# Patient Record
Sex: Male | Born: 1960 | Race: White | Hispanic: No | Smoking: Current every day smoker
Health system: Southern US, Community
[De-identification: ages and names within clinical notes are randomized; demographics above are authoritative.]

## PROBLEM LIST (undated history)

## (undated) DIAGNOSIS — G8929 Other chronic pain: Secondary | ICD-10-CM

## (undated) DIAGNOSIS — F419 Anxiety disorder, unspecified: Secondary | ICD-10-CM

## (undated) DIAGNOSIS — F4310 Post-traumatic stress disorder, unspecified: Secondary | ICD-10-CM

## (undated) DIAGNOSIS — F1290 Cannabis use, unspecified, uncomplicated: Secondary | ICD-10-CM

## (undated) DIAGNOSIS — Z9181 History of falling: Secondary | ICD-10-CM

## (undated) DIAGNOSIS — J439 Emphysema, unspecified: Secondary | ICD-10-CM

## (undated) DIAGNOSIS — R911 Solitary pulmonary nodule: Secondary | ICD-10-CM

## (undated) DIAGNOSIS — E785 Hyperlipidemia, unspecified: Secondary | ICD-10-CM

## (undated) DIAGNOSIS — N400 Enlarged prostate without lower urinary tract symptoms: Secondary | ICD-10-CM

## (undated) DIAGNOSIS — I5189 Other ill-defined heart diseases: Secondary | ICD-10-CM

## (undated) DIAGNOSIS — Z720 Tobacco use: Secondary | ICD-10-CM

## (undated) DIAGNOSIS — Z79891 Long term (current) use of opiate analgesic: Secondary | ICD-10-CM

## (undated) DIAGNOSIS — T1491XA Suicide attempt, initial encounter: Secondary | ICD-10-CM

## (undated) DIAGNOSIS — I10 Essential (primary) hypertension: Secondary | ICD-10-CM

## (undated) DIAGNOSIS — N179 Acute kidney failure, unspecified: Secondary | ICD-10-CM

## (undated) DIAGNOSIS — M549 Dorsalgia, unspecified: Secondary | ICD-10-CM

## (undated) DIAGNOSIS — C801 Malignant (primary) neoplasm, unspecified: Secondary | ICD-10-CM

## (undated) DIAGNOSIS — F32A Depression, unspecified: Secondary | ICD-10-CM

## (undated) DIAGNOSIS — I700 Atherosclerosis of aorta: Secondary | ICD-10-CM

## (undated) DIAGNOSIS — E2740 Unspecified adrenocortical insufficiency: Secondary | ICD-10-CM

## (undated) DIAGNOSIS — K649 Unspecified hemorrhoids: Secondary | ICD-10-CM

## (undated) DIAGNOSIS — E876 Hypokalemia: Secondary | ICD-10-CM

## (undated) DIAGNOSIS — K219 Gastro-esophageal reflux disease without esophagitis: Secondary | ICD-10-CM

## (undated) DIAGNOSIS — I6789 Other cerebrovascular disease: Secondary | ICD-10-CM

## (undated) DIAGNOSIS — F6381 Intermittent explosive disorder: Secondary | ICD-10-CM

## (undated) DIAGNOSIS — F329 Major depressive disorder, single episode, unspecified: Secondary | ICD-10-CM

## (undated) DIAGNOSIS — I779 Disorder of arteries and arterioles, unspecified: Secondary | ICD-10-CM

## (undated) HISTORY — PX: CHEST TUBE INSERTION: SHX231

## (undated) HISTORY — PX: LYMPH NODE BIOPSY: SHX201

## (undated) HISTORY — PX: OTHER SURGICAL HISTORY: SHX169

---

## 2006-03-07 ENCOUNTER — Emergency Department: Payer: Self-pay | Admitting: Emergency Medicine

## 2009-03-19 ENCOUNTER — Ambulatory Visit: Payer: Self-pay | Admitting: Internal Medicine

## 2009-05-12 ENCOUNTER — Emergency Department: Payer: Self-pay | Admitting: Emergency Medicine

## 2009-05-19 ENCOUNTER — Emergency Department: Payer: Self-pay | Admitting: Unknown Physician Specialty

## 2009-06-11 ENCOUNTER — Emergency Department: Payer: Self-pay | Admitting: Emergency Medicine

## 2010-09-01 ENCOUNTER — Inpatient Hospital Stay: Payer: Self-pay | Admitting: Internal Medicine

## 2010-09-09 ENCOUNTER — Emergency Department: Payer: Self-pay

## 2011-07-16 ENCOUNTER — Emergency Department: Payer: Self-pay

## 2011-07-16 LAB — COMPREHENSIVE METABOLIC PANEL
Albumin: 4.4 g/dL (ref 3.4–5.0)
Alkaline Phosphatase: 104 U/L (ref 50–136)
BUN: 10 mg/dL (ref 7–18)
Bilirubin,Total: 0.5 mg/dL (ref 0.2–1.0)
Calcium, Total: 9.5 mg/dL (ref 8.5–10.1)
Chloride: 103 mmol/L (ref 98–107)
Co2: 28 mmol/L (ref 21–32)
EGFR (African American): >60
EGFR (Non-African Amer.): >60
Osmolality: 285 (ref 275–301)
SGPT (ALT): 20 U/L
Total Protein: 7.9 g/dL (ref 6.4–8.2)

## 2011-07-16 LAB — CBC
HGB: 15.5 g/dL (ref 13.0–18.0)
MCH: 29.7 pg (ref 26.0–34.0)
MCHC: 33.2 g/dL (ref 32.0–36.0)
Platelet: 305 10*3/uL (ref 150–440)
RBC: 5.21 10*6/uL (ref 4.40–5.90)
RDW: 14.6 % — ABNORMAL HIGH (ref 11.5–14.5)
WBC: 10 10*3/uL (ref 3.8–10.6)

## 2011-07-16 LAB — LIPASE, BLOOD: Lipase: 267 U/L (ref 73–393)

## 2011-07-16 LAB — URINALYSIS, COMPLETE
Blood: NEGATIVE
Nitrite: NEGATIVE
Ph: 9 (ref 4.5–8.0)
Specific Gravity: 1.024 (ref 1.003–1.030)
WBC UR: NONE SEEN /HPF (ref 0–5)

## 2011-08-15 ENCOUNTER — Emergency Department: Payer: Self-pay | Admitting: Emergency Medicine

## 2013-09-30 ENCOUNTER — Emergency Department: Payer: Self-pay | Admitting: Emergency Medicine

## 2013-10-05 ENCOUNTER — Emergency Department: Payer: Self-pay | Admitting: Emergency Medicine

## 2013-10-27 ENCOUNTER — Emergency Department (HOSPITAL_COMMUNITY): Payer: Self-pay

## 2013-10-27 ENCOUNTER — Emergency Department (HOSPITAL_COMMUNITY)
Admission: EM | Admit: 2013-10-27 | Discharge: 2013-10-27 | Disposition: A | Discharge status: Home / Self Care | Payer: Self-pay | Attending: Emergency Medicine | Admitting: Emergency Medicine

## 2013-10-27 ENCOUNTER — Encounter (HOSPITAL_COMMUNITY): Payer: Self-pay | Admitting: Emergency Medicine

## 2013-10-27 DIAGNOSIS — W19XXXA Unspecified fall, initial encounter: Secondary | ICD-10-CM

## 2013-10-27 DIAGNOSIS — S4980XA Other specified injuries of shoulder and upper arm, unspecified arm, initial encounter: Secondary | ICD-10-CM | POA: Insufficient documentation

## 2013-10-27 DIAGNOSIS — W1789XA Other fall from one level to another, initial encounter: Secondary | ICD-10-CM | POA: Insufficient documentation

## 2013-10-27 DIAGNOSIS — S161XXA Strain of muscle, fascia and tendon at neck level, initial encounter: Secondary | ICD-10-CM

## 2013-10-27 DIAGNOSIS — IMO0002 Reserved for concepts with insufficient information to code with codable children: Secondary | ICD-10-CM | POA: Insufficient documentation

## 2013-10-27 DIAGNOSIS — S139XXA Sprain of joints and ligaments of unspecified parts of neck, initial encounter: Secondary | ICD-10-CM | POA: Insufficient documentation

## 2013-10-27 DIAGNOSIS — Z79899 Other long term (current) drug therapy: Secondary | ICD-10-CM | POA: Insufficient documentation

## 2013-10-27 DIAGNOSIS — Y93H2 Activity, gardening and landscaping: Secondary | ICD-10-CM | POA: Insufficient documentation

## 2013-10-27 DIAGNOSIS — S298XXA Other specified injuries of thorax, initial encounter: Secondary | ICD-10-CM | POA: Insufficient documentation

## 2013-10-27 DIAGNOSIS — F17200 Nicotine dependence, unspecified, uncomplicated: Secondary | ICD-10-CM | POA: Insufficient documentation

## 2013-10-27 DIAGNOSIS — M79601 Pain in right arm: Secondary | ICD-10-CM

## 2013-10-27 DIAGNOSIS — S46909A Unspecified injury of unspecified muscle, fascia and tendon at shoulder and upper arm level, unspecified arm, initial encounter: Secondary | ICD-10-CM | POA: Insufficient documentation

## 2013-10-27 DIAGNOSIS — I10 Essential (primary) hypertension: Secondary | ICD-10-CM | POA: Insufficient documentation

## 2013-10-27 DIAGNOSIS — Y9289 Other specified places as the place of occurrence of the external cause: Secondary | ICD-10-CM | POA: Insufficient documentation

## 2013-10-27 DIAGNOSIS — S335XXA Sprain of ligaments of lumbar spine, initial encounter: Secondary | ICD-10-CM | POA: Insufficient documentation

## 2013-10-27 DIAGNOSIS — S39012A Strain of muscle, fascia and tendon of lower back, initial encounter: Secondary | ICD-10-CM

## 2013-10-27 HISTORY — DX: Essential (primary) hypertension: I10

## 2013-10-27 LAB — URINALYSIS, ROUTINE W REFLEX MICROSCOPIC
BILIRUBIN URINE: NEGATIVE
GLUCOSE, UA: NEGATIVE mg/dL
HGB URINE DIPSTICK: NEGATIVE
Ketones, ur: NEGATIVE mg/dL
Leukocytes, UA: NEGATIVE
Nitrite: NEGATIVE
Protein, ur: NEGATIVE mg/dL
SPECIFIC GRAVITY, URINE: 1.026 (ref 1.005–1.030)
UROBILINOGEN UA: 0.2 mg/dL (ref 0.0–1.0)
pH: 7 (ref 5.0–8.0)

## 2013-10-27 LAB — ETHANOL: Alcohol, Ethyl (B): <11 mg/dL (ref 0–11)

## 2013-10-27 LAB — COMPREHENSIVE METABOLIC PANEL
ALK PHOS: 100 U/L (ref 39–117)
ALT: 15 U/L (ref 0–53)
AST: 23 U/L (ref 0–37)
Albumin: 3.7 g/dL (ref 3.5–5.2)
Anion gap: 14 (ref 5–15)
BUN: 13 mg/dL (ref 6–23)
CHLORIDE: 103 meq/L (ref 96–112)
CO2: 26 mEq/L (ref 19–32)
CREATININE: 0.87 mg/dL (ref 0.50–1.35)
Calcium: 9.2 mg/dL (ref 8.4–10.5)
GFR calc Af Amer: >90 mL/min (ref >90)
Glucose, Bld: 107 mg/dL — ABNORMAL HIGH (ref 70–99)
Potassium: 2.9 mEq/L — CL (ref 3.7–5.3)
Sodium: 143 mEq/L (ref 137–147)
Total Bilirubin: 0.5 mg/dL (ref 0.3–1.2)
Total Protein: 6.7 g/dL (ref 6.0–8.3)

## 2013-10-27 LAB — TROPONIN I

## 2013-10-27 LAB — CBC WITH DIFFERENTIAL/PLATELET
Basophils Absolute: 0 10*3/uL (ref 0.0–0.1)
Basophils Relative: 0 % (ref 0–1)
Eosinophils Absolute: 0.1 10*3/uL (ref 0.0–0.7)
Eosinophils Relative: 1 % (ref 0–5)
HEMATOCRIT: 42.6 % (ref 39.0–52.0)
HEMOGLOBIN: 15.1 g/dL (ref 13.0–17.0)
Lymphocytes Relative: 25 % (ref 12–46)
Lymphs Abs: 1.6 10*3/uL (ref 0.7–4.0)
MCH: 29 pg (ref 26.0–34.0)
MCHC: 35.4 g/dL (ref 30.0–36.0)
MCV: 81.9 fL (ref 78.0–100.0)
MONO ABS: 0.5 10*3/uL (ref 0.1–1.0)
MONOS PCT: 8 % (ref 3–12)
NEUTROS ABS: 4.1 10*3/uL (ref 1.7–7.7)
Neutrophils Relative %: 66 % (ref 43–77)
Platelets: 287 10*3/uL (ref 150–400)
RBC: 5.2 MIL/uL (ref 4.22–5.81)
RDW: 13.5 % (ref 11.5–15.5)
WBC: 6.3 10*3/uL (ref 4.0–10.5)

## 2013-10-27 LAB — MAGNESIUM: Magnesium: 1.9 mg/dL (ref 1.5–2.5)

## 2013-10-27 MED ORDER — POTASSIUM CHLORIDE CRYS ER 20 MEQ PO TBCR: 20.0000 meq | EXTENDED_RELEASE_TABLET | Freq: Every day | ORAL | Status: DC

## 2013-10-27 MED ORDER — MORPHINE SULFATE 4 MG/ML IJ SOLN
4.0000 mg | Freq: Once | INTRAMUSCULAR | Status: AC
  Administered 2013-10-27: 4 mg via INTRAVENOUS
  Filled 2013-10-27: qty 1

## 2013-10-27 MED ORDER — SODIUM CHLORIDE 0.9 % IV BOLUS (SEPSIS)
1000.0000 mL | Freq: Once | INTRAVENOUS | Status: AC
  Administered 2013-10-27: 1000 mL via INTRAVENOUS

## 2013-10-27 MED ORDER — OXYCODONE-ACETAMINOPHEN 5-325 MG PO TABS: 1.0000 | ORAL_TABLET | Freq: Three times a day (TID) | ORAL | Status: DC | PRN

## 2013-10-27 MED ORDER — POTASSIUM CHLORIDE CRYS ER 20 MEQ PO TBCR
40.0000 meq | EXTENDED_RELEASE_TABLET | Freq: Once | ORAL | Status: AC
  Administered 2013-10-27: 40 meq via ORAL
  Filled 2013-10-27: qty 2

## 2013-10-27 MED ORDER — HYDROMORPHONE HCL 1 MG/ML IJ SOLN
1.0000 mg | Freq: Once | INTRAMUSCULAR | Status: AC
  Administered 2013-10-27: 1 mg via INTRAVENOUS
  Filled 2013-10-27: qty 1

## 2013-10-27 MED ORDER — IOHEXOL 300 MG/ML  SOLN
100.0000 mL | Freq: Once | INTRAMUSCULAR | Status: AC | PRN
  Administered 2013-10-27: 100 mL via INTRAVENOUS

## 2013-10-27 MED ORDER — IOHEXOL 350 MG/ML SOLN
50.0000 mL | Freq: Once | INTRAVENOUS | Status: AC | PRN
  Administered 2013-10-27: 50 mL via INTRAVENOUS

## 2013-10-27 NOTE — Discharge Instructions (Signed)
Please call your doctor for a followup appointment within 24-48 hours. When you talk to your doctor please let them know that you were seen in the emergency department and have them acquire all of your records so that they can discuss the findings with you and formulate a treatment plan to fully care for your new and ongoing problems. Please call and set-up an appointment with Orthopedics Please rest and stay hydrated, please apply ice  Please avoid any physical or strenuous activity  Please take medications as prescribed - while on pain medications there is to be no drinking alcohol, driving, operating any heavy machinery. If extra please dispose in a proper manner. Please do not take any extra Tylenol with this medication for this can lead to Tylenol overdose and liver issues.  Please continue to monitor symptoms closely and if symptoms are to worsen or change (fever greater than 101, chills, sweating, nausea, vomiting, chest pain, shortness of breathe, difficulty breathing, weakness, numbness, tingling, worsening or changes to pain pattern, fall, injury, inability to control urine or bowel movements, blood in the urine, blood in the stool, headache, dizziness, visual changes) please report back to the Emergency Department immediately.   Lumbosacral Strain Lumbosacral strain is a strain of any of the parts that make up your lumbosacral vertebrae. Your lumbosacral vertebrae are the bones that make up the lower third of your backbone. Your lumbosacral vertebrae are held together by muscles and tough, fibrous tissue (ligaments).  CAUSES  A sudden blow to your back can cause lumbosacral strain. Also, anything that causes an excessive stretch of the muscles in the low back can cause this strain. This is typically seen when people exert themselves strenuously, fall, lift heavy objects, bend, or crouch repeatedly. RISK FACTORS  Physically demanding work.  Participation in pushing or pulling sports or  sports that require a sudden twist of the back (tennis, golf, baseball).  Weight lifting.  Excessive lower back curvature.  Forward-tilted pelvis.  Weak back or abdominal muscles or both.  Tight hamstrings. SIGNS AND SYMPTOMS  Lumbosacral strain may cause pain in the area of your injury or pain that moves (radiates) down your leg.  DIAGNOSIS Your health care provider can often diagnose lumbosacral strain through a physical exam. In some cases, you may need tests such as X-ray exams.  TREATMENT  Treatment for your lower back injury depends on many factors that your clinician will have to evaluate. However, most treatment will include the use of anti-inflammatory medicines. HOME CARE INSTRUCTIONS   Avoid hard physical activities (tennis, racquetball, waterskiing) if you are not in proper physical condition for it. This may aggravate or create problems.  If you have a back problem, avoid sports requiring sudden body movements. Swimming and walking are generally safer activities.  Maintain good posture.  Maintain a healthy weight.  For acute conditions, you may put ice on the injured area.  Put ice in a plastic bag.  Place a towel between your skin and the bag.  Leave the ice on for 20 minutes, 2-3 times a day.  When the low back starts healing, stretching and strengthening exercises may be recommended. SEEK MEDICAL CARE IF:  Your back pain is getting worse.  You experience severe back pain not relieved with medicines. SEEK IMMEDIATE MEDICAL CARE IF:   You have numbness, tingling, weakness, or problems with the use of your arms or legs.  There is a change in bowel or bladder control.  You have increasing pain in any area  of the body, including your belly (abdomen).  You notice shortness of breath, dizziness, or feel faint.  You feel sick to your stomach (nauseous), are throwing up (vomiting), or become sweaty.  You notice discoloration of your toes or legs, or your  feet get very cold. MAKE SURE YOU:   Understand these instructions.  Will watch your condition.  Will get help right away if you are not doing well or get worse. Document Released: 11/05/2004 Document Revised: 01/31/2013 Document Reviewed: 09/14/2012 [facility] Patient Information 2015 [place], [place]. This information is not intended to replace advice given to you by your health care provider. Make sure you discuss any questions you have with your health care provider. Cervical Strain and Sprain (Whiplash) with Rehab Cervical strain and sprain are injuries that commonly occur with "whiplash" injuries. Whiplash occurs when the neck is forcefully whipped backward or forward, such as during a motor vehicle accident or during contact sports. The muscles, ligaments, tendons, discs, and nerves of the neck are susceptible to injury when this occurs. RISK FACTORS Risk of having a whiplash injury increases if:  Osteoarthritis of the spine.  Situations that make head or neck accidents or trauma more likely.  High-risk sports (football, rugby, wrestling, hockey, auto racing, gymnastics, diving, contact karate, or boxing).  Poor strength and flexibility of the neck.  Previous neck injury.  Poor tackling technique.  Improperly fitted or padded equipment. SYMPTOMS   Pain or stiffness in the front or back of neck or both.  Symptoms may present immediately or up to 24 hours after injury.  Dizziness, headache, nausea, and vomiting.  Muscle spasm with soreness and stiffness in the neck.  Tenderness and swelling at the injury site. PREVENTION  Learn and use proper technique (avoid tackling with the head, spearing, and head-butting; use proper falling techniques to avoid landing on the head).  Warm up and stretch properly before activity.  Maintain physical fitness:  Strength, flexibility, and endurance.  Cardiovascular fitness.  Wear properly fitted and padded protective equipment,  such as padded soft collars, for participation in contact sports. PROGNOSIS  Recovery from cervical strain and sprain injuries is dependent on the extent of the injury. These injuries are usually curable in 1 week to 3 months with appropriate treatment.  RELATED COMPLICATIONS   Temporary numbness and weakness may occur if the nerve roots are damaged, and this may persist until the nerve has completely healed.  Chronic pain due to frequent recurrence of symptoms.  Prolonged healing, especially if activity is resumed too soon (before complete recovery). TREATMENT  Treatment initially involves the use of ice and medication to help reduce pain and inflammation. It is also important to perform strengthening and stretching exercises and modify activities that worsen symptoms so the injury does not get worse. These exercises may be performed at home or with a therapist. For patients who experience severe symptoms, a soft, padded collar may be recommended to be worn around the neck.  Improving your posture may help reduce symptoms. Posture improvement includes pulling your chin and abdomen in while sitting or standing. If you are sitting, sit in a firm chair with your buttocks against the back of the chair. While sleeping, try replacing your pillow with a small towel rolled to 2 inches in diameter, or use a cervical pillow or soft cervical collar. Poor sleeping positions delay healing.  For patients with nerve root damage, which causes numbness or weakness, the use of a cervical traction apparatus may be recommended. Surgery is  rarely necessary for these injuries. However, cervical strain and sprains that are present at birth (congenital) may require surgery. MEDICATION   If pain medication is necessary, nonsteroidal anti-inflammatory medications, such as aspirin and ibuprofen, or other minor pain relievers, such as acetaminophen, are often recommended.  Do not take pain medication for 7 days before  surgery.  Prescription pain relievers may be given if deemed necessary by your caregiver. Use only as directed and only as much as you need. HEAT AND COLD:   Cold treatment (icing) relieves pain and reduces inflammation. Cold treatment should be applied for 10 to 15 minutes every 2 to 3 hours for inflammation and pain and immediately after any activity that aggravates your symptoms. Use ice packs or an ice massage.  Heat treatment may be used prior to performing the stretching and strengthening activities prescribed by your caregiver, physical therapist, or athletic trainer. Use a heat pack or a warm soak. SEEK MEDICAL CARE IF:   Symptoms get worse or do not improve in 2 weeks despite treatment.  New, unexplained symptoms develop (drugs used in treatment may produce side effects). EXERCISES RANGE OF MOTION (ROM) AND STRETCHING EXERCISES - Cervical Strain and Sprain These exercises may help you when beginning to rehabilitate your injury. In order to successfully resolve your symptoms, you must improve your posture. These exercises are designed to help reduce the forward-head and rounded-shoulder posture which contributes to this condition. Your symptoms may resolve with or without further involvement from your physician, physical therapist or athletic trainer. While completing these exercises, remember:   Restoring tissue flexibility helps normal motion to return to the joints. This allows healthier, less painful movement and activity.  An effective stretch should be held for at least 20 seconds, although you may need to begin with shorter hold times for comfort.  A stretch should never be painful. You should only feel a gentle lengthening or release in the stretched tissue. STRETCH- Axial Extensors  Lie on your back on the floor. You may bend your knees for comfort. Place a rolled-up hand towel or dish towel, about 2 inches in diameter, under the part of your head that makes contact with the  floor.  Gently tuck your chin, as if trying to make a "double chin," until you feel a gentle stretch at the base of your head.  Hold __________ seconds. Repeat __________ times. Complete this exercise __________ times per day.  STRETCH - Axial Extension   Stand or sit on a firm surface. Assume a good posture: chest up, shoulders drawn back, abdominal muscles slightly tense, knees unlocked (if standing) and feet hip width apart.  Slowly retract your chin so your head slides back and your chin slightly lowers. Continue to look straight ahead.  You should feel a gentle stretch in the back of your head. Be certain not to feel an aggressive stretch since this can cause headaches later.  Hold for __________ seconds. Repeat __________ times. Complete this exercise __________ times per day. STRETCH - Cervical Side Bend   Stand or sit on a firm surface. Assume a good posture: chest up, shoulders drawn back, abdominal muscles slightly tense, knees unlocked (if standing) and feet hip width apart.  Without letting your nose or shoulders move, slowly tip your right / left ear to your shoulder until your feel a gentle stretch in the muscles on the opposite side of your neck.  Hold __________ seconds. Repeat __________ times. Complete this exercise __________ times per day. STRETCH - Cervical  Rotators   Stand or sit on a firm surface. Assume a good posture: chest up, shoulders drawn back, abdominal muscles slightly tense, knees unlocked (if standing) and feet hip width apart.  Keeping your eyes level with the ground, slowly turn your head until you feel a gentle stretch along the back and opposite side of your neck.  Hold __________ seconds. Repeat __________ times. Complete this exercise __________ times per day. RANGE OF MOTION - Neck Circles   Stand or sit on a firm surface. Assume a good posture: chest up, shoulders drawn back, abdominal muscles slightly tense, knees unlocked (if standing) and  feet hip width apart.  Gently roll your head down and around from the back of one shoulder to the back of the other. The motion should never be forced or painful.  Repeat the motion 10-20 times, or until you feel the neck muscles relax and loosen. Repeat __________ times. Complete the exercise __________ times per day. STRENGTHENING EXERCISES - Cervical Strain and Sprain These exercises may help you when beginning to rehabilitate your injury. They may resolve your symptoms with or without further involvement from your physician, physical therapist, or athletic trainer. While completing these exercises, remember:   Muscles can gain both the endurance and the strength needed for everyday activities through controlled exercises.  Complete these exercises as instructed by your physician, physical therapist, or athletic trainer. Progress the resistance and repetitions only as guided.  You may experience muscle soreness or fatigue, but the pain or discomfort you are trying to eliminate should never worsen during these exercises. If this pain does worsen, stop and make certain you are following the directions exactly. If the pain is still present after adjustments, discontinue the exercise until you can discuss the trouble with your clinician. STRENGTH - Cervical Flexors, Isometric  Face a wall, standing about 6 inches away. Place a small pillow, a ball about 6-8 inches in diameter, or a folded towel between your forehead and the wall.  Slightly tuck your chin and gently push your forehead into the soft object. Push only with mild to moderate intensity, building up tension gradually. Keep your jaw and forehead relaxed.  Hold 10 to 20 seconds. Keep your breathing relaxed.  Release the tension slowly. Relax your neck muscles completely before you start the next repetition. Repeat __________ times. Complete this exercise __________ times per day. STRENGTH- Cervical Lateral Flexors, Isometric   Stand  about 6 inches away from a wall. Place a small pillow, a ball about 6-8 inches in diameter, or a folded towel between the side of your head and the wall.  Slightly tuck your chin and gently tilt your head into the soft object. Push only with mild to moderate intensity, building up tension gradually. Keep your jaw and forehead relaxed.  Hold 10 to 20 seconds. Keep your breathing relaxed.  Release the tension slowly. Relax your neck muscles completely before you start the next repetition. Repeat __________ times. Complete this exercise __________ times per day. STRENGTH - Cervical Extensors, Isometric   Stand about 6 inches away from a wall. Place a small pillow, a ball about 6-8 inches in diameter, or a folded towel between the back of your head and the wall.  Slightly tuck your chin and gently tilt your head back into the soft object. Push only with mild to moderate intensity, building up tension gradually. Keep your jaw and forehead relaxed.  Hold 10 to 20 seconds. Keep your breathing relaxed.  Release the tension slowly.  Relax your neck muscles completely before you start the next repetition. Repeat __________ times. Complete this exercise __________ times per day. POSTURE AND BODY MECHANICS CONSIDERATIONS - Cervical Strain and Sprain Keeping correct posture when sitting, standing or completing your activities will reduce the stress put on different body tissues, allowing injured tissues a chance to heal and limiting painful experiences. The following are general guidelines for improved posture. Your physician or physical therapist will provide you with any instructions specific to your needs. While reading these guidelines, remember:  The exercises prescribed by your provider will help you have the flexibility and strength to maintain correct postures.  The correct posture provides the optimal environment for your joints to work. All of your joints have less wear and tear when properly  supported by a spine with good posture. This means you will experience a healthier, less painful body.  Correct posture must be practiced with all of your activities, especially prolonged sitting and standing. Correct posture is as important when doing repetitive low-stress activities (typing) as it is when doing a single heavy-load activity (lifting). PROLONGED STANDING WHILE SLIGHTLY LEANING FORWARD When completing a task that requires you to lean forward while standing in one place for a long time, place either foot up on a stationary 2- to 4-inch high object to help maintain the best posture. When both feet are on the ground, the low back tends to lose its slight inward curve. If this curve flattens (or becomes too large), then the back and your other joints will experience too much stress, fatigue more quickly, and can cause pain.  RESTING POSITIONS Consider which positions are most painful for you when choosing a resting position. If you have pain with flexion-based activities (sitting, bending, stooping, squatting), choose a position that allows you to rest in a less flexed posture. You would want to avoid curling into a fetal position on your side. If your pain worsens with extension-based activities (prolonged standing, working overhead), avoid resting in an extended position such as sleeping on your stomach. Most people will find more comfort when they rest with their spine in a more neutral position, neither too rounded nor too arched. Lying on a non-sagging bed on your side with a pillow between your knees, or on your back with a pillow under your knees will often provide some relief. Keep in mind, being in any one position for a prolonged period of time, no matter how correct your posture, can still lead to stiffness. WALKING Walk with an upright posture. Your ears, shoulders, and hips should all line up. OFFICE WORK When working at a desk, create an environment that supports good, upright  posture. Without extra support, muscles fatigue and lead to excessive strain on joints and other tissues. CHAIR:  A chair should be able to slide under your desk when your back makes contact with the back of the chair. This allows you to work closely.  The chair's height should allow your eyes to be level with the upper part of your monitor and your hands to be slightly lower than your elbows.  Body position:  Your feet should make contact with the floor. If this is not possible, use a foot rest.  Keep your ears over your shoulders. This will reduce stress on your neck and low back. Document Released: 01/26/2005 Document Revised: 06/12/2013 Document Reviewed: 05/10/2008 [facility] Patient Information 2015 [place], [place]. This information is not intended to replace advice given to you by your health care provider. Make  sure you discuss any questions you have with your health care provider.   Emergency Department Resource Guide 1) Find a Doctor and Pay Out of Pocket Although you won't have to find out who is covered by your insurance plan, it is a good idea to ask around and get recommendations. You will then need to call the office and see if the doctor you have chosen will accept you as a new patient and what types of options they offer for patients who are self-pay. Some doctors offer discounts or will set up payment plans for their patients who do not have insurance, but you will need to ask so you aren't surprised when you get to your appointment.  2) Contact Your Local Health Department Not all health departments have doctors that can see patients for sick visits, but many do, so it is worth a call to see if yours does. If you don't know where your local health department is, you can check in your phone book. The CDC also has a tool to help you locate your state's health department, and many state websites also have listings of all of their local health departments.  3) Find a [facility] If your illness is not likely to be very severe or complicated, you may want to try a walk in clinic. These are popping up all over the country in pharmacies, drugstores, and shopping centers. They're usually staffed by nurse practitioners or physician assistants that have been trained to treat common illnesses and complaints. They're usually fairly quick and inexpensive. However, if you have serious medical issues or chronic medical problems, these are probably not your best option.  No Primary Care Doctor: - Call [facility] Connect at  [phone] - they can help you locate a primary care doctor that  accepts your insurance, provides certain services, etc. - Physician Referral Service- [phone]  Chronic Pain Problems: Organization         Address  Phone   Notes  [facility]  [phone] Patients need to be referred by their primary care doctor.   Medication Assistance: Organization         Address  Phone   Notes  [facility] Medication [facility] [address] [place], [place] [address] [phone] --Must be a resident of [facility] -- Must have NO insurance coverage whatsoever (no Medicaid/ Medicare, etc.) -- The pt. MUST have a primary care doctor that directs their care regularly and follows them in the community   [facility]  [phone]   [employer]  [phone]    Agencies that provide inexpensive medical care: Organization         Address  Phone   Notes  [place]  [phone]   [facility]    [phone]   [facility] [place], [place] [address] [phone]   [place] [address], [place] [phone]   [facility]    [phone]   [facility]    [phone]   [place] [address] [place] [address], [place] Phone:  [phone], Fax:  [phone] Hours  of Operation:  9 am - 6 pm, M-F.  Also accepts Medicaid/Medicare and self-pay.  [facility] for [place], [address], [place] Phone: [phone], Fax: [phone]. Hours of Operation:  8:30 am -  5:30 pm, M-F.  Also accepts Medicaid and self-pay.  [facility] [place] [address], [place] Phone: [phone]   [place], [place], [place] [phone], [address]. [phone] Mondays & Thursdays: 7-9 AM.  First 15 patients are seen on a first come, first serve basis.    [place] Providers:  Organization         Address  Phone   Notes  [facility] [address], [place] [phone] Also accepts self-pay patients.  [facility] [address], [place]  [phone]   [place], [address], [place] [phone]   [facility] [address], [place] [phone]   Lucianne Lei [address], [place]   [phone] Only accepts [place] patients after they have their name applied to their card.   Self-Pay (no insurance) in [facility]:  Organization         Address  Phone   Notes  Sickle Cell Patients, [facility] [place] [phone]   [facility] [place] [phone]   [facility] [place]  [place], [place], [place] [phone]   [facility]/Dr. Osei-Bonsu  [address], [place] or [address], [place] [phone] Phone number for both [place] and [place] locations is the same.  [facility] [address] [place] [phone]   [facility] [address], [place] or [address] [phone]   [facility] [address] [place] [phone], phone; [phone], fax Sees patients 1st and 3rd Saturday of every month.  Must not qualify for public or private insurance (i.e. Medicaid, Medicare, [place] Health Choice, Veterans' Benefits)  Household income should be no more than 200% of the poverty level The clinic cannot treat you if you are pregnant or think you are pregnant  Sexually transmitted diseases are not treated at the clinic.    Dental Care: Organization         Address  Phone  Notes  [facility] Department of [facility] [place] [phone] Accepts children up to age 41 who are enrolled in [place] or [place]; pregnant women with a Medicaid card; and children who have applied for Medicaid or [place] Health Choice, but were declined, whose parents can pay a reduced fee at time of service.  [facility] [facility]  [address], [place] [phone] Accepts children up to age 19 who are enrolled in [place] or [place]; pregnant women with a Medicaid card; and children who have applied for Medicaid or [place] Health Choice, but were declined, whose parents can pay a reduced fee at time of service.  [place] Adult Dental Access PROGRAM  [place] [phone] Patients are seen by appointment only. Walk-ins are not accepted. [place] will see patients 42 years of age and older. Monday - Tuesday (8am-5pm) Most Wednesdays (8:30-5pm) $30 per visit, cash only  [facility] Adult Dental Access PROGRAM  [address], [facility] [phone] Patients are seen by appointment only. Walk-ins are not  accepted. [place] will see patients 62 years of age and older. One Wednesday Evening (Monthly: Volunteer Based).  $30 per visit, cash only  [place]  [phone] for adults; Children under age 19, call Graduate  Pediatric Dentistry at [phone]. Children aged 45-14, please call [phone] to request a pediatric application.  Dental services are provided in all areas of dental care including fillings, crowns and bridges, complete and partial dentures, implants, gum treatment, root canals, and extractions. Preventive care is also provided. Treatment is provided to both adults and children. Patients are selected via a lottery and there is often a waiting list.   [facility] [address], [place]  [phone] [email or web address]   [facility] [address] [place], [place] [phone], Ext. [phone] Second and Fourth Thursday of each month, opens at 6:30 AM; Clinic ends at 9 AM.  Patients are seen on a first-come first-served basis, and a limited number are seen during each clinic.   [facility]  [address] Hillard Danker [place], [place] [phone]   Eligibility Requirements You must have lived in [place], [place], or [place] counties for at least the last three months.   You cannot be eligible for state or federal sponsored [employer], including [employer], [place], or [employer].   You generally cannot be eligible for healthcare insurance through your employer.    How to apply: Eligibility screenings are held every Tuesday and Wednesday afternoon from 1:00 pm until 4:00 pm. You do not need an appointment for the interview!  [facility] [address], [place], [place]   [place]  [place] Department  [place]  [phone]    Behavioral Health Resources in the Community: Intensive Outpatient Programs Organization         Address  Phone  Notes  [place]. [address] [place], [place] [phone]   [facility] Outpatient [address], [place], [place]   ADS: [facility] [address], [place], [place]   [place] [address],  [place], [place] or [phone]   Substance Abuse Resources Organization         Address  Phone  Notes  [facility]  [phone]   [place]  [phone]   [facility]   Chinita Pester  [phone]   Residential & Outpatient Substance Abuse Program  [phone]   Psychological Services Organization         Address  Phone  Notes  [facility]  [place]  [phone]   [facility] [address] [place] or [phone]    Mobile Crisis Teams Organization         Address  Phone  Notes  [facility], Mobile Crisis Care Unit  [phone]   [facility]  [address] [place], [place]   [place] [address] [place] [phone]    Self-Help/Support Groups Organization         Address  Phone             Notes  [place]. of [place] - variety of support groups  [place] Call for more information  Narcotics Anonymous (NA), Caring  Services [address], [employer] [place]  2 meetings at this location   Residential Facilities manager         Address  Phone  Notes  [facility] [place],    [place]  [phone]   [facility]  [address] [place] [id number], [place], [place]   [place], [place] [phone] Admissions: 8am-3pm M-F  Incentives Substance [place] [address],    [place], [place] [phone]   [facility] [address] [place], [place], [place]   [facility] [address],  [place], [place]   [facility] - Intensive Outpatient [address], [place], [place]   [facility] [place]  [place], [place] [phone] or [phone]   [facility] ([facility]) [address], [place], [place] Accepts Medicaid  [facility] [place] [address],  [place] [phone] Substance Abuse/Addiction Treatment   [facility] Organization         Address  Phone  Notes  [facility]  [phone]   Domenic Schwab, PhD [address] [place]lis [place], [place]   [phone] or [phone]   Helix Matheny [place], [place] [phone]   [facility] [address] [place], [place] [phone] Insurance/Medicaid/sponsorship through [facility] [facility] and Families [address]                                    [place], [place] [phone] [place] [address], [place] [phone]    Dr. Adele Schilder  [phone]   [facility] Dept. 1) [address] [place] 2) [place] 3)  [place] [address], [place] [phone]  [phone]   [place] [phone] or [phone] (After Hours)

## 2013-10-27 NOTE — ED Notes (Signed)
Pt ambulates well with minimal assistance.  

## 2013-10-27 NOTE — ED Notes (Signed)
CRITICAL VALUE ALERT  Critical value received: k 2.9  Date of notification:10/27/2013  Time of notification:  0712  Critical value read back:Yes.    Nurse who received alert:  Leodis Rains  MD notified (1st page):  PA  Marissa s.  Time of first page:  1316

## 2013-10-27 NOTE — ED Notes (Signed)
Per EMS- pt was in a tree cutting limb when he cut the limb he was tied too. Pt states that he was wearing a waist harness. Pt states that he fell approx 12-14 feet. Landing on his neck and shoulders. Pt states that he feels a "needle" sensation in his rt hand.

## 2013-10-27 NOTE — ED Provider Notes (Signed)
Care assumed from the previous provider. Please refer to previous notes for full details of care including the history of physical. Fall 12-14 feet. Cutting branches. Trauma scans negative. Pain control and then dc home.   Patient's pain was appropriately controlled and he was requesting to be dc'd home. Dc'd without any events.  Strong return precautions given for worsening symptoms or any other alarming or concerning symptoms or issues. The patient was in agreement with the treatment plan and I answered all of their questions. The patient was stable for dc. At dc, the patient ambulated without difficulty, was moving all four extremities, symptoms improved, NAD. and AOx4 Care discussed with my attending, Dr. Vanita Panda. If performed and available, imaging studies and labs reviewed.   Kelby Aline, MD 10/27/13 [phone]

## 2013-10-27 NOTE — ED Notes (Signed)
Pt remains in Radiology at this time.

## 2013-10-27 NOTE — ED Provider Notes (Signed)
CSN: [id number]     Arrival date & time 10/27/13  1148 History   First MD Initiated Contact with Patient 10/27/13 1204     Chief Complaint  Patient presents with  . Fall     (Consider location/radiation/quality/duration/timing/severity/associated sxs/prior Treatment) The history is provided by the patient. No language interpreter was used.  Alvin Gonzalez is a 53 year old male with past medical history of hypertension presenting to the ED with a fall that occurred shortly prior to arrival to the ED. Patient reported that he was trimming branches on a tree where he fell approximately 12-14 feet from the tree. Stated that when he landed he landed on the ground on his right shoulder and has been experiencing right shoulder pain and neck pain. Stated that he has mild tingling in his right hand. Denied hitting any branches on the way down. Stated that he's been experiencing lower back pain as well. Denied head injury, loss of consciousness, chest pain, shortness of breath, difficulty breathing, abdominal pain, nausea, vomiting, weakness. Stated that he is up to date with his vaccinations-reported his last tetanus shot was in 2013 PCP none  Past Medical History  Diagnosis Date  . Hypertension    Past Surgical History  Procedure Laterality Date  . Lymph node biopsy     No family history on file. History  Substance Use Topics  . Smoking status: Current Every Day Smoker -- 0.50 packs/day    Types: Cigarettes  . Smokeless tobacco: Not on file  . Alcohol Use: Yes     Comment: occassional last night    Review of Systems  Eyes: Negative for visual disturbance.  Respiratory: Negative for chest tightness and shortness of breath.   Cardiovascular: Negative for chest pain.  Gastrointestinal: Negative for nausea, vomiting and abdominal pain.  Musculoskeletal: Positive for arthralgias (right arm ), back pain and neck pain.  Neurological: Positive for numbness (right hand ). Negative for weakness and  headaches.      Allergies  Ibuprofen and Tegretol  Home Medications   Prior to Admission medications   Medication Sig Start Date End Date Taking? Authorizing Provider  amitriptyline (ELAVIL) 75 MG tablet Take 75 mg by mouth at bedtime.   Yes Historical Provider, MD  amLODipine (NORVASC) 10 MG tablet Take 10 mg by mouth daily.   Yes Historical Provider, MD  hydrochlorothiazide (HYDRODIURIL) 50 MG tablet Take 50 mg by mouth daily.   Yes Historical Provider, MD   BP 141/91  Pulse 91  Temp(Src) 98.3 F (36.8 C) (Oral)  Resp 18  SpO2 99% Physical Exam  Nursing note and vitals reviewed. Constitutional: He is oriented to person, place, and time. He appears well-developed and well-nourished. No distress.  HENT:  Head: Normocephalic. Not macrocephalic and not microcephalic. Head is with laceration (Superficial abrasion to the left zygomatic arch). Head is without raccoon's eyes, without Battle's sign and without abrasion.    Mouth/Throat: Oropharynx is clear and moist. No oropharyngeal exudate.  Negative hematomas Negative crepitus or depressions noted to the skull upon palpation Superficial abrasion identified to the left zygomatic arch Negative trismus Negative damage noted to dentition  Eyes: Conjunctivae and EOM are normal. Pupils are equal, round, and reactive to light. Right eye exhibits no discharge. Left eye exhibits no discharge.  EOMs intact Negative nystagmus Negative signs of entrapment Visual fields grossly intact  Neck:  Patient in c-collar  Cardiovascular: Normal rate, regular rhythm and normal heart sounds.  Exam reveals no friction rub.   No murmur heard.  Pulmonary/Chest: Effort normal and breath sounds normal. No respiratory distress. He has no wheezes. He has no rales. He exhibits tenderness.    Patient is able to speak in full sentences without difficulty Negative use of accessory muscles Negative stridor Negative tenting of the clavicles  bilaterally  Discomfort upon palpation to the right side of the chest-negative crepitus upon palpation.  Abdominal: Soft. Bowel sounds are normal. He exhibits no distension. There is no tenderness. There is no rebound and no guarding.  Negative abdominal distention Bowel sounds normoactive in all 4 quadrants Abdomen soft upon palpation Negative pain upon palpation to the abdomen Negative peritoneal signs  Musculoskeletal: Normal range of motion. He exhibits tenderness. He exhibits no edema.       Back:       Arms: Negative deformities identified to the spine. Discomfort upon palpation to the thoracic and lumbar spine mid spinal and paravertebral regions bilaterally, lumbar more so than thoracic. Negative swelling, erythema, inflammation, lesions, sores, deformities, malalignment identified to the right shoulder. Discomfort upon palpation to the anterior posterior aspect of the right shoulder and humeral region. Full range of motion noted to the right elbow and right hand without difficulty or ataxia. Negative deformities identified to lower extremities. Patient able to flex and extend the knees bilaterally without difficulty or ataxia. Full range of motion to the digits of the feet bilaterally.  Neurological: He is alert and oriented to person, place, and time. No cranial nerve deficit. He exhibits normal muscle tone. Coordination normal.  Cranial nerves III-XII grossly intact Strength 5+/5+ to upper and lower extremities bilaterally with resistance applied, equal distribution noted Equal grip strength bilaterally Sensation intact with differentiation to sharp and dull touch Negative facial droop Negative slurred speech Negative aphasia GCS 15 Negative saddle paresthesias bilaterally Strength intact to MCP, PIP, DIP joints of right hand Negative arm drift Fine motor skills intact  Skin: Skin is warm and dry. No rash noted. He is not diaphoretic. No erythema.  Psychiatric: He has a  normal mood and affect. His behavior is normal. Thought content normal.    ED Course  Procedures (including critical care time)  4:16 PM Patient seen and assessed by attending physician, Dr. Kirby Funk.   Results for orders placed during the hospital encounter of 10/27/13  CBC WITH DIFFERENTIAL      Result Value Ref Range   WBC 6.3  4.0 - 10.5 K/uL   RBC 5.20  4.22 - 5.81 MIL/uL   Hemoglobin 15.1  13.0 - 17.0 g/dL   HCT 42.6  39.0 - 52.0 %   MCV 81.9  78.0 - 100.0 fL   MCH 29.0  26.0 - 34.0 pg   MCHC 35.4  30.0 - 36.0 g/dL   RDW 13.5  11.5 - 15.5 %   Platelets 287  150 - 400 K/uL   Neutrophils Relative % 66  43 - 77 %   Neutro Abs 4.1  1.7 - 7.7 K/uL   Lymphocytes Relative 25  12 - 46 %   Lymphs Abs 1.6  0.7 - 4.0 K/uL   Monocytes Relative 8  3 - 12 %   Monocytes Absolute 0.5  0.1 - 1.0 K/uL   Eosinophils Relative 1  0 - 5 %   Eosinophils Absolute 0.1  0.0 - 0.7 K/uL   Basophils Relative 0  0 - 1 %   Basophils Absolute 0.0  0.0 - 0.1 K/uL  COMPREHENSIVE METABOLIC PANEL      Result Value Ref Range  Sodium 143  137 - 147 mEq/L   Potassium 2.9 (*) 3.7 - 5.3 mEq/L   Chloride 103  96 - 112 mEq/L   CO2 26  19 - 32 mEq/L   Glucose, Bld 107 (*) 70 - 99 mg/dL   BUN 13  6 - 23 mg/dL   Creatinine, Ser 0.87  0.50 - 1.35 mg/dL   Calcium 9.2  8.4 - 10.5 mg/dL   Total Protein 6.7  6.0 - 8.3 g/dL   Albumin 3.7  3.5 - 5.2 g/dL   AST 23  0 - 37 U/L   ALT 15  0 - 53 U/L   Alkaline Phosphatase 100  39 - 117 U/L   Total Bilirubin 0.5  0.3 - 1.2 mg/dL   GFR calc non Af Amer >90  >90 mL/min   GFR calc Af Amer >90  >90 mL/min   Anion gap 14  5 - 15  TROPONIN I      Result Value Ref Range   Troponin I <0.30  <0.30 ng/mL  ETHANOL      Result Value Ref Range   Alcohol, Ethyl (B) <11  0 - 11 mg/dL  URINALYSIS, ROUTINE W REFLEX MICROSCOPIC      Result Value Ref Range   Color, Urine YELLOW  YELLOW   APPearance CLEAR  CLEAR   Specific Gravity, Urine 1.026  1.005 - 1.030   pH 7.0  5.0 -  8.0   Glucose, UA NEGATIVE  NEGATIVE mg/dL   Hgb urine dipstick NEGATIVE  NEGATIVE   Bilirubin Urine NEGATIVE  NEGATIVE   Ketones, ur NEGATIVE  NEGATIVE mg/dL   Protein, ur NEGATIVE  NEGATIVE mg/dL   Urobilinogen, UA 0.2  0.0 - 1.0 mg/dL   Nitrite NEGATIVE  NEGATIVE   Leukocytes, UA NEGATIVE  NEGATIVE  MAGNESIUM      Result Value Ref Range   Magnesium 1.9  1.5 - 2.5 mg/dL    Labs Review Labs Reviewed  COMPREHENSIVE METABOLIC PANEL - Abnormal; Notable for the following:    Potassium 2.9 (*)    Glucose, Bld 107 (*)    All other components within normal limits  CBC WITH DIFFERENTIAL  TROPONIN I  ETHANOL  URINALYSIS, ROUTINE W REFLEX MICROSCOPIC  MAGNESIUM    Imaging Review Dg Shoulder Right  10/27/2013   CLINICAL DATA:  Pain post trauma  EXAM: RIGHT SHOULDER - 2+ VIEW  COMPARISON:  None.  FINDINGS: Frontal, Y scapular, and axillary images were obtained. No fracture or dislocation. Joint spaces appear intact. No erosive change.  IMPRESSION: No fracture or appreciable arthropathy.   Electronically Signed   By: Lowella Grip M.D.   On: 10/27/2013 14:02   Dg Elbow Complete Right  10/27/2013   CLINICAL DATA:  [place] 15 feet  EXAM: RIGHT ELBOW - COMPLETE 3+ VIEW  COMPARISON:  None.  FINDINGS: There is no evidence of fracture, dislocation, or joint effusion. There is no evidence of arthropathy or other focal bone abnormality. Soft tissues are unremarkable.  IMPRESSION: Negative.   Electronically Signed   By: Franchot Gallo M.D.   On: 10/27/2013 14:02   Ct Head Wo Contrast  10/27/2013   CLINICAL DATA:  Pain post trauma  EXAM: CT HEAD WITHOUT CONTRAST  CT CERVICAL SPINE WITHOUT CONTRAST  TECHNIQUE: Multidetector CT imaging of the head and cervical spine was performed following the standard protocol without intravenous contrast. Multiplanar CT image reconstructions of the cervical spine were also generated.  COMPARISON:  None.  FINDINGS: CT HEAD  FINDINGS  The ventricles are normal in size and  configuration. There is no mass, hemorrhage, extra-axial fluid collection, or midline shift. The gray-white compartments appear normal. The bony calvarium appears intact. The mastoid air cells clear. There is rightward deviation of the nasal septum.  CT CERVICAL SPINE FINDINGS  There is no fracture or spondylolisthesis. Prevertebral soft tissues and predental space regions are normal. There is moderately severe disc space narrowing at C5-6. There is moderate disc space narrowing at C4-5. There is slightly milder disc space narrowing at C3-4 and C6-7. There are prominent anterior osteophytes at C3, C4, C5, and C6. There are posterior osteophytes at these levels as well. There is calcification in the posterior longitudinal ligament at C4-5. There is calcification in the anterior longitudinal ligament at C3-4. There is multilevel facet hypertrophy. No disc extrusion or stenosis. There is exit foraminal narrowing due to osteophytic change at C3-4 on the right, C4-5 bilaterally, more on the right than on the left, C5-6 bilaterally, and C6-7 bilaterally.  IMPRESSION: CT head: No intracranial mass, hemorrhage, or extra-axial fluid collection. There is deviation of the nasal septum toward the right.  CT cervical spine: Multilevel osteoarthritic change. No fracture or spondylolisthesis.   Electronically Signed   By: Lowella Grip M.D.   On: 10/27/2013 14:21   Ct Chest W Contrast  10/27/2013   CLINICAL DATA:  Patient was cutting a limb in a tree. Patient was tied to the Limb he was cutting and fell with the limb approximately 12-14 feet landing on neck and shoulders.  EXAM: CT CHEST, ABDOMEN, AND PELVIS WITH CONTRAST  TECHNIQUE: Multidetector CT imaging of the chest, abdomen and pelvis was performed following the standard protocol during bolus administration of intravenous contrast.  CONTRAST:  184mL OMNIPAQUE IOHEXOL 300 MG/ML  SOLN  COMPARISON:  None.  FINDINGS: CT CHEST FINDINGS  Thoracic inlet is unremarkable.  Normal heart and great vessels. No mediastinal hematoma. No mediastinal or hilar masses or adenopathy.  Mild dependent subsegmental atelectasis mostly in the lower lobes. No lung contusion or laceration. No consolidation or edema. No pleural effusion or pneumothorax.  CT ABDOMEN AND PELVIS FINDINGS  Liver, spleen, gallbladder, pancreas, adrenal glands, kidneys, ureters, bladder: Normal.  No ascites or hemo peritoneum. No evidence of a mesenteric hematoma. Colon and small bowel are unremarkable. Normal appendix.  No adenopathy. No evidence of a vascular injury. There are atherosclerotic calcifications along the infrarenal abdominal aorta and common iliac arteries.  MUSCULOSKELETAL:  No fractures. There are degenerative changes throughout the visualized spine. No osteoblastic or osteolytic lesions.  IMPRESSION: 1. No acute findings. No evidence of acute injury to the chest, abdomen or pelvis. No fractures.   Electronically Signed   By: Lajean Manes M.D.   On: 10/27/2013 15:04   Ct Cervical Spine Wo Contrast  10/27/2013   CLINICAL DATA:  Pain post trauma  EXAM: CT HEAD WITHOUT CONTRAST  CT CERVICAL SPINE WITHOUT CONTRAST  TECHNIQUE: Multidetector CT imaging of the head and cervical spine was performed following the standard protocol without intravenous contrast. Multiplanar CT image reconstructions of the cervical spine were also generated.  COMPARISON:  None.  FINDINGS: CT HEAD FINDINGS  The ventricles are normal in size and configuration. There is no mass, hemorrhage, extra-axial fluid collection, or midline shift. The gray-white compartments appear normal. The bony calvarium appears intact. The mastoid air cells clear. There is rightward deviation of the nasal septum.  CT CERVICAL SPINE FINDINGS  There is no fracture or spondylolisthesis. Prevertebral soft tissues  and predental space regions are normal. There is moderately severe disc space narrowing at C5-6. There is moderate disc space narrowing at C4-5. There  is slightly milder disc space narrowing at C3-4 and C6-7. There are prominent anterior osteophytes at C3, C4, C5, and C6. There are posterior osteophytes at these levels as well. There is calcification in the posterior longitudinal ligament at C4-5. There is calcification in the anterior longitudinal ligament at C3-4. There is multilevel facet hypertrophy. No disc extrusion or stenosis. There is exit foraminal narrowing due to osteophytic change at C3-4 on the right, C4-5 bilaterally, more on the right than on the left, C5-6 bilaterally, and C6-7 bilaterally.  IMPRESSION: CT head: No intracranial mass, hemorrhage, or extra-axial fluid collection. There is deviation of the nasal septum toward the right.  CT cervical spine: Multilevel osteoarthritic change. No fracture or spondylolisthesis.   Electronically Signed   By: Lowella Grip M.D.   On: 10/27/2013 14:21   Ct Abdomen Pelvis W Contrast  10/27/2013   CLINICAL DATA:  Patient was cutting a limb in a tree. Patient was tied to the Limb he was cutting and fell with the limb approximately 12-14 feet landing on neck and shoulders.  EXAM: CT CHEST, ABDOMEN, AND PELVIS WITH CONTRAST  TECHNIQUE: Multidetector CT imaging of the chest, abdomen and pelvis was performed following the standard protocol during bolus administration of intravenous contrast.  CONTRAST:  16mL OMNIPAQUE IOHEXOL 300 MG/ML  SOLN  COMPARISON:  None.  FINDINGS: CT CHEST FINDINGS  Thoracic inlet is unremarkable. Normal heart and great vessels. No mediastinal hematoma. No mediastinal or hilar masses or adenopathy.  Mild dependent subsegmental atelectasis mostly in the lower lobes. No lung contusion or laceration. No consolidation or edema. No pleural effusion or pneumothorax.  CT ABDOMEN AND PELVIS FINDINGS  Liver, spleen, gallbladder, pancreas, adrenal glands, kidneys, ureters, bladder: Normal.  No ascites or hemo peritoneum. No evidence of a mesenteric hematoma. Colon and small bowel are  unremarkable. Normal appendix.  No adenopathy. No evidence of a vascular injury. There are atherosclerotic calcifications along the infrarenal abdominal aorta and common iliac arteries.  MUSCULOSKELETAL:  No fractures. There are degenerative changes throughout the visualized spine. No osteoblastic or osteolytic lesions.  IMPRESSION: 1. No acute findings. No evidence of acute injury to the chest, abdomen or pelvis. No fractures.   Electronically Signed   By: Lajean Manes M.D.   On: 10/27/2013 15:04   Dg Hand Complete Right  10/27/2013   CLINICAL DATA:  Patient fell 15 feet from a tree. Generalized tenderness  EXAM: RIGHT HAND - COMPLETE 3+ VIEW  COMPARISON:  None.  FINDINGS: There is no evidence of fracture or dislocation. There is no evidence of arthropathy or other focal bone abnormality. Soft tissues are unremarkable.  IMPRESSION: Negative.   Electronically Signed   By: Kathreen Devoid   On: 10/27/2013 14:02     EKG Interpretation   Date/Time:  Friday October 27 2013 12:29:19 EDT Ventricular Rate:  93 PR Interval:  171 QRS Duration: 92 QT Interval:  392 QTC Calculation: 488 R Axis:   62 Text Interpretation:  Sinus rhythm Borderline prolonged QT interval Sinus  rhythm qtp, borderline Borderline ECG Confirmed by Carmin Muskrat  MD  ([id number]) on 10/27/2013 3:49:32 PM      MDM   Final diagnoses:  None    Medications  morphine 4 MG/ML injection 4 mg (4 mg Intravenous Given 10/27/13 1411)  sodium chloride 0.9 % bolus 1,000 mL (0 mLs Intravenous Stopped  10/27/13 1451)  iohexol (OMNIPAQUE) 300 MG/ML solution 100 mL (100 mLs Intravenous Contrast Given 10/27/13 1344)  iohexol (OMNIPAQUE) 350 MG/ML injection 50 mL (50 mLs Intravenous Contrast Given 10/27/13 1600)  HYDROmorphone (DILAUDID) injection 1 mg (1 mg Intravenous Given 10/27/13 1627)  potassium chloride SA (K-DUR,KLOR-CON) CR tablet 40 mEq (40 mEq Oral Given 10/27/13 1627)   Filed Vitals:   10/27/13 1445 10/27/13 1500 10/27/13 1515  10/27/13 1530  BP: 134/83 135/79 165/80 141/91  Pulse: 92 92 93 91  Temp:      TempSrc:      Resp:      SpO2: 99% 99% 99% 99%   EKG sinus rhythm with a heart rate of 93 beats per minute, borderline prolonged QT interval. Troponin negative elevation. CBC unremarkable. CMP noted hypokalemia of 2.9. Negative elevated ethanol level. Magnesium within normal limits. Urinalysis unremarkable-negative findings of hemoglobin. CT head negative for intracranial mass, hemorrhage or extra-axial fluid-negative findings the skull fracture. CT cervical spine negative for acute osseous injury. CT chest with contrast negative for acute abnormalities are dramatic event. CT abdomen and pelvis with contrast negative for acute traumatic injuries. CT angio of the cervical spine negative for acute traumatic injury to the cervical carotids or vertebral arteries. Plain film of right shoulder, right elbow, right hand negative for acute osseous injury. Pain medication given in ED setting. Patient ambulated well with negative step offs or sway noted-patient able to apply pressure to the legs bilaterally. Negative focal neurological deficits noted. Imaging unremarkable. Patient seen and assessed by attending physician, Dr. Vanita Panda. Potassium administered-will need to recheck prior to discharge. Discussed case with ED resident, Dr. Toma Aran. Transfer of care to Dr. Toma Aran at change in shift.   Jamse Mead, PA-C 10/27/13 1734

## 2013-10-29 ENCOUNTER — Emergency Department (HOSPITAL_COMMUNITY)
Admission: EM | Admit: 2013-10-29 | Discharge: 2013-10-29 | Disposition: A | Discharge status: Home / Self Care | Payer: Self-pay | Attending: Emergency Medicine | Admitting: Emergency Medicine

## 2013-10-29 ENCOUNTER — Encounter (HOSPITAL_COMMUNITY): Payer: Self-pay | Admitting: Emergency Medicine

## 2013-10-29 DIAGNOSIS — F17200 Nicotine dependence, unspecified, uncomplicated: Secondary | ICD-10-CM | POA: Insufficient documentation

## 2013-10-29 DIAGNOSIS — G8929 Other chronic pain: Secondary | ICD-10-CM | POA: Insufficient documentation

## 2013-10-29 DIAGNOSIS — M542 Cervicalgia: Secondary | ICD-10-CM | POA: Insufficient documentation

## 2013-10-29 DIAGNOSIS — M549 Dorsalgia, unspecified: Secondary | ICD-10-CM | POA: Insufficient documentation

## 2013-10-29 DIAGNOSIS — I10 Essential (primary) hypertension: Secondary | ICD-10-CM | POA: Insufficient documentation

## 2013-10-29 DIAGNOSIS — F0781 Postconcussional syndrome: Secondary | ICD-10-CM | POA: Insufficient documentation

## 2013-10-29 DIAGNOSIS — G8911 Acute pain due to trauma: Secondary | ICD-10-CM | POA: Insufficient documentation

## 2013-10-29 DIAGNOSIS — Z79899 Other long term (current) drug therapy: Secondary | ICD-10-CM | POA: Insufficient documentation

## 2013-10-29 MED ORDER — OXYCODONE-ACETAMINOPHEN 5-325 MG PO TABS: 1.0000 | ORAL_TABLET | Freq: Four times a day (QID) | ORAL | Status: DC | PRN

## 2013-10-29 MED ORDER — DEXAMETHASONE SODIUM PHOSPHATE 10 MG/ML IJ SOLN
10.0000 mg | Freq: Once | INTRAMUSCULAR | Status: AC
  Administered 2013-10-29: 10 mg via INTRAMUSCULAR
  Filled 2013-10-29: qty 1

## 2013-10-29 MED ORDER — HYDROMORPHONE HCL 1 MG/ML IJ SOLN
0.5000 mg | Freq: Once | INTRAMUSCULAR | Status: AC
  Administered 2013-10-29: 0.5 mg via INTRAMUSCULAR
  Filled 2013-10-29: qty 1

## 2013-10-29 NOTE — ED Notes (Signed)
Patient fell from tree 3 days ago, landing on neck and back, pain is 10/10 on the right neck radiating to right shoulder and back.  Pain is constant.  Has pain medications at home but they are not helping.  Was seen here 3 days ago when he originally fell.

## 2013-10-29 NOTE — ED Provider Notes (Signed)
Medical screening examination/treatment/procedure(s) were conducted as a shared visit with non-physician practitioner(s) and myself.  I personally evaluated the patient during the encounter.   EKG Interpretation   Date/Time:  Friday October 27 2013 12:29:19 EDT Ventricular Rate:  93 PR Interval:  171 QRS Duration: 92 QT Interval:  392 QTC Calculation: 488 R Axis:   62 Text Interpretation:  Sinus rhythm Borderline prolonged QT interval Sinus  rhythm qtp, borderline Borderline ECG Confirmed by Carmin Muskrat  MD  ([id number]) on 10/27/2013 3:49:32 PM     On my exam the patient was awake, alert, MAES and was ambulatory. His scans were reassuring though he continued to have diffuse soreness.   Carmin Muskrat, MD 10/29/13 [id number]

## 2013-10-29 NOTE — ED Provider Notes (Signed)
CSN: [id number]     Arrival date & time 10/29/13  1107 History   First MD Initiated Contact with Patient 10/29/13 1133     Chief Complaint  Patient presents with  . Neck Pain  . Back Pain     (Consider location/radiation/quality/duration/timing/severity/associated sxs/prior Treatment) HPI Comments: Patient is a 53 year old male past history of hypertension, chronic back pain presents emergency room chief complaint of headache and neck pain since sustaining a fall 2 days ago. The patient reports compliance with discharge medication of Percocet but has not taken anything else for pain. He reports posterior headache. Denies recent injury or fall. Reports ice and heat therapy intermittently. Denies taking over-the-counter medications. He reports he is out of Percocet from 2 days ago and is requesting more. Unable to take ibuprofen do to "stomach burning". Patient reports taking Percocet and a valume for chronic back and neck pain. Currently trying to be seen by chronic pain clinic and [facility]. No PCP  Patient is a 53 y.o. male presenting with neck pain and back pain. The history is provided by the patient. No language interpreter was used.  Neck Pain Associated symptoms: headaches and photophobia   Associated symptoms: no chest pain, no fever, no numbness and no weakness   Back Pain Associated symptoms: headaches   Associated symptoms: no chest pain, no fever, no numbness and no weakness     Past Medical History  Diagnosis Date  . Hypertension    Past Surgical History  Procedure Laterality Date  . Lymph node biopsy     History reviewed. No pertinent family history. History  Substance Use Topics  . Smoking status: Current Every Day Smoker -- 0.50 packs/day    Types: Cigarettes  . Smokeless tobacco: Not on file  . Alcohol Use: Yes     Comment: occassional last night    Review of Systems  Constitutional: Negative for fever and chills.  Eyes: Positive for photophobia.    Respiratory: Negative for shortness of breath.   Cardiovascular: Negative for chest pain.  Musculoskeletal: Positive for back pain and neck pain.  Neurological: Positive for headaches. Negative for weakness and numbness.      Allergies  Ibuprofen and Tegretol  Home Medications   Prior to Admission medications   Medication Sig Start Date End Date Taking? Authorizing Provider  amitriptyline (ELAVIL) 75 MG tablet Take 75 mg by mouth at bedtime.    Historical Provider, MD  amLODipine (NORVASC) 10 MG tablet Take 10 mg by mouth daily.    Historical Provider, MD  hydrochlorothiazide (HYDRODIURIL) 50 MG tablet Take 50 mg by mouth daily.    Historical Provider, MD  oxyCODONE-acetaminophen (PERCOCET/ROXICET) 5-325 MG per tablet Take 1 tablet by mouth every 8 (eight) hours as needed for moderate pain or severe pain. 10/27/13   Marissa Sciacca, PA-C  potassium chloride SA (K-DUR,KLOR-CON) 20 MEQ tablet Take 1 tablet (20 mEq total) by mouth daily. 10/27/13   Marissa Sciacca, PA-C   BP 128/86  Pulse 96  Temp(Src) 98.3 F (36.8 C) (Oral)  Resp 18  Ht 5\' 6"  (1.676 m)  Wt 150 lb (68.04 kg)  BMI 24.22 kg/m2  SpO2 99% Physical Exam  Nursing note and vitals reviewed. Constitutional: He appears well-developed and well-nourished. No distress.  HENT:  Head: Normocephalic.  Eyes: EOM are normal.  Neck: Neck supple. Spinous process tenderness present. No rigidity.    Normal sensation and grip strength to bilateral upper extremities. Full range of motion to upper extremities.  Cardiovascular: Normal  rate and regular rhythm.   Pulmonary/Chest: Effort normal and breath sounds normal. He has no wheezes.  Abdominal: Soft. There is no tenderness. There is no rebound.  Neurological: He is alert.  Skin: Skin is warm and dry. He is not diaphoretic.  Psychiatric: He has a normal mood and affect. His behavior is normal.    ED Course  Procedures (including critical care time) Labs Review Labs Reviewed  - No data to display  Imaging Review    EKG Interpretation None      MDM   Final diagnoses:  Post concussion syndrome  Neck pain   Patient presents with postconcussive headache and neck pain negative CT of the head, C-spine, negative x-ray of shoulder, elbow and hand. No neurologic deficits on exam. Patient requesting refill of Percocet medication no PCP. Will give pain medication and resources. Discussed lab results, imaging results, and treatment plan with the patient. Return precautions given. Reports understanding and no other concerns at this time.  Patient is stable for discharge at this time.  Meds given in ED:  Medications  dexamethasone (DECADRON) injection 10 mg (10 mg Intramuscular Given 10/29/13 1215)  HYDROmorphone (DILAUDID) injection 0.5 mg (0.5 mg Intramuscular Given 10/29/13 1215)    Discharge Medication List as of 10/29/2013  1:02 PM    START taking these medications   Details  !! oxyCODONE-acetaminophen (PERCOCET/ROXICET) 5-325 MG per tablet Take 1 tablet by mouth every 6 (six) hours as needed for moderate pain or severe pain., Starting 10/29/2013, Until Discontinued, Print     !! - Potential duplicate medications found. Please discuss with provider.          Harvie Heck, PA-C 10/29/13 [phone]

## 2013-10-29 NOTE — Discharge Instructions (Signed)
Call for a follow up appointment with a Family or Primary Care Provider.  Return if Symptoms worsen.   Take medication as prescribed.  Ice your neck and any thing else that hurts 3-4 times a day. Walk around and do gentle stretching to promote blood supply to muscles.  Emergency Department Resource Guide 1) Find a Doctor and Pay Out of Pocket Although you won't have to find out who is covered by your insurance plan, it is a good idea to ask around and get recommendations. You will then need to call the office and see if the doctor you have chosen will accept you as a new patient and what types of options they offer for patients who are self-pay. Some doctors offer discounts or will set up payment plans for their patients who do not have insurance, but you will need to ask so you aren't surprised when you get to your appointment.  2) Contact Your Local Health Department Not all health departments have doctors that can see patients for sick visits, but many do, so it is worth a call to see if yours does. If you don't know where your local health department is, you can check in your phone book. The CDC also has a tool to help you locate your state's health department, and many state websites also have listings of all of their local health departments.  3) Find a [facility] If your illness is not likely to be very severe or complicated, you may want to try a walk in clinic. These are popping up all over the country in pharmacies, drugstores, and shopping centers. They're usually staffed by nurse practitioners or physician assistants that have been trained to treat common illnesses and complaints. They're usually fairly quick and inexpensive. However, if you have serious medical issues or chronic medical problems, these are probably not your best option.  No Primary Care Doctor: - Call Health Connect at  [phone] - they can help you locate a primary care doctor that  accepts your insurance, provides  certain services, etc. - Physician Referral Service- [phone]  Chronic Pain Problems: Organization         Address  Phone   Notes  [facility]  [phone] Patients need to be referred by their primary care doctor.   Medication Assistance: Organization         Address  Phone   Notes  [facility] Medication [facility] [address] [place], [place] [address] [phone] --Must be a resident of [facility] -- Must have NO insurance coverage whatsoever (no Medicaid/ Medicare, etc.) -- The pt. MUST have a primary care doctor that directs their care regularly and follows them in the community   MedAssist  [phone]   [employer]  [phone]    Agencies that provide inexpensive medical care: Organization         Address  Phone   Notes  [place]  [phone]   [facility]    [phone]   [facility] [place], [place] [address] [phone]   [place] [address] [place] [phone]   Planned Parenthood    [phone]   [facility]    [phone]   [place] and [place] [address], [place] Phone:  [phone], Fax:  [phone] Hours of Operation:  9 am -  6 pm, M-F.  Also accepts Medicaid/Medicare and self-pay.  [facility] [place], [address], [place] Phone: [phone], Fax: [phone]. Hours of Operation:  8:30 am - 5:30 pm, M-F.  Also accepts Medicaid and self-pay.  [facility] [place] [address], [place] Phone: [phone]   [place], [place], [place] [phone], Ext. [phone] Mondays & Thursdays: 7-9 AM.  First 15 patients are seen on a first come, first serve basis.    [place] Providers:  Organization         Address  Phone   Notes  [facility] [address], [place] [phone] Also accepts self-pay patients.  [facility] [address], [place]  [phone]   [place], [address], [place] [phone]   [facility] [address] [place] [phone]   Lucianne Lei [address], [place]   [phone] Only accepts [place] Access [place] patients after they have their name applied to their card.   Self-Pay (no insurance) in [facility]:  Organization         Address  Phone   Notes  Sickle Cell Patients, [facility] [place] [phone]   [facility]gent [facility] [phone]   [facility] [place]  [place], [place], [place] [phone]   [facility]/Dr. Osei-Bonsu  [address] [place] or [address], [place] [phone] Phone number for both [place] and [place] locations is the same.  [facility] [address], [place] [phone]   [facility] [address], [place] or [address] [phone]   [facility] [address] [place] [phone], phone; [phone], fax Sees patients 1st and 3rd Saturday of every month.  Must not qualify for public or private insurance (i.e. Medicaid, Medicare, [place] Health Choice, Veterans' Benefits)  Household income should be no more than 200% of the poverty level The clinic cannot treat you if you are pregnant or think you are pregnant  Sexually transmitted diseases are not treated at the clinic.    Dental Care: Organization         Address  Phone  Notes  [facility] Department of [facility] [phone]  Accepts children up to age 76 who are enrolled in [place] or [place]; pregnant women with a Medicaid card; and children who have applied for Medicaid or [place] Health Choice, but were declined, whose parents can pay a reduced fee at time of service.  [facility] Department of [facility]  [address], [place] [phone] Accepts children up to age 37 who are enrolled in [place] or [place]; pregnant women with a Medicaid card; and children who have applied for Medicaid or [place] Health Choice, but were declined, whose parents can pay a reduced fee at time of service.  [place] Adult [facility] Access PROGRAM  [place] [phone] Patients are seen by appointment only. Walk-ins are not accepted. [place] will see patients 59 years of age and  older. Monday - Tuesday (8am-5pm) Most Wednesdays (8:30-5pm) $30 per visit, cash only  [facility] Adult [employer] PROGRAM  [address], Surgery Center [phone] Patients are seen by appointment only. Walk-ins are not accepted. [place] will see patients 42 years of age and older. One Wednesday Evening (Monthly: Volunteer Based).  $30 per visit, cash only  [place]  [phone] for adults; Children under age 32, call Graduate Pediatric Dentistry at [phone]. Children aged 47-14, please call [phone] to request a pediatric application.  Dental services are provided in all areas of dental care including fillings, crowns and bridges, complete and partial dentures, implants, gum treatment, root canals, and extractions. Preventive care is also provided. Treatment is provided to both adults and children. Patients are selected via a lottery and there is often a waiting list.   [facility] [address] [place]  [phone] [email or web address]   [facility] [address] [place], [place] [phone], Ext. [phone] Second  and Fourth Thursday of each month, opens at 6:30 AM; Clinic ends at 9 AM.  Patients are seen on a first-come first-served basis, and a limited number are seen during each clinic.   [facility]  [address] Hillard Danker [place], [place] [phone]   Eligibility Requirements You must have lived in [place], [place], or [place] counties for at least the last three months.   You cannot be eligible for state or federal sponsored [employer], including [employer], [place], or [employer].   You generally cannot be eligible for healthcare insurance through your employer.    How to apply: Eligibility screenings are held every Tuesday and Wednesday afternoon from 1:00 pm until 4:00 pm. You do not need an appointment for the interview!  [facility] [address], [place], [place]   [place]  [place] Department  [place]  [phone]    Behavioral Health Resources in the Community: Intensive Outpatient Programs Organization         Address  Phone  Notes  [place] [facility] [place]. [address] [place], [place] [phone]   [facility] Outpatient [address], [place], [place]   ADS: Alcohol & Drug Svcs [address] [place], [place]   [place] [address]  [place], [place] or [phone]   Substance Abuse Resources Organization         Address  Phone  Notes  Alcohol and Drug Services  [phone]   [place]  [phone]   [facility] [place]   Chinita Pester  [phone]   Residential & Outpatient Substance Abuse Program  [phone]   Psychological Services Organization         Address  Phone  Notes  [facility] [place]  [place]  [phone]   [place] [address] [place] [phone] or [phone]    Mobile Crisis Teams Organization         Address  Phone  Notes  [facility]rapeutic Alternatives, Mobile Crisis Care Unit  [phone]   Assertive Psychotherapeutic Services  [address] [place], [place]   [facility] [address], [place] [phone]    Self-Help/Support Groups Organization         Address  Phone             Notes  Mental Health Assoc. of [place] - variety of support groups  [place] Call for more information  Narcotics Anonymous (NA), [facility]aring Services [address], [employer] [place]  2 meetings at this location   Special educational needs teacher         Address  Phone  Notes  [facility]idential [facility] [place],    [place]  [phone]   [facility]  [address], [place] [phone], [place], [place]   [place], [place] [phone] Admissions: 8am-3pm M-F  Incentives Substance [place] [address],    [place], [place] [phone]   [facility] [address] [place], [place], [place]   [facility] [address],  [place], [place]   Insight Programs - Intensive Outpatient [address] Kristeen [address], [place], [place]   [facility] ([place].,  [place], [place] [phone] or [phone]   [facility]idential Treatment Services (RTS) [address] [place], [place] Accepts Medicaid  [facility] [place] [address],  [place] [phone] Substance Abuse/Addiction Treatment   [facility] Organization         Address  Phone  Notes  [facility]  [phone]   Domenic Schwab, PhD [address] Arlis Porta [place], [place]   [phone] or [phone]   [facility], [place]  [phone]   [facility] [address] [place], [place] [phone] Insurance/Medicaid/sponsorship through [facility] and Families [address], [address] [place]                                    [place], [place] [phone] [place] [address], [place] [phone]    Dr. Adele Schilder  [phone]   [facility] [place] Dept. 1) [address], [place] 2) [place] 3)  [place] [address], [place] [phone]  [phone]   [place] [phone] or [phone] (After Hours)

## 2013-10-29 NOTE — ED Provider Notes (Signed)
Please see my other note.  Carmin Muskrat, MD 10/29/13 [phone]

## 2013-10-29 NOTE — ED Notes (Signed)
Pt c/o neck and generalized back pain; pt sts seen here on Friday after falling out of tree; pt sts pain meds not helping

## 2013-10-29 NOTE — ED Notes (Signed)
Patient actually declined wheelchair

## 2013-10-29 NOTE — ED Notes (Signed)
Vital signs stable. 

## 2013-11-01 ENCOUNTER — Emergency Department: Payer: Self-pay | Admitting: Student

## 2013-11-08 NOTE — ED Provider Notes (Signed)
Medical screening examination/treatment/procedure(s) were performed by non-physician practitioner and as supervising physician I was immediately available for consultation/collaboration.   EKG Interpretation None       Babette Relic, MD 11/08/13 1331

## 2013-12-03 ENCOUNTER — Emergency Department: Payer: Self-pay | Admitting: Emergency Medicine

## 2014-02-18 ENCOUNTER — Emergency Department: Payer: Self-pay | Admitting: Emergency Medicine

## 2014-02-18 ENCOUNTER — Inpatient Hospital Stay (HOSPITAL_COMMUNITY)
Admission: AD | Admit: 2014-02-18 | Discharge: 2014-02-26 | DRG: 917 | Disposition: A | Discharge status: Home / Self Care | Payer: Self-pay | Source: Other Acute Inpatient Hospital | Attending: Internal Medicine | Admitting: Internal Medicine

## 2014-02-18 ENCOUNTER — Inpatient Hospital Stay (HOSPITAL_COMMUNITY): Payer: Self-pay

## 2014-02-18 ENCOUNTER — Encounter (HOSPITAL_COMMUNITY): Payer: Self-pay | Admitting: Internal Medicine

## 2014-02-18 DIAGNOSIS — E870 Hyperosmolality and hypernatremia: Secondary | ICD-10-CM | POA: Diagnosis present

## 2014-02-18 DIAGNOSIS — Z609 Problem related to social environment, unspecified: Secondary | ICD-10-CM | POA: Diagnosis present

## 2014-02-18 DIAGNOSIS — T1491XA Suicide attempt, initial encounter: Secondary | ICD-10-CM | POA: Diagnosis present

## 2014-02-18 DIAGNOSIS — Z9119 Patient's noncompliance with other medical treatment and regimen: Secondary | ICD-10-CM | POA: Diagnosis present

## 2014-02-18 DIAGNOSIS — R000 Tachycardia, unspecified: Secondary | ICD-10-CM | POA: Diagnosis present

## 2014-02-18 DIAGNOSIS — I10 Essential (primary) hypertension: Secondary | ICD-10-CM | POA: Diagnosis present

## 2014-02-18 DIAGNOSIS — D72829 Elevated white blood cell count, unspecified: Secondary | ICD-10-CM | POA: Diagnosis present

## 2014-02-18 DIAGNOSIS — G8929 Other chronic pain: Secondary | ICD-10-CM | POA: Diagnosis present

## 2014-02-18 DIAGNOSIS — R4189 Other symptoms and signs involving cognitive functions and awareness: Secondary | ICD-10-CM

## 2014-02-18 DIAGNOSIS — I440 Atrioventricular block, first degree: Secondary | ICD-10-CM | POA: Diagnosis present

## 2014-02-18 DIAGNOSIS — F332 Major depressive disorder, recurrent severe without psychotic features: Secondary | ICD-10-CM | POA: Diagnosis present

## 2014-02-18 DIAGNOSIS — T50902A Poisoning by unspecified drugs, medicaments and biological substances, intentional self-harm, initial encounter: Principal | ICD-10-CM | POA: Diagnosis present

## 2014-02-18 DIAGNOSIS — E860 Dehydration: Secondary | ICD-10-CM | POA: Diagnosis present

## 2014-02-18 DIAGNOSIS — R0602 Shortness of breath: Secondary | ICD-10-CM

## 2014-02-18 DIAGNOSIS — E876 Hypokalemia: Secondary | ICD-10-CM | POA: Diagnosis present

## 2014-02-18 DIAGNOSIS — G92 Toxic encephalopathy: Secondary | ICD-10-CM | POA: Diagnosis present

## 2014-02-18 DIAGNOSIS — F17210 Nicotine dependence, cigarettes, uncomplicated: Secondary | ICD-10-CM | POA: Diagnosis present

## 2014-02-18 DIAGNOSIS — G9340 Encephalopathy, unspecified: Secondary | ICD-10-CM | POA: Diagnosis present

## 2014-02-18 DIAGNOSIS — T50901A Poisoning by unspecified drugs, medicaments and biological substances, accidental (unintentional), initial encounter: Secondary | ICD-10-CM | POA: Diagnosis present

## 2014-02-18 DIAGNOSIS — N179 Acute kidney failure, unspecified: Secondary | ICD-10-CM | POA: Diagnosis present

## 2014-02-18 DIAGNOSIS — J9690 Respiratory failure, unspecified, unspecified whether with hypoxia or hypercapnia: Secondary | ICD-10-CM | POA: Diagnosis present

## 2014-02-18 DIAGNOSIS — J189 Pneumonia, unspecified organism: Secondary | ICD-10-CM

## 2014-02-18 DIAGNOSIS — I959 Hypotension, unspecified: Secondary | ICD-10-CM | POA: Diagnosis present

## 2014-02-18 DIAGNOSIS — J9600 Acute respiratory failure, unspecified whether with hypoxia or hypercapnia: Secondary | ICD-10-CM | POA: Diagnosis present

## 2014-02-18 DIAGNOSIS — M549 Dorsalgia, unspecified: Secondary | ICD-10-CM | POA: Diagnosis present

## 2014-02-18 HISTORY — DX: Depression, unspecified: F32.A

## 2014-02-18 HISTORY — DX: Suicide attempt, initial encounter: T14.91XA

## 2014-02-18 HISTORY — DX: Tobacco use: Z72.0

## 2014-02-18 HISTORY — DX: Other chronic pain: G89.29

## 2014-02-18 HISTORY — DX: Major depressive disorder, single episode, unspecified: F32.9

## 2014-02-18 HISTORY — DX: Dorsalgia, unspecified: M54.9

## 2014-02-18 LAB — COMPREHENSIVE METABOLIC PANEL
ALBUMIN: 3.4 g/dL (ref 3.4–5.0)
ALBUMIN: 3 g/dL — AB (ref 3.5–5.2)
ALK PHOS: 117 U/L — AB
ALT: 20 U/L (ref 0–53)
AST: 23 U/L (ref 15–37)
AST: 39 U/L — AB (ref 0–37)
Alkaline Phosphatase: 98 U/L (ref 39–117)
Anion Gap: 10 (ref 7–16)
Anion gap: 10 (ref 5–15)
BUN: 15 mg/dL (ref 7–18)
BUN: 15 mg/dL (ref 6–23)
Bilirubin,Total: 0.3 mg/dL (ref 0.2–1.0)
CALCIUM: 8.9 mg/dL (ref 8.5–10.1)
CHLORIDE: 106 mmol/L (ref 98–107)
CO2: 28 mmol/L (ref 21–32)
CO2: 20 mmol/L (ref 19–32)
CREATININE: 1.76 mg/dL — AB (ref 0.50–1.35)
Calcium: 7.9 mg/dL — ABNORMAL LOW (ref 8.4–10.5)
Chloride: 112 mEq/L (ref 96–112)
Creatinine: 2.18 mg/dL — ABNORMAL HIGH (ref 0.60–1.30)
EGFR (African American): 41 — ABNORMAL LOW
EGFR (Non-African Amer.): 34 — ABNORMAL LOW
GFR calc Af Amer: 50 mL/min — ABNORMAL LOW (ref >90)
GFR calc non Af Amer: 43 mL/min — ABNORMAL LOW (ref >90)
Glucose, Bld: 186 mg/dL — ABNORMAL HIGH (ref 70–99)
Glucose: 137 mg/dL — ABNORMAL HIGH (ref 65–99)
Osmolality: 290 (ref 275–301)
POTASSIUM: 3 mmol/L — AB (ref 3.5–5.1)
Potassium: 3.2 mmol/L — ABNORMAL LOW (ref 3.5–5.1)
SGPT (ALT): 23 U/L
SODIUM: 144 mmol/L (ref 136–145)
Sodium: 142 mmol/L (ref 135–145)
Total Bilirubin: 0.3 mg/dL (ref 0.3–1.2)
Total Protein: 6.5 g/dL (ref 6.4–8.2)
Total Protein: 5.4 g/dL — ABNORMAL LOW (ref 6.0–8.3)

## 2014-02-18 LAB — URINALYSIS, COMPLETE
BACTERIA: NONE SEEN
BILIRUBIN, UR: NEGATIVE
Blood: NEGATIVE
GLUCOSE, UR: NEGATIVE mg/dL (ref 0–75)
Ketone: NEGATIVE
Leukocyte Esterase: NEGATIVE
Nitrite: NEGATIVE
PH: 7 (ref 4.5–8.0)
Protein: NEGATIVE
RBC,UR: <1 /HPF (ref 0–5)
SQUAMOUS EPITHELIAL: NONE SEEN
Specific Gravity: 1.004 (ref 1.003–1.030)

## 2014-02-18 LAB — BASIC METABOLIC PANEL
Anion gap: 12 (ref 5–15)
BUN: 14 mg/dL (ref 6–23)
CHLORIDE: 107 meq/L (ref 96–112)
CO2: 23 mmol/L (ref 19–32)
CREATININE: 1.39 mg/dL — AB (ref 0.50–1.35)
Calcium: 8.3 mg/dL — ABNORMAL LOW (ref 8.4–10.5)
GFR calc non Af Amer: 57 mL/min — ABNORMAL LOW (ref >90)
GFR, EST AFRICAN AMERICAN: 66 mL/min — AB (ref >90)
GLUCOSE: 171 mg/dL — AB (ref 70–99)
POTASSIUM: 2.9 mmol/L — AB (ref 3.5–5.1)
SODIUM: 142 mmol/L (ref 135–145)

## 2014-02-18 LAB — DRUG SCREEN, URINE
AMPHETAMINES, UR SCREEN: NEGATIVE (ref ?–1000)
Barbiturates, Ur Screen: NEGATIVE (ref ?–200)
Benzodiazepine, Ur Scrn: POSITIVE (ref ?–200)
Cannabinoid 50 Ng, Ur ~~LOC~~: NEGATIVE (ref ?–50)
Cocaine Metabolite,Ur ~~LOC~~: NEGATIVE (ref ?–300)
MDMA (ECSTASY) UR SCREEN: NEGATIVE (ref ?–500)
Methadone, Ur Screen: NEGATIVE (ref ?–300)
Opiate, Ur Screen: NEGATIVE (ref ?–300)
PHENCYCLIDINE (PCP) UR S: NEGATIVE (ref ?–25)
TRICYCLIC, UR SCREEN: POSITIVE (ref ?–1000)

## 2014-02-18 LAB — BLOOD GAS, ARTERIAL
ACID-BASE DEFICIT: 5.7 mmol/L — AB (ref 0.0–2.0)
Bicarbonate: 19.1 mEq/L — ABNORMAL LOW (ref 20.0–24.0)
Drawn by: 22561
FIO2: 1 %
O2 Saturation: 99.1 %
PATIENT TEMPERATURE: 98.6
PEEP: 5 cmH2O
RATE: 22 resp/min
TCO2: 20.2 mmol/L (ref 0–100)
VT: 450 mL
pCO2 arterial: 36.5 mmHg (ref 35.0–45.0)
pH, Arterial: 7.338 — ABNORMAL LOW (ref 7.350–7.450)
pO2, Arterial: 274 mmHg — ABNORMAL HIGH (ref 80.0–100.0)

## 2014-02-18 LAB — CBC WITH DIFFERENTIAL/PLATELET
Basophil #: 0 10*3/uL (ref 0.0–0.1)
Basophil %: 0.2 %
Basophils Absolute: 0 10*3/uL (ref 0.0–0.1)
Basophils Relative: 0 % (ref 0–1)
EOS PCT: 0.2 %
EOS PCT: 0 % (ref 0–5)
Eosinophil #: 0 10*3/uL (ref 0.0–0.7)
Eosinophils Absolute: 0 10*3/uL (ref 0.0–0.7)
HCT: 41.9 % (ref 40.0–52.0)
HCT: 36.3 % — ABNORMAL LOW (ref 39.0–52.0)
HEMOGLOBIN: 12.6 g/dL — AB (ref 13.0–17.0)
HGB: 14.1 g/dL (ref 13.0–18.0)
LYMPHS ABS: 0.8 10*3/uL (ref 0.7–4.0)
Lymphocyte #: 0.7 10*3/uL — ABNORMAL LOW (ref 1.0–3.6)
Lymphocyte %: 4.8 %
Lymphocytes Relative: 6 % — ABNORMAL LOW (ref 12–46)
MCH: 28.8 pg (ref 26.0–34.0)
MCH: 28.3 pg (ref 26.0–34.0)
MCHC: 33.6 g/dL (ref 32.0–36.0)
MCHC: 34.7 g/dL (ref 30.0–36.0)
MCV: 86 fL (ref 80–100)
MCV: 81.4 fL (ref 78.0–100.0)
MONOS PCT: 4.5 %
MONOS PCT: 5 % (ref 3–12)
Monocyte #: 0.7 x10 3/mm (ref 0.2–1.0)
Monocytes Absolute: 0.7 10*3/uL (ref 0.1–1.0)
NEUTROS ABS: 11 10*3/uL — AB (ref 1.7–7.7)
NEUTROS PCT: 89 % — AB (ref 43–77)
Neutrophil #: 13.4 10*3/uL — ABNORMAL HIGH (ref 1.4–6.5)
Neutrophil %: 90.3 %
PLATELETS: 272 10*3/uL (ref 150–440)
Platelets: 234 10*3/uL (ref 150–400)
RBC: 4.89 10*6/uL (ref 4.40–5.90)
RBC: 4.46 MIL/uL (ref 4.22–5.81)
RDW: 13.8 % (ref 11.5–14.5)
RDW: 13.8 % (ref 11.5–15.5)
WBC: 14.8 10*3/uL — ABNORMAL HIGH (ref 3.8–10.6)
WBC: 12.5 10*3/uL — ABNORMAL HIGH (ref 4.0–10.5)

## 2014-02-18 LAB — URINALYSIS, ROUTINE W REFLEX MICROSCOPIC
Bilirubin Urine: NEGATIVE
GLUCOSE, UA: NEGATIVE mg/dL
KETONES UR: NEGATIVE mg/dL
Leukocytes, UA: NEGATIVE
Nitrite: NEGATIVE
PROTEIN: 30 mg/dL — AB
Specific Gravity, Urine: 1.019 (ref 1.005–1.030)
Urobilinogen, UA: 0.2 mg/dL (ref 0.0–1.0)
pH: 5.5 (ref 5.0–8.0)

## 2014-02-18 LAB — GLUCOSE, CAPILLARY
GLUCOSE-CAPILLARY: 173 mg/dL — AB (ref 70–99)
Glucose-Capillary: 150 mg/dL — ABNORMAL HIGH (ref 70–99)
Glucose-Capillary: 171 mg/dL — ABNORMAL HIGH (ref 70–99)

## 2014-02-18 LAB — ACETAMINOPHEN LEVEL: Acetaminophen: <2 ug/mL

## 2014-02-18 LAB — TRICYCLICS SCREEN, URINE: TCA SCRN: POSITIVE — AB

## 2014-02-18 LAB — CALCIUM, IONIZED: Calcium, Ion: 1.15 mmol/L (ref 1.12–1.32)

## 2014-02-18 LAB — LACTIC ACID, PLASMA: LACTIC ACID, VENOUS: 4.3 mmol/L — AB (ref 0.5–2.2)

## 2014-02-18 LAB — MAGNESIUM
Magnesium: 1.7 mg/dL — ABNORMAL LOW
Magnesium: 1.5 mg/dL (ref 1.5–2.5)

## 2014-02-18 LAB — CREATININE, URINE, RANDOM: Creatinine, Urine: 171.89 mg/dL

## 2014-02-18 LAB — RAPID URINE DRUG SCREEN, HOSP PERFORMED
AMPHETAMINES: NOT DETECTED
Barbiturates: NOT DETECTED
Benzodiazepines: POSITIVE — AB
Cocaine: NOT DETECTED
OPIATES: NOT DETECTED
Tetrahydrocannabinol: NOT DETECTED

## 2014-02-18 LAB — ETHANOL
Alcohol, Ethyl (B): <5 mg/dL (ref 0–9)
Ethanol: <3 mg/dL

## 2014-02-18 LAB — SALICYLATE LEVEL
Salicylate Lvl: <4 mg/dL (ref 2.8–20.0)
Salicylates, Serum: 2.6 mg/dL

## 2014-02-18 LAB — PHOSPHORUS: Phosphorus: 3.4 mg/dL (ref 2.3–4.6)

## 2014-02-18 LAB — URINE MICROSCOPIC-ADD ON

## 2014-02-18 LAB — MRSA PCR SCREENING: MRSA by PCR: NEGATIVE

## 2014-02-18 LAB — TROPONIN I

## 2014-02-18 LAB — PROCALCITONIN: Procalcitonin: <0.1 ng/mL

## 2014-02-18 MED ORDER — POTASSIUM CHLORIDE 20 MEQ/15ML (10%) PO SOLN
40.0000 meq | ORAL | Status: AC
Start: 2014-02-18 — End: 2014-02-19
  Administered 2014-02-18 – 2014-02-19 (×3): 40 meq
  Filled 2014-02-18 (×3): qty 30

## 2014-02-18 MED ORDER — FAMOTIDINE 40 MG/5ML PO SUSR
20.0000 mg | Freq: Two times a day (BID) | ORAL | Status: DC
  Administered 2014-02-18 – 2014-02-19 (×4): 20 mg
  Filled 2014-02-18 (×6): qty 2.5

## 2014-02-18 MED ORDER — ACETAMINOPHEN 325 MG PO TABS: 650.0000 mg | ORAL_TABLET | ORAL | Status: DC | PRN

## 2014-02-18 MED ORDER — ALBUTEROL SULFATE (2.5 MG/3ML) 0.083% IN NEBU: 2.5000 mg | INHALATION_SOLUTION | RESPIRATORY_TRACT | Status: DC | PRN

## 2014-02-18 MED ORDER — SODIUM BICARBONATE 8.4 % IV SOLN
INTRAVENOUS | Status: DC
  Administered 2014-02-18 – 2014-02-19 (×3): via INTRAVENOUS
  Filled 2014-02-18 (×5): qty 150

## 2014-02-18 MED ORDER — SODIUM CHLORIDE 0.9 % IV SOLN
INTRAVENOUS | Status: DC
  Administered 2014-02-18: 12:00:00 via INTRAVENOUS

## 2014-02-18 MED ORDER — SODIUM CHLORIDE 0.9 % IV SOLN: 250.0000 mL | INTRAVENOUS | Status: DC | PRN

## 2014-02-18 MED ORDER — HEPARIN SODIUM (PORCINE) 5000 UNIT/ML IJ SOLN
5000.0000 [IU] | Freq: Three times a day (TID) | INTRAMUSCULAR | Status: DC
  Administered 2014-02-18 – 2014-02-21 (×9): 5000 [IU] via SUBCUTANEOUS
  Filled 2014-02-18 (×12): qty 1

## 2014-02-18 MED ORDER — ONDANSETRON HCL 4 MG/2ML IJ SOLN: 4.0000 mg | Freq: Four times a day (QID) | INTRAMUSCULAR | Status: DC | PRN

## 2014-02-18 NOTE — H&P (Signed)
PULMONARY / CRITICAL CARE MEDICINE   Name: Alvin Gonzalez MRN: [id number] DOB: November 28, 1960    ADMISSION DATE:  02/18/2014 CONSULTATION DATE:  02/18/14   REFERRING MD :  Selena Lesser  CHIEF COMPLAINT:  AMS, overdose   INITIAL PRESENTATION:  54 y.o PMH HTN, depression, tobacco abuse. Admitted to [facility] with overdose (? Substance ? Heroine, Xanax (GF had Xanax Rx 02/11/14 with 75 pills and bottle was empty) and/or Valium, Elavil, Seroquel, HCTZ, Norvasc, Prozac, cocaine, opiates, tramadol) ?intentional ?suicide attempt with previous h/o suicide attempt. GCS 3 at [place]. Given bicarb and Narcan 2 mg, NS 1L for sbp 90s. Intubated at outside facility   STUDIES:  CXR 1/10>>  SIGNIFICANT EVENTS: 1/10 admit, ETT   HISTORY OF PRESENT ILLNESS:   54 y.o PMH HTN, depression, tobacco abuse, h/o substance abuse, h/o prisoner. Admitted to [facility] with overdose (? Substance ? Heroine, Xanax, Elavil, Seroquel, HCTZ, Norvasc, Prozac, cocaine, opiates) ?intentional ?suicide attempt with previous h/o suicide attempt. GCS 3 at [place]. Given bicarb and Narcan 2 mg, NS 1L for sbp 90s. Intubated at outside facility transferred to [facility] for further care  PAST MEDICAL HISTORY :   has a past medical history of Hypertension; Tobacco abuse; Depression; Suicide attempt; and Chronic back pain.  has past surgical history that includes Lymph node biopsy. Prior to Admission medications   Medication Sig Start Date End Date Taking? Authorizing Provider  amitriptyline (ELAVIL) 75 MG tablet Take 75 mg by mouth at bedtime as needed for sleep.     Historical Provider, MD  amLODipine (NORVASC) 10 MG tablet Take 10 mg by mouth daily.    Historical Provider, MD  diclofenac sodium (VOLTAREN) 1 % GEL Apply 2 g topically 4 (four) times daily as needed (for pain).    Historical Provider, MD  hydrochlorothiazide (HYDRODIURIL) 50 MG tablet Take 50 mg by mouth daily.    Historical Provider, MD  oxyCODONE-acetaminophen (PERCOCET/ROXICET)  5-325 MG per tablet Take 1 tablet by mouth every 8 (eight) hours as needed for moderate pain or severe pain. 10/27/13   Marissa Sciacca, PA-C  oxyCODONE-acetaminophen (PERCOCET/ROXICET) 5-325 MG per tablet Take 1 tablet by mouth every 6 (six) hours as needed for moderate pain or severe pain. 10/29/13   Harvie Heck, PA-C   Allergies  Allergen Reactions  . Ibuprofen Nausea And Vomiting  . Tegretol [Carbamazepine] Rash    FAMILY HISTORY:  has no family status information on file.  SOCIAL HISTORY:  reports that he has been smoking Cigarettes.  He has been smoking about 0.50 packs per day. He does not have any smokeless tobacco history on file. He reports that he drinks alcohol. He reports that he does not use illicit drugs.  REVIEW OF SYSTEMS:   Unable to obtain due to mental status   SUBJECTIVE:  Unable to obtain due to mental status   VITAL SIGNS: Temp:  [95.6 F (35.3 C)] 95.6 F (35.3 C) (01/10 1200) FiO2 (%):  [100 %] 100 % (01/10 1115) Weight:  [160 lb 7.9 oz (72.8 kg)] 160 lb 7.9 oz (72.8 kg) (01/10 1200) HEMODYNAMICS:   VENTILATOR SETTINGS: Vent Mode:  [-] PRVC FiO2 (%):  [100 %] 100 % Set Rate:  [22 bmp] 22 bmp Vt Set:  [450 mL] 450 mL PEEP:  [5 cmH20] 5 cmH20 Plateau Pressure:  [14 cmH20] 14 cmH20 INTAKE / OUTPUT: No intake or output data in the 24 hours ending 02/18/14 1216  PHYSICAL EXAMINATION: General:  Lying in bed  Neuro:  Encephalopathic, sedated w/o medications  HEENT:  [place]/at, perrl b/l 3 mm and sluggish Cardiovascular: RRR, no murmurs  Lungs:  ctab Abdomen:  Soft, ntnd, nl bs  Musculoskeletal:  Nl tone Skin:  Intact, tattoo to right forearm Ext: w/o edema  LABS:  CBC No results for input(s): WBC, HGB, HCT, PLT in the last 168 hours. Coag's No results for input(s): APTT, INR in the last 168 hours. BMET No results for input(s): NA, K, CL, CO2, BUN, CREATININE, GLUCOSE in the last 168 hours. Electrolytes No results for input(s): CALCIUM, MG, PHOS  in the last 168 hours. Sepsis Markers No results for input(s): LATICACIDVEN, PROCALCITON, O2SATVEN in the last 168 hours. ABG No results for input(s): PHART, PCO2ART, PO2ART in the last 168 hours. Liver Enzymes No results for input(s): AST, ALT, ALKPHOS, BILITOT, ALBUMIN in the last 168 hours. Cardiac Enzymes No results for input(s): TROPONINI, PROBNP in the last 168 hours. Glucose No results for input(s): GLUCAP in the last 168 hours.  Imaging No results found.   ASSESSMENT / PLAN:  PULMONARY OETT 1/10>> A: VDRF   P:   ABG, CXR stat and in am  Full vent support   CARDIOVASCULAR CVL none A:  History of tachycardic with QRS widening now NSR. QTc prior to admission was 475 Prolonged QT  Sl 1st degree AV block Soft BP   P:  Pending EKG (monitor QT and QRS) w concern for TCA overdose, trop x 1  ekg in am No pressors. sodium bicarb 150 at 100 cc/hr  RENAL A:   AKI unknown BL Cr. At [place] 2.18 Hypokalemia 3.0 at [place]  P:   Pending labs and lactic acid, UA, trend BMET, urine lytes  Replete electrolytes prn   GASTROINTESTINAL A:   GI px  NPO P:   pepcid 20 bid   HEMATOLOGIC A:   DVT px  No other issues  P:  Heparin sq,scds   INFECTIOUS A:   Leukocytosis labs [place] 14.8 could be reactive    P:   BCx2 not obtained  UC not obtained  Sputum not obtained  Abx: none   Trend CBC, pending PCT  ENDOCRINE A:   No acute issues  P:   Monitor cbgs q4 hours   NEUROLOGIC/PSYCHIATRY A:   Encephalopathy likely due to polysubstance overdose outside labs +TCA Suicide attempt Chronic back pain  P:   RASS goal: 0 to -1 currently -5  Hold sedating mediations  Pending UDS, acetaminophen, ethanol, salicylate, TCA lab and urine Seizure precautions  Sitter when pt awake, suicide precautions, will need psychiatry when awake    FAMILY  - Updates: girlfriend at bedside, sister, step father and father 68/10  - Inter-disciplinary family meet or  Palliative Care meeting due by:  02/25/14     TODAY'S SUMMARY:  VDRF with acute encephalopathy 2/2 polysubstance overdose with intentional suicide attempt suspected.  Pending labs to follow. Monitor EKG   Pulmonary and [place] Pager: [phone]  02/18/2014, 12:16 PM Karlyn Agee MD [phone]   PCCM ATTENDING: I have reviewed pt's initial presentation, consultants notes and hospital database in detail.  The above assessment and plan was formulated under my direction.   Merton Border, MD;  PCCM service; Mobile [phone]

## 2014-02-19 ENCOUNTER — Inpatient Hospital Stay (HOSPITAL_COMMUNITY): Payer: Self-pay

## 2014-02-19 DIAGNOSIS — T50902D Poisoning by unspecified drugs, medicaments and biological substances, intentional self-harm, subsequent encounter: Secondary | ICD-10-CM

## 2014-02-19 LAB — BASIC METABOLIC PANEL
Anion gap: 11 (ref 5–15)
Anion gap: 9 (ref 5–15)
BUN: 12 mg/dL (ref 6–23)
BUN: 11 mg/dL (ref 6–23)
CALCIUM: 8.3 mg/dL — AB (ref 8.4–10.5)
CHLORIDE: 103 meq/L (ref 96–112)
CO2: 26 mmol/L (ref 19–32)
CO2: 26 mmol/L (ref 19–32)
Calcium: 8.1 mg/dL — ABNORMAL LOW (ref 8.4–10.5)
Chloride: 106 mEq/L (ref 96–112)
Creatinine, Ser: 1.13 mg/dL (ref 0.50–1.35)
Creatinine, Ser: 1 mg/dL (ref 0.50–1.35)
GFR calc non Af Amer: 74 mL/min — ABNORMAL LOW (ref >90)
GFR, EST AFRICAN AMERICAN: 85 mL/min — AB (ref >90)
GFR, EST NON AFRICAN AMERICAN: 85 mL/min — AB (ref >90)
Glucose, Bld: 167 mg/dL — ABNORMAL HIGH (ref 70–99)
Glucose, Bld: 139 mg/dL — ABNORMAL HIGH (ref 70–99)
POTASSIUM: 3.1 mmol/L — AB (ref 3.5–5.1)
Potassium: 2.7 mmol/L — CL (ref 3.5–5.1)
Sodium: 140 mmol/L (ref 135–145)
Sodium: 141 mmol/L (ref 135–145)

## 2014-02-19 LAB — BLOOD GAS, ARTERIAL
Acid-Base Excess: 3.8 mmol/L — ABNORMAL HIGH (ref 0.0–2.0)
Bicarbonate: 26.8 mEq/L — ABNORMAL HIGH (ref 20.0–24.0)
Drawn by: 39898
FIO2: 0.4 %
O2 Saturation: 99.1 %
PEEP: 5 cmH2O
PH ART: 7.513 — AB (ref 7.350–7.450)
PO2 ART: 148 mmHg — AB (ref 80.0–100.0)
Patient temperature: 98.6
RATE: 16 resp/min
TCO2: 27.8 mmol/L (ref 0–100)
VT: 500 mL
pCO2 arterial: 33.6 mmHg — ABNORMAL LOW (ref 35.0–45.0)

## 2014-02-19 LAB — GLUCOSE, CAPILLARY
GLUCOSE-CAPILLARY: 135 mg/dL — AB (ref 70–99)
GLUCOSE-CAPILLARY: 140 mg/dL — AB (ref 70–99)
GLUCOSE-CAPILLARY: 143 mg/dL — AB (ref 70–99)
Glucose-Capillary: 140 mg/dL — ABNORMAL HIGH (ref 70–99)
Glucose-Capillary: 151 mg/dL — ABNORMAL HIGH (ref 70–99)
Glucose-Capillary: 160 mg/dL — ABNORMAL HIGH (ref 70–99)

## 2014-02-19 LAB — CBC
HEMATOCRIT: 35.4 % — AB (ref 39.0–52.0)
Hemoglobin: 12.4 g/dL — ABNORMAL LOW (ref 13.0–17.0)
MCH: 29 pg (ref 26.0–34.0)
MCHC: 35 g/dL (ref 30.0–36.0)
MCV: 82.7 fL (ref 78.0–100.0)
Platelets: 247 10*3/uL (ref 150–400)
RBC: 4.28 MIL/uL (ref 4.22–5.81)
RDW: 13.9 % (ref 11.5–15.5)
WBC: 12.4 10*3/uL — ABNORMAL HIGH (ref 4.0–10.5)

## 2014-02-19 LAB — MAGNESIUM: MAGNESIUM: 1.6 mg/dL (ref 1.5–2.5)

## 2014-02-19 LAB — UREA NITROGEN, URINE: Urea Nitrogen, Ur: 507 mg/dL

## 2014-02-19 MED ORDER — CHLORHEXIDINE GLUCONATE 0.12 % MT SOLN
OROMUCOSAL | Status: AC
  Filled 2014-02-19: qty 15

## 2014-02-19 MED ORDER — POTASSIUM CHLORIDE 20 MEQ/15ML (10%) PO SOLN: 40.0000 meq | Freq: Once | ORAL | Status: DC

## 2014-02-19 MED ORDER — POTASSIUM CHLORIDE 20 MEQ/15ML (10%) PO SOLN: 40.0000 meq | Freq: Two times a day (BID) | ORAL | Status: DC

## 2014-02-19 MED ORDER — DOCUSATE SODIUM 50 MG/5ML PO LIQD
100.0000 mg | Freq: Two times a day (BID) | ORAL | Status: DC | PRN
  Filled 2014-02-19: qty 10

## 2014-02-19 MED ORDER — VITAL HIGH PROTEIN PO LIQD
1000.0000 mL | ORAL | Status: DC
  Filled 2014-02-19 (×2): qty 1000

## 2014-02-19 MED ORDER — POTASSIUM CHLORIDE 20 MEQ/15ML (10%) PO SOLN
40.0000 meq | ORAL | Status: AC
  Administered 2014-02-19 (×2): 40 meq
  Filled 2014-02-19 (×2): qty 30

## 2014-02-19 MED ORDER — POTASSIUM CHLORIDE 20 MEQ/15ML (10%) PO SOLN
40.0000 meq | Freq: Once | ORAL | Status: AC
  Administered 2014-02-19: 40 meq
  Filled 2014-02-19: qty 30

## 2014-02-19 MED ORDER — MAGNESIUM SULFATE 2 GM/50ML IV SOLN
2.0000 g | Freq: Once | INTRAVENOUS | Status: AC
  Administered 2014-02-19: 2 g via INTRAVENOUS
  Filled 2014-02-19: qty 50

## 2014-02-19 MED ORDER — FENTANYL CITRATE 0.05 MG/ML IJ SOLN
100.0000 ug | INTRAMUSCULAR | Status: DC | PRN
  Administered 2014-02-19 – 2014-02-20 (×2): 100 ug via INTRAVENOUS
  Filled 2014-02-19 (×2): qty 2

## 2014-02-19 MED ORDER — CETYLPYRIDINIUM CHLORIDE 0.05 % MT LIQD
7.0000 mL | Freq: Four times a day (QID) | OROMUCOSAL | Status: DC
  Administered 2014-02-19 – 2014-02-20 (×5): 7 mL via OROMUCOSAL

## 2014-02-19 MED ORDER — POTASSIUM CHLORIDE 20 MEQ/15ML (10%) PO SOLN
40.0000 meq | Freq: Once | ORAL | Status: DC
  Filled 2014-02-19: qty 30

## 2014-02-19 MED ORDER — FENTANYL CITRATE 0.05 MG/ML IJ SOLN
100.0000 ug | INTRAMUSCULAR | Status: DC | PRN
  Filled 2014-02-19: qty 2

## 2014-02-19 MED ORDER — CHLORHEXIDINE GLUCONATE 0.12 % MT SOLN
15.0000 mL | Freq: Two times a day (BID) | OROMUCOSAL | Status: DC
  Administered 2014-02-19 – 2014-02-20 (×4): 15 mL via OROMUCOSAL
  Filled 2014-02-19 (×3): qty 15

## 2014-02-19 MED ORDER — POTASSIUM CHLORIDE 20 MEQ/15ML (10%) PO SOLN: 40.0000 meq | ORAL | Status: DC

## 2014-02-19 MED ORDER — VITAL AF 1.2 CAL PO LIQD
1000.0000 mL | ORAL | Status: DC
  Administered 2014-02-19 – 2014-02-20 (×2): 1000 mL
  Filled 2014-02-19 (×4): qty 1000

## 2014-02-19 NOTE — Progress Notes (Signed)
PULMONARY / CRITICAL CARE MEDICINE   Name: Alvin Gonzalez MRN: [id number] DOB: 03-04-1960    ADMISSION DATE:  02/18/2014 CONSULTATION DATE:  02/18/14   REFERRING MD :  Selena Lesser  CHIEF COMPLAINT:  AMS, overdose   INITIAL PRESENTATION:  54 y.o PMH HTN, depression, tobacco abuse. Admitted to [facility] with overdose (? Substance ? Heroine, Xanax (GF had Xanax Rx 02/11/14 with 75 pills and bottle was empty) and/or Valium, Elavil, Seroquel, HCTZ, Norvasc, Prozac, cocaine, opiates, tramadol) ?intentional ?suicide attempt with previous h/o suicide attempt. GCS 3 at [place]. Given bicarb and Narcan 2 mg, NS 1L for sbp 90s. Intubated at outside facility   STUDIES:  CXR 1/10>>Developing infiltrate and atelectasis right lower lobe  SIGNIFICANT EVENTS: 1/10 admit, ETT   SUBJECTIVE:  Intubated, sedated, unresponsive  VITAL SIGNS: Temp:  [95.6 F (35.3 C)-99.5 F (37.5 C)] 99.5 F (37.5 C) (01/11 0826) Pulse Rate:  [98-112] 111 (01/11 0800) Resp:  [10-26] 25 (01/11 0800) BP: (96-121)/(49-60) 119/60 mmHg (01/11 0800) SpO2:  [95 %-100 %] 100 % (01/11 0800) FiO2 (%):  [40 %-100 %] 40 % (01/11 0600) Weight:  [160 lb 7.9 oz (72.8 kg)-162 lb 14.7 oz (73.9 kg)] 162 lb 14.7 oz (73.9 kg) (01/11 0406) HEMODYNAMICS:   VENTILATOR SETTINGS: Vent Mode:  [-] PRVC FiO2 (%):  [40 %-100 %] 40 % Set Rate:  [16 bmp-22 bmp] 16 bmp Vt Set:  [450 mL-500 mL] 500 mL PEEP:  [5 cmH20] 5 cmH20 Plateau Pressure:  [11 cmH20-14 cmH20] 12 cmH20 INTAKE / OUTPUT:  Intake/Output Summary (Last 24 hours) at 02/19/14 0836 Last data filed at 02/19/14 0600  Gross per 24 hour  Intake 2081.67 ml  Output   1480 ml  Net 601.67 ml   PHYSICAL EXAMINATION: General:  Lying in bed, on mechanical ventilation Neuro:  sedated w/o medications, not responding to voice or pain HEENT:  Dilated pupils but reactive Cardiovascular: Tachycardia Lungs:  CTA B/L, -wheezing Abdomen:  Soft, +bs Musculoskeletal:  -edema Skin:  Intact,  warm  LABS:  CBC  Recent Labs Lab 02/18/14 1338 02/19/14 0252  WBC 12.5* 12.4*  HGB 12.6* 12.4*  HCT 36.3* 35.4*  PLT 234 247   BMET  Recent Labs Lab 02/18/14 1338 02/18/14 1845 02/19/14 0252  NA 142 142 140  K 3.2* 2.9* 2.7*  CL 112 107 103  CO2 20 23 26   BUN 15 14 12   CREATININE 1.76* 1.39* 1.13  GLUCOSE 186* 171* 167*   Electrolytes  Recent Labs Lab 02/18/14 1338 02/18/14 1845 02/19/14 0252  CALCIUM 7.9* 8.3* 8.1*  MG 1.5  --   --   PHOS 3.4  --   --    Sepsis Markers  Recent Labs Lab 02/18/14 1230 02/18/14 1338  LATICACIDVEN  --  4.3*  PROCALCITON <0.10  --    ABG  Recent Labs Lab 02/18/14 1210 02/19/14 0500  PHART 7.338* 7.513*  PCO2ART 36.5 33.6*  PO2ART 274.0* 148.0*   Liver Enzymes  Recent Labs Lab 02/18/14 1338  AST 39*  ALT 20  ALKPHOS 98  BILITOT 0.3  ALBUMIN 3.0*   Cardiac Enzymes  Recent Labs Lab 02/18/14 1338  TROPONINI <0.03   Glucose  Recent Labs Lab 02/18/14 1156 02/18/14 1558 02/18/14 1942 02/18/14 2350 02/19/14 0327  GLUCAP 150* 171* 173* 143* 140*   Imaging Dg Chest Port 1 View  02/18/2014   CLINICAL DATA:  54 year old male with respiratory failure.  EXAM: PORTABLE CHEST - 1 VIEW  COMPARISON:  No priors.  FINDINGS: An  endotracheal tube is in place with tip 2.1 cm above the carina. A nasogastric tube is seen extending into the stomach, however, the tip of the nasogastric tube extends below the lower margin of the image. Lung volumes are low. No consolidative airspace disease. No pleural effusions. No pneumothorax. No pulmonary nodule or mass noted. Pulmonary vasculature and the cardiomediastinal silhouette are within normal limits.  IMPRESSION: 1. Support apparatus, as above. Endotracheal tube is slightly low, and could be withdrawn 2 cm for more optimal placement. 2. Low lung volumes without radiographic evidence of acute cardiopulmonary disease.   Electronically Signed   By: Vinnie Langton M.D.   On:  02/18/2014 14:21   ASSESSMENT / PLAN:  PULMONARY OETT 1/10>> A: VDRF  Developing infiltrate and atelectasis right lower lobe P:   Full vent support SBT/WUA--weaning  CARDIOVASCULAR CVL none A:  History of tachycardic with QRS widening now NSR. QTc prior to admission was 475, tachycardia Prolonged QT--improving, down to 453  Sl 1st degree AV block  P:  Trend qtc No pressors  RENAL A:   AKI unknown BL Cr. At [place] 2.18--improving, Cr down to 1.13 today  Recent Labs Lab 02/18/14 1338 02/18/14 1845 02/19/14 0252  CREATININE 1.76* 1.39* 1.13   Hypokalemia  P:   Currently on bicarb gtt Replacing K, check Mag Trend BMET Monitor I/O's: +622ml since admission  GASTROINTESTINAL A:   GI px  NPO P:   pepcid 20 bid May start TF   HEMATOLOGIC A:   DVT px  No other issues  P:  Heparin sq,scds   INFECTIOUS A:   Leukocytosis labs [place] 14.8 could be reactive  Procalcitonin <0.1  P:   BCx2 not obtained  UC not obtained  Sputum not obtained  Abx: none   Trend CBC, pending PCT  ENDOCRINE A:   Hyperglycemia P:   Monitor cbgs SSI if starting TF  NEUROLOGIC/PSYCHIATRY A:   Encephalopathy likely due to polysubstance overdose outside labs +TCA Suicide attempt Chronic back pain UDS +benzo, TCA screen positive, salicylate <4, Acetaminophen <10  P:   RASS goal: 0 to -1 currently -5 without any medications Hold sedating medications  Seizure precautions  Sitter when pt awake, suicide precautions, will need psychiatry when awake  Consider CT head   FAMILY  - Updates: Father and sister updated by bedside this AM  - Inter-disciplinary family meet or Palliative Care meeting due by:  02/25/14   Seen with Dr Luz Lex, MD ; [facility] service Mobile [phone].  After 5:30 PM or weekends, call [phone]

## 2014-02-19 NOTE — Progress Notes (Signed)
INITIAL NUTRITION ASSESSMENT  DOCUMENTATION CODES Per approved criteria  -Not Applicable   INTERVENTION:  Initiate TF via OGT with Vital AF 1.2 at 25 ml/h and Prostat 30 ml BID on day 1; on day 2, d/c Prostat and increase to goal rate of 65 ml/h (1560 ml per day) to provide 1872 kcals, 117 gm protein, 1265 ml free water daily.  NUTRITION DIAGNOSIS: Inadequate oral intake related to inability to eat as evidenced by NPO status.   Goal: Intake to meet >90% of estimated nutrition needs.  Monitor:  TF tolerance/adequacy, weight trend, labs, vent status.  Reason for Assessment: MD Consult for TF initiation and management.  54 y.o. male  Admitting Dx: Overdose  ASSESSMENT: Patient was admitted to [facility] with overdose with previous history of suicide attempt. Transferred to [facility] on 1/10.  Discussed patient in ICU rounds today. Received MD Consult for TF initiation and management.  Patient is currently intubated on ventilator support MV: 11.2 L/min Temp (24hrs), Avg:98 F (36.7 C), Min:95.6 F (35.3 C), Max:99.5 F (37.5 C)  Propofol: none  Height: Ht Readings from Last 1 Encounters:  02/18/14 5\' 4"  (1.626 m)    Weight: Wt Readings from Last 1 Encounters:  02/19/14 162 lb 14.7 oz (73.9 kg)    Ideal Body Weight: 59.1 kg  % Ideal Body Weight: 125%  Wt Readings from Last 10 Encounters:  02/19/14 162 lb 14.7 oz (73.9 kg)  10/29/13 150 lb (68.04 kg)    Usual Body Weight: 150 lb  % Usual Body Weight: 108%  BMI:  Body mass index is 27.95 kg/(m^2).  Estimated Nutritional Needs: Kcal: 1846 Protein: 100-120 gm Fluid: 2 L  Skin: WDL  Diet Order: Diet NPO time specified  EDUCATION NEEDS: -Education not appropriate at this time   Intake/Output Summary (Last 24 hours) at 02/19/14 1134 Last data filed at 02/19/14 1000  Gross per 24 hour  Intake 2411.67 ml  Output   1715 ml  Net 696.67 ml    Last BM: None documented since admission   Labs:   Recent  Labs Lab 02/18/14 1338 02/18/14 1845 02/19/14 0252  NA 142 142 140  K 3.2* 2.9* 2.7*  CL 112 107 103  CO2 20 23 26   BUN 15 14 12   CREATININE 1.76* 1.39* 1.13  CALCIUM 7.9* 8.3* 8.1*  MG 1.5  --   --   PHOS 3.4  --   --   GLUCOSE 186* 171* 167*    CBG (last 3)   Recent Labs  02/18/14 2350 02/19/14 0327 02/19/14 0825  GLUCAP 143* 140* 151*    Scheduled Meds: . antiseptic oral rinse  7 mL Mouth Rinse QID  . chlorhexidine  15 mL Mouth Rinse BID  . famotidine  20 mg Per Tube BID  . feeding supplement (VITAL HIGH PROTEIN)  1,000 mL Per Tube Q24H  . heparin  5,000 Units Subcutaneous 3 times per day    Continuous Infusions: .  sodium bicarbonate  infusion 1000 mL 100 mL/hr at 02/19/14 2703    Past Medical History  Diagnosis Date  . Hypertension   . Tobacco abuse   . Depression   . Suicide attempt     prior to 02/18/14 admission   . Chronic back pain     Past Surgical History  Procedure Laterality Date  . Lymph node biopsy      Molli Barrows, RD, LDN, [place] Pager [phone] After Hours Pager [phone]

## 2014-02-19 NOTE — Progress Notes (Signed)
Patient transported to CT and back to room 2M11 without any complications.

## 2014-02-19 NOTE — Progress Notes (Signed)
[facility] Progress Note Patient Name: Alvin Gonzalez DOB: 1960/12/24 MRN: [id number]   Date of Service  02/19/2014  HPI/Events of Note  Pt acutely agitated  eICU Interventions  Try prn fentanyl first      Intervention Category Major Interventions: Delirium, psychosis, severe agitation - evaluation and management  Asencion Noble 02/19/2014, 5:15 PM

## 2014-02-19 NOTE — Progress Notes (Signed)
UR Completed.  [phone]  

## 2014-02-20 ENCOUNTER — Inpatient Hospital Stay (HOSPITAL_COMMUNITY): Payer: MEDICAID

## 2014-02-20 LAB — BASIC METABOLIC PANEL
Anion gap: 8 (ref 5–15)
BUN: 12 mg/dL (ref 6–23)
CO2: 26 mmol/L (ref 19–32)
CREATININE: 0.88 mg/dL (ref 0.50–1.35)
Calcium: 8.5 mg/dL (ref 8.4–10.5)
Chloride: 107 mEq/L (ref 96–112)
GFR calc non Af Amer: >90 mL/min (ref >90)
GLUCOSE: 127 mg/dL — AB (ref 70–99)
POTASSIUM: 3.4 mmol/L — AB (ref 3.5–5.1)
SODIUM: 141 mmol/L (ref 135–145)

## 2014-02-20 LAB — MAGNESIUM: Magnesium: 2.2 mg/dL (ref 1.5–2.5)

## 2014-02-20 LAB — GLUCOSE, CAPILLARY
GLUCOSE-CAPILLARY: 134 mg/dL — AB (ref 70–99)
GLUCOSE-CAPILLARY: 138 mg/dL — AB (ref 70–99)
Glucose-Capillary: 108 mg/dL — ABNORMAL HIGH (ref 70–99)
Glucose-Capillary: 109 mg/dL — ABNORMAL HIGH (ref 70–99)
Glucose-Capillary: 115 mg/dL — ABNORMAL HIGH (ref 70–99)
Glucose-Capillary: 116 mg/dL — ABNORMAL HIGH (ref 70–99)
Glucose-Capillary: 126 mg/dL — ABNORMAL HIGH (ref 70–99)

## 2014-02-20 MED ORDER — CETYLPYRIDINIUM CHLORIDE 0.05 % MT LIQD
7.0000 mL | Freq: Two times a day (BID) | OROMUCOSAL | Status: DC
  Administered 2014-02-20 – 2014-02-25 (×8): 7 mL via OROMUCOSAL

## 2014-02-20 MED ORDER — CEFTRIAXONE SODIUM IN DEXTROSE 40 MG/ML IV SOLN
2.0000 g | INTRAVENOUS | Status: DC
  Administered 2014-02-20 – 2014-02-21 (×2): 2 g via INTRAVENOUS
  Filled 2014-02-20 (×3): qty 50

## 2014-02-20 MED ORDER — POTASSIUM CHLORIDE 20 MEQ/15ML (10%) PO SOLN
40.0000 meq | Freq: Two times a day (BID) | ORAL | Status: DC
  Administered 2014-02-20: 40 meq via ORAL
  Filled 2014-02-20 (×2): qty 30

## 2014-02-20 MED ORDER — POTASSIUM CHLORIDE 20 MEQ/15ML (10%) PO SOLN
40.0000 meq | Freq: Two times a day (BID) | ORAL | Status: AC
  Administered 2014-02-20: 40 meq via ORAL
  Filled 2014-02-20: qty 30

## 2014-02-20 NOTE — Procedures (Signed)
Extubation Procedure Note  Patient Details:   Name: Joshuajames Moehring DOB: 12-Mar-1960 MRN: [id number]   Airway Documentation:     Evaluation  O2 sats: stable throughout Complications: No apparent complications Patient did tolerate procedure well. Bilateral Breath Sounds: Clear, Diminished Suctioning: Oral, Airway Yes   Pt extubated to 2L [place]. Pt stable throughout with no complications. Pt did not have cuff leak prior to extubation but ok to go ahead per Dr Alva Garnet. Pt unable to speak name post extubation due to not being fully awake/alert, but pt trying to speak words. RN aware. Rt will continue to monitor.   Jesse Sans 02/20/2014, 11:52 AM

## 2014-02-20 NOTE — Procedures (Signed)
EEG report.  Brief clinical history:  54 y.o PMH HTN, depression, tobacco abuse. Admitted to [facility] with overdose (? Substance ? Heroine, Xanax (GF had Xanax Rx 02/11/14 with 75 pills and bottle was empty) and/or Valium, Elavil, Seroquel, HCTZ, Norvasc, Prozac, cocaine, opiates, tramadol) ?intentional ?suicide attempt with previous h/o suicide attempt. GCS 3 at [place]. Given bicarb and Narcan 2 mg, NS 1L for sbp 90s. Intubated at outside facility   Technique: this is a 17 channel routine scalp EEG performed at the bedside in the ICU, with bipolar and monopolar montages arranged in accordance to the international 10/20 system of electrode placement. One channel was dedicated to EKG recording.  The patient is intubated, sedated, on the vent. No activating procedures performed.  Description: as the study opens and throughout the entire recording, there is evidence of a diffuse and continuous pattern that seems to be consistent with sedation and drowsiness. No electrographic seizures noted. No epileptiform discharges seen. EKG showed sinus rhythm.  Impression: this is a normal drowsy, sedated EEG. No electrographic seizures noted. Clinical correlation is advised.   Dorian Pod, MD [facility]

## 2014-02-20 NOTE — Progress Notes (Signed)
EEG completed, results pending. 

## 2014-02-20 NOTE — Progress Notes (Signed)
PULMONARY / CRITICAL CARE MEDICINE   Name: Alvin Gonzalez MRN: [id number] DOB: [birth year]    ADMISSION DATE:  02/18/2014 CONSULTATION DATE:  02/18/14   REFERRING MD :  Selena Lesser  CHIEF COMPLAINT:  AMS, overdose   INITIAL PRESENTATION:  54 y.o PMH HTN, depression, tobacco abuse. Admitted to [facility] with overdose (? Substance ? Heroine, Xanax (GF had Xanax Rx 02/11/14 with 75 pills and bottle was empty) and/or Valium, Elavil, Seroquel, HCTZ, Norvasc, Prozac, cocaine, opiates, tramadol) ?intentional ?suicide attempt with previous h/o suicide attempt. GCS 3 at [place]. Given bicarb and Narcan 2 mg, NS 1L for sbp 90s. Intubated at outside facility   STUDIES:  CXR 1/10 and 1/11>Developing infiltrate and atelectasis right lower lobe CT head 1/11: normal.   SIGNIFICANT EVENTS: 1/10 admit, ETT  1/11 - remains sedated and unresponsive.  Overnight: had some agitation overnight, gave 1x prn fentanyl. SUBJECTIVE:  Intubated, sedated, unresponsive  VITAL SIGNS: Temp:  [98.4 F (36.9 C)-99.6 F (37.6 C)] 98.4 F (36.9 C) (01/12 0734) Pulse Rate:  [97-116] 101 (01/12 0700) Resp:  [5-30] 15 (01/12 0700) BP: (106-126)/(56-78) 115/66 mmHg (01/12 0700) SpO2:  [95 %-100 %] 96 % (01/12 0700) FiO2 (%):  [40 %] 40 % (01/12 0400) Weight:  [74.1 kg (163 lb 5.8 oz)] 74.1 kg (163 lb 5.8 oz) (01/12 0500) HEMODYNAMICS:   VENTILATOR SETTINGS: Vent Mode:  [-] PRVC FiO2 (%):  [40 %] 40 % Set Rate:  [16 bmp] 16 bmp Vt Set:  [500 mL] 500 mL PEEP:  [5 cmH20] 5 cmH20 Pressure Support:  [5 cmH20] 5 cmH20 Plateau Pressure:  [14 cmH20] 14 cmH20 INTAKE / OUTPUT:  Intake/Output Summary (Last 24 hours) at 02/20/14 0745 Last data filed at 02/20/14 0700  Gross per 24 hour  Intake 1305.01 ml  Output   1315 ml  Net  -9.99 ml   PHYSICAL EXAMINATION: General:  Lying in bed, on mechanical ventilation Neuro:  sedated w/o medications, not responding to voice or pain HEENT:  Dilated pupils but  reactive Cardiovascular: reg rate, tachy Lungs:  CTA B/L, Abdomen:  Soft, +bs Musculoskeletal:  -edema Skin:  Intact, warm  LABS:  CBC  Recent Labs Lab 02/18/14 1338 02/19/14 0252  WBC 12.5* 12.4*  HGB 12.6* 12.4*  HCT 36.3* 35.4*  PLT 234 247   BMET  Recent Labs Lab 02/19/14 0252 02/19/14 1148 02/20/14 0305  NA 140 141 141  K 2.7* 3.1* 3.4*  CL 103 106 107  CO2 26 26 26   BUN 12 11 12   CREATININE 1.13 1.00 0.88  GLUCOSE 167* 139* 127*   Electrolytes  Recent Labs Lab 02/18/14 1338  02/19/14 0252 02/19/14 1148 02/20/14 0305  CALCIUM 7.9*  < > 8.1* 8.3* 8.5  MG 1.5  --   --  1.6 2.2  PHOS 3.4  --   --   --   --   < > = values in this interval not displayed. Sepsis Markers  Recent Labs Lab 02/18/14 1230 02/18/14 1338  LATICACIDVEN  --  4.3*  PROCALCITON <0.10  --    ABG  Recent Labs Lab 02/18/14 1210 02/19/14 0500  PHART 7.338* 7.513*  PCO2ART 36.5 33.6*  PO2ART 274.0* 148.0*   Liver Enzymes  Recent Labs Lab 02/18/14 1338  AST 39*  ALT 20  ALKPHOS 98  BILITOT 0.3  ALBUMIN 3.0*   Cardiac Enzymes  Recent Labs Lab 02/18/14 1338  TROPONINI <0.03   Glucose  Recent Labs Lab 02/19/14 0825 02/19/14 1151 02/19/14 1548 02/19/14 1948  02/19/14 2353 02/20/14 0401  GLUCAP 151* 140* 135* 160* 126* 134*   Imaging Ct Head Wo Contrast  02/19/2014   CLINICAL DATA:  Recent overdose, unresponsive.  EXAM: CT HEAD WITHOUT CONTRAST  TECHNIQUE: Contiguous axial images were obtained from the base of the skull through the vertex without intravenous contrast.  COMPARISON:  10/27/2013.  FINDINGS: No evidence of an acute infarct, acute hemorrhage, mass lesion, mass effect or hydrocephalus. No air-fluid levels in the visualized portions of the paranasal sinuses are mastoid air cells.  IMPRESSION: Normal brain.   Electronically Signed   By: Lorin Picket M.D.   On: 02/19/2014 13:00   Dg Chest Port 1 View  02/19/2014   CLINICAL DATA:  Respiratory  failure  EXAM: PORTABLE CHEST - 1 VIEW  COMPARISON:  02/18/2014.  FINDINGS: Endotracheal tube, NG tube in stable position. Developing infiltrate and atelectasis right lung base. No pleural effusion or pneumothorax. Heart size normal. No acute osseus abnormality.  IMPRESSION: 1.  Lines and tubes in stable position.  2.  Developing infiltrate and atelectasis right lower lobe.   Electronically Signed   By: Marcello Moores  Register   On: 02/19/2014 07:22   ASSESSMENT / PLAN:  PULMONARY OETT 1/10>> A: VDRF  Developing infiltrate and atelectasis right lower lobe P:   Full vent support now. Extubate today.   CARDIOVASCULAR CVL none A:  History of tachycardic with QRS widening now NSR. QTc prior to admission was 475, tachycardia Prolonged QT--improving, down to 453  1st degree AV block  P:  Trend qtc No pressors  RENAL A:   AKI likely pre-renal - unknown BL Cr. At [place] 2.18--improving, Cr down to 0.88 today Hypomag, hypo K+   Recent Labs Lab 02/18/14 1338 02/18/14 1845 02/19/14 0252 02/19/14 1148 02/20/14 0305  CREATININE 1.76* 1.39* 1.13 1.00 0.88   Hypokalemia  P:   Off bicarb drip now, pH 7.513 yesterday on ABG. Replete K (added 40mg  per tube BID) and Mag as needed. Trend BMET  GASTROINTESTINAL A:   GI px  NPO P:   pepcid 20 bid On TF  HEMATOLOGIC A:   DVT px  No other issues  P:  Heparin sq,scds   INFECTIOUS A:   Leukocytosis labs [place] 14.8 could be reactive  Procalcitonin <0.1  P:   BCx2 not obtained  UC not obtained  Sent sputum culture because increased sputum 1/12 >>>  Ceftriaxone 1/12 >>>  Trend CBC  ENDOCRINE A:   Hyperglycemia P:   Monitor cbgs SSI if starting TF  NEUROLOGIC/PSYCHIATRY A:   Encephalopathy likely due to polysubstance overdose outside labs +TCA. Suicide attempt Chronic back pain UDS +benzo, TCA screen positive, salicylate <4, Acetaminophen <10  P:   RASS goal: 0 to -1. AVOID ALL SEDATIVES. Sedated this morning  likely 2/2 to fentanyl given overnight.  Consider EEG today to rule out seizure in the setting of TCA overdose.   Seizure precautions  Sitter, suicide precautions, will need psychiatry when awake  Consider ruling out other reversible causes such as vit b12, folate, tsh?  FAMILY  - Updates: Father and sister updated by bedside 1/11  - Inter-disciplinary family meet or Palliative Care meeting due by:  02/25/14  Dellia Nims, M.D PGY-1 Internal Medicine Pager [phone]  PCCM ATTENDING: I have reviewed pt's initial presentation, consultants notes and hospital database in detail.  The above assessment and plan was formulated under my direction.  Now extubated and tolerating  Merton Border, MD;  PCCM service; Mobile [phone]

## 2014-02-20 NOTE — Clinical Social Work Note (Signed)
Clinical Social Worker received consult for intentional overdose. Patient disoriented/ extubated today. CSW unable to assess.   CSW to follow pt to complete psychosocial assessment.   Glendon Axe, MSW, LCSWA [phone] 02/20/2014 4:16 PM

## 2014-02-20 NOTE — Progress Notes (Addendum)
ANTIBIOTIC CONSULT NOTE - INITIAL  Pharmacy Consult for Ceftriaxone Indication: Possible PNA  Allergies  Allergen Reactions  . Ibuprofen Nausea And Vomiting  . Tegretol [Carbamazepine] Rash    Patient Measurements: Height: 5\' 4"  (162.6 cm) Weight: 163 lb 5.8 oz (74.1 kg) IBW/kg (Calculated) : 59.2  Vital Signs: Temp: 98.4 F (36.9 C) (01/12 0734) Temp Source: Oral (01/12 0734) BP: 119/71 mmHg (01/12 0833) Pulse Rate: 106 (01/12 0833) Intake/Output from previous day: 01/11 0701 - 01/12 0700 In: 1305 [I.V.:640; NG/GT:615; IV Piggyback:50] Out: 1388 [Urine:1315] Intake/Output from this shift:    Labs:  Recent Labs  02/18/14 1332 02/18/14 1338  02/19/14 0252 02/19/14 1148 02/20/14 0305  WBC  --  12.5*  --  12.4*  --   --   HGB  --  12.6*  --  12.4*  --   --   PLT  --  234  --  247  --   --   LABCREA 171.89  --   --   --   --   --   CREATININE  --  1.76*  < > 1.13 1.00 0.88  < > = values in this interval not displayed. Estimated Creatinine Clearance: 91.6 mL/min (by C-G formula based on Cr of 0.88). No results for input(s): VANCOTROUGH, VANCOPEAK, VANCORANDOM, GENTTROUGH, GENTPEAK, GENTRANDOM, TOBRATROUGH, TOBRAPEAK, TOBRARND, AMIKACINPEAK, AMIKACINTROU, AMIKACIN in the last 72 hours.   Microbiology: Recent Results (from the past 720 hour(s))  MRSA PCR Screening     Status: None   Collection Time: 02/18/14 11:33 AM  Result Value Ref Range Status   MRSA by PCR NEGATIVE NEGATIVE Final    Comment:        The GeneXpert MRSA Assay (FDA approved for NASAL specimens only), is one component of a comprehensive MRSA colonization surveillance program. It is not intended to diagnose MRSA infection nor to guide or monitor treatment for MRSA infections.     Medical History: Past Medical History  Diagnosis Date  . Hypertension   . Tobacco abuse   . Depression   . Suicide attempt     prior to 02/18/14 admission   . Chronic back pain    Assessment: 54 yo M  presents from [facility] on 1/10 with AMS and possible overdose of unknown substance. CXR showed developing infiltrate in the RLL. Afebrile, WBC slightly elevated at 12.4. Pharmacy asked to dose ceftriaxone for possible PNA.  Goal of Therapy:  Eradication of infection  Plan:  Start ceftriaxone 2g IV Q24 Monitor WBC, clinical picture, CXR F/U abx LOT  No renal adjustments necessary. Pharmacy will sign off for now. Thanks.  Skylene Deremer J 02/20/2014,10:38 AM

## 2014-02-21 ENCOUNTER — Inpatient Hospital Stay (HOSPITAL_COMMUNITY): Payer: Self-pay

## 2014-02-21 DIAGNOSIS — T1491 Suicide attempt: Secondary | ICD-10-CM

## 2014-02-21 DIAGNOSIS — T50901A Poisoning by unspecified drugs, medicaments and biological substances, accidental (unintentional), initial encounter: Secondary | ICD-10-CM

## 2014-02-21 DIAGNOSIS — J189 Pneumonia, unspecified organism: Secondary | ICD-10-CM

## 2014-02-21 DIAGNOSIS — F332 Major depressive disorder, recurrent severe without psychotic features: Secondary | ICD-10-CM

## 2014-02-21 LAB — BASIC METABOLIC PANEL
Anion gap: 8 (ref 5–15)
BUN: 17 mg/dL (ref 6–23)
CALCIUM: 8.4 mg/dL (ref 8.4–10.5)
CO2: 24 mmol/L (ref 19–32)
Chloride: 109 mEq/L (ref 96–112)
Creatinine, Ser: 0.97 mg/dL (ref 0.50–1.35)
GFR calc Af Amer: >90 mL/min (ref >90)
GFR calc non Af Amer: >90 mL/min (ref >90)
GLUCOSE: 108 mg/dL — AB (ref 70–99)
POTASSIUM: 3.7 mmol/L (ref 3.5–5.1)
SODIUM: 141 mmol/L (ref 135–145)

## 2014-02-21 LAB — GLUCOSE, CAPILLARY
Glucose-Capillary: 106 mg/dL — ABNORMAL HIGH (ref 70–99)
Glucose-Capillary: 103 mg/dL — ABNORMAL HIGH (ref 70–99)
Glucose-Capillary: 111 mg/dL — ABNORMAL HIGH (ref 70–99)

## 2014-02-21 MED ORDER — QUETIAPINE FUMARATE 25 MG PO TABS
25.0000 mg | ORAL_TABLET | Freq: Three times a day (TID) | ORAL | Status: DC
  Administered 2014-02-21 – 2014-02-22 (×4): 25 mg via ORAL
  Filled 2014-02-21 (×18): qty 1

## 2014-02-21 MED ORDER — IPRATROPIUM-ALBUTEROL 0.5-2.5 (3) MG/3ML IN SOLN
3.0000 mL | RESPIRATORY_TRACT | Status: DC
  Administered 2014-02-21: 3 mL via RESPIRATORY_TRACT
  Filled 2014-02-21: qty 3

## 2014-02-21 MED ORDER — CITALOPRAM HYDROBROMIDE 20 MG PO TABS
20.0000 mg | ORAL_TABLET | Freq: Every day | ORAL | Status: DC
  Administered 2014-02-22 – 2014-02-23 (×2): 20 mg via ORAL
  Filled 2014-02-21 (×6): qty 1

## 2014-02-21 MED ORDER — IPRATROPIUM-ALBUTEROL 0.5-2.5 (3) MG/3ML IN SOLN
RESPIRATORY_TRACT | Status: AC
  Filled 2014-02-21: qty 3

## 2014-02-21 MED ORDER — IPRATROPIUM-ALBUTEROL 0.5-2.5 (3) MG/3ML IN SOLN: 3.0000 mL | RESPIRATORY_TRACT | Status: DC | PRN

## 2014-02-21 MED ORDER — POTASSIUM CHLORIDE 10 MEQ/100ML IV SOLN
10.0000 meq | INTRAVENOUS | Status: AC
  Administered 2014-02-21 (×4): 10 meq via INTRAVENOUS
  Filled 2014-02-21 (×5): qty 100

## 2014-02-21 MED ORDER — IPRATROPIUM-ALBUTEROL 0.5-2.5 (3) MG/3ML IN SOLN
3.0000 mL | Freq: Once | RESPIRATORY_TRACT | Status: AC
  Administered 2014-02-21: 3 mL via RESPIRATORY_TRACT

## 2014-02-21 MED ORDER — DIAZEPAM 2 MG PO TABS
1.0000 mg | ORAL_TABLET | Freq: Four times a day (QID) | ORAL | Status: DC
  Administered 2014-02-21 – 2014-02-23 (×10): 1 mg via ORAL
  Filled 2014-02-21 (×10): qty 1

## 2014-02-21 MED ORDER — IPRATROPIUM-ALBUTEROL 0.5-2.5 (3) MG/3ML IN SOLN: 3.0000 mL | RESPIRATORY_TRACT | Status: DC

## 2014-02-21 MED ORDER — ENOXAPARIN SODIUM 40 MG/0.4ML ~~LOC~~ SOLN
40.0000 mg | SUBCUTANEOUS | Status: DC
Start: 2014-02-21 — End: 2014-02-26
  Administered 2014-02-21 – 2014-02-26 (×6): 40 mg via SUBCUTANEOUS
  Filled 2014-02-21 (×6): qty 0.4

## 2014-02-21 MED ORDER — LORAZEPAM 2 MG/ML IJ SOLN
0.5000 mg | INTRAMUSCULAR | Status: DC | PRN
  Administered 2014-02-21 – 2014-02-23 (×10): 1 mg via INTRAVENOUS
  Filled 2014-02-21 (×10): qty 1

## 2014-02-21 NOTE — Progress Notes (Signed)
Pt w/ increased oxygen demand, SPO2 88-92% - pt titrated up to 55% FiO2 on venturi mask. Gareth Morgan, MD notified of change in pt status - T.O. Given for duoneb x1 stat. Pt alert - pt w/ incomprehensible speech, not currently answering questions to evaluate mental status and orientation. Will continue to monitor.

## 2014-02-21 NOTE — Progress Notes (Signed)
CRITICAL VALUE ALERT  Critical value received: amitriptyline/ nortriptyline 1448  Date of notification:  02/21/14  Time of notification:  3128  Critical value read back :yes  Nurse who received alert:  Audie Pinto     MD notified (1st page):  Mclean at bedside  Time of first page:    MD notified (2nd page):  Time of second page:  Responding MD:  Aundra Dubin at bedside  Time MD responded:  1500

## 2014-02-21 NOTE — Consult Note (Signed)
[facility] Consult   Reason for Consult:  Suicide attempt with TCA and benzo's Referring Physician:  Wilhelmina Mcardle, MD  Alvin Gonzalez is an 54 y.o. male. Total Time spent with patient: 45 minutes  Assessment: AXIS I:  Major Depression, Recurrent severe AXIS II:  Deferred AXIS III:   Past Medical History  Diagnosis Date  . Hypertension   . Tobacco abuse   . Depression   . Suicide attempt     prior to 02/18/14 admission   . Chronic back pain    AXIS IV:  other psychosocial or environmental problems, problems related to social environment and problems with primary support group AXIS V:  41-50 serious symptoms  Plan: Recommend psychiatric Inpatient admission when medically cleared. Supportive therapy provided about ongoing stressors.  Appreciate psychiatric consultation Please contact [phone] or [phone] if needs further assistance   Subjective:   Alvin Gonzalez is a 54 y.o. male patient admitted with suicide attempt  HPI:  Patient seen, chart reviewed and case discussed with his wife who was at bed side along with safety sitter. Patient is awake but not able to provide history due to respiratory failure and status post extubation recently. Patient appeared restless in his bed and trying to get out of bed and needed redirection and calm him down few times. Patient wife stated that he was referred to out patient psych by his parole officer, and was evaluated initially at [facility] and later at [facility] center in [place] who provided medication like Elavil and Sinequan for depression and insomnia. Patient had history of crack cocaine, heroin and his last use was six months ago. He was incarcerated for domestic violence. Reportedly fell from tree while working about few months ago and visited ER and no fractures as per records. He has no children. He has no previous suicide attempts. His wife found him with insomnia and later in the morning with AMS and believed he was  overdosed his Elavil and may be sinequan (unknown amount). Patient UDS is positive for TCA and benzo's.   Medical history: Patient with back pain, depression, tobacco abuse. Admitted to [facility] with overdose (? Substance ? Heroine, Xanax (GF had Xanax Rx 02/11/14 with 75 pills and bottle was empty) and/or Valium, Elavil, sinequan, HCTZ, Norvasc, Prozac, cocaine, opiates, tramadol) ?intentional ?suicide attempt with previous h/o suicide attempt. GCS 3 at [place]. Given bicarb and Narcan 2 mg, NS 1L for sbp 90s. Intubated at outside facility   HPI Elements:   Location:  depression and irritability. Quality:  poor. Severity:  suicide attempt. Timing:  severe psychosocial stresses.  Past Psychiatric History: Past Medical History  Diagnosis Date  . Hypertension   . Tobacco abuse   . Depression   . Suicide attempt     prior to 02/18/14 admission   . Chronic back pain     reports that he has been smoking Cigarettes.  He has been smoking about 0.50 packs per day. He does not have any smokeless tobacco history on file. He reports that he drinks alcohol. He reports that he does not use illicit drugs. No family history on file.         Allergies:   Allergies  Allergen Reactions  . Ibuprofen Nausea And Vomiting  . Tegretol [Carbamazepine] Rash    ACT Assessment Complete:  NO Objective: Blood pressure 104/83, pulse 118, temperature 98.3 F (36.8 C), temperature source Oral, resp. rate 24, height 5' 4"  (1.626 m), weight 74.1 kg (163 lb  5.8 oz), SpO2 87 %.Body mass index is 28.03 kg/(m^2). Results for orders placed or performed during the hospital encounter of 02/18/14 (from the past 72 hour(s))  MRSA PCR Screening     Status: None   Collection Time: 02/18/14 11:33 AM  Result Value Ref Range   MRSA by PCR NEGATIVE NEGATIVE    Comment:        The GeneXpert MRSA Assay (FDA approved for NASAL specimens only), is one component of a comprehensive MRSA colonization surveillance program. It  is not intended to diagnose MRSA infection nor to guide or monitor treatment for MRSA infections.   Glucose, capillary     Status: Abnormal   Collection Time: 02/18/14 11:56 AM  Result Value Ref Range   Glucose-Capillary 150 (H) 70 - 99 mg/dL  Blood gas, arterial     Status: Abnormal   Collection Time: 02/18/14 12:10 PM  Result Value Ref Range   FIO2 1.00 %   Delivery systems VENTILATOR    Mode PRESSURE REGULATED VOLUME CONTROL    VT 450 mL   Rate 22 resp/min   Peep/cpap 5.0 cm H20   pH, Arterial 7.338 (L) 7.350 - 7.450   pCO2 arterial 36.5 35.0 - 45.0 mmHg   pO2, Arterial 274.0 (H) 80.0 - 100.0 mmHg   Bicarbonate 19.1 (L) 20.0 - 24.0 mEq/L   TCO2 20.2 0 - 100 mmol/L   Acid-base deficit 5.7 (H) 0.0 - 2.0 mmol/L   O2 Saturation 99.1 %   Patient temperature 98.6    Collection site LEFT RADIAL    Drawn by [id number]    Sample type ARTERIAL DRAW    Allens test (pass/fail) PASS PASS  Urinalysis, Routine w reflex microscopic     Status: Abnormal   Collection Time: 02/18/14 12:23 PM  Result Value Ref Range   Color, Urine YELLOW YELLOW   APPearance CLEAR CLEAR   Specific Gravity, Urine 1.019 1.005 - 1.030   pH 5.5 5.0 - 8.0   Glucose, UA NEGATIVE NEGATIVE mg/dL   Hgb urine dipstick LARGE (A) NEGATIVE   Bilirubin Urine NEGATIVE NEGATIVE   Ketones, ur NEGATIVE NEGATIVE mg/dL   Protein, ur 30 (A) NEGATIVE mg/dL   Urobilinogen, UA 0.2 0.0 - 1.0 mg/dL   Nitrite NEGATIVE NEGATIVE   Leukocytes, UA NEGATIVE NEGATIVE  Urine microscopic-add on     Status: None   Collection Time: 02/18/14 12:23 PM  Result Value Ref Range   RBC / HPF 3-6 <3 RBC/hpf  Tricyclics screen, urine     Status: Abnormal   Collection Time: 02/18/14 12:24 PM  Result Value Ref Range   TCA Scrn POSITIVE (A) NONE DETECTED    Comment:        LOWEST DETECTABLE LIMITS FOR URINE DRUG SCREEN Drug Class       Cutoff (ng/mL) Tricyclics       350   Urine rapid drug screen (hosp performed)     Status: Abnormal    Collection Time: 02/18/14 12:24 PM  Result Value Ref Range   Opiates NONE DETECTED NONE DETECTED   Cocaine NONE DETECTED NONE DETECTED   Benzodiazepines POSITIVE (A) NONE DETECTED   Amphetamines NONE DETECTED NONE DETECTED   Tetrahydrocannabinol NONE DETECTED NONE DETECTED   Barbiturates NONE DETECTED NONE DETECTED    Comment:        DRUG SCREEN FOR MEDICAL PURPOSES ONLY.  IF CONFIRMATION IS NEEDED FOR ANY PURPOSE, NOTIFY LAB WITHIN 5 DAYS.        LOWEST DETECTABLE LIMITS FOR  URINE DRUG SCREEN Drug Class       Cutoff (ng/mL) Amphetamine      1000 Barbiturate      200 Benzodiazepine   492 Tricyclics       010 Opiates          300 Cocaine          300 THC              50   Procalcitonin     Status: None   Collection Time: 02/18/14 12:30 PM  Result Value Ref Range   Procalcitonin <0.10 ng/mL    Comment:        Interpretation: PCT (Procalcitonin) <= 0.5 ng/mL: Systemic infection (sepsis) is not likely. Local bacterial infection is possible. REPEATED TO VERIFY (NOTE)         ICU PCT Algorithm               Non ICU PCT Algorithm    ----------------------------     ------------------------------         PCT < 0.25 ng/mL                 PCT < 0.1 ng/mL     Stopping of antibiotics            Stopping of antibiotics       strongly encouraged.               strongly encouraged.    ----------------------------     ------------------------------       PCT level decrease by               PCT < 0.25 ng/mL       >= 80% from peak PCT       OR PCT 0.25 - 0.5 ng/mL          Stopping of antibiotics                                             encouraged.     Stopping of antibiotics           encouraged.    ----------------------------     ------------------------------       PCT level decrease by              PCT >= 0.25 ng/mL       < 80% from peak PCT        AND PCT >= 0.5 ng/mL             Continuing antibiotics                                              encouraged.        Continuing antibiotics            encouraged.    ----------------------------     ------------------------------     PCT level increase compared          PCT > 0.5 ng/mL         with peak PCT AND          PCT >= 0.5 ng/mL             Escalation of antibiotics  strongly encouraged.      Escalation of antibiotics        strongly encouraged.   Creatinine, urine, random     Status: None   Collection Time: 02/18/14  1:32 PM  Result Value Ref Range   Creatinine, Urine 171.89 mg/dL  Urea nitrogen, urine     Status: None   Collection Time: 02/18/14  1:32 PM  Result Value Ref Range   Urea Nitrogen, Ur 507 mg/dL    Comment: (NOTE)   No established reference range for random urine. Performed at [facility]   Calcium, ionized     Status: None   Collection Time: 02/18/14  1:38 PM  Result Value Ref Range   Calcium, Ion 1.15 1.12 - 1.32 mmol/L    Comment: Performed at [facility] metabolic panel     Status: Abnormal   Collection Time: 02/18/14  1:38 PM  Result Value Ref Range   Sodium 142 135 - 145 mmol/L    Comment: Please note change in reference range.   Potassium 3.2 (L) 3.5 - 5.1 mmol/L    Comment: Please note change in reference range.   Chloride 112 96 - 112 mEq/L   CO2 20 19 - 32 mmol/L   Glucose, Bld 186 (H) 70 - 99 mg/dL   BUN 15 6 - 23 mg/dL   Creatinine, Ser 1.76 (H) 0.50 - 1.35 mg/dL   Calcium 7.9 (L) 8.4 - 10.5 mg/dL   Total Protein 5.4 (L) 6.0 - 8.3 g/dL   Albumin 3.0 (L) 3.5 - 5.2 g/dL   AST 39 (H) 0 - 37 U/L   ALT 20 0 - 53 U/L   Alkaline Phosphatase 98 39 - 117 U/L   Total Bilirubin 0.3 0.3 - 1.2 mg/dL   GFR calc non Af Amer 43 (L) >90 mL/min   GFR calc Af Amer 50 (L) >90 mL/min    Comment: (NOTE) The eGFR has been calculated using the CKD EPI equation. This calculation has not been validated in all clinical situations. eGFR's persistently <90 mL/min signify possible Chronic  Kidney Disease.    Anion gap 10 5 - 15  Magnesium     Status: None   Collection Time: 02/18/14  1:38 PM  Result Value Ref Range   Magnesium 1.5 1.5 - 2.5 mg/dL  Phosphorus     Status: None   Collection Time: 02/18/14  1:38 PM  Result Value Ref Range   Phosphorus 3.4 2.3 - 4.6 mg/dL  Troponin I     Status: None   Collection Time: 02/18/14  1:38 PM  Result Value Ref Range   Troponin I <0.03 <0.031 ng/mL    Comment:        NO INDICATION OF MYOCARDIAL INJURY. Please note change in reference range.   Lactic acid, plasma     Status: Abnormal   Collection Time: 02/18/14  1:38 PM  Result Value Ref Range   Lactic Acid, Venous 4.3 (H) 0.5 - 2.2 mmol/L  CBC WITH DIFFERENTIAL     Status: Abnormal   Collection Time: 02/18/14  1:38 PM  Result Value Ref Range   WBC 12.5 (H) 4.0 - 10.5 K/uL   RBC 4.46 4.22 - 5.81 MIL/uL   Hemoglobin 12.6 (L) 13.0 - 17.0 g/dL   HCT 36.3 (L) 39.0 - 52.0 %   MCV 81.4 78.0 - 100.0 fL   MCH 28.3 26.0 - 34.0 pg   MCHC 34.7 30.0 - 36.0 g/dL   RDW  13.8 11.5 - 15.5 %   Platelets 234 150 - 400 K/uL   Neutrophils Relative % 89 (H) 43 - 77 %   Neutro Abs 11.0 (H) 1.7 - 7.7 K/uL   Lymphocytes Relative 6 (L) 12 - 46 %   Lymphs Abs 0.8 0.7 - 4.0 K/uL   Monocytes Relative 5 3 - 12 %   Monocytes Absolute 0.7 0.1 - 1.0 K/uL   Eosinophils Relative 0 0 - 5 %   Eosinophils Absolute 0.0 0.0 - 0.7 K/uL   Basophils Relative 0 0 - 1 %   Basophils Absolute 0.0 0.0 - 0.1 K/uL  Ethanol     Status: None   Collection Time: 02/18/14  1:38 PM  Result Value Ref Range   Alcohol, Ethyl (B) <5 0 - 9 mg/dL    Comment:        LOWEST DETECTABLE LIMIT FOR SERUM ALCOHOL IS 11 mg/dL FOR MEDICAL PURPOSES ONLY   Acetaminophen level     Status: Abnormal   Collection Time: 02/18/14  1:38 PM  Result Value Ref Range   Acetaminophen (Tylenol), Serum <10.0 (L) 10 - 30 ug/mL    Comment:        THERAPEUTIC CONCENTRATIONS VARY SIGNIFICANTLY. A RANGE OF 10-30 ug/mL MAY BE AN  EFFECTIVE CONCENTRATION FOR MANY PATIENTS. HOWEVER, SOME ARE BEST TREATED AT CONCENTRATIONS OUTSIDE THIS RANGE. ACETAMINOPHEN CONCENTRATIONS >150 ug/mL AT 4 HOURS AFTER INGESTION AND >50 ug/mL AT 12 HOURS AFTER INGESTION ARE OFTEN ASSOCIATED WITH TOXIC REACTIONS.   Salicylate level     Status: None   Collection Time: 02/18/14  1:38 PM  Result Value Ref Range   Salicylate Lvl <5.8 2.8 - 20.0 mg/dL  Glucose, capillary     Status: Abnormal   Collection Time: 02/18/14  3:58 PM  Result Value Ref Range   Glucose-Capillary 171 (H) 70 - 99 mg/dL  Basic metabolic panel     Status: Abnormal   Collection Time: 02/18/14  6:45 PM  Result Value Ref Range   Sodium 142 135 - 145 mmol/L    Comment: Please note change in reference range.   Potassium 2.9 (L) 3.5 - 5.1 mmol/L    Comment: Please note change in reference range.   Chloride 107 96 - 112 mEq/L   CO2 23 19 - 32 mmol/L   Glucose, Bld 171 (H) 70 - 99 mg/dL   BUN 14 6 - 23 mg/dL   Creatinine, Ser 1.39 (H) 0.50 - 1.35 mg/dL   Calcium 8.3 (L) 8.4 - 10.5 mg/dL   GFR calc non Af Amer 57 (L) >90 mL/min   GFR calc Af Amer 66 (L) >90 mL/min    Comment: (NOTE) The eGFR has been calculated using the CKD EPI equation. This calculation has not been validated in all clinical situations. eGFR's persistently <90 mL/min signify possible Chronic Kidney Disease.    Anion gap 12 5 - 15  Glucose, capillary     Status: Abnormal   Collection Time: 02/18/14  7:42 PM  Result Value Ref Range   Glucose-Capillary 173 (H) 70 - 99 mg/dL   Comment 1 Notify RN   Glucose, capillary     Status: Abnormal   Collection Time: 02/18/14 11:50 PM  Result Value Ref Range   Glucose-Capillary 143 (H) 70 - 99 mg/dL  Basic metabolic panel     Status: Abnormal   Collection Time: 02/19/14  2:52 AM  Result Value Ref Range   Sodium 140 135 - 145 mmol/L  Comment: Please note change in reference range.   Potassium 2.7 (LL) 3.5 - 5.1 mmol/L    Comment: Please note  change in reference range. REPEATED TO VERIFY CRITICAL RESULT CALLED TO, READ BACK BY AND VERIFIED WITH: SHEPHERD B,RN 02/19/14 0356 [id number]    Chloride 103 96 - 112 mEq/L   CO2 26 19 - 32 mmol/L   Glucose, Bld 167 (H) 70 - 99 mg/dL   BUN 12 6 - 23 mg/dL   Creatinine, Ser 1.13 0.50 - 1.35 mg/dL   Calcium 8.1 (L) 8.4 - 10.5 mg/dL   GFR calc non Af Amer 74 (L) >90 mL/min   GFR calc Af Amer 85 (L) >90 mL/min    Comment: (NOTE) The eGFR has been calculated using the CKD EPI equation. This calculation has not been validated in all clinical situations. eGFR's persistently <90 mL/min signify possible Chronic Kidney Disease.    Anion gap 11 5 - 15  CBC     Status: Abnormal   Collection Time: 02/19/14  2:52 AM  Result Value Ref Range   WBC 12.4 (H) 4.0 - 10.5 K/uL   RBC 4.28 4.22 - 5.81 MIL/uL   Hemoglobin 12.4 (L) 13.0 - 17.0 g/dL   HCT 35.4 (L) 39.0 - 52.0 %   MCV 82.7 78.0 - 100.0 fL   MCH 29.0 26.0 - 34.0 pg   MCHC 35.0 30.0 - 36.0 g/dL   RDW 13.9 11.5 - 15.5 %   Platelets 247 150 - 400 K/uL  Glucose, capillary     Status: Abnormal   Collection Time: 02/19/14  3:27 AM  Result Value Ref Range   Glucose-Capillary 140 (H) 70 - 99 mg/dL   Comment 1 Notify RN   Blood gas, arterial     Status: Abnormal   Collection Time: 02/19/14  5:00 AM  Result Value Ref Range   FIO2 0.40 %   Delivery systems VENTILATOR    Mode PRESSURE REGULATED VOLUME CONTROL    VT 500 mL   Rate 16 resp/min   Peep/cpap 5.0 cm H20   pH, Arterial 7.513 (H) 7.350 - 7.450   pCO2 arterial 33.6 (L) 35.0 - 45.0 mmHg   pO2, Arterial 148.0 (H) 80.0 - 100.0 mmHg   Bicarbonate 26.8 (H) 20.0 - 24.0 mEq/L   TCO2 27.8 0 - 100 mmol/L   Acid-Base Excess 3.8 (H) 0.0 - 2.0 mmol/L   O2 Saturation 99.1 %   Patient temperature 98.6    Collection site RIGHT RADIAL    Drawn by [phone]    Sample type ARTERIAL    Allens test (pass/fail) PASS PASS  Glucose, capillary     Status: Abnormal   Collection Time: 02/19/14  8:25 AM   Result Value Ref Range   Glucose-Capillary 151 (H) 70 - 99 mg/dL  Magnesium     Status: None   Collection Time: 02/19/14 11:48 AM  Result Value Ref Range   Magnesium 1.6 1.5 - 2.5 mg/dL  Basic metabolic panel     Status: Abnormal   Collection Time: 02/19/14 11:48 AM  Result Value Ref Range   Sodium 141 135 - 145 mmol/L    Comment: Please note change in reference range.   Potassium 3.1 (L) 3.5 - 5.1 mmol/L    Comment: Please note change in reference range.   Chloride 106 96 - 112 mEq/L   CO2 26 19 - 32 mmol/L   Glucose, Bld 139 (H) 70 - 99 mg/dL   BUN 11 6 - 23  mg/dL   Creatinine, Ser 1.00 0.50 - 1.35 mg/dL   Calcium 8.3 (L) 8.4 - 10.5 mg/dL   GFR calc non Af Amer 85 (L) >90 mL/min   GFR calc Af Amer >90 >90 mL/min    Comment: (NOTE) The eGFR has been calculated using the CKD EPI equation. This calculation has not been validated in all clinical situations. eGFR's persistently <90 mL/min signify possible Chronic Kidney Disease.    Anion gap 9 5 - 15  Glucose, capillary     Status: Abnormal   Collection Time: 02/19/14 11:51 AM  Result Value Ref Range   Glucose-Capillary 140 (H) 70 - 99 mg/dL  Glucose, capillary     Status: Abnormal   Collection Time: 02/19/14  3:48 PM  Result Value Ref Range   Glucose-Capillary 135 (H) 70 - 99 mg/dL  Glucose, capillary     Status: Abnormal   Collection Time: 02/19/14  7:48 PM  Result Value Ref Range   Glucose-Capillary 160 (H) 70 - 99 mg/dL  Glucose, capillary     Status: Abnormal   Collection Time: 02/19/14 11:53 PM  Result Value Ref Range   Glucose-Capillary 126 (H) 70 - 99 mg/dL  Basic metabolic panel     Status: Abnormal   Collection Time: 02/20/14  3:05 AM  Result Value Ref Range   Sodium 141 135 - 145 mmol/L    Comment: Please note change in reference range.   Potassium 3.4 (L) 3.5 - 5.1 mmol/L    Comment: Please note change in reference range.   Chloride 107 96 - 112 mEq/L   CO2 26 19 - 32 mmol/L   Glucose, Bld 127 (H) 70 -  99 mg/dL   BUN 12 6 - 23 mg/dL   Creatinine, Ser 0.88 0.50 - 1.35 mg/dL   Calcium 8.5 8.4 - 10.5 mg/dL   GFR calc non Af Amer >90 >90 mL/min   GFR calc Af Amer >90 >90 mL/min    Comment: (NOTE) The eGFR has been calculated using the CKD EPI equation. This calculation has not been validated in all clinical situations. eGFR's persistently <90 mL/min signify possible Chronic Kidney Disease.    Anion gap 8 5 - 15  Magnesium     Status: None   Collection Time: 02/20/14  3:05 AM  Result Value Ref Range   Magnesium 2.2 1.5 - 2.5 mg/dL  Glucose, capillary     Status: Abnormal   Collection Time: 02/20/14  4:01 AM  Result Value Ref Range   Glucose-Capillary 134 (H) 70 - 99 mg/dL   Comment 1 Documented in Chart    Comment 2 Notify RN   Glucose, capillary     Status: Abnormal   Collection Time: 02/20/14  7:36 AM  Result Value Ref Range   Glucose-Capillary 138 (H) 70 - 99 mg/dL  Glucose, capillary     Status: Abnormal   Collection Time: 02/20/14 12:27 PM  Result Value Ref Range   Glucose-Capillary 116 (H) 70 - 99 mg/dL  Glucose, capillary     Status: Abnormal   Collection Time: 02/20/14  3:47 PM  Result Value Ref Range   Glucose-Capillary 109 (H) 70 - 99 mg/dL  Glucose, capillary     Status: Abnormal   Collection Time: 02/20/14  7:33 PM  Result Value Ref Range   Glucose-Capillary 115 (H) 70 - 99 mg/dL  Glucose, capillary     Status: Abnormal   Collection Time: 02/20/14 11:38 PM  Result Value Ref Range   Glucose-Capillary  108 (H) 70 - 99 mg/dL   Comment 1 Notify RN   Glucose, capillary     Status: Abnormal   Collection Time: 02/21/14  3:34 AM  Result Value Ref Range   Glucose-Capillary 106 (H) 70 - 99 mg/dL  Basic metabolic panel     Status: Abnormal   Collection Time: 02/21/14  7:40 AM  Result Value Ref Range   Sodium 141 135 - 145 mmol/L    Comment: Please note change in reference range.   Potassium 3.7 3.5 - 5.1 mmol/L    Comment: Please note change in reference range.    Chloride 109 96 - 112 mEq/L   CO2 24 19 - 32 mmol/L   Glucose, Bld 108 (H) 70 - 99 mg/dL   BUN 17 6 - 23 mg/dL   Creatinine, Ser 0.97 0.50 - 1.35 mg/dL   Calcium 8.4 8.4 - 10.5 mg/dL   GFR calc non Af Amer >90 >90 mL/min   GFR calc Af Amer >90 >90 mL/min    Comment: (NOTE) The eGFR has been calculated using the CKD EPI equation. This calculation has not been validated in all clinical situations. eGFR's persistently <90 mL/min signify possible Chronic Kidney Disease.    Anion gap 8 5 - 15   Labs are reviewed and are pertinent for elevated WBC, positive for TCA and Benzo's.  Current Facility-Administered Medications  Medication Dose Route Frequency Provider Last Rate Last Dose  . 0.9 %  sodium chloride infusion  250 mL Intravenous PRN Cresenciano Genre, MD   Stopped at 02/21/14 [phone]  . albuterol (PROVENTIL) (2.5 MG/3ML) 0.083% nebulizer solution 2.5 mg  2.5 mg Nebulization Q2H PRN Cresenciano Genre, MD      . antiseptic oral rinse (CPC / CETYLPYRIDINIUM CHLORIDE 0.05%) solution 7 mL  7 mL Mouth Rinse BID Wilhelmina Mcardle, MD   7 mL at 02/20/14 2200  . cefTRIAXone (ROCEPHIN) 2 g in dextrose 5 % 50 mL IVPB - Premix  2 g Intravenous Q24H Wilhelmina Mcardle, MD   2 g at 02/20/14 1100  . [START ON 02/22/2014] citalopram (CELEXA) tablet 20 mg  20 mg Oral Daily Wilhelmina Mcardle, MD      . diazepam (VALIUM) tablet 1 mg  1 mg Oral QID Wilhelmina Mcardle, MD      . docusate (COLACE) 50 MG/5ML liquid 100 mg  100 mg Oral BID PRN Elsie Stain, MD      . enoxaparin (LOVENOX) injection 40 mg  40 mg Subcutaneous Q24H Wilhelmina Mcardle, MD      . ipratropium-albuterol (DUONEB) 0.5-2.5 (3) MG/3ML nebulizer solution 3 mL  3 mL Nebulization Q4H PRN Wilhelmina Mcardle, MD      . LORazepam (ATIVAN) injection 0.5-1 mg  0.5-1 mg Intravenous Q4H PRN Wilhelmina Mcardle, MD      . ondansetron [facility]) injection 4 mg  4 mg Intravenous Q6H PRN Cresenciano Genre, MD      . potassium chloride 10 mEq in 100 mL IVPB  10 mEq Intravenous Q1 Hr  x 4 Tasrif Ahmed, MD   10 mEq at 02/21/14 1572    Psychiatric Specialty Exam: Physical Exam as per history and physical  Review of Systems  Respiratory: Positive for shortness of breath.   Psychiatric/Behavioral: Positive for depression, suicidal ideas and substance abuse. The patient is nervous/anxious and has insomnia.     Blood pressure 104/83, pulse 118, temperature 98.3 F (36.8 C), temperature source Oral, resp. rate 24,  height 5' 4"  (1.626 m), weight 74.1 kg (163 lb 5.8 oz), SpO2 87 %.Body mass index is 28.03 kg/(m^2).  General Appearance: Bizarre and Disheveled  Eye Contact::  Minimal  Speech:  Slow and Slurred  Volume:  Decreased  Mood:  Anxious, Depressed, Hopeless and Irritable  Affect:  Restricted  Thought Process:  Disorganized  Orientation:  Negative  Thought Content:  Rumination  Suicidal Thoughts:  Yes.  with intent/plan  Homicidal Thoughts:  No  Memory:  Immediate;   Poor Recent;   Poor  Judgement:  Impaired  Insight:  Lacking  Psychomotor Activity:  Increased and Restlessness  Concentration:  Poor  Recall:  Poor  Fund of Knowledge:Poor  Language: Fair  Akathisia:  NA  Handed:  Right  AIMS (if indicated):     Assets:  Communication Skills Housing Intimacy Leisure Time Resilience Social Support  Sleep:      Musculoskeletal: Strength & Muscle Tone: increased Gait & Station: unable to stand Patient leans: N/A  Treatment Plan Summary: Daily contact with patient to assess and evaluate symptoms and progress in treatment Medication management  Discontinue Amitriptyline and Sinequan due to overdose and needs cardiac monitoring Continue citalopram 20 mg PO Qd for depression and start seroquel 25 mg PO TID for agitation   Analie Katzman,JANARDHAHA R. 02/21/2014 9:05 AM

## 2014-02-21 NOTE — Progress Notes (Signed)
PULMONARY / CRITICAL CARE MEDICINE   Name: Alvin Gonzalez MRN: [id number] DOB: 1960-08-14    ADMISSION DATE:  02/18/2014 CONSULTATION DATE:  02/18/14   REFERRING MD :  Selena Lesser  CHIEF COMPLAINT:  AMS, overdose   INITIAL PRESENTATION:  54 y.o PMH HTN, depression, tobacco abuse. Admitted to [facility] with overdose (? Substance ? Heroine, Xanax (GF had Xanax Rx 02/11/14 with 75 pills and bottle was empty) and/or Valium, Elavil, Seroquel, HCTZ, Norvasc, Prozac, cocaine, opiates, tramadol) ?intentional ?suicide attempt with previous h/o suicide attempt. GCS 3 at [place]. Given bicarb and Narcan 2 mg, NS 1L for sbp 90s. Intubated at outside facility   STUDIES:  CXR 1/10 and 1/11>Developing infiltrate and atelectasis right lower lobe CT head 1/11: normal.   SIGNIFICANT EVENTS: 1/10 admit, ETT  1/11 - remains sedated and unresponsive. 1/12 - extubated. Had increased secretion, sent resp cultures, started ceftriaxone.  Overnight: intermittently agitated overnight, gave 1x duoneb for increased o2 demand.  SUBJECTIVE:  Awake, follows commands, has incomprehensible speech.  VITAL SIGNS: Temp:  [98.3 F (36.8 C)-99.6 F (37.6 C)] 98.3 F (36.8 C) (01/13 0446) Pulse Rate:  [100-130] 111 (01/13 0600) Resp:  [15-35] 19 (01/13 0600) BP: (75-162)/(43-147) 113/70 mmHg (01/13 0600) SpO2:  [84 %-100 %] 90 % (01/13 0600) FiO2 (%):  [40 %-55 %] 55 % (01/13 0400) HEMODYNAMICS:   VENTILATOR SETTINGS: Vent Mode:  [-] PRVC FiO2 (%):  [40 %-55 %] 55 % Set Rate:  [16 bmp] 16 bmp Vt Set:  [500 mL] 500 mL PEEP:  [5 cmH20] 5 cmH20 Plateau Pressure:  [8 cmH20] 8 cmH20 INTAKE / OUTPUT:  Intake/Output Summary (Last 24 hours) at 02/21/14 0640 Last data filed at 02/21/14 0600  Gross per 24 hour  Intake 374.17 ml  Output   1140 ml  Net -765.83 ml   PHYSICAL EXAMINATION: General:  Lying in bed, awake, follows some commands, has incomprehensible speech Neuro:  Follows some commands.  HEENT:  Dilated  pupils but reactive Cardiovascular: reg rate, tachy Lungs:  Coarse b/l. Abdomen:  Soft, +bs Musculoskeletal:  -edema Skin:  Intact, warm  LABS:  CBC  Recent Labs Lab 02/18/14 1338 02/19/14 0252  WBC 12.5* 12.4*  HGB 12.6* 12.4*  HCT 36.3* 35.4*  PLT 234 247   BMET  Recent Labs Lab 02/19/14 0252 02/19/14 1148 02/20/14 0305  NA 140 141 141  K 2.7* 3.1* 3.4*  CL 103 106 107  CO2 26 26 26   BUN 12 11 12   CREATININE 1.13 1.00 0.88  GLUCOSE 167* 139* 127*   Electrolytes  Recent Labs Lab 02/18/14 1338  02/19/14 0252 02/19/14 1148 02/20/14 0305  CALCIUM 7.9*  < > 8.1* 8.3* 8.5  MG 1.5  --   --  1.6 2.2  PHOS 3.4  --   --   --   --   < > = values in this interval not displayed. Sepsis Markers  Recent Labs Lab 02/18/14 1230 02/18/14 1338  LATICACIDVEN  --  4.3*  PROCALCITON <0.10  --    ABG  Recent Labs Lab 02/18/14 1210 02/19/14 0500  PHART 7.338* 7.513*  PCO2ART 36.5 33.6*  PO2ART 274.0* 148.0*   Liver Enzymes  Recent Labs Lab 02/18/14 1338  AST 39*  ALT 20  ALKPHOS 98  BILITOT 0.3  ALBUMIN 3.0*   Cardiac Enzymes  Recent Labs Lab 02/18/14 1338  TROPONINI <0.03   Glucose  Recent Labs Lab 02/20/14 0736 02/20/14 1227 02/20/14 1547 02/20/14 1933 02/20/14 2338 02/21/14 [place]  138* 116* 109* 115* 108* 106*   Imaging No results found. ASSESSMENT / PLAN:  PULMONARY OETT 1/10>> 1/12 A: Increased secretion, CXR shows worsening atelectasis on right lobe.  P:   F/up sputum culture.  duoneb q4hr scheduled   CARDIOVASCULAR CVL none A:  History of tachycardic with QRS widening now NSR. QTc prior to admission was 475, tachycardia Prolonged QT--improving, down to 453  1st degree AV block  P:  Continue monitoring.  RENAL A:   AKI likely pre-renal - unknown BL Cr. At [place] 2.18--resolved. Cr down to 0.88. Hypomag, hypo K+   Recent Labs Lab 02/18/14 1338 02/18/14 1845 02/19/14 0252 02/19/14 1148  02/20/14 0305  CREATININE 1.76* 1.39* 1.13 1.00 0.88    P:   Replete K/mag PRN. Trend BMET  GASTROINTESTINAL A:   NPO P:  SLP when patient is more responsive, may need panda tube for feeds if unable.   HEMATOLOGIC A:   DVT px  No other issues  P:  Heparin sq,scds   INFECTIOUS A:   Leukocytosis labs [place] 14.8 could be reactive  Procalcitonin <0.1  P:   BCx2 not obtained  UC not obtained  Sent sputum culture because increased sputum 1/12 >>>  Ceftriaxone 1/12 >>>  Trend CBC  ENDOCRINE A:   Hyperglycemia - normal now. P:   Monitor cbgs  NEUROLOGIC/PSYCHIATRY A:   Encephalopathy likely due to polysubstance overdose outside labs +TCA. Suicide attempt Chronic back pain UDS +benzo, TCA screen positive, salicylate <4, Acetaminophen <10  P:   RASS goal: 0 to -1. AVOID ALL SEDATIVES. EEG negative for seizures. Sitter, suicide precautions, will need psychiatry when awake   FAMILY  - Updates: Father and sister updated by bedside 1/11  - Inter-disciplinary family meet or Palliative Care meeting due by:  02/25/14  Dellia Nims, M.D PGY-1 Internal Medicine Pager [phone]   PCCM ATTENDING: I have reviewed pt's initial presentation, consultants notes and hospital database in detail.  The above assessment and plan was formulated under my direction.  In summary: Poly substance OD Presumed suicide intent Extubated 1/12 RLL AS dz and purulent sputum - presumed PNA  Ceftriaxone day 2 Psych consult today Transfer to SDU - remains on PCCM pending psychiatry evaluation as he might be ready for [facility] as early as tomorrow    Merton Border, MD;  PCCM service; [place] [phone]

## 2014-02-22 ENCOUNTER — Inpatient Hospital Stay (HOSPITAL_COMMUNITY): Payer: Self-pay

## 2014-02-22 LAB — TRICYCLIC ANTIDEPRESSANT EVALUATION
AMITRIPTYLINE AND NORTRIPTYLINE: 1448 ug/L — AB (ref 100–250)
AMITRIPTYLINE: 1336 ug/L — AB
Clomipramine.: <5 mcg/L (ref 50–250)
Desipramine DPRAM: <5 mcg/L
Desmethyldoxepin: <5 mcg/L
Doxepin: <5 mcg/L
Grand Total Tricyclics: 1448 mcg/L — AB
NORTRIPTYLINE NTRIP: 112 ug/L
Total Desmethyldoxepine+Doxepin: <5 mcg/L — ABNORMAL LOW (ref 110–250)

## 2014-02-22 LAB — CBC
HCT: 33.3 % — ABNORMAL LOW (ref 39.0–52.0)
HEMOGLOBIN: 11.5 g/dL — AB (ref 13.0–17.0)
MCH: 28.8 pg (ref 26.0–34.0)
MCHC: 34.5 g/dL (ref 30.0–36.0)
MCV: 83.5 fL (ref 78.0–100.0)
Platelets: 220 10*3/uL (ref 150–400)
RBC: 3.99 MIL/uL — AB (ref 4.22–5.81)
RDW: 14 % (ref 11.5–15.5)
WBC: 10.4 10*3/uL (ref 4.0–10.5)

## 2014-02-22 LAB — BASIC METABOLIC PANEL
ANION GAP: 10 (ref 5–15)
BUN: 21 mg/dL (ref 6–23)
CO2: 21 mmol/L (ref 19–32)
Calcium: 8.9 mg/dL (ref 8.4–10.5)
Chloride: 113 mEq/L — ABNORMAL HIGH (ref 96–112)
Creatinine, Ser: 0.91 mg/dL (ref 0.50–1.35)
GFR calc Af Amer: >90 mL/min (ref >90)
GLUCOSE: 98 mg/dL (ref 70–99)
Potassium: 3.9 mmol/L (ref 3.5–5.1)
Sodium: 144 mmol/L (ref 135–145)

## 2014-02-22 LAB — CULTURE, RESPIRATORY W GRAM STAIN: Special Requests: NORMAL

## 2014-02-22 LAB — CULTURE, RESPIRATORY

## 2014-02-22 MED ORDER — LEVOFLOXACIN IN D5W 750 MG/150ML IV SOLN
750.0000 mg | INTRAVENOUS | Status: DC
  Administered 2014-02-22 – 2014-02-25 (×4): 750 mg via INTRAVENOUS
  Filled 2014-02-22 (×4): qty 150

## 2014-02-22 MED ORDER — SODIUM CHLORIDE 0.45 % IV SOLN: INTRAVENOUS | Status: DC

## 2014-02-22 MED ORDER — ENSURE COMPLETE PO LIQD
237.0000 mL | Freq: Three times a day (TID) | ORAL | Status: DC
  Administered 2014-02-22 – 2014-02-26 (×8): 237 mL via ORAL

## 2014-02-22 NOTE — Progress Notes (Signed)
PULMONARY / CRITICAL CARE MEDICINE   Name: Alvin Gonzalez MRN: [id number] DOB: 27-Sep-1960    ADMISSION DATE:  02/18/2014 CONSULTATION DATE:  02/18/14   REFERRING MD :  Selena Lesser  CHIEF COMPLAINT:  AMS, overdose   INITIAL PRESENTATION:  54 y.o PMH HTN, depression, tobacco abuse. Admitted to  with overdose (? Substance ? Heroine, Xanax (GF had Xanax Rx 02/11/14 with 75 pills and bottle was empty) and/or Valium, Elavil, Seroquel, HCTZ, Norvasc, Prozac, cocaine, opiates, tramadol) ?intentional ?suicide attempt with previous h/o suicide attempt. GCS 3 at [place]. Given bicarb and Narcan 2 mg, NS 1L for sbp 90s. Intubated at outside facility   STUDIES:  CXR 1/10 and 1/11>Developing infiltrate and atelectasis right lower lobe CT head 1/11: normal.  1/13: right lobe atelecatasis or developing PNA 1/4: simialr looking CXR, maybe improvement of right lower lobe opacity/infiltrate.  SIGNIFICANT EVENTS: 1/10 admit, ETT  1/11 - remains sedated and unresponsive. 1/12 - extubated. Had increased secretion, sent resp cultures, started ceftriaxone. 1/13- more awake, consulted psych.  Overnight: fever 101.8.   SUBJECTIVE:  Awake, follows commands, has incomprehensible speech.  VITAL SIGNS: Temp:  [97.4 F (36.3 C)-101.8 F (38.8 C)] 98 F (36.7 C) (01/14 0432) Pulse Rate:  [42-118] 106 (01/14 0600) Resp:  [17-35] 25 (01/14 0600) BP: (101-129)/(66-108) 125/108 mmHg (01/14 0600) SpO2:  [87 %-98 %] 97 % (01/14 0600) FiO2 (%):  [55 %] 55 % (01/13 1200) HEMODYNAMICS:   VENTILATOR SETTINGS: Vent Mode:  [-]  FiO2 (%):  [55 %] 55 % INTAKE / OUTPUT:  Intake/Output Summary (Last 24 hours) at 02/22/14 0717 Last data filed at 02/21/14 1245  Gross per 24 hour  Intake    460 ml  Output    110 ml  Net    350 ml   PHYSICAL EXAMINATION: General:  Lying in bed, awake, follows some commands, has incomprehensible speech Neuro:  Follows some commands.  HEENT:  Dilated pupils but  reactive Cardiovascular: reg rate, tachy Lungs:  Coarse b/l. Abdomen:  Soft, +bs Musculoskeletal:  -edema Skin:  Intact, warm  LABS:  CBC  Recent Labs Lab 02/18/14 1338 02/19/14 0252 02/22/14 0224  WBC 12.5* 12.4* 10.4  HGB 12.6* 12.4* 11.5*  HCT 36.3* 35.4* 33.3*  PLT 234 247 220   BMET  Recent Labs Lab 02/20/14 0305 02/21/14 0740 02/22/14 0224  NA 141 141 144  K 3.4* 3.7 3.9  CL 107 109 113*  CO2 26 24 21   BUN 12 17 21   CREATININE 0.88 0.97 0.91  GLUCOSE 127* 108* 98   Electrolytes  Recent Labs Lab 02/18/14 1338  02/19/14 1148 02/20/14 0305 02/21/14 0740 02/22/14 0224  CALCIUM 7.9*  < > 8.3* 8.5 8.4 8.9  MG 1.5  --  1.6 2.2  --   --   PHOS 3.4  --   --   --   --   --   < > = values in this interval not displayed. Sepsis Markers  Recent Labs Lab 02/18/14 1230 02/18/14 1338  LATICACIDVEN  --  4.3*  PROCALCITON <0.10  --    ABG  Recent Labs Lab 02/18/14 1210 02/19/14 0500  PHART 7.338* 7.513*  PCO2ART 36.5 33.6*  PO2ART 274.0* 148.0*   Liver Enzymes  Recent Labs Lab 02/18/14 1338  AST 39*  ALT 20  ALKPHOS 98  BILITOT 0.3  ALBUMIN 3.0*   Cardiac Enzymes  Recent Labs Lab 02/18/14 1338  TROPONINI <0.03   Glucose  Recent Labs Lab 02/20/14 1547 02/20/14 1933 02/20/14  2338 02/21/14 0334 02/21/14 0743 02/21/14 1134  GLUCAP 109* 115* 108* 106* 103* 111*   Imaging Dg Chest Port 1 View  02/21/2014   CLINICAL DATA:  Pneumonia .  EXAM: PORTABLE CHEST - 1 VIEW  COMPARISON:  02/19/2014.  FINDINGS: Mediastinum and hilar structures are normal. Mild cardiomegaly and pulmonary venous congestion. Bilateral pulmonary interstitial and alveolar infiltrates right side pleural effusion. These findings suggest congestive heart failure. Persistent atelectasis and infiltrate right lower lobe suggesting pneumonia. No pneumothorax. No acute osseous abnormality.  IMPRESSION: 1. Cardiomegaly with pulmonary venous congestion and new onset bilateral  pulmonary interstitial and alveolar infiltrates consistent with congestive heart failure now noted. Small right pleural effusion. 2. Persistent right lower lobe atelectasis and infiltrate suggesting pneumonia.   Electronically Signed   By: Marcello Moores  Register   On: 02/21/2014 07:32   ASSESSMENT / PLAN:  PULMONARY OETT 1/10>> 1/12 A: Increased secretion, CXR shows worsening atelectasis on right lobe.   P:   F/up sputum culture (few GN coccobacilli, rare GPR, GPCocci). duoneb q4hr scheduled On ceftriaxone currently, may need to broaden coverage for atypicals by switching to levaquin?  CARDIOVASCULAR CVL none A:  History of tachycardic with QRS widening now NSR. QTc prior to admission was 475, tachycardia Prolonged QT--improving, down to 453  1st degree AV block  P:  BP stable.  Continue monitoring.  RENAL A:   AKI likely pre-renal - unknown BL Cr. At [place] 2.18--resolved.  Hypomag, hypo K+  Borderline hypernatremia  Recent Labs Lab 02/19/14 0252 02/19/14 1148 02/20/14 0305 02/21/14 0740 02/22/14 0224  CREATININE 1.13 1.00 0.88 0.97 0.91    P:   Encourage PO intake. Replete K/mag PRN. Trend BMET  GASTROINTESTINAL A:  Tolerating diet now  P:  Advance diet to regular today Encourage increased PO intake.  HEMATOLOGIC A:   DVT px  No other issues  P:  Heparin sq,scds   INFECTIOUS A:   Leukocytosis labs [place] 14.8 could be reactive  - resolved 1/14. Procalcitonin <0.1  P:   BCx2 not obtained  UC not obtained  F/up sputum culture 1/12 >>>  Ceftriaxone 1/12 >>> 1/14 levaquin 1/14 >>  Switched ceftriaxone to levaquin for better atypical coverage.  ENDOCRINE A:   Hyperglycemia -resolved. P:   Monitor cbgs  NEUROLOGIC/PSYCHIATRY A:   Encephalopathy likely due to polysubstance overdose outside labs +TCA. Suicide attempt Chronic back pain UDS +benzo, TCA screen positive, salicylate <4, Acetaminophen <10, amitryptyline/nortriptyline 1448.    P:   RASS goal: 0 to -1. AVOID ALL SEDATIVES. EEG negative for seizures. Sitter, suicide precautions Psych following: d/ced amitryptyline and sinequan due to overdose.  Cont citalopram 20mg  po QD For depression, and start seroquel 25mg  PO TID for agigation  FAMILY  - Updates: Father and sister updated by bedside 1/14  - Inter-disciplinary family meet or Palliative Care meeting due by:  02/25/14   Summary: doing better. On 6L o2 currently. Switched to levaquin for better PNA coverage, on suicidal precaution. Psych following. Likely will go to [facility] soon. Transfer to med surg under PCCM.   Dellia Nims, M.D PGY-1 Internal Medicine Pager [phone]   PCCM ATTENDING: I have reviewed pt's initial presentation, consultants notes and hospital database in detail.  The above assessment and plan was formulated under my direction.   Merton Border, MD;  PCCM service; [place] [phone]

## 2014-02-22 NOTE — Progress Notes (Signed)
NUTRITION FOLLOW UP  Intervention:    Ensure Complete PO TID, each supplement provides 350 kcal and 13 grams of protein  Nutrition Dx:   Inadequate oral intake related to decreased alertness and agitation as evidenced by poor intake of meals.  Goal:   Intake to meet >90% of estimated nutrition needs. Unmet.  Monitor:   PO intake, labs, weight trend.  Assessment:   Patient was admitted to [facility] with overdose with previous history of suicide attempt. Transferred to [facility] on 1/10.  Patient was intubated from 1/10 to 1/12, received TF during that time. TF now off. Diet has been advanced to full liquids. Per discussion with patient's family member in room with patient, he ate poorly this AM due to agitation. Typically eats very well. He likes ice cream. Will order Ensure Complete supplements to maximize oral intake.  Height: Ht Readings from Last 1 Encounters:  02/22/14 5\' 6"  (1.676 m)    Weight Status:   Wt Readings from Last 1 Encounters:  02/20/14 163 lb 5.8 oz (74.1 kg)    Ideal weight 64.5 kg  Body mass index is 26.38 kg/(m^2).  Re-estimated needs:  Kcal: 1900-2100 Protein: 90-105 gm Fluid: 2 L  Skin: intact  Diet Order: Diet full liquid   Intake/Output Summary (Last 24 hours) at 02/22/14 1048 Last data filed at 02/21/14 1245  Gross per 24 hour  Intake    250 ml  Output      0 ml  Net    250 ml    Last BM: PTA   Labs:   Recent Labs Lab 02/18/14 1338  02/19/14 1148 02/20/14 0305 02/21/14 0740 02/22/14 0224  NA 142  < > 141 141 141 144  K 3.2*  < > 3.1* 3.4* 3.7 3.9  CL 112  < > 106 107 109 113*  CO2 20  < > 26 26 24 21   BUN 15  < > 11 12 17 21   CREATININE 1.76*  < > 1.00 0.88 0.97 0.91  CALCIUM 7.9*  < > 8.3* 8.5 8.4 8.9  MG 1.5  --  1.6 2.2  --   --   PHOS 3.4  --   --   --   --   --   GLUCOSE 186*  < > 139* 127* 108* 98  < > = values in this interval not displayed.  CBG (last 3)   Recent Labs  02/21/14 0334 02/21/14 0743 02/21/14 1134   GLUCAP 106* 103* 111*    Scheduled Meds: . antiseptic oral rinse  7 mL Mouth Rinse BID  . citalopram  20 mg Oral Daily  . diazepam  1 mg Oral QID  . enoxaparin (LOVENOX) injection  40 mg Subcutaneous Q24H  . levofloxacin (LEVAQUIN) IV  750 mg Intravenous Q24H  . QUEtiapine  25 mg Oral TID    Continuous Infusions:    Molli Barrows, RD, LDN, [place] Pager [phone] After Hours Pager [phone]

## 2014-02-23 LAB — BASIC METABOLIC PANEL
Anion gap: 10 (ref 5–15)
BUN: 19 mg/dL (ref 6–23)
CO2: 20 mmol/L (ref 19–32)
Calcium: 9.2 mg/dL (ref 8.4–10.5)
Chloride: 115 mEq/L — ABNORMAL HIGH (ref 96–112)
Creatinine, Ser: 0.92 mg/dL (ref 0.50–1.35)
GFR calc Af Amer: >90 mL/min (ref >90)
GFR calc non Af Amer: >90 mL/min (ref >90)
GLUCOSE: 94 mg/dL (ref 70–99)
Potassium: 3.9 mmol/L (ref 3.5–5.1)
Sodium: 145 mmol/L (ref 135–145)

## 2014-02-23 MED ORDER — LORAZEPAM 2 MG/ML IJ SOLN
1.0000 mg | Freq: Once | INTRAMUSCULAR | Status: AC
  Administered 2014-02-23: 1 mg via INTRAVENOUS
  Filled 2014-02-23: qty 1

## 2014-02-23 MED ORDER — LORAZEPAM 2 MG/ML IJ SOLN
1.0000 mg | INTRAMUSCULAR | Status: DC | PRN
  Administered 2014-02-23: 2 mg via INTRAVENOUS
  Filled 2014-02-23: qty 1

## 2014-02-23 MED ORDER — CHLORDIAZEPOXIDE HCL 25 MG PO CAPS
50.0000 mg | ORAL_CAPSULE | Freq: Three times a day (TID) | ORAL | Status: DC
  Filled 2014-02-23: qty 2

## 2014-02-23 MED ORDER — ACETAMINOPHEN 325 MG PO TABS
650.0000 mg | ORAL_TABLET | Freq: Four times a day (QID) | ORAL | Status: DC | PRN
  Administered 2014-02-23: 650 mg via ORAL
  Filled 2014-02-23 (×2): qty 2

## 2014-02-23 MED ORDER — CHLORDIAZEPOXIDE HCL 10 MG PO CAPS: 10.0000 mg | ORAL_CAPSULE | Freq: Three times a day (TID) | ORAL | Status: DC

## 2014-02-23 MED ORDER — CHLORDIAZEPOXIDE HCL 25 MG PO CAPS: 50.0000 mg | ORAL_CAPSULE | Freq: Three times a day (TID) | ORAL | Status: DC

## 2014-02-23 NOTE — Progress Notes (Signed)
Talked with NP earlier about the bed availability at SDU, NP stated that she willl check about the transfer order and will hold on to the patient but if patient gets worse to call. SDU called and stated a room is available., report was given but the receiving nurse is verifying the indication for SDU bed. MD on call paged and notified about what is going on, MD on call stated he will look into it. Will continue to monitor patient.

## 2014-02-23 NOTE — Progress Notes (Signed)
[place] Progress Note Patient Name: Alvin Gonzalez DOB: 1960-11-29 MRN: [id number]   Date of Service  02/23/2014  HPI/Events of Note  D/w Dr Lamonte Sakai Need for sdu - etoh, delirium, high risk worsening and WD  eICU Interventions       Intervention Category Major Interventions: Change in mental status - evaluation and management;Delirium, psychosis, severe agitation - evaluation and management  Raylene Miyamoto. 02/23/2014, 6:29 PM

## 2014-02-23 NOTE — Progress Notes (Addendum)
Patient very agitated, restless,confused placed a call to Md . Tylenol was ordered but patient spit medicine. Called Md again to update about patient behavior but stated that they will come and round on the patient. Patient continue to be very agitated, restless despite ativan given. Patient trying to hit staff and trying to get out of the bed, Bilateral wrist restraint applied, Md made aware. Will update wife with the restraint applied. Will continue to monitor patient .

## 2014-02-23 NOTE — Progress Notes (Signed)
PULMONARY / CRITICAL CARE MEDICINE   Name: Alvin Gonzalez MRN: [id number] DOB: 06/02/1960    ADMISSION DATE:  02/18/2014 CONSULTATION DATE:  02/18/14   REFERRING MD :  Selena Lesser  CHIEF COMPLAINT:  AMS, overdose   INITIAL PRESENTATION:  54 y.o PMH HTN, depression, tobacco abuse. Admitted to [facility] with overdose (? Substance ? Heroine, Xanax (GF had Xanax Rx 02/11/14 with 75 pills and bottle was empty) and/or Valium, Elavil, Seroquel, HCTZ, Norvasc, Prozac, cocaine, opiates, tramadol) ?intentional ?suicide attempt with previous h/o suicide attempt. GCS 3 at [place]. Given bicarb and Narcan 2 mg, NS 1L for sbp 90s. Intubated at outside facility.  Extubated on 1/12 and evaluated by PSY 1/13.     STUDIES:  CXR 1/10 and 1/11 >> Developing infiltrate and atelectasis right lower lobe 1/11  CT head 1/11 >> normal.  1/13  right lobe atelecatasis or developing PNA 1/14  simialr looking CXR, maybe improvement of right lower lobe opacity/infiltrate.  SIGNIFICANT EVENTS: 1/10   admit, ETT  1/11  remains sedated and unresponsive. 1/12  extubated. Had increased secretion, sent resp cultures, started ceftriaxone. 1/13  more awake, consulted psych.   SUBJECTIVE:  Periods of intermittent agitation, restlessness / confusion.  Abusive with staff.     VITAL SIGNS: Temp:  [98.8 F (37.1 C)-99.5 F (37.5 C)] 98.8 F (37.1 C) (01/15 0555) Pulse Rate:  [111-115] 111 (01/15 0555) Resp:  [30-32] 30 (01/15 0555) BP: (108-145)/(75-76) 145/75 mmHg (01/15 0555) SpO2:  [95 %-96 %] 96 % (01/15 0555)  INTAKE / OUTPUT:  Intake/Output Summary (Last 24 hours) at 02/23/14 1341 Last data filed at 02/23/14 0927  Gross per 24 hour  Intake    480 ml  Output   1400 ml  Net   -920 ml   PHYSICAL EXAMINATION: General:  Lying in bed, awake, follows some commands, has incomprehensible speech Neuro:  Follows some commands.  HEENT:  Dilated pupils but reactive Cardiovascular: reg rate, tachy Lungs:  Coarse  b/l. Abdomen:  Soft, +bs Musculoskeletal:  -edema Skin:  Intact, warm  LABS:  CBC  Recent Labs Lab 02/18/14 1338 02/19/14 0252 02/22/14 0224  WBC 12.5* 12.4* 10.4  HGB 12.6* 12.4* 11.5*  HCT 36.3* 35.4* 33.3*  PLT 234 247 220   BMET  Recent Labs Lab 02/21/14 0740 02/22/14 0224 02/23/14 0617  NA 141 144 145  K 3.7 3.9 3.9  CL 109 113* 115*  CO2 24 21 20   BUN 17 21 19   CREATININE 0.97 0.91 0.92  GLUCOSE 108* 98 94   Electrolytes  Recent Labs Lab 02/18/14 1338  02/19/14 1148 02/20/14 0305 02/21/14 0740 02/22/14 0224 02/23/14 0617  CALCIUM 7.9*  < > 8.3* 8.5 8.4 8.9 9.2  MG 1.5  --  1.6 2.2  --   --   --   PHOS 3.4  --   --   --   --   --   --   < > = values in this interval not displayed. Sepsis Markers  Recent Labs Lab 02/18/14 1230 02/18/14 1338  LATICACIDVEN  --  4.3*  PROCALCITON <0.10  --    ABG  Recent Labs Lab 02/18/14 1210 02/19/14 0500  PHART 7.338* 7.513*  PCO2ART 36.5 33.6*  PO2ART 274.0* 148.0*   Liver Enzymes  Recent Labs Lab 02/18/14 1338  AST 39*  ALT 20  ALKPHOS 98  BILITOT 0.3  ALBUMIN 3.0*   Cardiac Enzymes  Recent Labs Lab 02/18/14 1338  TROPONINI <0.03   Glucose  Recent  Labs Lab 02/20/14 1547 02/20/14 1933 02/20/14 2338 02/21/14 0334 02/21/14 0743 02/21/14 1134  GLUCAP 109* 115* 108* 106* 103* 111*   Imaging Dg Chest Port 1 View  02/22/2014   CLINICAL DATA:  Respiratory failure.  EXAM: PORTABLE CHEST - 1 VIEW  COMPARISON:  02/21/2014.  02/19/2014.  FINDINGS: Mediastinum and hilar structures are normal. Stable cardiomegaly and pulmonary venous congestion. Persistent bilateral pulmonary infiltrates with increase in alveolar infiltrates in both lungs. Small right pleural effusion. No pneumothorax.  IMPRESSION: 1. Findings consistent with persistent congestive heart failure and pulmonary edema. 2. Bilateral pulmonary infiltrates have increased from prior exam. Superimposed pneumonia cannot be excluded.  Small right pleural effusion.   Electronically Signed   By: Marcello Moores  Register   On: 02/22/2014 07:31   ASSESSMENT / PLAN:   NEUROLOGIC/PSYCHIATRY A:   Encephalopathy likely due to polysubstance overdose outside labs +TCA.  EEG negative for seizures Suicide attempt Chronic back pain UDS +benzo, TCA screen positive, salicylate <4, Acetaminophen <10, amitryptyline/nortriptyline 1448.  P:   Add scheduled librium 10 mg PO TID  Adjust ativan 1-2mg  Q4 PRN  Sitter, suicide precautions Psych following: d/ced amitryptyline and sinequan due to overdose.  Cont citalopram 20mg  po QD For depression, and start seroquel 25mg  PO TID for agitation   PULMONARY OETT 1/10>> 1/12 A: RLL Atelectasis - concern for CAP, + H.Flu in sputum P:   Follow sputum cultures  Duoneb q4hr scheduled See ID Pulmonary hygiene  Trend CXR   CARDIOVASCULAR A:  Tachycardia - with QRS widening on admit, now NSR. QTc prior to admission was 475 Prolonged QT- improving, down to 453  1st degree AV block P:  BP stable.  Continue monitoring.  RENAL A:   AKI likely pre-renal - unknown BL Cr. At [place] 2.18--resolved.  Hypomag, hypo K+  Borderline hypernatremia P:   Encourage PO intake. Replete K/mag PRN. Trend BMET  GASTROINTESTINAL A:  No acute issues  P:  Regular diet as tolerated  Encourage increased PO intake.  HEMATOLOGIC A:   DVT px  No other issues  P:  DVT > heparin SQ, SCD's  INFECTIOUS A:   H. Flu Positive Sputum + RLL atx - may be colonizer  P:   BCx2 not obtained  UC not obtained  Sputum culture 1/12 >>> H. Flu, beta lactam negative   Ceftriaxone 1/12 >>> 1/14 levaquin 1/14 >>  Switched ceftriaxone to levaquin for better atypical coverage. D4 abx, consider 7 days total   ENDOCRINE A:   Hyperglycemia - resolved. P:   Monitor cbgs     FAMILY  - Updates: Girlfriend updated at bedside.      To SDU for close observation / nsg care.  Tx to [facility] as of 1/16 0700  Noe Gens, NP-C [facility] Pgr: [phone] or [phone]    Attending Note:  I have examined patient, reviewed labs, studies and notes. I have discussed the case with B Ollis, and I agree with the data and plans as amended above.   Baltazar Apo, MD, PhD 02/23/2014, 2:33 PM [facility] [phone] or if no answer [phone]

## 2014-02-23 NOTE — Progress Notes (Signed)
Called by RN regarding transfer status.  No beds in SDU.  Family is refusing medications on behalf of the patient.  Discussed status with RN and she feels he is safe for the floor at this time.  Also discussed if he were to decompensate to call for assistance.  She indicates understanding.  Will hold on floor for now.    Alvin Gens, NP-C [place] [facility] [facility] Pgr: [phone] or [phone]  Baltazar Apo, MD, PhD 02/26/2014, 10:17 AM [facility] [phone] or if no answer [phone]

## 2014-02-23 NOTE — Progress Notes (Signed)
Transferred to 2C18.

## 2014-02-23 NOTE — Progress Notes (Addendum)
Patient continue to be restless, agitated,confused. Prn ativan given.

## 2014-02-24 ENCOUNTER — Inpatient Hospital Stay (HOSPITAL_COMMUNITY): Payer: MEDICAID

## 2014-02-24 LAB — BASIC METABOLIC PANEL
Anion gap: 11 (ref 5–15)
BUN: 20 mg/dL (ref 6–23)
CALCIUM: 9.2 mg/dL (ref 8.4–10.5)
CO2: 22 mmol/L (ref 19–32)
CREATININE: 0.83 mg/dL (ref 0.50–1.35)
Chloride: 111 mEq/L (ref 96–112)
Glucose, Bld: 102 mg/dL — ABNORMAL HIGH (ref 70–99)
Potassium: 4 mmol/L (ref 3.5–5.1)
Sodium: 144 mmol/L (ref 135–145)

## 2014-02-24 LAB — CBC
HEMATOCRIT: 39.1 % (ref 39.0–52.0)
Hemoglobin: 13.4 g/dL (ref 13.0–17.0)
MCH: 28.9 pg (ref 26.0–34.0)
MCHC: 34.3 g/dL (ref 30.0–36.0)
MCV: 84.3 fL (ref 78.0–100.0)
Platelets: 328 10*3/uL (ref 150–400)
RBC: 4.64 MIL/uL (ref 4.22–5.81)
RDW: 13.5 % (ref 11.5–15.5)
WBC: 10.2 10*3/uL (ref 4.0–10.5)

## 2014-02-24 MED ORDER — NICOTINE 21 MG/24HR TD PT24
21.0000 mg | MEDICATED_PATCH | Freq: Every day | TRANSDERMAL | Status: DC
  Administered 2014-02-24 – 2014-02-26 (×3): 21 mg via TRANSDERMAL
  Filled 2014-02-24 (×3): qty 1

## 2014-02-24 MED ORDER — MENTHOL 3 MG MT LOZG: 1.0000 | LOZENGE | OROMUCOSAL | Status: DC | PRN

## 2014-02-24 MED ORDER — MENTHOL 3 MG MT LOZG
1.0000 | LOZENGE | OROMUCOSAL | Status: DC | PRN
  Administered 2014-02-24 – 2014-02-25 (×5): 3 mg via ORAL
  Filled 2014-02-24: qty 9

## 2014-02-24 MED ORDER — PANTOPRAZOLE SODIUM 40 MG PO TBEC
40.0000 mg | DELAYED_RELEASE_TABLET | Freq: Every day | ORAL | Status: DC
  Administered 2014-02-24 – 2014-02-26 (×3): 40 mg via ORAL
  Filled 2014-02-24 (×3): qty 1

## 2014-02-24 NOTE — Progress Notes (Signed)
Patient awake and calm this morning. Slept all throughout the night. Restraints removed. Patient reoriented. Girlfriend and sitter at bedside. VSS. Environment checked for safety. No concerns at this time. Will continue to monitor.  M.Forest Gleason, RN

## 2014-02-24 NOTE — Evaluation (Signed)
Physical Therapy Evaluation Patient Details Name: Alvin Gonzalez MRN: [id number] DOB: 1960-03-03 Today's Date: 02/24/2014   History of Present Illness  54 y.o PMH HTN, depression, tobacco abuse. Admitted to [facility] with overdose (? Substance ? Heroine, Xanax (GF had Xanax Rx 02/11/14 with 75 pills and bottle was empty) and/or Valium, Elavil, Seroquel, HCTZ, Norvasc, Prozac, cocaine, opiates, tramadol) ?intentional ?suicide attempt with previous h/o suicide attempt. Ett 1/10-1/12  Clinical Impression  Pt lethargic on arrival with increased stimulation to maintain arousal. Girlfriend present and providing home setup and PLOF. 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 Per girlfriends meds made him have insomnia so he took other meds to sleep and she doesn't think his overdose was intentional. Girlfriend able to provide 24hr assist at home. Pt very pleasant and willing to mobilize and progress activity. Pt with below deficits who will benefit from acute therapy to maximize mobility, function, gait and safety to decrease burden of care and fall risk.     Follow Up Recommendations CIR;Supervision/Assistance - 24 hour    Equipment Recommendations  None recommended by PT    Recommendations for Other Services OT consult     Precautions / Restrictions Precautions Precautions: Fall      Mobility  Bed Mobility Overal bed mobility: Needs Assistance Bed Mobility: Supine to Sit     Supine to sit: Min assist     General bed mobility comments: cues for sequence and safety with assist to initiate and complete legs OOB and elevating trunk  Transfers Overall transfer level: Needs assistance   Transfers: Sit to/from Stand Sit to Stand: Min assist         General transfer comment: cues for hand placement and safety  Ambulation/Gait Ambulation/Gait assistance: Mod assist Ambulation Distance (Feet): 100 Feet Assistive device:  1 person hand held assist Gait Pattern/deviations: Decreased stride length;Shuffle;Scissoring;Drifts right/left   Gait velocity interpretation: Below normal speed for age/gender General Gait Details: Pt with ataxic gait with decreased balance and need for assist at pelvis to control balance with gait and prevent fall. Pt veering throughout with cues for increased BOS  Stairs            Wheelchair Mobility    Modified Rankin (Stroke Patients Only)       Balance Overall balance assessment: Needs assistance   Sitting balance-Leahy Scale: Fair       Standing balance-Leahy Scale: Poor                               Pertinent Vitals/Pain Pain Assessment: No/denies pain  HR 103 sats 96% on RA with gait    Home Living Family/patient expects to be discharged to:: Private residence Living Arrangements: Spouse/significant other Available Help at Discharge: Friend(s);Available 24 hours/day Type of Home: House Home Access: Stairs to enter   [employer] of Steps: 1 or 8 Home Layout: One level Home Equipment: Walker - 2 wheels;Bedside commode;Shower seat;Cane - single point      Prior Function Level of Independence: Independent               Hand Dominance        Extremity/Trunk Assessment   Upper Extremity Assessment: Overall WFL for tasks assessed           Lower Extremity Assessment: Overall WFL for tasks assessed      Cervical / Trunk Assessment: Normal  Communication   Communication: No difficulties  Cognition Arousal/Alertness: Awake/alert Behavior During Therapy: Flat affect Overall Cognitive Status: Impaired/Different from baseline Area of Impairment: Orientation;Memory;Attention;Following commands;Safety/judgement Orientation Level: Disoriented to;Time Current Attention Level: Selective Memory: Decreased short-term memory Following Commands: Follows one step commands consistently Safety/Judgement: Decreased awareness  of safety;Decreased awareness of deficits          General Comments      Exercises        Assessment/Plan    PT Assessment Patient needs continued PT services  PT Diagnosis Abnormality of gait;Altered mental status   PT Problem List Decreased cognition;Decreased activity tolerance;Decreased balance;Decreased mobility;Decreased safety awareness;Decreased knowledge of use of DME  PT Treatment Interventions Gait training;Stair training;Functional mobility training;Therapeutic activities;Therapeutic exercise;Patient/family education;Cognitive remediation;Neuromuscular re-education;Balance training;DME instruction   PT Goals (Current goals can be found in the Care Plan section) Acute Rehab PT Goals Patient Stated Goal: climb trees PT Goal Formulation: With patient Time For Goal Achievement: 03/10/14 Potential to Achieve Goals: Good    Frequency Min 3X/week   Barriers to discharge        Co-evaluation               End of Session Equipment Utilized During Treatment: Gait belt Activity Tolerance: Patient tolerated treatment well Patient left: in chair;with call bell/phone within reach;with nursing/sitter in room;with family/visitor present Nurse Communication: Mobility status;Precautions         Time: 8546-2703 PT Time Calculation (min) (ACUTE ONLY): 15 min   Charges:   PT Evaluation $Initial PT Evaluation Tier I: 1 Procedure PT Treatments $Gait Training: 8-22 mins   PT G CodesMelford Aase 02/24/2014, 2:10 PM  Elwyn Reach, [place]

## 2014-02-24 NOTE — Progress Notes (Signed)
Patient Demographics  Alvin Gonzalez, is a 54 y.o. male, DOB - [birth year], OMB:[id number]  Admit date - 02/18/2014   Admitting Physician Collene Gobble, MD  Outpatient Primary MD for the patient is PROVIDER NOT IN SYSTEM  LOS - 6   No chief complaint on file.     Summary   54 y.o PMH HTN, depression, tobacco abuse. Admitted to [facility] with overdose (? Substance ? Heroine, Xanax (GF had Xanax Rx 02/11/14 with 75 pills and bottle was empty) and/or Valium, Elavil, Seroquel, HCTZ, Norvasc, Prozac, cocaine, opiates, tramadol) ?intentional ?suicide attempt with previous h/o suicide attempt. GCS 3 at [place]. Given bicarb and Narcan 2 mg, NS 1L for sbp 90s. Intubated at outside facility. Extubated on 1/12 and evaluated by PSY 1/13.He was transferred to hospitalist service on 02/24/2014.   Subjective:   Alvin Gonzalez today has, No headache, No chest pain, No abdominal pain - No Nausea, No new weakness tingling or numbness, No Cough - SOB.    Assessment & Plan    1. Overdose. Question suicide attempt versus unintentional. Unclear what exactly she took. Urine drug screen was positive for benzodiazepine. Amitriptyline levels were high. He initially required endotracheal intubation and care under pulmonary critical care in ICU, he has since improved, psych was consulted on 02/21/2014 and medications were adjusted. He was transferred to hospitalist service on 02/24/2014. He is currently minimally confused but much improved. Patient and family are refusing most of the psych medications.  Currently on Celexa, Seroquel along with benzodiazepines. Which patient and family are refusing on a consistent basis. Her and she not suicidal or homicidal.    2. Acute respiratory failure. 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 Requiring intubation. Currently resolved on room air.    3. CAP. Cultures positive for Haemophilus influenza. On Levaquin continue. Supportive care. Nontoxic appearance.    4. Long QTC on admission. Secondary to psych medications and overdose, resolved QTC down to 453.    5. Dehydration with acute renal failure and hypernatremia. Resolved with IV fluids.      Code Status: Full  Family Communication: girl friend   Disposition Plan: TBD, grease activity involve PT and social work.   Procedures  CT head.  STUDIES:  CXR 1/10 and 1/11 >> Developing infiltrate and atelectasis right lower lobe 1/11 CT head 1/11 >> normal.  1/13 right lobe atelecatasis or developing PNA 1/14 simialr looking CXR, maybe improvement of right lower lobe opacity/infiltrate.  SIGNIFICANT EVENTS: 1/10 admit, ETT  1/11 remains sedated and unresponsive. 1/12 extubated. Had increased secretion, sent resp cultures, started ceftriaxone. 1/13 more awake, consulted psych. 1/16 transferred to triad  Consults  PCCM, Psych   Medications  Scheduled Meds: . antiseptic oral rinse  7 mL Mouth Rinse BID  . chlordiazePOXIDE  50 mg Oral TID  . citalopram  20 mg Oral Daily  . enoxaparin (LOVENOX) injection  40 mg Subcutaneous Q24H  . feeding supplement (ENSURE COMPLETE)  237 mL Oral TID BM  . levofloxacin (LEVAQUIN) IV  750 mg Intravenous Q24H  . QUEtiapine  25 mg Oral TID   Continuous Infusions:  PRN Meds:.sodium chloride, acetaminophen, albuterol, ipratropium-albuterol, LORazepam, ondansetron (ZOFRAN) IV  DVT Prophylaxis  Lovenox    Lab Results  Component  Value Date   PLT 328 02/24/2014    Antibiotics     Anti-infectives    Start     Dose/Rate Route Frequency Ordered Stop   02/22/14 1000  levofloxacin (LEVAQUIN) IVPB 750 mg     750 mg100 mL/hr over 90 Minutes Intravenous Every 24 hours 02/22/14 0823     02/20/14 1100  cefTRIAXone (ROCEPHIN) 2 g in dextrose 5 % 50 mL IVPB -  Premix  Status:  Discontinued     2 g100 mL/hr over 30 Minutes Intravenous Every 24 hours 02/20/14 1049 02/22/14 0823          Objective:   Filed Vitals:   02/23/14 2313 02/24/14 0351 02/24/14 0547 02/24/14 0731  BP: 145/85 136/83  143/97  Pulse: 98 98 87 100  Temp: 97.1 F (36.2 C) 98.2 F (36.8 C)  97.9 F (36.6 C)  TempSrc: Axillary Axillary  Oral  Resp: 31 31 25 26   Height:      Weight:      SpO2: 97% 95% 94% 97%    Wt Readings from Last 3 Encounters:  02/23/14 66.815 kg (147 lb 4.8 oz)  10/29/13 68.04 kg (150 lb)     Intake/Output Summary (Last 24 hours) at 02/24/14 1141 Last data filed at 02/24/14 0547  Gross per 24 hour  Intake    600 ml  Output    825 ml  Net   -225 ml     Physical Exam  Awake , Oriented X 2, No new F.N deficits, Normal affect Why.AT,PERRAL Supple Neck,No JVD, No cervical lymphadenopathy appriciated.  Symmetrical Chest wall movement, Good air movement bilaterally, CTAB RRR,No Gallops,Rubs or new Murmurs, No Parasternal Heave +ve B.Sounds, Abd Soft, No tenderness, No organomegaly appriciated, No rebound - guarding or rigidity. No Cyanosis, Clubbing or edema, No new Rash or bruise      Data Review   Micro Results Recent Results (from the past 240 hour(s))  MRSA PCR Screening     Status: None   Collection Time: 02/18/14 11:33 AM  Result Value Ref Range Status   MRSA by PCR NEGATIVE NEGATIVE Final    Comment:        The GeneXpert MRSA Assay (FDA approved for NASAL specimens only), is one component of a comprehensive MRSA colonization surveillance program. It is not intended to diagnose MRSA infection nor to guide or monitor treatment for MRSA infections.   Culture, respiratory (NON-Expectorated)     Status: None   Collection Time: 02/20/14 10:39 AM  Result Value Ref Range Status   Specimen Description TRACHEAL ASPIRATE  Final   Special Requests Normal  Final   Gram Stain   Final    ABUNDANT WBC PRESENT, PREDOMINANTLY  PMN NO SQUAMOUS EPITHELIAL CELLS SEEN FEW GRAM NEGATIVE COCCOBACILLI RARE GRAM POSITIVE RODS RARE GRAM POSITIVE COCCI    Culture   Final    MODERATE HAEMOPHILUS INFLUENZAE Note: BETA LACTAMASE NEGATIVE Performed at [facility]    Report Status 02/22/2014 FINAL  Final    Radiology Reports Ct Head Wo Contrast  02/19/2014   CLINICAL DATA:  Recent overdose, unresponsive.  EXAM: CT HEAD WITHOUT CONTRAST  TECHNIQUE: Contiguous axial images were obtained from the base of the skull through the vertex without intravenous contrast.  COMPARISON:  10/27/2013.  FINDINGS: No evidence of an acute infarct, acute hemorrhage, mass lesion, mass effect or hydrocephalus. No air-fluid levels in the visualized portions of the paranasal sinuses are mastoid air cells.  IMPRESSION: Normal brain.   Electronically  Signed   By: Lorin Picket M.D.   On: 02/19/2014 13:00   Dg Chest Port 1 View  02/24/2014   CLINICAL DATA:  Shortness of breath.  EXAM: PORTABLE CHEST - 1 VIEW  COMPARISON:  02/22/2014  FINDINGS: Near complete resolution of bilateral pulmonary airspace disease is seen since prior exam. There is persistent mild atelectasis or infiltrate in the right lung base. No evidence of pneumothorax or significant pleural effusion. Heart size is normal.  IMPRESSION: Near complete resolution of bilateral pulmonary airspace disease. Persistent mild right basilar atelectasis versus infiltrate.   Electronically Signed   By: Earle Gell M.D.   On: 02/24/2014 09:16       CBC  Recent Labs Lab 02/18/14 1338 02/19/14 0252 02/22/14 0224 02/24/14 0305  WBC 12.5* 12.4* 10.4 10.2  HGB 12.6* 12.4* 11.5* 13.4  HCT 36.3* 35.4* 33.3* 39.1  PLT 234 247 220 328  MCV 81.4 82.7 83.5 84.3  MCH 28.3 29.0 28.8 28.9  MCHC 34.7 35.0 34.5 34.3  RDW 13.8 13.9 14.0 13.5  LYMPHSABS 0.8  --   --   --   MONOABS 0.7  --   --   --   EOSABS 0.0  --   --   --   BASOSABS 0.0  --   --   --     Chemistries   Recent Labs Lab  02/18/14 1338  02/19/14 1148 02/20/14 0305 02/21/14 0740 02/22/14 0224 02/23/14 0617 02/24/14 0305  NA 142  < > 141 141 141 144 145 144  K 3.2*  < > 3.1* 3.4* 3.7 3.9 3.9 4.0  CL 112  < > 106 107 109 113* 115* 111  CO2 20  < > 26 26 24 21 20 22   GLUCOSE 186*  < > 139* 127* 108* 98 94 102*  BUN 15  < > 11 12 17 21 19 20   CREATININE 1.76*  < > 1.00 0.88 0.97 0.91 0.92 0.83  CALCIUM 7.9*  < > 8.3* 8.5 8.4 8.9 9.2 9.2  MG 1.5  --  1.6 2.2  --   --   --   --   AST 39*  --   --   --   --   --   --   --   ALT 20  --   --   --   --   --   --   --   ALKPHOS 98  --   --   --   --   --   --   --   BILITOT 0.3  --   --   --   --   --   --   --   < > = values in this interval not displayed. ------------------------------------------------------------------------------------------------------------------ estimated creatinine clearance is 95 mL/min (by C-G formula based on Cr of 0.83). ------------------------------------------------------------------------------------------------------------------ No results for input(s): HGBA1C in the last 72 hours. ------------------------------------------------------------------------------------------------------------------ No results for input(s): CHOL, HDL, LDLCALC, TRIG, CHOLHDL, LDLDIRECT in the last 72 hours. ------------------------------------------------------------------------------------------------------------------ No results for input(s): TSH, T4TOTAL, T3FREE, THYROIDAB in the last 72 hours.  Invalid input(s): FREET3 ------------------------------------------------------------------------------------------------------------------ No results for input(s): VITAMINB12, FOLATE, FERRITIN, TIBC, IRON, RETICCTPCT in the last 72 hours.  Coagulation profile No results for input(s): INR, PROTIME in the last 168 hours.  No results for input(s): DDIMER in the last 72 hours.  Cardiac Enzymes  Recent Labs Lab 02/18/14 1338  TROPONINI <0.03    ------------------------------------------------------------------------------------------------------------------ Invalid input(s): POCBNP     Time  Spent in minutes   35   Lala Lund K M.D on 02/24/2014 at 11:41 AM  Between 7am to 7pm - Pager - [phone]  After 7pm go to [email or web address] - [facility] Office  [phone]

## 2014-02-25 MED ORDER — METHOCARBAMOL 500 MG PO TABS
500.0000 mg | ORAL_TABLET | Freq: Three times a day (TID) | ORAL | Status: DC | PRN
  Administered 2014-02-25 – 2014-02-26 (×2): 500 mg via ORAL
  Filled 2014-02-25 (×4): qty 1

## 2014-02-25 MED ORDER — LEVOFLOXACIN 750 MG PO TABS
750.0000 mg | ORAL_TABLET | Freq: Every day | ORAL | Status: DC
  Administered 2014-02-26: 750 mg via ORAL
  Filled 2014-02-25: qty 1

## 2014-02-25 NOTE — Progress Notes (Signed)
Patient Demographics  Alvin Gonzalez, is a 54 y.o. male, DOB - Jan 05, 1961, ZYY:[id number]  Admit date - 02/18/2014   Admitting Physician Collene Gobble, MD  Outpatient Primary MD for the patient is PROVIDER NOT IN SYSTEM  LOS - 7   No chief complaint on file.     Summary   54 y.o PMH HTN, depression, tobacco abuse. Admitted to [facility] with overdose (? Substance ? Heroine, Xanax (GF had Xanax Rx 02/11/14 with 75 pills and bottle was empty) and/or Valium, Elavil, Seroquel, HCTZ, Norvasc, Prozac, cocaine, opiates, tramadol) ?intentional ?suicide attempt with previous h/o suicide attempt. GCS 3 at [place]. Given bicarb and Narcan 2 mg, NS 1L for sbp 90s. Intubated at outside facility. Extubated on 1/12 and evaluated by PSY 1/13.He was transferred to hospitalist service on 02/24/2014.   Subjective:   Alvin Gonzalez today has, No headache, No chest pain, No abdominal pain - No Nausea, No new weakness tingling or numbness, No Cough - SOB.    Assessment & Plan    1. Overdose. Question suicide attempt versus unintentional. Unclear what exactly she took. Urine drug screen was positive for benzodiazepine. Amitriptyline levels were high. He initially required endotracheal intubation and care under pulmonary critical care in ICU, he has since improved, psych was consulted on 02/21/2014 and medications were adjusted. He was transferred to hospitalist service on 02/24/2014. He is currently minimally confused but much improved. Patient and family are refusing most of the psych medications.  Currently on Celexa, Seroquel along with benzodiazepines. Which patient and family are refusing on a consistent basis. At present he is not suicidal or homicidal.    2. Acute respiratory failure. Due to chemical encephalopathy from  drug overdose. Requiring intubation. Currently resolved on room air.    3. CAP. Cultures positive for Haemophilus influenza. On Levaquin continue. Supportive care. Nontoxic appearance.    4. Long QTC on admission. Secondary to psych medications and overdose, resolved QTC down to 453.    5. Dehydration with acute renal failure and hypernatremia. Resolved with IV fluids.      Code Status: Full  Family Communication: girl friend   Disposition Plan: TBD, but psych inpatient hospice. PT requested CIR. We'll discuss with psychiatrist again on Monday.   Procedures    STUDIES:  CXR 1/10 and 1/11 >> Developing infiltrate and atelectasis right lower lobe 1/11 CT head 1/11 >> normal.  1/13 right lobe atelecatasis or developing PNA 1/14 simialr looking CXR, maybe improvement of right lower lobe opacity/infiltrate.  SIGNIFICANT EVENTS: 1/10 admit, ETT  1/11 remains sedated and unresponsive. 1/12 extubated. Had increased secretion, sent resp cultures, started ceftriaxone. 1/13 more awake, consulted psych. 1/16 transferred to triad  Consults  PCCM, Psych   Medications  Scheduled Meds: . antiseptic oral rinse  7 mL Mouth Rinse BID  . chlordiazePOXIDE  50 mg Oral TID  . citalopram  20 mg Oral Daily  . enoxaparin (LOVENOX) injection  40 mg Subcutaneous Q24H  . feeding supplement (ENSURE COMPLETE)  237 mL Oral TID BM  . levofloxacin (LEVAQUIN) IV  750 mg Intravenous Q24H  . nicotine  21 mg Transdermal Daily  . pantoprazole  40 mg Oral Daily  . QUEtiapine  25 mg Oral TID   Continuous Infusions:  PRN Meds:.sodium chloride, acetaminophen, albuterol, ipratropium-albuterol, LORazepam, menthol-cetylpyridinium, ondansetron (ZOFRAN) IV  DVT Prophylaxis  Lovenox    Lab Results  Component Value Date   PLT 328 02/24/2014    Antibiotics     Anti-infectives    Start     Dose/Rate Route Frequency Ordered Stop   02/22/14 1000  levofloxacin (LEVAQUIN) IVPB 750 mg      750 mg100 mL/hr over 90 Minutes Intravenous Every 24 hours 02/22/14 0823     02/20/14 1100  cefTRIAXone (ROCEPHIN) 2 g in dextrose 5 % 50 mL IVPB - Premix  Status:  Discontinued     2 g100 mL/hr over 30 Minutes Intravenous Every 24 hours 02/20/14 1049 02/22/14 0823          Objective:   Filed Vitals:   02/24/14 2338 02/25/14 0200 02/25/14 0332 02/25/14 0732  BP: 124/80 129/83 114/75 125/85  Pulse: 91 93 95 96  Temp: 98.9 F (37.2 C)  97.7 F (36.5 C) 98.2 F (36.8 C)  TempSrc: Oral  Oral Oral  Resp: 13 28 31 23   Height:      Weight:   69.31 kg (152 lb 12.8 oz)   SpO2: 95% 94% 95% 95%    Wt Readings from Last 3 Encounters:  02/25/14 69.31 kg (152 lb 12.8 oz)  10/29/13 68.04 kg (150 lb)     Intake/Output Summary (Last 24 hours) at 02/25/14 1029 Last data filed at 02/25/14 0956  Gross per 24 hour  Intake   1980 ml  Output   1001 ml  Net    979 ml     Physical Exam  Awake , Oriented X 2, No new F.N deficits, Normal affect Alvin Gonzalez,Alvin Gonzalez Supple Neck,No JVD, No cervical lymphadenopathy appriciated.  Symmetrical Chest wall movement, Good air movement bilaterally, CTAB RRR,No Gallops,Rubs or new Murmurs, No Parasternal Heave +ve B.Sounds, Abd Soft, No tenderness, No organomegaly appriciated, No rebound - guarding or rigidity. No Cyanosis, Clubbing or edema, No new Rash or bruise      Data Review   Micro Results Recent Results (from the past 240 hour(s))  MRSA PCR Screening     Status: None   Collection Time: 02/18/14 11:33 AM  Result Value Ref Range Status   MRSA by PCR NEGATIVE NEGATIVE Final    Comment:        The GeneXpert MRSA Assay (FDA approved for NASAL specimens only), is one component of a comprehensive MRSA colonization surveillance program. It is not intended to diagnose MRSA infection nor to guide or monitor treatment for MRSA infections.   Culture, respiratory (NON-Expectorated)     Status: None   Collection Time: 02/20/14 10:39 AM  Result  Value Ref Range Status   Specimen Description TRACHEAL ASPIRATE  Final   Special Requests Normal  Final   Gram Stain   Final    ABUNDANT WBC PRESENT, PREDOMINANTLY PMN NO SQUAMOUS EPITHELIAL CELLS SEEN FEW GRAM NEGATIVE COCCOBACILLI RARE GRAM POSITIVE RODS RARE GRAM POSITIVE COCCI    Culture   Final    MODERATE HAEMOPHILUS INFLUENZAE Note: BETA LACTAMASE NEGATIVE Performed at [facility]    Report Status 02/22/2014 FINAL  Final    Radiology Reports Ct Head Wo Contrast  02/19/2014   CLINICAL DATA:  Recent overdose, unresponsive.  EXAM: CT HEAD WITHOUT CONTRAST  TECHNIQUE: Contiguous axial images were obtained from the base of the skull through the vertex without intravenous contrast.  COMPARISON:  10/27/2013.  FINDINGS: No evidence of an acute infarct, acute hemorrhage, mass  lesion, mass effect or hydrocephalus. No air-fluid levels in the visualized portions of the paranasal sinuses are mastoid air cells.  IMPRESSION: Normal brain.   Electronically Signed   By: Lorin Picket M.D.   On: 02/19/2014 13:00   Dg Chest Port 1 View  02/24/2014   CLINICAL DATA:  Shortness of breath.  EXAM: PORTABLE CHEST - 1 VIEW  COMPARISON:  02/22/2014  FINDINGS: Near complete resolution of bilateral pulmonary airspace disease is seen since prior exam. There is persistent mild atelectasis or infiltrate in the right lung base. No evidence of pneumothorax or significant pleural effusion. Heart size is normal.  IMPRESSION: Near complete resolution of bilateral pulmonary airspace disease. Persistent mild right basilar atelectasis versus infiltrate.   Electronically Signed   By: Earle Gell M.D.   On: 02/24/2014 09:16       CBC  Recent Labs Lab 02/18/14 1338 02/19/14 0252 02/22/14 0224 02/24/14 0305  WBC 12.5* 12.4* 10.4 10.2  HGB 12.6* 12.4* 11.5* 13.4  HCT 36.3* 35.4* 33.3* 39.1  PLT 234 247 220 328  MCV 81.4 82.7 83.5 84.3  MCH 28.3 29.0 28.8 28.9  MCHC 34.7 35.0 34.5 34.3  RDW 13.8 13.9  14.0 13.5  LYMPHSABS 0.8  --   --   --   MONOABS 0.7  --   --   --   EOSABS 0.0  --   --   --   BASOSABS 0.0  --   --   --     Chemistries   Recent Labs Lab 02/18/14 1338  02/19/14 1148 02/20/14 0305 02/21/14 0740 02/22/14 0224 02/23/14 0617 02/24/14 0305  NA 142  < > 141 141 141 144 145 144  K 3.2*  < > 3.1* 3.4* 3.7 3.9 3.9 4.0  CL 112  < > 106 107 109 113* 115* 111  CO2 20  < > 26 26 24 21 20 22   GLUCOSE 186*  < > 139* 127* 108* 98 94 102*  BUN 15  < > 11 12 17 21 19 20   CREATININE 1.76*  < > 1.00 0.88 0.97 0.91 0.92 0.83  CALCIUM 7.9*  < > 8.3* 8.5 8.4 8.9 9.2 9.2  MG 1.5  --  1.6 2.2  --   --   --   --   AST 39*  --   --   --   --   --   --   --   ALT 20  --   --   --   --   --   --   --   ALKPHOS 98  --   --   --   --   --   --   --   BILITOT 0.3  --   --   --   --   --   --   --   < > = values in this interval not displayed. ------------------------------------------------------------------------------------------------------------------ estimated creatinine clearance is 95 mL/min (by C-G formula based on Cr of 0.83). ------------------------------------------------------------------------------------------------------------------ No results for input(s): HGBA1C in the last 72 hours. ------------------------------------------------------------------------------------------------------------------ No results for input(s): CHOL, HDL, LDLCALC, TRIG, CHOLHDL, LDLDIRECT in the last 72 hours. ------------------------------------------------------------------------------------------------------------------ No results for input(s): TSH, T4TOTAL, T3FREE, THYROIDAB in the last 72 hours.  Invalid input(s): FREET3 ------------------------------------------------------------------------------------------------------------------ No results for input(s): VITAMINB12, FOLATE, FERRITIN, TIBC, IRON, RETICCTPCT in the last 72 hours.  Coagulation profile No results for input(s): INR,  PROTIME in the last 168 hours.  No results for input(s): DDIMER in  the last 72 hours.  Cardiac Enzymes  Recent Labs Lab 02/18/14 1338  TROPONINI <0.03   ------------------------------------------------------------------------------------------------------------------ Invalid input(s): POCBNP     Time Spent in minutes   35   Dameion Briles K M.D on 02/25/2014 at 10:29 AM  Between 7am to 7pm - Pager - [phone]  After 7pm go to [email or web address] - [facility] Office  [phone]

## 2014-02-26 DIAGNOSIS — I9589 Other hypotension: Secondary | ICD-10-CM

## 2014-02-26 DIAGNOSIS — E876 Hypokalemia: Secondary | ICD-10-CM

## 2014-02-26 DIAGNOSIS — T50904S Poisoning by unspecified drugs, medicaments and biological substances, undetermined, sequela: Secondary | ICD-10-CM

## 2014-02-26 DIAGNOSIS — N179 Acute kidney failure, unspecified: Secondary | ICD-10-CM

## 2014-02-26 NOTE — Progress Notes (Signed)
02/26/2014 Rn went over discharge sheet with patient and girlfriend at 1445 and 1500. Patient was told about follow up with primary care physician and his psychiatrist, also medication that need to be taken. Donia Pounds CSW was called about setting outpatient psy follow up appointment. Patient and girlfriend was told by Rn to follow up [facility] and the number is written on the discharge paper. [facility] RN.

## 2014-02-26 NOTE — Clinical Social Work Psych Note (Signed)
Patient is to follow-up outpatient with [facility].  Information was placed on discharge summary for patient's convenience.  Patient is agreeable to follow-up and compliance.  Nonnie Done, [place] [phone]  Psychiatric & Orthopedics (5N 1-16) Clinical Social Worker

## 2014-02-26 NOTE — Clinical Social Work Psych Note (Signed)
Psych CSW received notification from unit CSW of patient's medical readiness.  Psych CSW reviewed chart.  Patient was last evaluated by psychiatry on 02/21/2014 at which time, recommendation was: inpatient psychiatric admission.  Psych CSW requested re-evaluation for placement appropriateness.  Psych CSW following for assistance with disposition/placement.  Nonnie Done, [place] [phone]  Psychiatric & Orthopedics (5N 1-16) Clinical Social Worker

## 2014-02-26 NOTE — Consult Note (Signed)
Physical Medicine and Rehabilitation Consult Reason for Consult: Acute encephalopathy secondary to drug overdose Referring Physician: [facility]   HPI: Alvin Gonzalez is a 54 y.o. right handed male with history of depression and previous history of suicide attempt. Patient independent prior to admission living with his girlfriend. Admitted to [facility] with question drug overdose versus attempted suicide. GCS 3 at [place]. Patient given bicarbonate and Narcan. He was intubated. Cranial CT scan 02/19/2014 negative. Acute renal failure 2.18 improved with gentle IV fluids to 0.88. Urine drug screen positive for benzodiazepines, TCA screen positive, salicylate less than 4. EEG negative. Patient was extubated 02/20/2014 per critical care medicine. Remains agitated restless confused and bouts of combativeness. Subcutaneous Lovenox added for DVT prophylaxis. Physical therapy evaluation completed 02/24/2014 with recommendations of physical medicine rehabilitation consult.   Review of Systems  Musculoskeletal: Positive for back pain.  Psychiatric/Behavioral: Positive for depression.  All other systems reviewed and are negative.  Past Medical History  Diagnosis Date  . Hypertension   . Tobacco abuse   . Depression   . Suicide attempt     prior to 02/18/14 admission   . Chronic back pain    Past Surgical History  Procedure Laterality Date  . Lymph node biopsy     No family history on file. Social History:  reports that he has been smoking Cigarettes.  He has been smoking about 0.50 packs per day. He does not have any smokeless tobacco history on file. He reports that he drinks alcohol. He reports that he does not use illicit drugs. Allergies:  Allergies  Allergen Reactions  . Ibuprofen Nausea And Vomiting  . Tegretol [Carbamazepine] Rash   Medications Prior to Admission  Medication Sig Dispense Refill  . amitriptyline (ELAVIL) 25 MG tablet Take 70 mg by mouth at bedtime.     [name] amLODipine (NORVASC) 10 MG tablet Take 10 mg by mouth daily.    . citalopram (CELEXA) 20 MG tablet Take 20 mg by mouth daily.    . diazepam (VALIUM) 2 MG tablet Take 1 mg by mouth 4 (four) times daily.    . diclofenac sodium (VOLTAREN) 1 % GEL Apply 2 g topically 4 (four) times daily as needed (for pain).    . hydrochlorothiazide (HYDRODIURIL) 50 MG tablet Take 50 mg by mouth daily.    . naproxen sodium (ALEVE) 220 MG tablet Take 440 mg by mouth 2 (two) times daily as needed (pain).    . QUEtiapine (SEROQUEL) 25 MG tablet Take 25 mg by mouth at bedtime.    [name] oxyCODONE-acetaminophen (PERCOCET/ROXICET) 5-325 MG per tablet Take 1 tablet by mouth every 8 (eight) hours as needed for moderate pain or severe pain. 11 tablet 0  . oxyCODONE-acetaminophen (PERCOCET/ROXICET) 5-325 MG per tablet Take 1 tablet by mouth every 6 (six) hours as needed for moderate pain or severe pain. 12 tablet 0    Home: Home Living Family/patient expects to be discharged to:: Private residence Living Arrangements: Spouse/significant other Available Help at Discharge: Friend(s), Available 24 hours/day Type of Home: House Home Access: Stairs to enter [employer] of Steps: 1 or 8 Home Layout: One level Home Equipment: Environmental consultant - 2 wheels, Bedside commode, Shower seat, Cane - single point  Functional History: Prior Function Level of Independence: Independent Functional Status:  Mobility: Bed Mobility Overal bed mobility: Needs Assistance Bed Mobility: Supine to Sit Supine to sit: Min assist General bed mobility comments: cues for sequence and safety with assist to initiate and  complete legs OOB and elevating trunk Transfers Overall transfer level: Needs assistance Transfers: Sit to/from Stand Sit to Stand: Min assist General transfer comment: cues for hand placement and safety Ambulation/Gait Ambulation/Gait assistance: Mod assist Ambulation Distance (Feet): 100 Feet Assistive device: 1 person hand  held assist Gait Pattern/deviations: Decreased stride length, Shuffle, Scissoring, Drifts right/left Gait velocity interpretation: Below normal speed for age/gender General Gait Details: Pt with ataxic gait with decreased balance and need for assist at pelvis to control balance with gait and prevent fall. Pt veering throughout with cues for increased BOS    ADL:    Cognition: Cognition Overall Cognitive Status: Impaired/Different from baseline Orientation Level: Oriented X4 Cognition Arousal/Alertness: Awake/alert Behavior During Therapy: Flat affect Overall Cognitive Status: Impaired/Different from baseline Area of Impairment: Orientation, Memory, Attention, Following commands, Safety/judgement Orientation Level: Disoriented to, Time Current Attention Level: Selective Memory: Decreased short-term memory Following Commands: Follows one step commands consistently Safety/Judgement: Decreased awareness of safety, Decreased awareness of deficits  Blood pressure 116/76, pulse 83, temperature 98.3 F (36.8 C), temperature source Oral, resp. rate 15, height 5\' 6"  (1.676 m), weight 69.31 kg (152 lb 12.8 oz), SpO2 96 %. Physical Exam  Vitals reviewed. Constitutional: He appears well-developed.  HENT:  Head: Normocephalic.  Eyes: EOM are normal.  Neck: Normal range of motion. Neck supple. No thyromegaly present.  Cardiovascular: Normal rate and regular rhythm.   Respiratory: Effort normal and breath sounds normal. No respiratory distress.  GI: Soft. Bowel sounds are normal. He exhibits no distension.  Neurological: He is alert.  Patient made good eye contact with examiner and was appropriate for conversation. He was able to provide his name, age date of birth. He followed full commands.  Skin: Skin is warm and dry.    No results found for this or any previous visit (from the past 24 hour(s)). Dg Chest Port 1 View  02/24/2014   CLINICAL DATA:  Shortness of breath.  EXAM: PORTABLE  CHEST - 1 VIEW  COMPARISON:  02/22/2014  FINDINGS: Near complete resolution of bilateral pulmonary airspace disease is seen since prior exam. There is persistent mild atelectasis or infiltrate in the right lung base. No evidence of pneumothorax or significant pleural effusion. Heart size is normal.  IMPRESSION: Near complete resolution of bilateral pulmonary airspace disease. Persistent mild right basilar atelectasis versus infiltrate.   Electronically Signed   By: Earle Gell M.D.   On: 02/24/2014 09:16    Assessment/Plan: Diagnosis: ?anoxic injury (prior TBI's with chronic behavioral disorder?) 1. Does the need for close, 24 hr/day medical supervision in concert with the patient's rehab needs make it unreasonable for this patient to be served in a less intensive setting? No 2. Co-Morbidities requiring supervision/potential complications: hypotension 3. Due to bladder management, bowel management and safety, does the patient require 24 hr/day rehab nursing? No 4. Does the patient require coordinated care of a physician, rehab nurse, PT, OT, SLP to address physical and functional deficits in the context of the above medical diagnosis(es)? No Addressing deficits in the following areas: balance, endurance, locomotion, strength, transferring, bowel/bladder control, bathing, feeding, grooming and toileting 5. Can the patient actively participate in an intensive therapy program of at least 3 hrs of therapy per day at least 5 days per week? Potentially 6. The potential for patient to make measurable gains while on inpatient rehab is fair 7. Anticipated functional outcomes upon discharge from inpatient rehab are n/a  with PT, n/a with OT, n/a with SLP. 8. Estimated rehab length of  stay to reach the above functional goals is: n/a 9. Does the patient have adequate social supports and living environment to accommodate these discharge functional goals? Yes 10. Anticipated D/C setting: Home 11. Anticipated post  D/C treatments: [place] therapy 12. Overall Rehab/Functional Prognosis: good and fair  RECOMMENDATIONS: This patient's condition is appropriate for continued rehabilitative care in the following setting: [facility] Therapy Patient has agreed to participate in recommended program. Yes Note that insurance prior authorization may be required for reimbursement for recommended care.  Comment: Spoke with the patient and his wife. He is moving well, very strong. Biggest issues center around psych/behavior. Was having agitation prior to this admission. Has one major TBI in 1998 and likely another TBI after fall from tree last year----suspect behavior issues center around these injuries. Recommend hh therapy follow up and psych/BI follow up as well.   Meredith Staggers, MD, [place] [facility] 02/26/2014     02/26/2014

## 2014-02-26 NOTE — Consult Note (Signed)
Psychiatry Consult Follow Up  Reason for Consult:  Suicide attempt with TCA and benzo's Referring Physician:  Lala Lund, MD  Ollivander See is an 54 y.o. male. Total Time spent with patient: 20 minutes  Assessment: AXIS I:  Major Depression, Recurrent severe AXIS II:  Deferred AXIS III:   Past Medical History  Diagnosis Date  . Hypertension   . Tobacco abuse   . Depression   . Suicide attempt     prior to 02/18/14 admission   . Chronic back pain    AXIS IV:  other psychosocial or environmental problems, problems related to social environment and problems with primary support group AXIS V:  41-50 serious symptoms  Plan: Recommended outpatient psychiatric services when medically cleared Continues current medication management Seroquel 25 mg 3 times a day and Celexa 20 mg daily Discontinued amitriptyline and doxepin secondary to overdose No evidence of imminent risk to self or others at present.   Patient does not meet criteria for psychiatric inpatient admission.  Appreciate psychiatric consultation and will sign off at this time Please contact [phone] or [phone] if needs further assistance   Subjective:   Alvin Gonzalez is a 54 y.o. male patient admitted with suicide attempt  HPI:  Patient seen, chart reviewed and case discussed with his wife who was at bed side along with safety sitter. Patient is awake but not able to provide history due to respiratory failure and status post extubation recently. Patient appeared restless in his bed and trying to get out of bed and needed redirection and calm him down few times. Patient wife stated that he was referred to out patient psych by his parole officer, and was evaluated initially at [facility] and later at [facility] center in [place] who provided medication like Elavil and Sinequan for depression and insomnia. Patient had history of crack cocaine, heroin and his last use was six months ago. He was incarcerated for domestic  violence. Reportedly fell from tree while working about few months ago and visited ER and no fractures as per records. He has no children. He has no previous suicide attempts. His wife found him with insomnia and later in the morning with AMS and believed he was overdosed his Elavil and may be sinequan (unknown amount). Patient UDS is positive for TCA and benzo's.   Interval history: Patient seen for reevaluation of psychiatric consultation for status post medication overdose.. Patient was placed in a stepdown from the intensive care unit about 72 hours ago. Patient wife is at bedside and contributed to this evaluation. Patient  does not remember taking overdose on his medication and reportedly blocked out for few days secondary to altered mental status. Patient denies having suicidal intention or plans. Patient has no previous history of suicidal attempts. Patient wife reported his medication bottles were empty and found him with altered mental status before admission to the hospital. Patient has been with improved cognitions, able to sit in a chair next to his bed and able to participate during this evaluation without difficulties. Patient denies current symptoms of depression, anxiety, mania, psychosis, and craving for drugs [sober over 3 years). Patient denies current suicidal, homicidal ideation, intentions or plans. Reportedly patient has been noncompliant with his psychiatric medication. Patient is willing to follow up with outpatient psychiatric services and his wife is willing to follow up with him. Patient contract for safety and agreed to follow-up with outpatient psychiatric services at this time. Patient does not meet criteria for acute psychiatric hospitalization  at this time.  Medical history: Patient with back pain, depression, tobacco abuse. Admitted to [facility] with overdose (? Substance ? Heroine, Xanax (GF had Xanax Rx 02/11/14 with 75 pills and bottle was empty) and/or Valium, Elavil,  sinequan, HCTZ, Norvasc, Prozac, cocaine, opiates, tramadol) ?intentional ?suicide attempt with previous h/o suicide attempt. GCS 3 at [facility]. Given bicarb and Narcan 2 mg, NS 1L for sbp 90s. Intubated at outside facility    Past Psychiatric History: Past Medical History  Diagnosis Date  . Hypertension   . Tobacco abuse   . Depression   . Suicide attempt     prior to 02/18/14 admission   . Chronic back pain     reports that he has been smoking Cigarettes.  He has been smoking about 0.50 packs per day. He does not have any smokeless tobacco history on file. He reports that he drinks alcohol. He reports that he does not use illicit drugs. No family history on file.   Living Arrangements: Spouse/significant other     Allergies:   Allergies  Allergen Reactions  . Ibuprofen Nausea And Vomiting  . Tegretol [Carbamazepine] Rash    ACT Assessment Complete:  NO Objective: Blood pressure 116/76, pulse 83, temperature 98.3 F (36.8 C), temperature source Oral, resp. rate 15, height 5' 6"  (1.676 m), weight 69.31 kg (152 lb 12.8 oz), SpO2 96 %.Body mass index is 24.67 kg/(m^2). Results for orders placed or performed during the hospital encounter of 02/18/14 (from the past 72 hour(s))  CBC     Status: None   Collection Time: 02/24/14  3:05 AM  Result Value Ref Range   WBC 10.2 4.0 - 10.5 K/uL   RBC 4.64 4.22 - 5.81 MIL/uL   Hemoglobin 13.4 13.0 - 17.0 g/dL   HCT 39.1 39.0 - 52.0 %   MCV 84.3 78.0 - 100.0 fL   MCH 28.9 26.0 - 34.0 pg   MCHC 34.3 30.0 - 36.0 g/dL   RDW 13.5 11.5 - 15.5 %   Platelets 328 150 - 400 K/uL  Basic metabolic panel     Status: Abnormal   Collection Time: 02/24/14  3:05 AM  Result Value Ref Range   Sodium 144 135 - 145 mmol/L    Comment: Please note change in reference range.   Potassium 4.0 3.5 - 5.1 mmol/L    Comment: Please note change in reference range.   Chloride 111 96 - 112 mEq/L   CO2 22 19 - 32 mmol/L   Glucose, Bld 102 (H) 70 - 99 mg/dL   BUN  20 6 - 23 mg/dL   Creatinine, Ser 0.83 0.50 - 1.35 mg/dL   Calcium 9.2 8.4 - 10.5 mg/dL   GFR calc non Af Amer >90 >90 mL/min   GFR calc Af Amer >90 >90 mL/min    Comment: (NOTE) The eGFR has been calculated using the CKD EPI equation. This calculation has not been validated in all clinical situations. eGFR's persistently <90 mL/min signify possible Chronic Kidney Disease.    Anion gap 11 5 - 15   Labs are reviewed and are pertinent for elevated WBC, positive for TCA and Benzo's.  Current Facility-Administered Medications  Medication Dose Route Frequency Provider Last Rate Last Dose  . 0.9 %  sodium chloride infusion  250 mL Intravenous PRN Cresenciano Genre, MD   Stopped at 02/21/14 [phone]  . acetaminophen (TYLENOL) tablet 650 mg  650 mg Oral Q6H PRN Grace Bushy Minor, NP   650 mg at 02/23/14 1059  .  albuterol (PROVENTIL) (2.5 MG/3ML) 0.083% nebulizer solution 2.5 mg  2.5 mg Nebulization Q2H PRN Cresenciano Genre, MD      . antiseptic oral rinse (CPC / CETYLPYRIDINIUM CHLORIDE 0.05%) solution 7 mL  7 mL Mouth Rinse BID Wilhelmina Mcardle, MD   7 mL at 02/25/14 2200  . chlordiazePOXIDE (LIBRIUM) capsule 50 mg  50 mg Oral TID Donita Brooks, NP   50 mg at 02/23/14 1551  . citalopram (CELEXA) tablet 20 mg  20 mg Oral Daily Wilhelmina Mcardle, MD   20 mg at 02/23/14 0920  . enoxaparin (LOVENOX) injection 40 mg  40 mg Subcutaneous Q24H Wilhelmina Mcardle, MD   40 mg at 02/26/14 0914  . feeding supplement (ENSURE COMPLETE) (ENSURE COMPLETE) liquid 237 mL  237 mL Oral TID BM Dalene Carrow, RD   237 mL at 02/26/14 0915  . ipratropium-albuterol (DUONEB) 0.5-2.5 (3) MG/3ML nebulizer solution 3 mL  3 mL Nebulization Q4H PRN Wilhelmina Mcardle, MD      . levofloxacin [facility]) tablet 750 mg  750 mg Oral Daily Thurnell Lose, MD   750 mg at 02/26/14 0915  . LORazepam (ATIVAN) injection 1-2 mg  1-2 mg Intravenous Q4H PRN Donita Brooks, NP   2 mg at 02/23/14 1648  . menthol-cetylpyridinium (CEPACOL) lozenge 3  mg  1 lozenge Oral PRN Thurnell Lose, MD   3 mg at 02/25/14 2209  . methocarbamol (ROBAXIN) tablet 500 mg  500 mg Oral Q8H PRN Thurnell Lose, MD   500 mg at 02/26/14 0915  . nicotine (NICODERM CQ - dosed in mg/24 hours) patch 21 mg  21 mg Transdermal Daily Thurnell Lose, MD   21 mg at 02/26/14 0913  . ondansetron (ZOFRAN) injection 4 mg  4 mg Intravenous Q6H PRN Cresenciano Genre, MD      . pantoprazole (PROTONIX) EC tablet 40 mg  40 mg Oral Daily Thurnell Lose, MD   40 mg at 02/26/14 0925  . QUEtiapine (SEROQUEL) tablet 25 mg  25 mg Oral TID Durward Parcel, MD   25 mg at 02/22/14 1600    Psychiatric Specialty Exam: Physical Exam as per history and physical  Review of Systems  Respiratory: Positive for shortness of breath.   Psychiatric/Behavioral: Positive for depression, suicidal ideas and substance abuse. The patient is nervous/anxious and has insomnia.     Blood pressure 116/76, pulse 83, temperature 98.3 F (36.8 C), temperature source Oral, resp. rate 15, height 5' 6"  (1.676 m), weight 69.31 kg (152 lb 12.8 oz), SpO2 96 %.Body mass index is 24.67 kg/(m^2).  General Appearance: Casual  Eye Contact::  Good  Speech:  Clear and Coherent  Volume:  Normal  Mood:  Depressed  Affect:  Appropriate, Congruent and Restricted  Thought Process:  Coherent and Goal Directed  Orientation:  Full (Time, Place, and Person)  Thought Content:  WDL  Suicidal Thoughts:  No  Homicidal Thoughts:  No  Memory:  Immediate;   Good Recent;   Good  Judgement:  Good  Insight:  Good  Psychomotor Activity:  Normal  Concentration:  Good  Recall:  Good  Fund of Knowledge:Good  Language: Good  Akathisia:  NA  Handed:  Right  AIMS (if indicated):     Assets:  Communication Skills Desire for Improvement Housing Intimacy Leisure Time Resilience Social Support  Sleep:      Musculoskeletal: Strength & Muscle Tone: within normal limits Gait & Station: normal Patient leans:  N/A  Treatment Plan Summary: Daily contact with patient to assess and evaluate symptoms and progress in treatment Medication management  Discontinue Amitriptyline and Sinequan due to overdose and needs cardiac monitoring Continue citalopram 20 mg PO Qd for depression and seroquel 25 mg PO TID for agitation   Duriel Deery,JANARDHAHA R. 02/26/2014 11:05 AM

## 2014-02-26 NOTE — Discharge Summary (Signed)
Alvin Gonzalez, is a 54 y.o. male  DOB 14-Dec-1960  MRN [id number].  Admission date:  02/18/2014  Admitting Physician  Collene Gobble, MD  Discharge Date:  02/26/2014   Primary MD  PROVIDER NOT IN SYSTEM  Recommendations for primary care physician for things to follow:   Monitor clinically. Needs close outpatient psychiatry follow-up. Repeat CBC in 2 view chest x-ray in a week.    Admission Diagnosis  POLYSUBSTANCE OVERDOSE   Discharge Diagnosis  POLYSUBSTANCE OVERDOSE  unintentional currently not suicidal  Principal Problem:   Overdose Active Problems:   Hypokalemia   Suicide attempt   Respiratory failure   Hypotension   Tachycardia   AKI (acute kidney injury)   Leukocytosis   Acute encephalopathy      Past Medical History  Diagnosis Date  . Hypertension   . Tobacco abuse   . Depression   . Suicide attempt     prior to 02/18/14 admission   . Chronic back pain     Past Surgical History  Procedure Laterality Date  . Lymph node biopsy         History of present illness and  Hospital Course:     Kindly see H&P for history of present illness and admission details, please review complete Labs, Consult reports and Test reports for all details in brief  HPI  54 y.o PMH HTN, depression, tobacco abuse. Admitted to  with overdose (? Substance ? Heroine, Xanax (GF had Xanax Rx 02/11/14 with 75 pills and bottle was empty) and/or Valium, Elavil, Seroquel, HCTZ, Norvasc, Prozac, cocaine, opiates, tramadol) ?intentional ?suicide attempt with previous h/o suicide attempt. GCS 3 at [place]. Given bicarb and Narcan 2 mg, NS 1L for sbp 90s. Intubated at outside facility. Extubated on 1/12 and evaluated by PSY 1/13.He was transferred to hospitalist service on 02/24/2014.    Hospital Course    1. Overdose. Question  suicide attempt versus unintentional. Unclear what exactly he took. Urine drug screen was positive for benzodiazepine. Amitriptyline levels were high. He initially required endotracheal intubation and care under pulmonary critical care in ICU, he has since improved, psych was consulted on 02/21/2014 and medications were adjusted. He was transferred to hospitalist service on 02/24/2014. He is now back to baseline cleared for discharge by psychiatry. Of note for the last 4 days patient and family have refused all benzodiazepines and psych medications. Per psychiatry okay to hold all psych medications and have him follow with outpatient psychiatrist for further management. Patient and family do not wish to take any more benzodiazepines or any psych medications.    2. Acute respiratory failure. Due to chemical encephalopathy from drug overdose. Requiring intubation. Currently resolved on room air.    3. CAP. Cultures positive for Haemophilus influenza. Finished Levaquin treatment. Him to free and nontoxic, repeat CBC in 2 view chest x-ray in a week.    4. Long QTC on admission. Secondary to psych medications and overdose, resolved QTC down to 453.    5. Dehydration with acute renal failure  and hypernatremia. Resolved with IV fluids.      Code Status: Full   Discharge Condition: Stable   Follow UP  Follow-up Information    Follow up with Your primary care physician and your psychiatrist. Schedule an appointment as soon as possible for a visit in 3 days.        Discharge Instructions  and  Discharge Medications     Discharge Instructions    Diet - low sodium heart healthy    Complete by:  As directed      Discharge instructions    Complete by:  As directed   Follow with Primary MD and your psychiatrist within 3-4 days   Get CBC, CMP, 2 view Chest X ray checked  by Primary MD next visit.    Activity: As tolerated with Full fall precautions use walker/cane & assistance as  needed   Disposition Home     Diet: Heart Healthy    For Heart failure patients - Check your Weight same time everyday, if you gain over 2 pounds, or you develop in leg swelling, experience more shortness of breath or chest pain, call your Primary MD immediately. Follow Cardiac Low Salt Diet and 1.8 lit/day fluid restriction.   On your next visit with your primary care physician please Get Medicines reviewed and adjusted.   Please request your Prim.MD to go over all Hospital Tests and Procedure/Radiological results at the follow up, please get all Hospital records sent to your Prim MD by signing hospital release before you go home.   If you experience worsening of your admission symptoms, develop shortness of breath, life threatening emergency, suicidal or homicidal thoughts you must seek medical attention immediately by calling 911 or calling your MD immediately  if symptoms less severe.  You Must read complete instructions/literature along with all the possible adverse reactions/side effects for all the Medicines you take and that have been prescribed to you. Take any new Medicines after you have completely understood and accpet all the possible adverse reactions/side effects.   Do not drive, operating heavy machinery, perform activities at heights, swimming or participation in water activities or provide baby sitting services if your were admitted for syncope or siezures until you have seen by Primary MD or a Neurologist and advised to do so again.  Do not drive when taking Pain medications.    Do not take more than prescribed Pain, Sleep and Anxiety Medications  Special Instructions: If you have smoked or chewed Tobacco  in the last 2 yrs please stop smoking, stop any regular Alcohol  and or any Recreational drug use.  Wear Seat belts while driving.   Please note  You were cared for by a hospitalist during your hospital stay. If you have any questions about your discharge  medications or the care you received while you were in the hospital after you are discharged, you can call the unit and asked to speak with the hospitalist on call if the hospitalist that took care of you is not available. Once you are discharged, your primary care physician will handle any further medical issues. Please note that NO REFILLS for any discharge medications will be authorized once you are discharged, as it is imperative that you return to your primary care physician (or establish a relationship with a primary care physician if you do not have one) for your aftercare needs so that they can reassess your need for medications and monitor your lab values.     Increase activity  slowly    Complete by:  As directed             Medication List    STOP taking these medications        amitriptyline 25 MG tablet  Commonly known as:  ELAVIL     citalopram 20 MG tablet  Commonly known as:  CELEXA     diazepam 2 MG tablet  Commonly known as:  VALIUM     QUEtiapine 25 MG tablet  Commonly known as:  SEROQUEL      TAKE these medications        ALEVE 220 MG tablet  Generic drug:  naproxen sodium  Take 440 mg by mouth 2 (two) times daily as needed (pain).     amLODipine 10 MG tablet  Commonly known as:  NORVASC  Take 10 mg by mouth daily.     diclofenac sodium 1 % Gel  Commonly known as:  VOLTAREN  Apply 2 g topically 4 (four) times daily as needed (for pain).     hydrochlorothiazide 50 MG tablet  Commonly known as:  HYDRODIURIL  Take 50 mg by mouth daily.     oxyCODONE-acetaminophen 5-325 MG per tablet  Commonly known as:  PERCOCET/ROXICET  Take 1 tablet by mouth every 8 (eight) hours as needed for moderate pain or severe pain.          Diet and Activity recommendation: See Discharge Instructions above   Consults obtained - pulmonary critical care, psychiatry   Major procedures and Radiology Reports - PLEASE review detailed and final reports for all details, in  brief -      Ct Head Wo Contrast  02/19/2014   CLINICAL DATA:  Recent overdose, unresponsive.  EXAM: CT HEAD WITHOUT CONTRAST  TECHNIQUE: Contiguous axial images were obtained from the base of the skull through the vertex without intravenous contrast.  COMPARISON:  10/27/2013.  FINDINGS: No evidence of an acute infarct, acute hemorrhage, mass lesion, mass effect or hydrocephalus. No air-fluid levels in the visualized portions of the paranasal sinuses are mastoid air cells.  IMPRESSION: Normal brain.   Electronically Signed   By: Lorin Picket M.D.   On: 02/19/2014 13:00   Dg Chest Port 1 View  02/24/2014   CLINICAL DATA:  Shortness of breath.  EXAM: PORTABLE CHEST - 1 VIEW  COMPARISON:  02/22/2014  FINDINGS: Near complete resolution of bilateral pulmonary airspace disease is seen since prior exam. There is persistent mild atelectasis or infiltrate in the right lung base. No evidence of pneumothorax or significant pleural effusion. Heart size is normal.  IMPRESSION: Near complete resolution of bilateral pulmonary airspace disease. Persistent mild right basilar atelectasis versus infiltrate.   Electronically Signed   By: Earle Gell M.D.   On: 02/24/2014 09:16   Dg Chest Port 1 View  02/22/2014   CLINICAL DATA:  Respiratory failure.  EXAM: PORTABLE CHEST - 1 VIEW  COMPARISON:  02/21/2014.  02/19/2014.  FINDINGS: Mediastinum and hilar structures are normal. Stable cardiomegaly and pulmonary venous congestion. Persistent bilateral pulmonary infiltrates with increase in alveolar infiltrates in both lungs. Small right pleural effusion. No pneumothorax.  IMPRESSION: 1. Findings consistent with persistent congestive heart failure and pulmonary edema. 2. Bilateral pulmonary infiltrates have increased from prior exam. Superimposed pneumonia cannot be excluded. Small right pleural effusion.   Electronically Signed   By: Marcello Moores  Register   On: 02/22/2014 07:31   Dg Chest Port 1 View  02/21/2014   CLINICAL DATA:   Pneumonia .  EXAM: PORTABLE CHEST - 1 VIEW  COMPARISON:  02/19/2014.  FINDINGS: Mediastinum and hilar structures are normal. Mild cardiomegaly and pulmonary venous congestion. Bilateral pulmonary interstitial and alveolar infiltrates right side pleural effusion. These findings suggest congestive heart failure. Persistent atelectasis and infiltrate right lower lobe suggesting pneumonia. No pneumothorax. No acute osseous abnormality.  IMPRESSION: 1. Cardiomegaly with pulmonary venous congestion and new onset bilateral pulmonary interstitial and alveolar infiltrates consistent with congestive heart failure now noted. Small right pleural effusion. 2. Persistent right lower lobe atelectasis and infiltrate suggesting pneumonia.   Electronically Signed   By: Marcello Moores  Register   On: 02/21/2014 07:32   Dg Chest Port 1 View  02/19/2014   CLINICAL DATA:  Respiratory failure  EXAM: PORTABLE CHEST - 1 VIEW  COMPARISON:  02/18/2014.  FINDINGS: Endotracheal tube, NG tube in stable position. Developing infiltrate and atelectasis right lung base. No pleural effusion or pneumothorax. Heart size normal. No acute osseus abnormality.  IMPRESSION: 1.  Lines and tubes in stable position.  2.  Developing infiltrate and atelectasis right lower lobe.   Electronically Signed   By: Marcello Moores  Register   On: 02/19/2014 07:22   Dg Chest Port 1 View  02/18/2014   CLINICAL DATA:  54 year old male with respiratory failure.  EXAM: PORTABLE CHEST - 1 VIEW  COMPARISON:  No priors.  FINDINGS: An endotracheal tube is in place with tip 2.1 cm above the carina. A nasogastric tube is seen extending into the stomach, however, the tip of the nasogastric tube extends below the lower margin of the image. Lung volumes are low. No consolidative airspace disease. No pleural effusions. No pneumothorax. No pulmonary nodule or mass noted. Pulmonary vasculature and the cardiomediastinal silhouette are within normal limits.  IMPRESSION: 1. Support apparatus, as  above. Endotracheal tube is slightly low, and could be withdrawn 2 cm for more optimal placement. 2. Low lung volumes without radiographic evidence of acute cardiopulmonary disease.   Electronically Signed   By: Vinnie Langton M.D.   On: 02/18/2014 14:21    Micro Results     Recent Results (from the past 240 hour(s))  MRSA PCR Screening     Status: None   Collection Time: 02/18/14 11:33 AM  Result Value Ref Range Status   MRSA by PCR NEGATIVE NEGATIVE Final    Comment:        The GeneXpert MRSA Assay (FDA approved for NASAL specimens only), is one component of a comprehensive MRSA colonization surveillance program. It is not intended to diagnose MRSA infection nor to guide or monitor treatment for MRSA infections.   Culture, respiratory (NON-Expectorated)     Status: None   Collection Time: 02/20/14 10:39 AM  Result Value Ref Range Status   Specimen Description TRACHEAL ASPIRATE  Final   Special Requests Normal  Final   Gram Stain   Final    ABUNDANT WBC PRESENT, PREDOMINANTLY PMN NO SQUAMOUS EPITHELIAL CELLS SEEN FEW GRAM NEGATIVE COCCOBACILLI RARE GRAM POSITIVE RODS RARE GRAM POSITIVE COCCI    Culture   Final    MODERATE HAEMOPHILUS INFLUENZAE Note: BETA LACTAMASE NEGATIVE Performed at [facility]    Report Status 02/22/2014 FINAL  Final       Today   Subjective:   Alvin Gonzalez today has no headache,no chest abdominal pain,no new weakness tingling or numbness, feels much better wants to go home today.   Objective:   Blood pressure 116/76, pulse 83, temperature 98.3 F (36.8 C), temperature source Oral, resp. rate 15, height  5\' 6"  (1.676 m), weight 69.31 kg (152 lb 12.8 oz), SpO2 96 %.   Intake/Output Summary (Last 24 hours) at 02/26/14 1218 Last data filed at 02/26/14 0900  Gross per 24 hour  Intake    760 ml  Output      0 ml  Net    760 ml    Exam Awake Alert, Oriented x 3, No new F.N deficits, Normal affect [place].AT,PERRAL Supple  Neck,No JVD, No cervical lymphadenopathy appriciated.  Symmetrical Chest wall movement, Good air movement bilaterally, CTAB RRR,No Gallops,Rubs or new Murmurs, No Parasternal Heave +ve B.Sounds, Abd Soft, Non tender, No organomegaly appriciated, No rebound -guarding or rigidity. No Cyanosis, Clubbing or edema, No new Rash or bruise  Data Review   CBC w Diff: Lab Results  Component Value Date   WBC 10.2 02/24/2014   HGB 13.4 02/24/2014   HCT 39.1 02/24/2014   PLT 328 02/24/2014   LYMPHOPCT 6* 02/18/2014   MONOPCT 5 02/18/2014   EOSPCT 0 02/18/2014   BASOPCT 0 02/18/2014    CMP: Lab Results  Component Value Date   NA 144 02/24/2014   K 4.0 02/24/2014   CL 111 02/24/2014   CO2 22 02/24/2014   BUN 20 02/24/2014   CREATININE 0.83 02/24/2014   PROT 5.4* 02/18/2014   ALBUMIN 3.0* 02/18/2014   BILITOT 0.3 02/18/2014   ALKPHOS 98 02/18/2014   AST 39* 02/18/2014   ALT 20 02/18/2014  .   Total Time in preparing paper work, data evaluation and todays exam - 35 minutes  Thurnell Lose M.D on 02/26/2014 at 12:18 PM  [facility] Office  [phone]

## 2014-02-26 NOTE — Discharge Instructions (Signed)
Follow with Primary MD and your psychiatrist within 3-4 days   Get CBC, CMP, 2 view Chest X ray checked  by Primary MD next visit.    Activity: As tolerated with Full fall precautions use walker/cane & assistance as needed   Disposition Home     Diet: Heart Healthy    For Heart failure patients - Check your Weight same time everyday, if you gain over 2 pounds, or you develop in leg swelling, experience more shortness of breath or chest pain, call your Primary MD immediately. Follow Cardiac Low Salt Diet and 1.8 lit/day fluid restriction.   On your next visit with your primary care physician please Get Medicines reviewed and adjusted.   Please request your Prim.MD to go over all Hospital Tests and Procedure/Radiological results at the follow up, please get all Hospital records sent to your Prim MD by signing hospital release before you go home.   If you experience worsening of your admission symptoms, develop shortness of breath, life threatening emergency, suicidal or homicidal thoughts you must seek medical attention immediately by calling 911 or calling your MD immediately  if symptoms less severe.  You Must read complete instructions/literature along with all the possible adverse reactions/side effects for all the Medicines you take and that have been prescribed to you. Take any new Medicines after you have completely understood and accpet all the possible adverse reactions/side effects.   Do not drive, operating heavy machinery, perform activities at heights, swimming or participation in water activities or provide baby sitting services if your were admitted for syncope or siezures until you have seen by Primary MD or a Neurologist and advised to do so again.  Do not drive when taking Pain medications.    Do not take more than prescribed Pain, Sleep and Anxiety Medications  Special Instructions: If you have smoked or chewed Tobacco  in the last 2 yrs please stop smoking, stop any  regular Alcohol  and or any Recreational drug use.  Wear Seat belts while driving.   Please note  You were cared for by a hospitalist during your hospital stay. If you have any questions about your discharge medications or the care you received while you were in the hospital after you are discharged, you can call the unit and asked to speak with the hospitalist on call if the hospitalist that took care of you is not available. Once you are discharged, your primary care physician will handle any further medical issues. Please note that NO REFILLS for any discharge medications will be authorized once you are discharged, as it is imperative that you return to your primary care physician (or establish a relationship with a primary care physician if you do not have one) for your aftercare needs so that they can reassess your need for medications and monitor your lab values.

## 2014-02-26 NOTE — Care Management Note (Signed)
    Page 1 of 1   02/26/2014     2:06:40 PM CARE MANAGEMENT NOTE 02/26/2014  Patient:  Alvin Gonzalez, Alvin Gonzalez   Account Number:  [id number]  Date Initiated:  02/19/2014  Documentation initiated by:  Luz Lex  Subjective/Objective Assessment:   Admitted with OD - intubated.     Action/Plan:   Anticipated DC Date:  02/26/2014   Anticipated DC Plan:  [place] referral  Clinical Social Worker      DC Planning Services  CM consult      Choice offered to / List presented to:          [facility] arranged  [place] - 11 Patient Refused      Status of service:  Completed, signed off Medicare Important Message given?  NO (If response is "NO", the following Medicare IM given date fields will be blank) Date Medicare IM given:   Medicare IM given by:   Date Additional Medicare IM given:   Additional Medicare IM given by:    Discharge Disposition:  HOME/SELF CARE  Per UR Regulation:  Reviewed for med. necessity/level of care/duration of stay  If discussed at [place] of Stay Meetings, dates discussed:    Comments:  ContactEnid Cutter  [phone]     [name],[name] Significant other [phone]   02/26/13 1400 [place] Received referral to arrange home health RN/PT/aide. Talked with pt and significant other - pt refuses home health services.  He states that he has a seat in the shower, has help with bathing and dressing.  Also states he has cane, walker, and w/c @ home, feels he does not RN or PT services.  Pt's primary care is @ [facility] and he is aware that home health services can be ordered by PCP if needed in the future.

## 2014-02-26 NOTE — Progress Notes (Signed)
Rehab Admissions Coordinator Note:  Patient was screened by Retta Diones for appropriateness for an Inpatient Acute Rehab Consult.  At this time, we are recommending Inpatient Rehab consult.  Retta Diones 02/26/2014, 9:16 AM  I can be reached at [phone].

## 2014-04-05 ENCOUNTER — Emergency Department: Payer: Self-pay | Admitting: Student

## 2014-04-06 ENCOUNTER — Emergency Department: Payer: Self-pay | Admitting: Student

## 2014-05-07 ENCOUNTER — Emergency Department: Payer: Self-pay | Admitting: Emergency Medicine

## 2014-07-10 ENCOUNTER — Emergency Department
Admission: EM | Admit: 2014-07-10 | Discharge: 2014-07-11 | Disposition: A | Discharge status: Home / Self Care | Payer: Self-pay | Attending: Emergency Medicine | Admitting: Emergency Medicine

## 2014-07-10 ENCOUNTER — Encounter: Payer: Self-pay | Admitting: *Deleted

## 2014-07-10 DIAGNOSIS — I10 Essential (primary) hypertension: Secondary | ICD-10-CM | POA: Insufficient documentation

## 2014-07-10 DIAGNOSIS — Z720 Tobacco use: Secondary | ICD-10-CM | POA: Insufficient documentation

## 2014-07-10 DIAGNOSIS — F10920 Alcohol use, unspecified with intoxication, uncomplicated: Secondary | ICD-10-CM

## 2014-07-10 DIAGNOSIS — F4310 Post-traumatic stress disorder, unspecified: Secondary | ICD-10-CM | POA: Insufficient documentation

## 2014-07-10 DIAGNOSIS — F10120 Alcohol abuse with intoxication, uncomplicated: Secondary | ICD-10-CM | POA: Insufficient documentation

## 2014-07-10 DIAGNOSIS — T50901A Poisoning by unspecified drugs, medicaments and biological substances, accidental (unintentional), initial encounter: Secondary | ICD-10-CM | POA: Diagnosis present

## 2014-07-10 DIAGNOSIS — T1491XA Suicide attempt, initial encounter: Secondary | ICD-10-CM | POA: Diagnosis present

## 2014-07-10 DIAGNOSIS — R451 Restlessness and agitation: Secondary | ICD-10-CM

## 2014-07-10 HISTORY — DX: Intermittent explosive disorder: F63.81

## 2014-07-10 HISTORY — DX: Anxiety disorder, unspecified: F41.9

## 2014-07-10 LAB — CBC WITH DIFFERENTIAL/PLATELET
BASOS ABS: 0 10*3/uL (ref 0–0.1)
BASOS PCT: 1 %
EOS ABS: 0.1 10*3/uL (ref 0–0.7)
Eosinophils Relative: 3 %
HCT: 40.3 % (ref 40.0–52.0)
Hemoglobin: 13.5 g/dL (ref 13.0–18.0)
Lymphocytes Relative: 40 %
Lymphs Abs: 2.2 10*3/uL (ref 1.0–3.6)
MCH: 28.5 pg (ref 26.0–34.0)
MCHC: 33.4 g/dL (ref 32.0–36.0)
MCV: 85.4 fL (ref 80.0–100.0)
MONOS PCT: 7 %
Monocytes Absolute: 0.4 10*3/uL (ref 0.2–1.0)
NEUTROS PCT: 49 %
Neutro Abs: 2.7 10*3/uL (ref 1.4–6.5)
Platelets: 218 10*3/uL (ref 150–440)
RBC: 4.72 MIL/uL (ref 4.40–5.90)
RDW: 14.7 % — ABNORMAL HIGH (ref 11.5–14.5)
WBC: 5.5 10*3/uL (ref 3.8–10.6)

## 2014-07-10 LAB — COMPREHENSIVE METABOLIC PANEL
ALBUMIN: 3.6 g/dL (ref 3.5–5.0)
ALT: 19 U/L (ref 17–63)
ANION GAP: 7 (ref 5–15)
AST: 26 U/L (ref 15–41)
Alkaline Phosphatase: 93 U/L (ref 38–126)
BILIRUBIN TOTAL: 0.5 mg/dL (ref 0.3–1.2)
BUN: 7 mg/dL (ref 6–20)
CO2: 28 mmol/L (ref 22–32)
Calcium: 9 mg/dL (ref 8.9–10.3)
Chloride: 107 mmol/L (ref 101–111)
Creatinine, Ser: 0.96 mg/dL (ref 0.61–1.24)
GFR calc Af Amer: >60 mL/min (ref >60)
GFR calc non Af Amer: >60 mL/min (ref >60)
Glucose, Bld: 79 mg/dL (ref 65–99)
Potassium: 3.8 mmol/L (ref 3.5–5.1)
SODIUM: 142 mmol/L (ref 135–145)
Total Protein: 6.3 g/dL — ABNORMAL LOW (ref 6.5–8.1)

## 2014-07-10 LAB — SALICYLATE LEVEL

## 2014-07-10 LAB — ETHANOL

## 2014-07-10 LAB — ACETAMINOPHEN LEVEL: Acetaminophen (Tylenol), Serum: <10 ug/mL — ABNORMAL LOW (ref 10–30)

## 2014-07-10 MED ORDER — LORAZEPAM 2 MG/ML IJ SOLN
2.0000 mg | Freq: Once | INTRAMUSCULAR | Status: AC
  Administered 2014-07-10: 2 mg via INTRAMUSCULAR

## 2014-07-10 MED ORDER — DIPHENHYDRAMINE HCL 50 MG/ML IJ SOLN
50.0000 mg | Freq: Once | INTRAMUSCULAR | Status: AC
  Administered 2014-07-10: 50 mg via INTRAMUSCULAR

## 2014-07-10 MED ORDER — ZIPRASIDONE MESYLATE 20 MG IM SOLR
INTRAMUSCULAR | Status: AC
  Administered 2014-07-10: 20 mg via INTRAMUSCULAR
  Filled 2014-07-10: qty 20

## 2014-07-10 MED ORDER — LORAZEPAM 2 MG/ML IJ SOLN
INTRAMUSCULAR | Status: AC
  Administered 2014-07-10: 2 mg via INTRAMUSCULAR
  Filled 2014-07-10: qty 1

## 2014-07-10 MED ORDER — DIPHENHYDRAMINE HCL 50 MG/ML IJ SOLN
INTRAMUSCULAR | Status: AC
  Administered 2014-07-10: 50 mg via INTRAMUSCULAR
  Filled 2014-07-10: qty 1

## 2014-07-10 MED ORDER — ZIPRASIDONE MESYLATE 20 MG IM SOLR
20.0000 mg | Freq: Once | INTRAMUSCULAR | Status: AC
  Administered 2014-07-10: 20 mg via INTRAMUSCULAR

## 2014-07-10 NOTE — ED Provider Notes (Signed)
[facility] Emergency Department Provider Note  ____________________________________________  Time seen: On arrival  I have reviewed the triage vital signs and the nursing notes.   HISTORY  Chief Complaint Mental Health Problem    HPI Alvin Gonzalez. is a 54 y.o. male with a history of depression and PTSD who presents to the emergency department today under an involuntary commitment. Patient says he was having disagreements with his girlfriend over his pain medications. The patient says that his girlfriend is schizophrenic and was repeatedly asking about his pain meds today. The patient says he became frustrated and drank a 40 ounce. Patient also says that he smokes marijuana. Denies any other ingestions today. Denies any suicidal or homicidal ideations. Denies any hallucinations at this time. Brought in by police in handcuffs because of not being cooperative in the field.In reference to "passing out on the floor." The patient says that he lay down on his kitchen floor. He denies any chest pain or shortness of breath.   Past Medical History  Diagnosis Date  . Hypertension   . Tobacco abuse   . Depression   . Suicide attempt     prior to 02/18/14 admission   . Chronic back pain     Patient Active Problem List   Diagnosis Date Noted  . Overdose 02/18/2014  . Hypokalemia 02/18/2014  . Suicide attempt 02/18/2014  . Respiratory failure 02/18/2014  . Hypotension 02/18/2014  . Tachycardia 02/18/2014  . AKI (acute kidney injury) 02/18/2014  . Leukocytosis 02/18/2014  . Acute encephalopathy 02/18/2014    Past Surgical History  Procedure Laterality Date  . Lymph node biopsy      Current Outpatient Rx  Name  Route  Sig  Dispense  Refill  . amitriptyline (ELAVIL) 25 MG tablet   Oral   Take 75 mg by mouth every evening.         [name] FLUoxetine (PROZAC) 20 MG capsule   Oral   Take 20 mg by mouth every morning.         [name] lisinopril  (PRINIVIL,ZESTRIL) 20 MG tablet   Oral   Take 20 mg by mouth daily.         . risperiDONE (RISPERDAL) 2 MG tablet   Oral   Take 2 mg by mouth every evening.         . diclofenac sodium (VOLTAREN) 1 % GEL   Topical   Apply 2 g topically 4 (four) times daily as needed (for pain).         . hydrochlorothiazide (HYDRODIURIL) 50 MG tablet   Oral   Take 50 mg by mouth daily.         . naproxen sodium (ALEVE) 220 MG tablet   Oral   Take 440 mg by mouth 2 (two) times daily as needed (pain).         [name] oxyCODONE-acetaminophen (PERCOCET/ROXICET) 5-325 MG per tablet   Oral   Take 1 tablet by mouth every 8 (eight) hours as needed for moderate pain or severe pain.   11 tablet   0     Allergies Ibuprofen and Tegretol  No family history on file.  Social History History  Substance Use Topics  . Smoking status: Current Every Day Smoker -- 0.50 packs/day    Types: Cigarettes  . Smokeless tobacco: Not on file  . Alcohol Use: Yes     Comment: occassional last night    Review of Systems Constitutional: No fever/chills Eyes: No visual  changes. ENT: No sore throat. Cardiovascular: Denies chest pain. Respiratory: Denies shortness of breath. Gastrointestinal: No abdominal pain.  No nausea, no vomiting.  No diarrhea.  No constipation. Genitourinary: Negative for dysuria. Musculoskeletal: Negative for back pain. Skin: Negative for rash. Neurological: Negative for headaches, focal weakness or numbness. Psychiatric: Agitated  10-point ROS otherwise negative.  ____________________________________________   PHYSICAL EXAM:  VITAL SIGNS: ED Triage Vitals  Enc Vitals Group     BP --      Pulse --      Resp --      Temp --      Temp src --      SpO2 --      Weight --      Height --      Head Cir --      Peak Flow --      Pain Score --      Pain Loc --      Pain Edu? --      Excl. in [place]? --     Constitutional: Alert and oriented. Appears agitated. We'll back to  quad area in wheelchair with hands cuffed behind the back. Eyes: Conjunctivae are normal. PERRL. EOMI. Head: Atraumatic. Nose: No congestion/rhinnorhea. Mouth/Throat: Mucous membranes are moist.  Oropharynx non-erythematous. Neck: No stridor.   Cardiovascular: Normal rate, regular rhythm. Grossly normal heart sounds.  Good peripheral circulation. Respiratory: Normal respiratory effort.  No retractions. Lungs CTAB. Gastrointestinal: Soft and nontender. No distention. No abdominal bruits. No CVA tenderness. Musculoskeletal: No lower extremity tenderness nor edema.  No joint effusions. Neurologic:  Normal speech and language. No gross focal neurologic deficits are appreciated. Speech is normal. No gait instability. Skin:  Skin is warm, dry and intact. No rash noted. Psychiatric: Agitated mood.[name] Appears mildly intoxicated.  ____________________________________________   LABS (all labs ordered are listed, but only abnormal results are displayed)  Labs Reviewed  CBC WITH DIFFERENTIAL/PLATELET  COMPREHENSIVE METABOLIC PANEL  ETHANOL  ACETAMINOPHEN LEVEL  SALICYLATE LEVEL  URINALYSIS COMPLETEWITH MICROSCOPIC ([place])  URINE DRUG SCREEN, QUALITATIVE ([place])   ____________________________________________  EKG   ____________________________________________  RADIOLOGY   ____________________________________________   PROCEDURES    ____________________________________________   INITIAL IMPRESSION / ASSESSMENT AND PLAN / ED COURSE  Pertinent labs & imaging results that were available during my care of the patient were reviewed by me and considered in my medical decision making (see chart for details).  Attempted to discuss with patient being cooperative and letting us change him into the psych area clothing. Patient says that he will not cooperate and that he wants medication to help calm him down. In the interest of his safety as well as the staff he was given 2 mg of  Ativan as well as 50 mg Benadryl 20 mg of Geodon. At 10:00 PM the patient is sitting up in bed eating a sandwich calmly. He says he is feeling better at this time. I believe the patient that he did lay down on his kitchen floor, likely secondary to his alcohol intoxication. ____________________________________________   FINAL CLINICAL IMPRESSION(S) / ED DIAGNOSES  Agitation. Alcohol intoxication. Acute, return visit  Orbie Pyo, MD 07/10/14 2303

## 2014-07-10 NOTE — ED Notes (Signed)
Pt to triage via w/c, accomp by [facility] PD officer for IVC which indicates pt taking opills and ETOH; pt uncooperative, refusing all interactions and protocols; charge nurse informed; pt informed that he is under commmitment and cannot refuse any treatment; pt voices understanding but continues to refuse

## 2014-07-10 NOTE — ED Notes (Signed)
BEHAVIORAL HEALTH ROUNDING Patient sleeping: No. Patient alert and oriented: yes Behavior appropriate: Yes.  ; If no, describe:  Nutrition and fluids offered: Yes  Toileting and hygiene offered: yes Sitter present: no Law enforcement present: Yes, ODS

## 2014-07-10 NOTE — ED Notes (Signed)

## 2014-07-11 DIAGNOSIS — T50904A Poisoning by unspecified drugs, medicaments and biological substances, undetermined, initial encounter: Secondary | ICD-10-CM

## 2014-07-11 DIAGNOSIS — F4310 Post-traumatic stress disorder, unspecified: Secondary | ICD-10-CM

## 2014-07-11 MED ORDER — AMITRIPTYLINE HCL 25 MG PO TABS
25.0000 mg | ORAL_TABLET | Freq: Every day | ORAL | Status: DC
  Filled 2014-07-11: qty 1

## 2014-07-11 MED ORDER — NAPROXEN 250 MG PO TABS
375.0000 mg | ORAL_TABLET | Freq: Two times a day (BID) | ORAL | Status: DC
  Filled 2014-07-11 (×2): qty 1

## 2014-07-11 MED ORDER — LISINOPRIL 10 MG PO TABS
20.0000 mg | ORAL_TABLET | Freq: Every day | ORAL | Status: DC
  Administered 2014-07-11: 20 mg via ORAL

## 2014-07-11 MED ORDER — HYDROCHLOROTHIAZIDE 25 MG PO TABS
50.0000 mg | ORAL_TABLET | Freq: Every day | ORAL | Status: DC
  Administered 2014-07-11: 50 mg via ORAL

## 2014-07-11 MED ORDER — FLUOXETINE HCL 20 MG PO CAPS
ORAL_CAPSULE | ORAL | Status: AC
  Administered 2014-07-11: 20 mg via ORAL
  Filled 2014-07-11: qty 1

## 2014-07-11 MED ORDER — RISPERIDONE 1 MG PO TABS: 2.0000 mg | ORAL_TABLET | Freq: Every day | ORAL | Status: DC

## 2014-07-11 MED ORDER — LISINOPRIL 20 MG PO TABS
ORAL_TABLET | ORAL | Status: AC
  Administered 2014-07-11: 20 mg via ORAL
  Filled 2014-07-11: qty 1

## 2014-07-11 MED ORDER — FLUOXETINE HCL 20 MG PO CAPS
20.0000 mg | ORAL_CAPSULE | Freq: Every day | ORAL | Status: DC
  Administered 2014-07-11: 20 mg via ORAL

## 2014-07-11 MED ORDER — HYDROCHLOROTHIAZIDE 25 MG PO TABS
ORAL_TABLET | ORAL | Status: AC
  Administered 2014-07-11: 50 mg via ORAL
  Filled 2014-07-11: qty 2

## 2014-07-11 NOTE — Discharge Instructions (Signed)
Alcohol Use Disorder Alcohol use disorder is a mental disorder. It is not a one-time incident of heavy drinking. Alcohol use disorder is the excessive and uncontrollable use of alcohol over time that leads to problems with functioning in one or more areas of daily living. People with this disorder risk harming themselves and others when they drink to excess. Alcohol use disorder also can cause other mental disorders, such as mood and anxiety disorders, and serious physical problems. People with alcohol use disorder often misuse other drugs.  Alcohol use disorder is common and widespread. Some people with this disorder drink alcohol to cope with or escape from negative life events. Others drink to relieve chronic pain or symptoms of mental illness. People with a family history of alcohol use disorder are at higher risk of losing control and using alcohol to excess.  SYMPTOMS  Signs and symptoms of alcohol use disorder may include the following:   Consumption ofalcohol inlarger amounts or over a longer period of time than intended.  Multiple unsuccessful attempts to cutdown or control alcohol use.   A great deal of time spent obtaining alcohol, using alcohol, or recovering from the effects of alcohol (hangover).  A strong desire or urge to use alcohol (cravings).   Continued use of alcohol despite problems at work, school, or home because of alcohol use.   Continued use of alcohol despite problems in relationships because of alcohol use.  Continued use of alcohol in situations when it is physically hazardous, such as driving a car.  Continued use of alcohol despite awareness of a physical or psychological problem that is likely related to alcohol use. Physical problems related to alcohol use can involve the brain, heart, liver, stomach, and intestines. Psychological problems related to alcohol use include intoxication, depression, anxiety, psychosis, delirium, and dementia.   The need for  increased amounts of alcohol to achieve the same desired effect, or a decreased effect from the consumption of the same amount of alcohol (tolerance).  Withdrawal symptoms upon reducing or stopping alcohol use, or alcohol use to reduce or avoid withdrawal symptoms. Withdrawal symptoms include:  Racing heart.  Hand tremor.  Difficulty sleeping.  Nausea.  Vomiting.  Hallucinations.  Restlessness.  Seizures. DIAGNOSIS Alcohol use disorder is diagnosed through an assessment by your health care provider. Your health care provider may start by asking three or four questions to screen for excessive or problematic alcohol use. To confirm a diagnosis of alcohol use disorder, at least two symptoms must be present within a 12-month period. The severity of alcohol use disorder depends on the number of symptoms:  Mild--two or three.  Moderate--four or five.  Severe--six or more. Your health care provider may perform a physical exam or use results from lab tests to see if you have physical problems resulting from alcohol use. Your health care provider may refer you to a mental health professional for evaluation. TREATMENT  Some people with alcohol use disorder are able to reduce their alcohol use to low-risk levels. Some people with alcohol use disorder need to quit drinking alcohol. When necessary, mental health professionals with specialized training in substance use treatment can help. Your health care provider can help you decide how severe your alcohol use disorder is and what type of treatment you need. The following forms of treatment are available:   Detoxification. Detoxification involves the use of prescription medicines to prevent alcohol withdrawal symptoms in the first week after quitting. This is important for people with a history of symptoms   of withdrawal and for heavy drinkers who are likely to have withdrawal symptoms. Alcohol withdrawal can be dangerous and, in severe cases, cause  death. Detoxification is usually provided in a hospital or in-patient substance use treatment facility.  Counseling or talk therapy. Talk therapy is provided by substance use treatment counselors. It addresses the reasons people use alcohol and ways to keep them from drinking again. The goals of talk therapy are to help people with alcohol use disorder find healthy activities and ways to cope with life stress, to identify and avoid triggers for alcohol use, and to handle cravings, which can cause relapse.  Medicines.Different medicines can help treat alcohol use disorder through the following actions:  Decrease alcohol cravings.  Decrease the positive reward response felt from alcohol use.  Produce an uncomfortable physical reaction when alcohol is used (aversion therapy).  Support groups. Support groups are run by people who have quit drinking. They provide emotional support, advice, and guidance. These forms of treatment are often combined. Some people with alcohol use disorder benefit from intensive combination treatment provided by specialized substance use treatment centers. Both inpatient and outpatient treatment programs are available. Document Released: 03/05/2004 Document Revised: 06/12/2013 Document Reviewed: 05/05/2012 ExitCare Patient Information 2015 [employer]. This information is not intended to replace advice given to you by your health care provider. Make sure you discuss any questions you have with your health care provider.  

## 2014-07-11 NOTE — ED Notes (Signed)
Per md Clapacs  Pt is to be discharged to home  - I will be awaiting discharge paperwork

## 2014-07-11 NOTE — ED Notes (Signed)
BEHAVIORAL HEALTH ROUNDING Patient sleeping: No. Patient alert and oriented: yes Behavior appropriate: Yes.  ; If no, describe:  Nutrition and fluids offered: Yes  Toileting and hygiene offered: Yes  Sitter present: no Law enforcement present: Yes, ODS 

## 2014-07-11 NOTE — ED Notes (Signed)
Pt transferred into ED BHU room 4    Patient assigned to appropriate care area. Patient oriented to unit/care area: Informed that, for their safety, care areas are designed for safety and monitored by security cameras at all times; Visiting hours and phone times explained to patient. Patient verbalizes understanding, and verbal contract for safety obtained.

## 2014-07-11 NOTE — ED Notes (Signed)
1/1 bags of belongings returned to the pt at this time  Keys and cigarette lighter held until pt clears the locked unit area   Discharge instructons reviewed with the pt and he verbalized agreement and understanding    Follow up with [facility] in [facility]

## 2014-07-11 NOTE — ED Notes (Signed)
BEHAVIORAL HEALTH ROUNDING Patient sleeping: Yes.   Patient alert and oriented: asleep Behavior appropriate: Yes.  ; If no, describe:  Nutrition and fluids offered: asleep Toileting and hygiene offered: asleep Sitter present: no Law enforcement present: Yes, ODS 

## 2014-07-11 NOTE — ED Notes (Signed)
Pt awoke with loose stool and ran to bathroom. Pt's underwear was soiled. Pt provided with wipes to clean up and clean underwear and pants. Pt back to bed.

## 2014-07-11 NOTE — ED Notes (Signed)
BEHAVIORAL HEALTH ROUNDING Patient sleeping: No. Patient alert and oriented: yes Behavior appropriate: Yes.  ; If no, describe:  Nutrition and fluids offered: Yes  Toileting and hygiene offered: Yes  Sitter present: yes Law enforcement present: Yes  

## 2014-07-11 NOTE — ED Notes (Addendum)
Pt resting in bed with eyes closed. No unusual behavior observed. Pt has no needs or concerns at this time. Will continue to monitor and f/u as needed.  
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
## 2014-07-11 NOTE — ED Notes (Signed)
Pt resting in bed with eyes closed. No unusual behavior observed. Pt has no needs or concerns at this time. Will continue to monitor and f/u as needed.  

## 2014-07-11 NOTE — ED Notes (Signed)
BEHAVIORAL HEALTH ROUNDING Patient sleeping: Yes.   Patient alert and oriented: yes Behavior appropriate: Yes.  ; If no, describe:  Nutrition and fluids offered: No Toileting and hygiene offered: No due to him sleeping Sitter present: yes Law enforcement present: Yes

## 2014-07-11 NOTE — ED Notes (Signed)

## 2014-07-11 NOTE — ED Notes (Signed)
Pt provided with sandwich tray and drink.  

## 2014-07-11 NOTE — ED Notes (Signed)
BEHAVIORAL HEALTH ROUNDING Patient sleeping: Yes.   Patient alert and oriented: not applicable Behavior appropriate: Yes.  ; If no, describe:  Nutrition and fluids offered: Yes  Toileting and hygiene offered: Yes  Sitter present: yes Law enforcement present: Yes  

## 2014-07-11 NOTE — ED Provider Notes (Signed)
-----------------------------------------   4:16 PM on 07/11/2014 -----------------------------------------  Vital signs stable and unremarkable. Patient care discussed with Dr. Weber Cooks in the ED after his evaluation. The patient is currently medically and psychiatrically stable. We'll discharge him home to follow up with [facility] for continued monitoring of his symptoms.  Clinical impression is alcohol abuse  Carrie Mew, MD 07/11/14 1616

## 2014-07-11 NOTE — ED Notes (Signed)
Behavioral intake specialist at bedside.

## 2014-07-11 NOTE — ED Notes (Signed)
Pt has a wallet with various cards and ID and $1.20 in coins locked up with security. Key was placed in Pyxis in major ED. Yellow receipt was put in pt's belonging bag and locked up in [facility] locker.

## 2014-07-11 NOTE — ED Notes (Signed)
BEHAVIORAL HEALTH ROUNDING Patient sleeping: No. Patient alert and oriented: yes Behavior appropriate: Yes.  ; If no, describe:  Nutrition and fluids offered: yes Toileting and hygiene offered: Yes  Sitter present: q15 minute observations and security camera monitoring Law enforcement present: Yes  ODS  

## 2014-07-11 NOTE — ED Notes (Signed)
.  Pt resting in bed with eyes closed. No unusual behavior observed. Pt has no needs or concerns at this time. Will continue to monitor and f/u as needed.  

## 2014-07-11 NOTE — Consult Note (Addendum)
[place] [facility] Consult   Reason for Consult:  Consult for this patient brought in under involuntary commitment alleging overdose Referring Physician:  gayle Patient Identification: Alvin Gonzalez. MRN:  [id number] Principal Diagnosis: Overdose Diagnosis:   Patient Active Problem List   Diagnosis Date Noted  . Overdose [T50.901A] 02/18/2014  . Hypokalemia [E87.6] 02/18/2014  . Suicide attempt [T14.91] 02/18/2014  . Respiratory failure [J96.90] 02/18/2014  . Hypotension [I95.9] 02/18/2014  . Tachycardia [R00.0] 02/18/2014  . AKI (acute kidney injury) [N17.9] 02/18/2014  . Leukocytosis [D72.829] 02/18/2014  . Acute encephalopathy [G93.40] 02/18/2014    Total Time spent with patient: 1 hour  Subjective:   Alvin Gonzalez. is a 54 y.o. male patient admitted with "my girlfriend thought that I overdosed". Commitment paperwork alleges that he overdosed on medicine. Patient denies it.[name]  HPI:  Information from the patient and the chart. Commitment paperwork filed by his girlfriend states that he had overdosed on medicines. The patient states that his girlfriend was mistaken and he did not actually overdose. He says that he only takes the pills that he is prescribed. He is apparently still taking Valium Vicodin and amitriptyline as part of his medicine regimen. He says that he also drank 40 ounce beer yesterday and has been doing that more and more frequently. His mood has been somewhat anxious and irritable that he will not admit to being depressed. He has chronic sleep problems. He denies any suicidal ideation at all. Denies any hallucinations. Says that he thinks that his relationship with his girlfriend is bad and is causing him extra stress.  Patient has a past psychiatric history of a diagnosis of PTSD. He sees doctors at [facility] for treatment of that. He says that he took an accidental overdose about a year ago of his medicine but denies that he was trying to kill  himself. He's had 1 previous psychiatric hospitalization in the past he says that his diagnosis is PTSD although he insinuates that he has other diagnoses that he doesn't specify.  Social history: Patient lives with his girlfriend. Just the 2 of them at home. Not working. Does have her relationship with his family.  Medical history: History of high blood pressure. History of hypokalemia tachycardia respiratory failure all in the previous context of medicine overdose.  Family history: Jon Gills was an alcohol abuser  Current medication: Patient does not know the full list at this time but says he still being prescribed Valium and Vicodin and Elavil.   HPI Elements:   Quality:  Came into the hospital sedated with allegations of an overdose.. Severity:  Mild to moderate. Timing:  Appears to have happened yesterday.. Duration:  Recurrent ongoing problem with his medication. Context:  Conflicted relationship with his partner. Ongoing substance.  Past Medical History:  Past Medical History  Diagnosis Date  . Hypertension   . Tobacco abuse   . Depression   . Suicide attempt     prior to 02/18/14 admission   . Chronic back pain   . Intermittent explosive disorder   . Anxiety   . Depression     Past Surgical History  Procedure Laterality Date  . Lymph node biopsy     Family History: No family history on file. Social History:  History  Alcohol Use  . 10.8 oz/week  . 18 Cans of beer per week    Comment: occassional last night     History  Drug Use  . 3.00 per week  . Special: Marijuana  History   Social History  . Marital Status: Single    Spouse Name: N/A  . Number of Children: N/A  . Years of Education: N/A   Social History Main Topics  . Smoking status: Current Every Day Smoker -- 1.00 packs/day    Types: Cigarettes  . Smokeless tobacco: Not on file  . Alcohol Use: 10.8 oz/week    18 Cans of beer per week     Comment: occassional last night  . Drug Use: 3.00  per week    Special: Marijuana  . Sexual Activity: Yes   Other Topics Concern  . None   Social History Narrative   Additional Social History:    Pain Medications: None Reported Prescriptions: None Reported Over the Counter: None Reported History of alcohol / drug use?: Yes Longest period of sobriety (when/how long): None Reported Negative Consequences of Use:  (None Reported) Withdrawal Symptoms:  (None Reported) Name of Substance 1: Alcohol 1 - Age of First Use: 15 1 - Amount (size/oz): 40oz a day 1 - Frequency: Daily for the last month 1 - Duration: 20 years, "on and off" 1 - Last Use / Amount: 07/10/2014                   Allergies:   Allergies  Allergen Reactions  . Ibuprofen Nausea And Vomiting  . Tegretol [Carbamazepine] Rash    Labs:  Results for orders placed or performed during the hospital encounter of 07/10/14 (from the past 48 hour(s))  CBC with Differential     Status: Abnormal   Collection Time: 07/10/14  9:44 PM  Result Value Ref Range   WBC 5.5 3.8 - 10.6 K/uL   RBC 4.72 4.40 - 5.90 MIL/uL   Hemoglobin 13.5 13.0 - 18.0 g/dL   HCT 40.3 40.0 - 52.0 %   MCV 85.4 80.0 - 100.0 fL   MCH 28.5 26.0 - 34.0 pg   MCHC 33.4 32.0 - 36.0 g/dL   RDW 14.7 (H) 11.5 - 14.5 %   Platelets 218 150 - 440 K/uL   Neutrophils Relative % 49 %   Neutro Abs 2.7 1.4 - 6.5 K/uL   Lymphocytes Relative 40 %   Lymphs Abs 2.2 1.0 - 3.6 K/uL   Monocytes Relative 7 %   Monocytes Absolute 0.4 0.2 - 1.0 K/uL   Eosinophils Relative 3 %   Eosinophils Absolute 0.1 0 - 0.7 K/uL   Basophils Relative 1 %   Basophils Absolute 0.0 0 - 0.1 K/uL  Comprehensive metabolic panel     Status: Abnormal   Collection Time: 07/10/14  9:44 PM  Result Value Ref Range   Sodium 142 135 - 145 mmol/L   Potassium 3.8 3.5 - 5.1 mmol/L   Chloride 107 101 - 111 mmol/L   CO2 28 22 - 32 mmol/L   Glucose, Bld 79 65 - 99 mg/dL   BUN 7 6 - 20 mg/dL   Creatinine, Ser 0.96 0.61 - 1.24 mg/dL   Calcium  9.0 8.9 - 10.3 mg/dL   Total Protein 6.3 (L) 6.5 - 8.1 g/dL   Albumin 3.6 3.5 - 5.0 g/dL   AST 26 15 - 41 U/L   ALT 19 17 - 63 U/L   Alkaline Phosphatase 93 38 - 126 U/L   Total Bilirubin 0.5 0.3 - 1.2 mg/dL   GFR calc non Af Amer >60 >60 mL/min   GFR calc Af Amer >60 >60 mL/min    Comment: (NOTE) The eGFR has been  calculated using the CKD EPI equation. This calculation has not been validated in all clinical situations. eGFR's persistently <60 mL/min signify possible Chronic Kidney Disease.    Anion gap 7 5 - 15  Ethanol     Status: None   Collection Time: 07/10/14  9:44 PM  Result Value Ref Range   Alcohol, Ethyl (B) <5 <5 mg/dL    Comment:        LOWEST DETECTABLE LIMIT FOR SERUM ALCOHOL IS 11 mg/dL FOR MEDICAL PURPOSES ONLY   Acetaminophen level     Status: Abnormal   Collection Time: 07/10/14  9:44 PM  Result Value Ref Range   Acetaminophen (Tylenol), Serum <10 (L) 10 - 30 ug/mL    Comment:        THERAPEUTIC CONCENTRATIONS VARY SIGNIFICANTLY. A RANGE OF 10-30 ug/mL MAY BE AN EFFECTIVE CONCENTRATION FOR MANY PATIENTS. HOWEVER, SOME ARE BEST TREATED AT CONCENTRATIONS OUTSIDE THIS RANGE. ACETAMINOPHEN CONCENTRATIONS >150 ug/mL AT 4 HOURS AFTER INGESTION AND >50 ug/mL AT 12 HOURS AFTER INGESTION ARE OFTEN ASSOCIATED WITH TOXIC REACTIONS.   Salicylate level     Status: None   Collection Time: 07/10/14  9:44 PM  Result Value Ref Range   Salicylate Lvl <1.9 2.8 - 30.0 mg/dL    Vitals: Blood pressure 149/98, pulse 66, temperature 97.7 F (36.5 C), temperature source Oral, resp. rate 20, height 5' 5"  (1.651 m), weight 68.04 kg (150 lb), SpO2 97 %.  Risk to Self: Suicidal Ideation: No Suicidal Intent: No Is patient at risk for suicide?: No Suicidal Plan?: No Access to Means: No What has been your use of drugs/alcohol within the last 12 months?: Alcohol How many times?: 0 Other Self Harm Risks: None Reported Triggers for Past Attempts: None known Intentional  Self Injurious Behavior: None Risk to Others: Homicidal Ideation: No Thoughts of Harm to Others: No Current Homicidal Intent: No Current Homicidal Plan: No Access to Homicidal Means: No Identified Victim: None Reported History of harm to others?: No Assessment of Violence: None Noted Violent Behavior Description: None Reported Does patient have access to weapons?: No Criminal Charges Pending?: No Does patient have a court date: No Prior Inpatient Therapy: Prior Inpatient Therapy: No Prior Therapy Dates: None Reported Prior Therapy Facilty/Provider(s): None Reported Reason for Treatment: None Reported Prior Outpatient Therapy: Prior Outpatient Therapy: Yes Prior Therapy Dates: Current Prior Therapy Facilty/Provider(s): [facility] Reason for Treatment: PTSD & Anxiety Does patient have an ACCT team?: No Does patient have [facility] services? : No Does patient have [facility] services?: No  Current Facility-Administered Medications  Medication Dose Route Frequency Provider Last Rate Last Dose  . amitriptyline (ELAVIL) tablet 25 mg  25 mg Oral QHS Joanne Gavel, MD      . FLUoxetine (PROZAC) capsule 20 mg  20 mg Oral Daily Joanne Gavel, MD   20 mg at 07/11/14 1116  . hydrochlorothiazide (HYDRODIURIL) tablet 50 mg  50 mg Oral Daily Joanne Gavel, MD   50 mg at 07/11/14 1115  . lisinopril (PRINIVIL,ZESTRIL) tablet 20 mg  20 mg Oral Daily Joanne Gavel, MD   20 mg at 07/11/14 1116  . naproxen (NAPROSYN) tablet 375 mg  375 mg Oral BID WC Joanne Gavel, MD      . risperiDONE (RISPERDAL) tablet 2 mg  2 mg Oral QHS Joanne Gavel, MD       Current Outpatient Prescriptions  Medication Sig Dispense Refill  . amitriptyline (ELAVIL) 25 MG tablet Take 75 mg by mouth  every evening.    [name] FLUoxetine (PROZAC) 20 MG capsule Take 20 mg by mouth every morning.    [name] lisinopril (PRINIVIL,ZESTRIL) 20 MG tablet Take 20 mg by mouth daily.    . risperiDONE (RISPERDAL) 2 MG tablet Take 2 mg by mouth  every evening.    . diclofenac sodium (VOLTAREN) 1 % GEL Apply 2 g topically 4 (four) times daily as needed (for pain).    . hydrochlorothiazide (HYDRODIURIL) 50 MG tablet Take 50 mg by mouth daily.    . naproxen sodium (ALEVE) 220 MG tablet Take 440 mg by mouth 2 (two) times daily as needed (pain).    [name] oxyCODONE-acetaminophen (PERCOCET/ROXICET) 5-325 MG per tablet Take 1 tablet by mouth every 8 (eight) hours as needed for moderate pain or severe pain. 11 tablet 0    Musculoskeletal: Strength & Muscle Tone: within normal limits Gait & Station: normal Patient leans: N/A  Psychiatric Specialty Exam: Physical Exam  Constitutional: He appears well-developed and well-nourished.  HENT:  Head: Normocephalic and atraumatic.  Eyes: Conjunctivae are normal. Pupils are equal, round, and reactive to light.  Neck: Normal range of motion.  Cardiovascular: Normal heart sounds.   Respiratory: Effort normal.  GI: Soft.  Musculoskeletal: Normal range of motion.  Neurological: He is alert.  Skin: Skin is warm. He is diaphoretic. There is erythema.  Psychiatric: His speech is normal. Judgment and thought content normal. His affect is blunt. He is slowed. Cognition and memory are impaired. He exhibits abnormal recent memory.    Review of Systems  Constitutional: Negative.   HENT: Negative.   Eyes: Negative.   Respiratory: Negative.   Cardiovascular: Negative.   Gastrointestinal: Negative.   Musculoskeletal: Negative.   Skin: Negative.   Neurological: Negative.   Psychiatric/Behavioral: Positive for memory loss and substance abuse. Negative for depression, suicidal ideas and hallucinations. The patient has insomnia. The patient is not nervous/anxious.     Blood pressure 149/98, pulse 66, temperature 97.7 F (36.5 C), temperature source Oral, resp. rate 20, height 5' 5"  (1.651 m), weight 68.04 kg (150 lb), SpO2 97 %.Body mass index is 24.96 kg/(m^2).  General Appearance: Disheveled  Eye Contact::   Good  Speech:  Slow  Volume:  Decreased  Mood:  Anxious  Affect:  Blunt  Thought Process:  Linear  Orientation:  Full (Time, Place, and Person)  Thought Content:  Negative  Suicidal Thoughts:  No  Homicidal Thoughts:  No  Memory:  Immediate;   Good Recent;   Fair Remote;   Fair  Judgement:  Fair  Insight:  Fair  Psychomotor Activity:  Normal  Concentration:  Fair  Recall:  [employer] of Knowledge:Fair  Language: Good  Akathisia:  No  Handed:  Right  AIMS (if indicated):     Assets:  Communication Skills Desire for Improvement Social Support  ADL's:  Intact  Cognition: Impaired,  Mild  Sleep:      Medical Decision Making: New problem, with additional work up planned, Review of Psycho-Social Stressors (1), Review or order clinical lab tests (1), Discuss test with performing physician (1), Decision to obtain old records (1) and Review of Medication Regimen & Side Effects (2)  Treatment Plan Summary: Plan Patient came into the hospital somewhat sedated. We do not have any drug screen back yet. He appears not to have been intoxicated when he came in. Currently he is alert and oriented. He still looks a little bit slowed. He is admitting that he drinks alcohol along with taking  prescription narcotics and benzodiazepine's but he denies any suicidal ideation or wishes at all. At this point patient does not appear to be an acute danger to himself and does not meet commitment criteria. He is already followed up at [facility]. Patient has been counseled about the dangers of combining sedative medications and alcohol together including the danger of accidental overdose and death. He is strongly encouraged to cut down on his alcohol use. Her judgment to talk about these episodes with his outpatient prescriber. No need for further inpatient treatment. Case discussed with emergency room doctor. He can be released from the emergency room at the discretion of the physician.  Plan:   patient can be  released from emergency room. No indication for prescriptions. Has outpatient treatment in place Disposition:   Alethia Berthold 07/11/2014 2:34 PM

## 2014-07-11 NOTE — ED Notes (Signed)
Supper provided along with an extra drink  Pt to be discharged to home  Pt observed with no unusual behavior  Appropriate to stimulation  No verbalized needs or concerns at this time  NAD assessed  Continue to monitor

## 2014-08-08 ENCOUNTER — Emergency Department
Admission: EM | Admit: 2014-08-08 | Discharge: 2014-08-09 | Disposition: A | Discharge status: Home / Self Care | Payer: Self-pay | Attending: Emergency Medicine | Admitting: Emergency Medicine

## 2014-08-08 ENCOUNTER — Encounter: Payer: Self-pay | Admitting: Emergency Medicine

## 2014-08-08 DIAGNOSIS — F430 Acute stress reaction: Secondary | ICD-10-CM | POA: Insufficient documentation

## 2014-08-08 DIAGNOSIS — R451 Restlessness and agitation: Secondary | ICD-10-CM

## 2014-08-08 DIAGNOSIS — I10 Essential (primary) hypertension: Secondary | ICD-10-CM | POA: Insufficient documentation

## 2014-08-08 DIAGNOSIS — F10920 Alcohol use, unspecified with intoxication, uncomplicated: Secondary | ICD-10-CM

## 2014-08-08 DIAGNOSIS — Z720 Tobacco use: Secondary | ICD-10-CM | POA: Insufficient documentation

## 2014-08-08 DIAGNOSIS — F10129 Alcohol abuse with intoxication, unspecified: Secondary | ICD-10-CM | POA: Insufficient documentation

## 2014-08-08 DIAGNOSIS — Z79899 Other long term (current) drug therapy: Secondary | ICD-10-CM | POA: Insufficient documentation

## 2014-08-08 LAB — CBC
HEMATOCRIT: 45.7 % (ref 40.0–52.0)
HEMOGLOBIN: 15 g/dL (ref 13.0–18.0)
MCH: 28.3 pg (ref 26.0–34.0)
MCHC: 32.9 g/dL (ref 32.0–36.0)
MCV: 85.9 fL (ref 80.0–100.0)
Platelets: 308 10*3/uL (ref 150–440)
RBC: 5.32 MIL/uL (ref 4.40–5.90)
RDW: 14 % (ref 11.5–14.5)
WBC: 7.5 10*3/uL (ref 3.8–10.6)

## 2014-08-08 LAB — COMPREHENSIVE METABOLIC PANEL
ALK PHOS: 105 U/L (ref 38–126)
ALT: 21 U/L (ref 17–63)
AST: 32 U/L (ref 15–41)
Albumin: 4.5 g/dL (ref 3.5–5.0)
Anion gap: 11 (ref 5–15)
BILIRUBIN TOTAL: 0.4 mg/dL (ref 0.3–1.2)
BUN: 7 mg/dL (ref 6–20)
CO2: 27 mmol/L (ref 22–32)
Calcium: 9.5 mg/dL (ref 8.9–10.3)
Chloride: 108 mmol/L (ref 101–111)
Creatinine, Ser: 1.01 mg/dL (ref 0.61–1.24)
GFR calc Af Amer: >60 mL/min (ref >60)
Glucose, Bld: 101 mg/dL — ABNORMAL HIGH (ref 65–99)
POTASSIUM: 3.6 mmol/L (ref 3.5–5.1)
SODIUM: 146 mmol/L — AB (ref 135–145)
Total Protein: 7.6 g/dL (ref 6.5–8.1)

## 2014-08-08 LAB — ETHANOL: Alcohol, Ethyl (B): 163 mg/dL — ABNORMAL HIGH (ref <5)

## 2014-08-08 LAB — ACETAMINOPHEN LEVEL: Acetaminophen (Tylenol), Serum: <10 ug/mL — ABNORMAL LOW (ref 10–30)

## 2014-08-08 LAB — SALICYLATE LEVEL

## 2014-08-08 MED ORDER — HALOPERIDOL LACTATE 5 MG/ML IJ SOLN
5.0000 mg | Freq: Once | INTRAMUSCULAR | Status: AC
  Administered 2014-08-08: 5 mg via INTRAMUSCULAR

## 2014-08-08 MED ORDER — HALOPERIDOL LACTATE 5 MG/ML IJ SOLN
INTRAMUSCULAR | Status: AC
  Administered 2014-08-08: 5 mg via INTRAMUSCULAR
  Filled 2014-08-08: qty 1

## 2014-08-08 MED ORDER — MIDAZOLAM HCL 5 MG/5ML IJ SOLN
INTRAMUSCULAR | Status: AC
  Administered 2014-08-08: 2 mg via INTRAMUSCULAR
  Filled 2014-08-08: qty 5

## 2014-08-08 MED ORDER — DIPHENHYDRAMINE HCL 50 MG/ML IJ SOLN
INTRAMUSCULAR | Status: AC
  Administered 2014-08-08: 25 mg via INTRAMUSCULAR
  Filled 2014-08-08: qty 1

## 2014-08-08 MED ORDER — DIPHENHYDRAMINE HCL 50 MG/ML IJ SOLN
25.0000 mg | Freq: Once | INTRAMUSCULAR | Status: AC
  Administered 2014-08-08: 25 mg via INTRAMUSCULAR

## 2014-08-08 MED ORDER — MIDAZOLAM HCL 2 MG/2ML IJ SOLN
2.0000 mg | Freq: Once | INTRAMUSCULAR | Status: AC
  Administered 2014-08-08: 2 mg via INTRAMUSCULAR

## 2014-08-08 NOTE — ED Notes (Addendum)
Patient presents in custody of [employer] officer; VOLUNTARY basis at this time - requesting detox. Patient states, "I just want to sleep." Of note, patient here on 5/31 for alcohol and opioid detox. Patient somnolent in triage - denies anything drug usel only alcohol. Reports that last drink was 2 hours PTA; "I have had two 24 packs today."
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
## 2014-08-09 LAB — URINALYSIS COMPLETE WITH MICROSCOPIC (ARMC ONLY)
Bacteria, UA: NONE SEEN
Bilirubin Urine: NEGATIVE
GLUCOSE, UA: NEGATIVE mg/dL
Hgb urine dipstick: NEGATIVE
Ketones, ur: NEGATIVE mg/dL
Leukocytes, UA: NEGATIVE
Nitrite: NEGATIVE
PROTEIN: NEGATIVE mg/dL
SPECIFIC GRAVITY, URINE: 1.011 (ref 1.005–1.030)
pH: 6 (ref 5.0–8.0)

## 2014-08-09 LAB — URINE DRUG SCREEN, QUALITATIVE (ARMC ONLY)
Amphetamines, Ur Screen: NOT DETECTED
Barbiturates, Ur Screen: NOT DETECTED
Benzodiazepine, Ur Scrn: POSITIVE — AB
COCAINE METABOLITE, UR ~~LOC~~: POSITIVE — AB
Cannabinoid 50 Ng, Ur ~~LOC~~: POSITIVE — AB
MDMA (Ecstasy)Ur Screen: NOT DETECTED
METHADONE SCREEN, URINE: NOT DETECTED
Opiate, Ur Screen: NOT DETECTED
Phencyclidine (PCP) Ur S: NOT DETECTED
Tricyclic, Ur Screen: POSITIVE — AB
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
## 2014-08-09 NOTE — ED Notes (Signed)
BEHAVIORAL HEALTH ROUNDING Patient sleeping: No. Patient alert and oriented: yes Behavior appropriate: Yes.  ; If no, describe:  Nutrition and fluids offered: Yes  Toileting and hygiene offered: Yes  Sitter present: no Law enforcement present: Yes  

## 2014-08-09 NOTE — ED Notes (Signed)
Patient began to take off pants, was not compliant with putting them back on. Patient became agitated, swearing and resisting efforts to calm him. Patient became combative and had to be restrained by security officers and ED staff.

## 2014-08-09 NOTE — ED Notes (Signed)
Report received from [facility]. Patient care assumed. Patient/RN introduction complete. Pt laying in bed at this time with eyes closed.  Will continue to monitor.

## 2014-08-09 NOTE — ED Notes (Signed)
No change in condition will continue to monitor  

## 2014-08-09 NOTE — ED Notes (Signed)
BEHAVIORAL HEALTH ROUNDING Patient sleeping: Yes.   Patient alert and oriented: not applicable Behavior appropriate: Yes.  ; If no, describe:  Nutrition and fluids offered: No Toileting and hygiene offered: No Sitter present: no Law enforcement present: Yes  

## 2014-08-09 NOTE — ED Notes (Signed)
BEHAVIORAL HEALTH ROUNDING Patient sleeping: Yes.   Patient alert and oriented: not applicable Behavior appropriate: Yes.  ; If no, describe:  Nutrition and fluids offered: Yes  Toileting and hygiene offered: Yes  Sitter present: no Law enforcement present: Yes  

## 2014-08-09 NOTE — ED Provider Notes (Addendum)
[facility]  I accepted care from Dr. Dahlia Client ____________________________________________    LABS (pertinent positives/negatives)  Positive for tricyclic, cannabinoids, cocaine, and benzodiazepines Urinalysis negative Tylenol level negative CBC within normal limits Metabolic panel significant for sodium of 146 Alcohol level 163  ____________________________________________    RADIOLOGY All xrays were viewed by me. Imaging interpreted by radiologist.  None  ____________________________________________   PROCEDURES  Procedure(s) performed: None  Critical Care performed: None  ____________________________________________   INITIAL IMPRESSION / ASSESSMENT AND PLAN / ED COURSE  CONSULTATIONS: Discussed face-to-face with behavioral health social worker Calvin after his intake assessment  Pertinent labs & imaging results that were available during my care of the patient were reviewed by me and considered in my medical decision making (see chart for details).  Patient was sedated overnight due to agitation while intoxicated. This morning after intake evaluation with our psychiatry social worker, the patient has no indication for emergency psychiatric commitment or emergency psychiatric evaluation and patient is not interested in substance abuse help. He will be discharged with referrals for outpatient resources. The patient denies wanting help with substance abuse, and is not depressed, psychotic, or having any acute homicidal or suicidal ideations.  Patient does state that he follows up at [facility] for behavioral health and he will make an appointment there.  Patient / Family / Caregiver informed of clinical course, medical decision-making process, and agree with plan.   I discussed return precautions, follow-up instructions, and discharged instructions with patient and/or family.  ____________________________________________   FINAL CLINICAL  IMPRESSION(S) / ED DIAGNOSES  Final diagnoses:  Alcohol intoxication, uncomplicated  Agitation states as acute reaction to exceptional (gross) stress     FOLLOW UP   Referred to: Primary care and [facility]   Lisa Roca, MD 08/09/14 1206  Lisa Roca, MD 08/09/14 1208

## 2014-08-09 NOTE — BHH Counselor (Signed)
Patient heavily sedated; unable to assess.

## 2014-08-09 NOTE — ED Notes (Signed)

## 2014-08-09 NOTE — ED Notes (Signed)

## 2014-08-09 NOTE — ED Notes (Signed)
ED tech in sight of patients room. Pt is on the monitored to watch vital signs which are normal at this time. Pt is still sleeping in the bed no distress breathing even and unlabored.

## 2014-08-09 NOTE — Discharge Instructions (Signed)
You were evaluated for alcohol intoxication overnight. Return to emergency department for any new or worsening condition including any altered mental status, seizure, depression, or any thoughts of wanting to hurt herself or others. I recommend you follow-up with [facility] for substance abuse.   Alcohol Intoxication Alcohol intoxication occurs when you drink enough alcohol that it affects your ability to function. It can be mild or very severe. Drinking a lot of alcohol in a short time is called binge drinking. This can be very harmful. Drinking alcohol can also be more dangerous if you are taking medicines or other drugs. Some of the effects caused by alcohol may include:  Loss of coordination.  Changes in mood and behavior.  Unclear thinking.  Trouble talking (slurred speech).  Throwing up (vomiting).  Confusion.  Slowed breathing.  Twitching and shaking (seizures).  Loss of consciousness. HOME CARE  Do not drive after drinking alcohol.  Drink enough water and fluids to keep your pee (urine) clear or pale yellow. Avoid caffeine.  Only take medicine as told by your doctor. GET HELP IF:  You throw up (vomit) many times.  You do not feel better after a few days.  You frequently have alcohol intoxication. Your doctor can help decide if you should see a substance use treatment counselor. GET HELP RIGHT AWAY IF:  You become shaky when you stop drinking.  You have twitching and shaking.  You throw up blood. It may look bright red or like coffee grounds.  You notice blood in your poop (bowel movements).  You become lightheaded or pass out (faint). MAKE SURE YOU:   Understand these instructions.  Will watch your condition.  Will get help right away if you are not doing well or get worse. Document Released: 07/15/2007 Document Revised: 09/28/2012 Document Reviewed: 07/01/2012 [facility] Patient Information 2015 [place], [place]. This information is not intended to replace  advice given to you by your health care provider. Make sure you discuss any questions you have with your health care provider.

## 2014-09-28 ENCOUNTER — Emergency Department: Payer: No Typology Code available for payment source

## 2014-09-28 ENCOUNTER — Emergency Department
Admission: EM | Admit: 2014-09-28 | Discharge: 2014-09-29 | Disposition: A | Discharge status: Home / Self Care | Payer: No Typology Code available for payment source | Attending: Emergency Medicine | Admitting: Emergency Medicine

## 2014-09-28 ENCOUNTER — Encounter: Payer: Self-pay | Admitting: Emergency Medicine

## 2014-09-28 DIAGNOSIS — I10 Essential (primary) hypertension: Secondary | ICD-10-CM | POA: Insufficient documentation

## 2014-09-28 DIAGNOSIS — Z720 Tobacco use: Secondary | ICD-10-CM | POA: Insufficient documentation

## 2014-09-28 DIAGNOSIS — Y998 Other external cause status: Secondary | ICD-10-CM | POA: Diagnosis not present

## 2014-09-28 DIAGNOSIS — Y9389 Activity, other specified: Secondary | ICD-10-CM | POA: Diagnosis not present

## 2014-09-28 DIAGNOSIS — S5002XA Contusion of left elbow, initial encounter: Secondary | ICD-10-CM | POA: Diagnosis not present

## 2014-09-28 DIAGNOSIS — Z79899 Other long term (current) drug therapy: Secondary | ICD-10-CM | POA: Insufficient documentation

## 2014-09-28 DIAGNOSIS — Y92410 Unspecified street and highway as the place of occurrence of the external cause: Secondary | ICD-10-CM | POA: Diagnosis not present

## 2014-09-28 DIAGNOSIS — S199XXA Unspecified injury of neck, initial encounter: Secondary | ICD-10-CM | POA: Diagnosis present

## 2014-09-28 DIAGNOSIS — S22070A Wedge compression fracture of T9-T10 vertebra, initial encounter for closed fracture: Secondary | ICD-10-CM | POA: Insufficient documentation

## 2014-09-28 DIAGNOSIS — S161XXA Strain of muscle, fascia and tendon at neck level, initial encounter: Secondary | ICD-10-CM | POA: Diagnosis not present

## 2014-09-28 MED ORDER — HYDROCODONE-ACETAMINOPHEN 5-325 MG PO TABS
1.0000 | ORAL_TABLET | ORAL | Status: AC
  Administered 2014-09-28: 1 via ORAL
  Filled 2014-09-28: qty 1
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
## 2014-09-29 MED ORDER — HYDROCODONE-ACETAMINOPHEN 5-325 MG PO TABS: 1.0000 | ORAL_TABLET | Freq: Four times a day (QID) | ORAL | Status: DC | PRN

## 2014-09-29 NOTE — Discharge Instructions (Signed)
Back, Compression Fracture A compression fracture happens when a force is put upon the length of your spine. Slipping and falling on your bottom are examples of such a force. When this happens, sometimes the force is great enough to compress the building blocks (vertebral bodies) of your spine. Although this causes a lot of pain, this can usually be treated at home, unless your caregiver feels hospitalization is needed for pain control. Your backbone (spinal column) is made up of 24 main vertebral bodies in addition to the sacrum and coccyx (see illustration). These are held together by tough fibrous tissues (ligaments) and by support of your muscles. Nerve roots pass through the openings between the vertebrae. A sudden wrenching move, injury, or a fall may cause a compression fracture of one of the vertebral bodies. This may result in back pain or spread of pain into the belly (abdomen), the buttocks, and down the leg into the foot. Pain may also be created by muscle spasm alone. Large studies have been undertaken to determine the best possible course of action to help your back following injury and also to prevent future problems. The recommendations are as follows. FOLLOWING A COMPRESSION FRACTURE: Do the following only if advised by your caregiver.   If a back brace has been suggested or provided, wear it as directed.  Do not stop wearing the back brace unless instructed by your caregiver.  When allowed to return to regular activities, avoid a sedentary lifestyle. Actively exercise. Sporadic weekend binges of tennis, racquetball, or waterskiing may actually aggravate or create problems, especially if you are not in condition for that activity.  Avoid sports requiring sudden body movements until you are in condition for them. Swimming and walking are safer activities.  Maintain good posture.  Avoid obesity.  If not already done, you should have a DEXA scan. Based on the results, be treated for  osteoporosis. FOLLOWING ACUTE (SUDDEN) INJURY:  Only take over-the-counter or prescription medicines for pain, discomfort, or fever as directed by your caregiver.  Use bed rest for only the most extreme acute episode. Prolonged bed rest may aggravate your condition. Ice used for acute conditions is effective. Use a large plastic bag filled with ice. Wrap it in a towel. This also provides excellent pain relief. This may be continuous. Or use it for 30 minutes every 2 hours during acute phase, then as needed. Heat for 30 minutes prior to activities is helpful.  As soon as the acute phase (the time when your back is too painful for you to do normal activities) is over, it is important to resume normal activities and work [employer] information [employer] manager. Back injuries can cause potentially marked changes in lifestyle. So it is important to attack these problems aggressively.  See your caregiver for continued problems. He or she can help or refer you for appropriate exercises, physical therapy, and work hardening if needed.  If you are given narcotic medications for your condition, for the next 24 hours do not:  Drive.  Operate machinery or power tools.  Sign legal documents.  Do not drink alcohol, or take sleeping pills or other medications that may interfere with treatment. If your caregiver has given you a follow-up appointment, it is very important to keep that appointment. Not keeping the appointment could result in a chronic or permanent injury, pain, and disability. If there is any problem keeping the appointment, you must call back to this facility for assistance.  SEEK IMMEDIATE MEDICAL CARE IF:  You develop numbness,  tingling, weakness, or problems with the use of your arms or legs.  You develop severe back pain not relieved with medications.  You have changes in bowel or bladder control.  You have increasing pain in any areas of the body. Document Released: 01/26/2005 Document Revised:  06/12/2013 Document Reviewed: 08/31/2007 [facility] Patient Information 2015 [place], [place]. This information is not intended to replace advice given to you by your health care provider. Make sure you discuss any questions you have with your health care provider.  Cervical Sprain A cervical sprain is when the tissues (ligaments) that hold the neck bones in place stretch or tear. HOME CARE   Put ice on the injured area.  Put ice in a plastic bag.  Place a towel between your skin and the bag.  Leave the ice on for 15-20 minutes, 3-4 times a day.  You may have been given a collar to wear. This collar keeps your neck from moving while you heal.  Do not take the collar off unless told by your doctor.  If you have long hair, keep it outside of the collar.  Ask your doctor before changing the position of your collar. You may need to change its position over time to make it more comfortable.  If you are allowed to take off the collar for cleaning or bathing, follow your doctor's instructions on how to do it safely.  Keep your collar clean by wiping it with mild soap and water. Dry it completely. If the collar has removable pads, remove them every 1-2 days to hand wash them with soap and water. Allow them to air dry. They should be dry before you wear them in the collar.  Do not drive while wearing the collar.  Only take medicine as told by your doctor.  Keep all doctor visits as told.  Keep all physical therapy visits as told.  Adjust your work station so that you have good posture while you work.  Avoid positions and activities that make your problems worse.  Warm up and stretch before being active. GET HELP IF:  Your pain is not controlled with medicine.  You cannot take less pain medicine over time as planned.  Your activity level does not improve as expected. GET HELP RIGHT AWAY IF:   You are bleeding.  Your stomach is upset.  You have an allergic reaction to your  medicine.  You develop new problems that you cannot explain.  You lose feeling (become numb) or you cannot move any part of your body (paralysis).  You have tingling or weakness in any part of your body.  Your symptoms get worse. Symptoms include:  Pain, soreness, stiffness, puffiness (swelling), or a burning feeling in your neck.  Pain when your neck is touched.  Shoulder or upper back pain.  Limited ability to move your neck.  Headache.  Dizziness.  Your hands or arms feel week, lose feeling, or tingle.  Muscle spasms.  Difficulty swallowing or chewing. MAKE SURE YOU:   Understand these instructions.  Will watch your condition.  Will get help right away if you are not doing well or get worse. Document Released: 07/15/2007 Document Revised: 09/28/2012 Document Reviewed: 08/03/2012 [facility] Patient Information 2015 [place], [place]. This information is not intended to replace advice given to you by your health care provider. Make sure you discuss any questions you have with your health care provider.  Blunt Trauma You have been evaluated for injuries. You have been examined and your caregiver has  not found injuries serious enough to require hospitalization. It is common to have multiple bruises and sore muscles following an accident. These tend to feel worse for the first 24 hours. You will feel more stiffness and soreness over the next several hours and worse when you wake up the first morning after your accident. After this point, you should begin to improve with each passing day. The amount of improvement depends on the amount of damage done in the accident. Following your accident, if some part of your body does not work as it should, or if the pain in any area continues to increase, you should return to the Emergency Department for re-evaluation.  HOME CARE INSTRUCTIONS  Routine care for sore areas should include:  Ice to sore areas every 2 hours for 20 minutes while  awake for the next 2 days.  Drink extra fluids (not alcohol).  Take a hot or warm shower or bath once or twice a day to increase blood flow to sore muscles. This will help you "limber up".  Activity as tolerated. Lifting may aggravate neck or back pain.  Only take over-the-counter or prescription medicines for pain, discomfort, or fever as directed by your caregiver. Do not use aspirin. This may increase bruising or increase bleeding if there are small areas where this is happening. SEEK IMMEDIATE MEDICAL CARE IF:  Numbness, tingling, weakness, or problem with the use of your arms or legs.  A severe headache is not relieved with medications.  There is a change in bowel or bladder control.  Increasing pain in any areas of the body.  Short of breath or dizzy.  Nauseated, vomiting, or sweating.  Increasing belly (abdominal) discomfort.  Blood in urine, stool, or vomiting blood.  Pain in either shoulder in an area where a shoulder strap would be.  Feelings of lightheadedness or if you have a fainting episode. Sometimes it is not possible to identify all injuries immediately after the trauma. It is important that you continue to monitor your condition after the emergency department visit. If you feel you are not improving, or improving more slowly than should be expected, call your physician. If you feel your symptoms (problems) are worsening, return to the Emergency Department immediately. Document Released: 10/22/2000 Document Revised: 04/20/2011 Document Reviewed: 09/14/2007 [facility] Patient Information 2015 [place], [place]. This information is not intended to replace advice given to you by your health care provider. Make sure you discuss any questions you have with your health care provider.  Contusion A contusion is a deep bruise. Contusions are the result of an injury that caused bleeding under the skin. The contusion may turn blue, purple, or yellow. Minor injuries will give you  a painless contusion, but more severe contusions may stay painful and swollen for a few weeks.  CAUSES  A contusion is usually caused by a blow, trauma, or direct force to an area of the body. SYMPTOMS   Swelling and redness of the injured area.  Bruising of the injured area.  Tenderness and soreness of the injured area.  Pain. DIAGNOSIS  The diagnosis can be made by taking a history and physical exam. An X-ray, CT scan, or MRI may be needed to determine if there were any associated injuries, such as fractures. TREATMENT  Specific treatment will depend on what area of the body was injured. In general, the best treatment for a contusion is resting, icing, elevating, and applying cold compresses to the injured area. Over-the-counter medicines may also be recommended for  pain control. Ask your caregiver what the best treatment is for your contusion. HOME CARE INSTRUCTIONS   Put ice on the injured area.  Put ice in a plastic bag.  Place a towel between your skin and the bag.  Leave the ice on for 15-20 minutes, 3-4 times a day, or as directed by your health care provider.  Only take over-the-counter or prescription medicines for pain, discomfort, or fever as directed by your caregiver. Your caregiver may recommend avoiding anti-inflammatory medicines (aspirin, ibuprofen, and naproxen) for 48 hours because these medicines may increase bruising.  Rest the injured area.  If possible, elevate the injured area to reduce swelling. SEEK IMMEDIATE MEDICAL CARE IF:   You have increased bruising or swelling.  You have pain that is getting worse.  Your swelling or pain is not relieved with medicines. MAKE SURE YOU:   Understand these instructions.  Will watch your condition.  Will get help right away if you are not doing well or get worse. Document Released: 11/05/2004 Document Revised: 01/31/2013 Document Reviewed: 12/01/2010 [facility] Patient Information 2015 [place], [place]. This  information is not intended to replace advice given to you by your health care provider. Make sure you discuss any questions you have with your health care provider.  Cryotherapy Cryotherapy is when you put ice on your injury. Ice helps lessen pain and puffiness (swelling) after an injury. Ice works the best when you start using it in the first 24 to 48 hours after an injury. HOME CARE  Put a dry or damp towel between the ice pack and your skin.  You may press gently on the ice pack.  Leave the ice on for no more than 10 to 20 minutes at a time.  Check your skin after 5 minutes to make sure your skin is okay.  Rest at least 20 minutes between ice pack uses.  Stop using ice when your skin loses feeling (numbness).  Do not use ice on someone who cannot tell you when it hurts. This includes small children and people with memory problems (dementia). GET HELP RIGHT AWAY IF:  You have white spots on your skin.  Your skin turns blue or pale.  Your skin feels waxy or hard.  Your puffiness gets worse. MAKE SURE YOU:   Understand these instructions.  Will watch your condition.  Will get help right away if you are not doing well or get worse. Document Released: 07/15/2007 Document Revised: 04/20/2011 Document Reviewed: 09/18/2010 [facility] Patient Information 2015 [place], [place]. This information is not intended to replace advice given to you by your health care provider. Make sure you discuss any questions you have with your health care provider.
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
## 2014-10-23 ENCOUNTER — Emergency Department
Admission: EM | Admit: 2014-10-23 | Discharge: 2014-10-23 | Disposition: A | Discharge status: Home / Self Care | Payer: Self-pay | Attending: Emergency Medicine | Admitting: Emergency Medicine

## 2014-10-23 DIAGNOSIS — Z720 Tobacco use: Secondary | ICD-10-CM | POA: Insufficient documentation

## 2014-10-23 DIAGNOSIS — M549 Dorsalgia, unspecified: Secondary | ICD-10-CM | POA: Insufficient documentation

## 2014-10-23 DIAGNOSIS — G8929 Other chronic pain: Secondary | ICD-10-CM | POA: Insufficient documentation

## 2014-10-23 DIAGNOSIS — I10 Essential (primary) hypertension: Secondary | ICD-10-CM | POA: Insufficient documentation

## 2014-10-23 MED ORDER — KETOROLAC TROMETHAMINE 60 MG/2ML IM SOLN
60.0000 mg | Freq: Once | INTRAMUSCULAR | Status: AC
  Administered 2014-10-23: 60 mg via INTRAMUSCULAR
  Filled 2014-10-23: qty 2

## 2014-10-23 NOTE — ED Notes (Signed)
Pt walked out without receiving D/C instructions, attempted to call, no answer.
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
## 2014-10-23 NOTE — Discharge Instructions (Signed)
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
## 2014-10-29 ENCOUNTER — Emergency Department
Admission: EM | Admit: 2014-10-29 | Discharge: 2014-10-29 | Disposition: A | Discharge status: Home / Self Care | Payer: Self-pay | Attending: Emergency Medicine | Admitting: Emergency Medicine

## 2014-10-29 ENCOUNTER — Encounter: Payer: Self-pay | Admitting: Emergency Medicine

## 2014-10-29 DIAGNOSIS — K649 Unspecified hemorrhoids: Secondary | ICD-10-CM | POA: Insufficient documentation

## 2014-10-29 DIAGNOSIS — Z79899 Other long term (current) drug therapy: Secondary | ICD-10-CM | POA: Insufficient documentation

## 2014-10-29 DIAGNOSIS — I10 Essential (primary) hypertension: Secondary | ICD-10-CM | POA: Insufficient documentation

## 2014-10-29 DIAGNOSIS — R21 Rash and other nonspecific skin eruption: Secondary | ICD-10-CM

## 2014-10-29 DIAGNOSIS — Z720 Tobacco use: Secondary | ICD-10-CM | POA: Insufficient documentation

## 2014-10-29 NOTE — Discharge Instructions (Signed)

## 2014-10-29 NOTE — ED Notes (Signed)
Pt assessed by provider prior to this RN. Pt is awake and alert, speaking in complete, coherent sentences without difficulty. Pt ambulatory without difficulty in room. No acute distress noted. See provider tsheet for assessment details.

## 2014-10-29 NOTE — ED Provider Notes (Signed)
[facility] Emergency Department Provider Note  ____________________________________________  Time seen: On arrival  I have reviewed the triage vital signs and the nursing notes.   HISTORY  Chief Complaint Rash    HPI Alvin Gonzalez is a 54 y.o. male who presents with complaints of possible insect bites and hemorrhoids. Patient complains that he developed small pustules which were itchy and he scratched them and he wants to make sure that he doesn't have any diseases. He also notes a history of hemorrhoids and feels that they are back. He denies blood in the stool no tenderness to palpation    Past Medical History  Diagnosis Date  . Hypertension   . Tobacco abuse   . Depression   . Suicide attempt     prior to 02/18/14 admission   . Chronic back pain   . Intermittent explosive disorder   . Anxiety   . Depression     Patient Active Problem List   Diagnosis Date Noted  . Overdose 02/18/2014  . Hypokalemia 02/18/2014  . Suicide attempt 02/18/2014  . Respiratory failure 02/18/2014  . Hypotension 02/18/2014  . Tachycardia 02/18/2014  . AKI (acute kidney injury) 02/18/2014  . Leukocytosis 02/18/2014  . Acute encephalopathy 02/18/2014    Past Surgical History  Procedure Laterality Date  . Lymph node biopsy      Current Outpatient Rx  Name  Route  Sig  Dispense  Refill  . amitriptyline (ELAVIL) 25 MG tablet   Oral   Take 75 mg by mouth every evening.         . diclofenac sodium (VOLTAREN) 1 % GEL   Topical   Apply 2 g topically 4 (four) times daily as needed (for pain).         [name] FLUoxetine (PROZAC) 20 MG capsule   Oral   Take 20 mg by mouth every morning.         . hydrochlorothiazide (HYDRODIURIL) 50 MG tablet   Oral   Take 50 mg by mouth daily.         [name] HYDROcodone-acetaminophen (NORCO) 5-325 MG per tablet   Oral   Take 1-2 tablets by mouth every 6 (six) hours as needed for moderate pain. MAXIMUM TOTAL ACETAMINOPHEN  DOSE IS 4000 MG PER DAY   20 tablet   0   . lisinopril (PRINIVIL,ZESTRIL) 20 MG tablet   Oral   Take 20 mg by mouth daily.         . naproxen sodium (ALEVE) 220 MG tablet   Oral   Take 440 mg by mouth 2 (two) times daily as needed (pain).         [name] oxyCODONE-acetaminophen (PERCOCET/ROXICET) 5-325 MG per tablet   Oral   Take 1 tablet by mouth every 8 (eight) hours as needed for moderate pain or severe pain.   11 tablet   0   . risperiDONE (RISPERDAL) 2 MG tablet   Oral   Take 2 mg by mouth every evening.           Allergies Ibuprofen and Tegretol  History reviewed. No pertinent family history.  Social History Social History  Substance Use Topics  . Smoking status: Current Every Day Smoker -- 1.00 packs/day    Types: Cigarettes  . Smokeless tobacco: None  . Alcohol Use: No    Review of Systems  Constitutional: Negative for fever. Eyes: Negative for visual changes. ENT: Negative for sore throat GI: Positive for hemorrhoids Musculoskeletal: Negative for back  pain. Skin: Negative for rash. 3 areas consistent with insect bites Neurological: Negative for headaches or focal weakness   ____________________________________________   PHYSICAL EXAM:  VITAL SIGNS: ED Triage Vitals  Enc Vitals Group     BP 10/29/14 0955 129/83 mmHg     Pulse Rate 10/29/14 0955 82     Resp 10/29/14 0955 20     Temp 10/29/14 0955 98.2 F (36.8 C)     Temp Source 10/29/14 0955 Oral     SpO2 10/29/14 0955 100 %     Weight 10/29/14 0955 150 lb (68.04 kg)     Height 10/29/14 0955 5\' 3"  (1.6 m)     Head Cir --      Peak Flow --      Pain Score 10/29/14 0955 7     Pain Loc --      Pain Edu? --      Excl. in [place]? --      Constitutional: Alert and oriented. Well appearing and in no distress. Eyes: Conjunctivae are normal.  ENT   Head: Normocephalic and atraumatic.   Mouth/Throat: Mucous membranes are moist. Cardiovascular: Normal rate, regular rhythm.  Respiratory:  Normal respiratory effort without tachypnea nor retractions.  Gastrointestinal: Soft and non-tender in all quadrants. No distention. There is no CVA tenderness. Patient has 2 external hemorrhoids noted. Nontender nonbleeding Musculoskeletal: Nontender with normal range of motion in all extremities. Neurologic:  Normal speech and language. No gross focal neurologic deficits are appreciated. Skin:  Skin is warm, dry and intact. No rash noted. Patient does have an area on his back which looks like a healing insect bite that was scratched and the same on his right lower leg. No evidence of infection. No intraoral rash Psychiatric: Mood and affect are normal. Patient exhibits appropriate insight and judgment.  ____________________________________________    LABS (pertinent positives/negatives)  Labs Reviewed - No data to display  ____________________________________________     ____________________________________________    RADIOLOGY I have personally reviewed any xrays that were ordered on this patient: None  ____________________________________________   PROCEDURES  Procedure(s) performed: none   ____________________________________________   INITIAL IMPRESSION / ASSESSMENT AND PLAN / ED COURSE  Pertinent labs & imaging results that were available during my care of the patient were reviewed by me and considered in my medical decision making (see chart for details).  Patient well-appearing and in no distress. Appears like insect bites that are healing well. No evidence of cellulitis. She does have hemorrhoids but these are not causing any symptoms at this time.  ____________________________________________   FINAL CLINICAL IMPRESSION(S) / ED DIAGNOSES  Final diagnoses:  Hemorrhoids, unspecified hemorrhoid type  Rash     Lavonia Drafts, MD 10/29/14 1147

## 2014-10-29 NOTE — ED Notes (Signed)
Pt to ed with c/o blisters over body, also reports pain and blisters on rectum.

## 2014-11-13 ENCOUNTER — Ambulatory Visit: Payer: Self-pay

## 2014-11-19 ENCOUNTER — Encounter: Payer: Self-pay | Admitting: Pharmacist

## 2015-01-05 ENCOUNTER — Emergency Department
Admission: EM | Admit: 2015-01-05 | Discharge: 2015-01-05 | Disposition: A | Discharge status: Home / Self Care | Payer: Self-pay | Attending: Emergency Medicine | Admitting: Emergency Medicine

## 2015-01-05 ENCOUNTER — Encounter: Payer: Self-pay | Admitting: Medical Oncology

## 2015-01-05 DIAGNOSIS — I10 Essential (primary) hypertension: Secondary | ICD-10-CM | POA: Insufficient documentation

## 2015-01-05 DIAGNOSIS — Z79899 Other long term (current) drug therapy: Secondary | ICD-10-CM | POA: Insufficient documentation

## 2015-01-05 DIAGNOSIS — M62830 Muscle spasm of back: Secondary | ICD-10-CM | POA: Insufficient documentation

## 2015-01-05 DIAGNOSIS — S22000D Wedge compression fracture of unspecified thoracic vertebra, subsequent encounter for fracture with routine healing: Secondary | ICD-10-CM | POA: Insufficient documentation

## 2015-01-05 DIAGNOSIS — F17210 Nicotine dependence, cigarettes, uncomplicated: Secondary | ICD-10-CM | POA: Insufficient documentation

## 2015-01-05 MED ORDER — HYDROCODONE-ACETAMINOPHEN 5-325 MG PO TABS: 1.0000 | ORAL_TABLET | ORAL | Status: DC | PRN

## 2015-01-05 MED ORDER — CYCLOBENZAPRINE HCL 10 MG PO TABS: 10.0000 mg | ORAL_TABLET | Freq: Three times a day (TID) | ORAL | Status: DC | PRN

## 2015-01-05 MED ORDER — MELOXICAM 15 MG PO TABS: 15.0000 mg | ORAL_TABLET | Freq: Every day | ORAL | Status: DC

## 2015-01-05 NOTE — ED Notes (Signed)
Pt reports hx of back pain- began yesterday having worsening pain to mid back.
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
## 2015-01-05 NOTE — Discharge Instructions (Signed)
Spinal Compression Fracture °A spinal compression fracture is a collapse of the bones that form the spine (vertebrae). With this type of fracture, the vertebrae become squashed (compressed) into a wedge shape. Most compression fractures happen in the middle or lower part of the spine. °CAUSES °This condition may be caused by: °· Thinning and loss of density in the bones (osteoporosis). This is the most common cause. °· A fall. °· A car or motorcycle accident. °· Cancer. °· Trauma, such as a heavy, direct hit to the head. °RISK FACTORS °You may be at greater risk for a spinal compression fracture if you: °· Are 50 years old or older. °· Have osteoporosis. °· Have certain types of cancer, including: °¨ Multiple myeloma. °¨ Lymphoma. °¨ Prostate cancer. °¨ Lung cancer. °¨ Breast cancer. °SYMPTOMS °Symptoms of this condition include: °· Severe pain. °· Pain that gets worse over time. °· Pain that is worse when you stand, walk, sit, or bend. °· Sudden pain that is so bad that it is hard for you to move. °· Bending or humping of the spine. °· Gradual loss of height. °· Numbness, tingling, or weakness in the back and legs. °· Trouble walking. °Your symptoms will depend on the cause of the fracture and how quickly it develops. For example, fractures that are caused by osteoporosis can cause few symptoms, no symptoms, or symptoms that develop slowly over time. °DIAGNOSIS °This condition may be diagnosed based on symptoms, medical history, and a physical exam. During the physical exam, your health care provider may tap along the length of your spine to check for tenderness. Tests may be done to confirm the diagnosis. They may include: °· A bone density test to check for osteoporosis. °· Imaging tests, such as a spine X-ray, a CT scan, or MRI. °TREATMENT °Treatment for this condition depends on the cause and severity of the condition. Some fractures, such as those that are caused by osteoporosis, may heal on their own with  supportive care. This may include: °· Pain medicine. °· Rest. °· A back brace. °· Physical therapy exercises. °· Medicine that reduces bone pain. °· Calcium and vitamin D supplements. °Fractures that cause the back to become misshapen, cause nerve pain or weakness, or do not respond to other treatment may be treated with a surgical procedure, such as: °· Vertebroplasty. In this procedure, bone cement is injected into the collapsed vertebrae to stabilize them. °· Balloon kyphoplasty. In this procedure, the collapsed vertebrae are expanded with a balloon and then bone cement is injected into them. °· Spinal fusion. In this procedure, the collapsed vertebrae are connected (fused) to normal vertebrae. °HOME CARE INSTRUCTIONS °General Instructions °· Take medicines only as directed by your health care provider. °· Do not drive or operate heavy machinery while taking pain medicine. °· If directed, apply ice to the injured area: °¨ Put ice in a plastic bag. °¨ Place a towel between your skin and the bag. °¨ Leave the ice on for 30 minutes every two hours at first. Then apply the ice as needed. °· Wear your neck brace or back brace as directed by your health care provider. °· Do not drink alcohol. Alcohol can interfere with your treatment. °· Keep all follow-up visits as directed by your health care provider. This is important. It can help to prevent permanent injury, disability, and long-lasting (chronic) pain. °Activity °· Stay in bed (on bed rest) only as directed by your health care provider. Being on bed rest for too long can   make your condition worse. °· Return to your normal activities as directed by your health care provider. Ask what activities are safe for you. °· Do exercises to improve motion and strength in your back (physical therapy), as recommended by your health care provider. °· Exercise regularly as directed by your health care provider. °SEEK MEDICAL CARE IF: °· You have a fever. °· You develop a cough  that makes your pain worse. °· Your pain medicine is not helping. °· Your pain does not get better over time. °· You cannot return to your normal activities as planned or expected. °SEEK IMMEDIATE MEDICAL CARE IF: °· Your pain is very bad and it suddenly gets worse. °· You are unable to move any body part (paralysis) that is below the level of your injury. °· You have numbness, tingling, or weakness in any body part that is below the level of your injury. °· You cannot control your bladder or bowels. °  °This information is not intended to replace advice given to you by your health care provider. Make sure you discuss any questions you have with your health care provider. °  °Document Released: 01/26/2005 Document Revised: 06/12/2014 Document Reviewed: 01/30/2014 °[employer] Interactive Patient Education ©2016 [employer] ° °

## 2015-01-08 ENCOUNTER — Emergency Department
Admission: EM | Admit: 2015-01-08 | Discharge: 2015-01-08 | Disposition: A | Discharge status: Home / Self Care | Payer: Self-pay | Attending: Emergency Medicine | Admitting: Emergency Medicine

## 2015-01-08 ENCOUNTER — Encounter: Payer: Self-pay | Admitting: Emergency Medicine

## 2015-01-08 DIAGNOSIS — Z791 Long term (current) use of non-steroidal anti-inflammatories (NSAID): Secondary | ICD-10-CM | POA: Insufficient documentation

## 2015-01-08 DIAGNOSIS — Z765 Malingerer [conscious simulation]: Secondary | ICD-10-CM

## 2015-01-08 DIAGNOSIS — Z7289 Other problems related to lifestyle: Secondary | ICD-10-CM | POA: Insufficient documentation

## 2015-01-08 DIAGNOSIS — I10 Essential (primary) hypertension: Secondary | ICD-10-CM | POA: Insufficient documentation

## 2015-01-08 DIAGNOSIS — M4854XD Collapsed vertebra, not elsewhere classified, thoracic region, subsequent encounter for fracture with routine healing: Secondary | ICD-10-CM

## 2015-01-08 DIAGNOSIS — M549 Dorsalgia, unspecified: Secondary | ICD-10-CM

## 2015-01-08 DIAGNOSIS — F17210 Nicotine dependence, cigarettes, uncomplicated: Secondary | ICD-10-CM | POA: Insufficient documentation

## 2015-01-08 DIAGNOSIS — Z79899 Other long term (current) drug therapy: Secondary | ICD-10-CM | POA: Insufficient documentation

## 2015-01-08 DIAGNOSIS — M4845XD Fatigue fracture of vertebra, thoracolumbar region, subsequent encounter for fracture with routine healing: Secondary | ICD-10-CM | POA: Insufficient documentation

## 2015-01-08 DIAGNOSIS — G8929 Other chronic pain: Secondary | ICD-10-CM | POA: Insufficient documentation

## 2015-01-08 MED ORDER — KETOROLAC TROMETHAMINE 30 MG/ML IJ SOLN
60.0000 mg | Freq: Once | INTRAMUSCULAR | Status: AC
  Administered 2015-01-08: 60 mg via INTRAMUSCULAR
  Filled 2015-01-08: qty 2

## 2015-01-08 NOTE — ED Provider Notes (Signed)
[facility] Emergency Department Provider Note  ____________________________________________  Time seen: Approximately 1:14 PM  I have reviewed the triage vital signs and the nursing notes.   HISTORY  Chief Complaint Back Pain    HPI Alvin Gonzalez is a 54 y.o. male who returns to the emergency department complaining of lower back pain. Patient was seen by myself 3 days prior for same complaint. The patient is now wearing a prescribed back brace from Dr. Roland Rack. Patient is still endorsing severe lower back pain. No change in symptoms from baseline. 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 Pain is sharp, constant, described as severe.   Past Medical History  Diagnosis Date  . Hypertension   . Tobacco abuse   . Depression   . Suicide attempt ([place])     prior to 02/18/14 admission   . Chronic back pain   . Intermittent explosive disorder   . Anxiety   . Depression     Patient Active Problem List   Diagnosis Date Noted  . Overdose 02/18/2014  . Hypokalemia 02/18/2014  . Suicide attempt ([place]) 02/18/2014  . Respiratory failure ([place]) 02/18/2014  . Hypotension 02/18/2014  . Tachycardia 02/18/2014  . AKI (acute kidney injury) ([place]) 02/18/2014  . Leukocytosis 02/18/2014  . Acute encephalopathy 02/18/2014    Past Surgical History  Procedure Laterality Date  . Lymph node biopsy      Current Outpatient Rx  Name  Route  Sig  Dispense  Refill  . amitriptyline (ELAVIL) 25 MG tablet   Oral   Take 75 mg by mouth every evening.         . cyclobenzaprine (FLEXERIL) 10 MG tablet   Oral   Take 1 tablet (10 mg total) by mouth 3 (three) times daily as needed for muscle spasms.   15 tablet   0   . diclofenac sodium (VOLTAREN) 1 % GEL   Topical   Apply 2 g topically 4 (four) times daily as needed (for  pain).         [name] FLUoxetine (PROZAC) 20 MG capsule   Oral   Take 20 mg by mouth every morning.         . hydrochlorothiazide (HYDRODIURIL) 50 MG tablet   Oral   Take 50 mg by mouth daily.         [name] HYDROcodone-acetaminophen (NORCO/VICODIN) 5-325 MG tablet   Oral   Take 1 tablet by mouth every 4 (four) hours as needed for moderate pain.   20 tablet   0   . lisinopril (PRINIVIL,ZESTRIL) 20 MG tablet   Oral   Take 20 mg by mouth daily.         . meloxicam (MOBIC) 15 MG tablet   Oral   Take 1 tablet (15 mg total) by mouth daily.   30 tablet   0   . naproxen sodium (ALEVE) 220 MG tablet   Oral   Take 440 mg by mouth 2 (two) times daily as needed (pain).         [name] oxyCODONE-acetaminophen (PERCOCET/ROXICET) 5-325 MG per tablet   Oral   Take 1 tablet by mouth every 8 (eight) hours as needed for moderate pain or severe pain.   11 tablet   0   . risperiDONE (RISPERDAL) 2 MG tablet   Oral   Take 2 mg  by mouth every evening.           Allergies Ibuprofen and Tegretol  No family history on file.  Social History Social History  Substance Use Topics  . Smoking status: Current Every Day Smoker -- 1.00 packs/day    Types: Cigarettes  . Smokeless tobacco: None  . Alcohol Use: No    Review of Systems Constitutional: No fever/chills Eyes: No visual changes. ENT: No sore throat. Cardiovascular: Denies chest pain. Respiratory: Denies shortness of breath. Gastrointestinal: No abdominal pain.  No nausea, no vomiting.  No diarrhea.  No constipation. Genitourinary: Negative for dysuria. Musculoskeletal: Endorses low back pain. Skin: Negative for rash. Neurological: Negative for headaches, focal weakness or numbness.  10-point ROS otherwise negative.  ____________________________________________   PHYSICAL EXAM:  VITAL SIGNS: ED Triage Vitals  Enc Vitals Group     BP 01/08/15 1238 116/76 mmHg     Pulse Rate 01/08/15 1238 99     Resp 01/08/15 1238 18      Temp 01/08/15 1238 97.4 F (36.3 C)     Temp Source 01/08/15 1238 Oral     SpO2 01/08/15 1238 97 %     Weight 01/08/15 1238 160 lb (72.576 kg)     Height 01/08/15 1238 5\' 5"  (1.651 m)     Head Cir --      Peak Flow --      Pain Score 01/08/15 1240 8     Pain Loc --      Pain Edu? --      Excl. in [place]? --     Constitutional: Alert and oriented. Well appearing and in no acute distress. Eyes: Conjunctivae are normal. PERRL. Pupils are constricted. EOMI. Head: Atraumatic. Nose: No congestion/rhinnorhea. Mouth/Throat: Mucous membranes are moist.  Oropharynx non-erythematous. Neck: No stridor.   Cardiovascular: Normal rate, regular rhythm. Grossly normal heart sounds.  Good peripheral circulation. Respiratory: Normal respiratory effort.  No retractions. Lungs CTAB. Gastrointestinal: Soft and nontender. No distention. No abdominal bruits. No CVA tenderness. Musculoskeletal: No lower extremity tenderness nor edema.  No joint effusions. Patient were no visible abnormality to spine upon inspection. Patient is diffusely tender to palpation over the lower thoracic spine and upper lumbar region. Negative straight leg raise bilaterally. Sensation and pulses intact distally. Neurologic:  Normal speech and language. No gross focal neurologic deficits are appreciated. No gait instability. Patient is neurologically intact with cranial nerves II through XII intact. However, patient is somewhat sluggish and following commands. Skin:  Skin is warm, dry and intact. No rash noted. Psychiatric: Mood and affect are normal. Speech and behavior are normal.  ____________________________________________   LABS (all labs ordered are listed, but only abnormal results are displayed)  Labs Reviewed - No data to display ____________________________________________  EKG   ____________________________________________  RADIOLOGY   ____________________________________________   PROCEDURES  Procedure(s)  performed: None  Critical Care performed: No  ____________________________________________   INITIAL IMPRESSION / ASSESSMENT AND PLAN / ED COURSE  Pertinent labs & imaging results that were available during my care of the patient were reviewed by me and considered in my medical decision making (see chart for details).  Patient's history, symptoms, physical exam were taken as consideration for diagnosis. I saw this patient 3 days prior for same complaint. Patient presents with history pupils, sluggishness, and scattered story. The patient shows no signs of cardiopulmonary compromise from narcotic use with vital signs being stable. Patient is neurologically intact, is somewhat sluggish and following commands. There is no concern at  this time for intracranial abnormality. The patient states that he is "out of pain medicine and needs something for this pain." The patient was queried in the [place] controlled substance database and it reveals that the patient has received 110 Vicodin and the last 7 days. The patient is reporting that he is out of medication at this time and Vicodin is "not strong enough." The patient's presentation is consistent with overuse of narcotics including his constricted pupils and scattered story. At this time I advised the patient to provide a Toradol injection for him but that at this point no narcotics would be given in the emergency department or prescribed for him for at home use. The patient verbalizes understanding of this. Advised patient that at this time his only option is to follow-up with his spine specialist appointment. The patient is advised to contact [facility] for charity care options. Patient verbalizes understanding of the above and verbalizes agreement with same. ____________________________________________   FINAL CLINICAL IMPRESSION(S) / ED DIAGNOSES  Final diagnoses:  Compression fracture of thoracic spine, non-traumatic, with routine healing,  subsequent encounter  Chronic back pain  Drug-seeking behavior      Darletta Moll, PA-C 01/08/15 1342  Orbie Pyo, MD 01/08/15 [phone]

## 2015-01-08 NOTE — ED Notes (Addendum)
C/O back pain.  Patient has history of chronic back pain.  Seen by PCP two weeks ago, prescribed vicodin, but pain not improving.  Has not taken any pain medication since Saturday, stating "pain medicine is not working for me and making my stomach bad".  Also reports that 3-4 months ago was in an accident and fractured T9 and T10.

## 2015-01-08 NOTE — ED Notes (Signed)
Has back brace from recent injury, has referral to [facility], wants [place] to fix him

## 2015-01-08 NOTE — Discharge Instructions (Signed)

## 2015-01-30 ENCOUNTER — Emergency Department: Payer: Medicaid Other

## 2015-01-30 ENCOUNTER — Emergency Department: Payer: Self-pay

## 2015-01-30 ENCOUNTER — Encounter: Payer: Self-pay | Admitting: *Deleted

## 2015-01-30 ENCOUNTER — Emergency Department
Admission: EM | Admit: 2015-01-30 | Discharge: 2015-01-30 | Disposition: A | Discharge status: Other Acute Inpatient Hospital | Payer: Self-pay | Attending: Student | Admitting: Student

## 2015-01-30 DIAGNOSIS — Y9289 Other specified places as the place of occurrence of the external cause: Secondary | ICD-10-CM | POA: Insufficient documentation

## 2015-01-30 DIAGNOSIS — S32028A Other fracture of second lumbar vertebra, initial encounter for closed fracture: Secondary | ICD-10-CM | POA: Insufficient documentation

## 2015-01-30 DIAGNOSIS — Y998 Other external cause status: Secondary | ICD-10-CM | POA: Insufficient documentation

## 2015-01-30 DIAGNOSIS — S32038A Other fracture of third lumbar vertebra, initial encounter for closed fracture: Secondary | ICD-10-CM | POA: Insufficient documentation

## 2015-01-30 DIAGNOSIS — T1490XA Injury, unspecified, initial encounter: Secondary | ICD-10-CM

## 2015-01-30 DIAGNOSIS — S32009A Unspecified fracture of unspecified lumbar vertebra, initial encounter for closed fracture: Secondary | ICD-10-CM

## 2015-01-30 DIAGNOSIS — S4992XA Unspecified injury of left shoulder and upper arm, initial encounter: Secondary | ICD-10-CM | POA: Insufficient documentation

## 2015-01-30 DIAGNOSIS — S22009A Unspecified fracture of unspecified thoracic vertebra, initial encounter for closed fracture: Secondary | ICD-10-CM

## 2015-01-30 DIAGNOSIS — W1789XA Other fall from one level to another, initial encounter: Secondary | ICD-10-CM | POA: Insufficient documentation

## 2015-01-30 DIAGNOSIS — I10 Essential (primary) hypertension: Secondary | ICD-10-CM | POA: Insufficient documentation

## 2015-01-30 DIAGNOSIS — S2241XA Multiple fractures of ribs, right side, initial encounter for closed fracture: Secondary | ICD-10-CM | POA: Insufficient documentation

## 2015-01-30 DIAGNOSIS — Z79899 Other long term (current) drug therapy: Secondary | ICD-10-CM | POA: Insufficient documentation

## 2015-01-30 DIAGNOSIS — F17210 Nicotine dependence, cigarettes, uncomplicated: Secondary | ICD-10-CM | POA: Insufficient documentation

## 2015-01-30 DIAGNOSIS — S22078A Other fracture of T9-T10 vertebra, initial encounter for closed fracture: Secondary | ICD-10-CM | POA: Insufficient documentation

## 2015-01-30 DIAGNOSIS — S2231XA Fracture of one rib, right side, initial encounter for closed fracture: Secondary | ICD-10-CM

## 2015-01-30 DIAGNOSIS — Y9389 Activity, other specified: Secondary | ICD-10-CM | POA: Insufficient documentation

## 2015-01-30 DIAGNOSIS — S3991XA Unspecified injury of abdomen, initial encounter: Secondary | ICD-10-CM | POA: Insufficient documentation

## 2015-01-30 DIAGNOSIS — S42211A Unspecified displaced fracture of surgical neck of right humerus, initial encounter for closed fracture: Secondary | ICD-10-CM | POA: Insufficient documentation

## 2015-01-30 DIAGNOSIS — Z791 Long term (current) use of non-steroidal anti-inflammatories (NSAID): Secondary | ICD-10-CM | POA: Insufficient documentation

## 2015-01-30 LAB — CBC WITH DIFFERENTIAL/PLATELET
Basophils Absolute: 0.1 10*3/uL (ref 0–0.1)
Basophils Relative: 1 %
EOS ABS: 0.2 10*3/uL (ref 0–0.7)
EOS PCT: 2 %
HCT: 43.2 % (ref 40.0–52.0)
Hemoglobin: 14.3 g/dL (ref 13.0–18.0)
LYMPHS ABS: 3.7 10*3/uL — AB (ref 1.0–3.6)
LYMPHS PCT: 27 %
MCH: 28.2 pg (ref 26.0–34.0)
MCHC: 33 g/dL (ref 32.0–36.0)
MCV: 85.3 fL (ref 80.0–100.0)
MONO ABS: 0.7 10*3/uL (ref 0.2–1.0)
Monocytes Relative: 5 %
Neutro Abs: 8.8 10*3/uL — ABNORMAL HIGH (ref 1.4–6.5)
Neutrophils Relative %: 65 %
PLATELETS: 416 10*3/uL (ref 150–440)
RBC: 5.06 MIL/uL (ref 4.40–5.90)
RDW: 14.3 % (ref 11.5–14.5)
WBC: 13.6 10*3/uL — ABNORMAL HIGH (ref 3.8–10.6)

## 2015-01-30 LAB — ABO/RH: ABO/RH(D): O POS

## 2015-01-30 LAB — COMPREHENSIVE METABOLIC PANEL
ALT: 269 U/L — AB (ref 17–63)
ANION GAP: 13 (ref 5–15)
AST: 301 U/L — ABNORMAL HIGH (ref 15–41)
Albumin: 4.5 g/dL (ref 3.5–5.0)
Alkaline Phosphatase: 78 U/L (ref 38–126)
BUN: 14 mg/dL (ref 6–20)
CHLORIDE: 106 mmol/L (ref 101–111)
CO2: 24 mmol/L (ref 22–32)
CREATININE: 1.24 mg/dL (ref 0.61–1.24)
Calcium: 9.4 mg/dL (ref 8.9–10.3)
Glucose, Bld: 131 mg/dL — ABNORMAL HIGH (ref 65–99)
POTASSIUM: 3.1 mmol/L — AB (ref 3.5–5.1)
SODIUM: 143 mmol/L (ref 135–145)
Total Bilirubin: 0.5 mg/dL (ref 0.3–1.2)
Total Protein: 7.1 g/dL (ref 6.5–8.1)

## 2015-01-30 LAB — PROTIME-INR
INR: 0.91
PROTHROMBIN TIME: 12.5 s (ref 11.4–15.0)

## 2015-01-30 LAB — APTT: aPTT: 28 seconds (ref 24–36)

## 2015-01-30 LAB — TROPONIN I: Troponin I: 0.03 ng/mL (ref <0.031)

## 2015-01-30 LAB — TYPE AND SCREEN
ABO/RH(D): O POS
ANTIBODY SCREEN: NEGATIVE

## 2015-01-30 LAB — ETHANOL: ALCOHOL ETHYL (B): 6 mg/dL — AB (ref <5)

## 2015-01-30 MED ORDER — ONDANSETRON HCL 4 MG/2ML IJ SOLN
INTRAMUSCULAR | Status: AC
  Administered 2015-01-30: 4 mg
  Filled 2015-01-30: qty 2

## 2015-01-30 MED ORDER — MORPHINE SULFATE (PF) 4 MG/ML IV SOLN
4.0000 mg | Freq: Once | INTRAVENOUS | Status: AC
  Administered 2015-01-30: 4 mg via INTRAVENOUS
  Filled 2015-01-30: qty 1

## 2015-01-30 MED ORDER — MORPHINE SULFATE (PF) 4 MG/ML IV SOLN
INTRAVENOUS | Status: AC
  Administered 2015-01-30: 14:00:00
  Filled 2015-01-30: qty 1

## 2015-01-30 MED ORDER — HYDROMORPHONE HCL 1 MG/ML IJ SOLN
1.0000 mg | Freq: Once | INTRAMUSCULAR | Status: DC
  Filled 2015-01-30: qty 1

## 2015-01-30 MED ORDER — HYDROMORPHONE HCL 1 MG/ML IJ SOLN
INTRAMUSCULAR | Status: AC
  Administered 2015-01-30: 1 mg via INTRAVENOUS
  Filled 2015-01-30: qty 1

## 2015-01-30 MED ORDER — IOHEXOL 350 MG/ML SOLN
100.0000 mL | Freq: Once | INTRAVENOUS | Status: AC | PRN
  Administered 2015-01-30: 100 mL via INTRAVENOUS

## 2015-01-30 MED ORDER — MORPHINE SULFATE (PF) 4 MG/ML IV SOLN
INTRAVENOUS | Status: AC
  Administered 2015-01-30: 4 mg
  Filled 2015-01-30: qty 1

## 2015-01-30 MED ORDER — HYDROMORPHONE HCL 1 MG/ML IJ SOLN
1.0000 mg | Freq: Once | INTRAMUSCULAR | Status: AC
  Administered 2015-01-30: 1 mg via INTRAVENOUS

## 2015-01-30 NOTE — ED Notes (Signed)
Pt transported to CT with medic

## 2015-01-30 NOTE — ED Notes (Signed)
Pt arrives POV from a fall about 25 feet, states he was cleaning out gutters and fell flat on his back off the ladder, pt arrives screaming in pain, c-collar placed upon arrival, pt pale and diaphoretic upon arrival, MD at bedside, complaining of right shoulder pain and lower back pain

## 2015-01-30 NOTE — ED Notes (Signed)
Pt placed on backbaord, [facility] at bedside

## 2015-01-30 NOTE — ED Provider Notes (Addendum)
[facility] Emergency Department Provider Note  ____________________________________________  Time seen: Approximately 12:06 PM  I have reviewed the triage vital signs and the nursing notes.   HISTORY  Chief Complaint Fall  Caveat-history of present illness and review of systems Limited due to pain.  HPI Alvin Gonzalez is a 54 y.o. male history of hypertension, anxiety and depression who presents for evaluation of diffuse pain after fall. The patient reports that he was cleaning gutters when he fell from a 25-30 foot ladder. He did not hit his head or lose consciousness. He is complaining of left shoulder pain, left chest and abdominal pain as well as back pain. Prior to today, he had been in his usual state of health.   Past Medical History  Diagnosis Date  . Hypertension   . Tobacco abuse   . Depression   . Suicide attempt ([place])     prior to 02/18/14 admission   . Chronic back pain   . Intermittent explosive disorder   . Anxiety   . Depression     Patient Active Problem List   Diagnosis Date Noted  . Overdose 02/18/2014  . Hypokalemia 02/18/2014  . Suicide attempt ([place]) 02/18/2014  . Respiratory failure ([place]) 02/18/2014  . Hypotension 02/18/2014  . Tachycardia 02/18/2014  . AKI (acute kidney injury) ([place]) 02/18/2014  . Leukocytosis 02/18/2014  . Acute encephalopathy 02/18/2014    Past Surgical History  Procedure Laterality Date  . Lymph node biopsy      Current Outpatient Rx  Name  Route  Sig  Dispense  Refill  . amitriptyline (ELAVIL) 25 MG tablet   Oral   Take 75 mg by mouth every evening.         . cyclobenzaprine (FLEXERIL) 10 MG tablet   Oral   Take 1 tablet (10 mg total) by mouth 3 (three) times daily as needed for muscle spasms.   15 tablet   0   . diclofenac sodium (VOLTAREN) 1 % GEL   Topical   Apply 2 g topically 4 (four) times daily as needed (for pain).         [name] FLUoxetine (PROZAC) 20 MG capsule   Oral    Take 20 mg by mouth every morning.         . hydrochlorothiazide (HYDRODIURIL) 50 MG tablet   Oral   Take 50 mg by mouth daily.         [name] HYDROcodone-acetaminophen (NORCO/VICODIN) 5-325 MG tablet   Oral   Take 1 tablet by mouth every 4 (four) hours as needed for moderate pain.   20 tablet   0   . lisinopril (PRINIVIL,ZESTRIL) 20 MG tablet   Oral   Take 20 mg by mouth daily.         . meloxicam (MOBIC) 15 MG tablet   Oral   Take 1 tablet (15 mg total) by mouth daily.   30 tablet   0   . naproxen sodium (ALEVE) 220 MG tablet   Oral   Take 440 mg by mouth 2 (two) times daily as needed (pain).         [name] oxyCODONE-acetaminophen (PERCOCET/ROXICET) 5-325 MG per tablet   Oral   Take 1 tablet by mouth every 8 (eight) hours as needed for moderate pain or severe pain.   11 tablet   0   . risperiDONE (RISPERDAL) 2 MG tablet   Oral   Take 2 mg by mouth every evening.  Allergies Ibuprofen and Tegretol  History reviewed. No pertinent family history.  Social History Social History  Substance Use Topics  . Smoking status: Current Every Day Smoker -- 1.00 packs/day    Types: Cigarettes  . Smokeless tobacco: None  . Alcohol Use: No    Review of Systems Cardiovascular: + chest pain. Gastrointestinal: + abdominal pain.   Musculoskeletal: Positive for back pain.  Caveat-history of present illness and review of systems Limited due to pain. ____________________________________________   PHYSICAL EXAM:  VITAL SIGNS: ED Triage Vitals  Enc Vitals Group     BP 01/30/15 1145 129/89 mmHg     Pulse Rate 01/30/15 1145 106     Resp 01/30/15 1145 22     Temp 01/30/15 1145 97.1 F (36.2 C)     Temp Source 01/30/15 1145 Oral     SpO2 01/30/15 1145 93 %     Weight 01/30/15 1145 220 lb (99.791 kg)     Height 01/30/15 1145 6' (1.829 m)     Head Cir --      Peak Flow --      Pain Score 01/30/15 1142 10     Pain Loc --      Pain Edu? --      Excl. in [place]? --      Constitutional: Alert and oriented. In severe distress second to pain. Eyes: Conjunctivae are normal. PERRL. EOMI. Head: Atraumatic. Nose: No congestion/rhinnorhea. Mouth/Throat: Mucous membranes are moist.  Oropharynx non-erythematous. Neck: No stridor. No midline tenderness. C-collar applied in the emergency department. Cardiovascular: mildly tachycardic rate, regular rhythm. Grossly normal heart sounds.  Good peripheral circulation. Respiratory: Mild tachypnea.  No retractions. Lungs CTAB. Gastrointestinal: Soft and nontender. No distention. No mobile sounds Genitourinary: No blood at the urethral meatus. No evidence of penile trauma. Musculoskeletal: Tenderness and swelling at the right shoulder, 2+ right radial pulse, moves the fingers of the right hand. Tenderness to palpation throughout the right chest wall. Tender to palpation throughout the midline of the lower thoracic spine as well as the lumbar spine. Pelvis is stable to rock and compression. Neurologic:  Normal speech and language. No gross focal neurologic deficits are appreciated.  Skin:  Skin is warm, dry and intact. No rash noted. Psychiatric: Mood and affect are normal. Speech and behavior are normal.  ____________________________________________   LABS (all labs ordered are listed, but only abnormal results are displayed)  Labs Reviewed  CBC WITH DIFFERENTIAL/PLATELET - Abnormal; Notable for the following:    WBC 13.6 (*)    Neutro Abs 8.8 (*)    Lymphs Abs 3.7 (*)    All other components within normal limits  COMPREHENSIVE METABOLIC PANEL - Abnormal; Notable for the following:    Potassium 3.1 (*)    Glucose, Bld 131 (*)    AST 301 (*)    ALT 269 (*)    All other components within normal limits  ETHANOL - Abnormal; Notable for the following:    Alcohol, Ethyl (B) 6 (*)    All other components within normal limits  PROTIME-INR  APTT  TROPONIN I  URINE DRUG SCREEN, QUALITATIVE ([facility] ONLY)  TYPE AND  SCREEN  ABO/RH   ____________________________________________  EKG  ED ECG REPORT I, Joanne Gavel, the attending physician, personally viewed and interpreted this ECG.   Date: 01/30/2015  EKG Time: 12:04  Rate: 103  Rhythm: sinus tachycardia  Axis: normal  Intervals:none  ST&T Change: No acute ST elevation. Q-wave in aVL and V2. ____________________________________________  RADIOLOGY  CT chest, abdomen and pelvis CT CHEST  There are multiple right-sided rib fractures affecting ribs number 6 through 10. Minimal transverse process fracture on the right at T10. No vertebral body fracture. Small amount of pleural air on the right at the base. Small amount of extrapleural air in the chest wall. No evidence of significant pulmonary contusion. No evidence of mediastinal vascular injury. No sternal fracture. No scapular fracture. Question right humerus anatomic neck fracture.  CT ABDOMEN AND PELVIS  No evidence of injury to the liver, spleen, pancreas, adrenal glands or kidneys. There is atherosclerosis of the aorta but no aneurysm. The IVC is normal. No retroperitoneal mass or adenopathy. No free intraperitoneal fluid or air. No evidence of bowel injury. Right transverse process fractures at L2 and L3. No significant hemorrhage associated with those. No pelvic fracture.  IMPRESSION: No evidence of solid organ injury.  Fractures of the right sixth through tenth ribs posteriorly as marked. Small amount of pleural air at the right base.  Fracture the right transverse process of T10. Fracture of the right transverse processes at L2 and L3.  CT head and c-spine IMPRESSION: No acute intracranial abnormality.  Degenerative changes in the cervical spine. No acute bony abnormality.   ____________________________________________   PROCEDURES  Procedure(s) performed: None  Critical Care performed: Yes. Total critical care time spent 40  minutes.  ____________________________________________   INITIAL IMPRESSION / ASSESSMENT AND PLAN / ED COURSE  Pertinent labs & imaging results that were available during my care of the patient were reviewed by me and considered in my medical decision making (see chart for details).  Alvin Gonzalez is a 54 y.o. male history of hypertension, anxiety and depression who presents for evaluation of diffuse pain after fall. On arrival to the emergency department, c-collar placed. He is mildly tachycardic but maintaining adequate blood pressure. We'll obtain trauma [place], treat his pain, obtain basic labs.  ----------------------------------------- 12:56 PM on 01/30/2015 ----------------------------------------- Patient remains hemodynamically stable. He is still awake, alert, oriented. Labs reviewed and are notable for leukocytosis. Normal hemoglobin. CMP notable for mild hypokalemia, AST and ALT elevation. Alcohol level was 6. Negative troponin. CT scan shows multiple bony injuries including right humeral neck fracture, transverse process fractures at T10, L2, L3 as well as fractures of the right 6- 10th ribs. Case discussed with Dr. Cornelious Bryant of [facility] ER and Dr. Rosalene Billings of [facility] trauma surgerywho will accept the patient as red trauma transfer. We discussed that given pneumothorax of less than 5 cc (according to my discussion with reading radiologist Dr. Gilford Silvius) will not place chest tube at this time. ____________________________________________   FINAL CLINICAL IMPRESSION(S) / ED DIAGNOSES  Final diagnoses:  Trauma  Injury resulting from fall from height  Rib fractures, right, closed, initial encounter  Humeral surgical neck fracture, right, closed, initial encounter  Lumbar transverse process fracture, closed, initial encounter [facility])  Fracture of thoracic transverse process, closed, initial encounter ([place])      Joanne Gavel, MD 01/30/15 [place] Charese Abundis, MD 01/30/15  [place] Carime Dinkel, MD 01/30/15 1536

## 2015-01-30 NOTE — ED Notes (Signed)
Pt placed on 2L [place]

## 2015-02-19 ENCOUNTER — Encounter: Payer: Self-pay | Admitting: Emergency Medicine

## 2015-02-19 ENCOUNTER — Emergency Department
Admission: EM | Admit: 2015-02-19 | Discharge: 2015-02-19 | Disposition: A | Discharge status: Home / Self Care | Payer: Self-pay | Attending: Emergency Medicine | Admitting: Emergency Medicine

## 2015-02-19 DIAGNOSIS — Z791 Long term (current) use of non-steroidal anti-inflammatories (NSAID): Secondary | ICD-10-CM | POA: Insufficient documentation

## 2015-02-19 DIAGNOSIS — Z79899 Other long term (current) drug therapy: Secondary | ICD-10-CM | POA: Insufficient documentation

## 2015-02-19 DIAGNOSIS — F17210 Nicotine dependence, cigarettes, uncomplicated: Secondary | ICD-10-CM | POA: Insufficient documentation

## 2015-02-19 DIAGNOSIS — Z760 Encounter for issue of repeat prescription: Secondary | ICD-10-CM | POA: Insufficient documentation

## 2015-02-19 DIAGNOSIS — M25551 Pain in right hip: Secondary | ICD-10-CM | POA: Insufficient documentation

## 2015-02-19 DIAGNOSIS — R0781 Pleurodynia: Secondary | ICD-10-CM | POA: Insufficient documentation

## 2015-02-19 DIAGNOSIS — F329 Major depressive disorder, single episode, unspecified: Secondary | ICD-10-CM | POA: Insufficient documentation

## 2015-02-19 DIAGNOSIS — I10 Essential (primary) hypertension: Secondary | ICD-10-CM | POA: Insufficient documentation

## 2015-02-19 DIAGNOSIS — M25511 Pain in right shoulder: Secondary | ICD-10-CM | POA: Insufficient documentation

## 2015-02-19 MED ORDER — HYDROMORPHONE HCL 1 MG/ML IJ SOLN
1.0000 mg | Freq: Once | INTRAMUSCULAR | Status: AC
  Administered 2015-02-19: 1 mg via INTRAMUSCULAR
  Filled 2015-02-19: qty 1

## 2015-02-19 MED ORDER — KETOROLAC TROMETHAMINE 60 MG/2ML IM SOLN
60.0000 mg | Freq: Once | INTRAMUSCULAR | Status: AC
  Administered 2015-02-19: 60 mg via INTRAMUSCULAR
  Filled 2015-02-19: qty 2

## 2015-02-19 MED ORDER — ONDANSETRON 8 MG PO TBDP
8.0000 mg | ORAL_TABLET | Freq: Once | ORAL | Status: AC
  Administered 2015-02-19: 8 mg via ORAL
  Filled 2015-02-19: qty 1

## 2015-02-19 NOTE — ED Notes (Signed)
Patient states he also had a chest tube while hospitalized at [facility].

## 2015-02-19 NOTE — ED Notes (Signed)
Patient here this AM complaining of right shoulder, right ribs, right hip. [place] out of tree while cutting it on Dec. 21, seen here and transferred to [facility]. Released from [facility] on 02/12/15. Here today complaining of pain from injuries, has not called [facility]. Oxycodone 5 mg. Given to patient for 20 mg. Dose. Patient states he is out of the medication - but it was helping.

## 2015-02-19 NOTE — ED Notes (Signed)
See triage note. conts to have pain to right shoulder,rib area and hip area S/p fall in dec. Is currently out of pain meds

## 2015-02-19 NOTE — Discharge Instructions (Signed)
Advised follow-up at [facility] treating doctor for prescription renewal.

## 2015-02-19 NOTE — ED Provider Notes (Signed)
[facility] Emergency Department Provider Note  ____________________________________________  Time seen: Approximately 10:20 AM  I have reviewed the triage vital signs and the nursing notes.   HISTORY  Chief Complaint Shoulder Pain    HPI Gray Affleck is a 55 y.o. male patient requested a refill of Percocet. Patient is taking medications status post ruptured right shoulder rib and hip after falling from a tree. History shows the patient was told to take 1 tablet 4 times a day and the patient is taking 4 tablets 4 times a day. Patient was prescribed 200 tablets on 02/12/2015. The patient is wearing a shoulder sling in a position that a support this healing fracture might be contributing to his complaint of continual pain. Patient was released from [facility] on 02/12/2015 has not followed up or discuss his medication usage since discharge. Patient is rating his pain as 8/10. Describes the pain as sharp.  Past Medical History  Diagnosis Date  . Hypertension   . Tobacco abuse   . Depression   . Suicide attempt ([place])     prior to 02/18/14 admission   . Chronic back pain   . Intermittent explosive disorder   . Anxiety   . Depression     Patient Active Problem List   Diagnosis Date Noted  . Overdose 02/18/2014  . Hypokalemia 02/18/2014  . Suicide attempt ([place]) 02/18/2014  . Respiratory failure ([place]) 02/18/2014  . Hypotension 02/18/2014  . Tachycardia 02/18/2014  . AKI (acute kidney injury) ([place]) 02/18/2014  . Leukocytosis 02/18/2014  . Acute encephalopathy 02/18/2014    Past Surgical History  Procedure Laterality Date  . Lymph node biopsy      Current Outpatient Rx  Name  Route  Sig  Dispense  Refill  . amitriptyline (ELAVIL) 25 MG tablet   Oral   Take 75 mg by mouth every evening.         . cyclobenzaprine (FLEXERIL) 10 MG tablet   Oral   Take 1 tablet (10 mg total) by mouth 3 (three) times daily as needed for muscle spasms.   15 tablet   0    . diclofenac sodium (VOLTAREN) 1 % GEL   Topical   Apply 2 g topically 4 (four) times daily as needed (for pain).         [name] FLUoxetine (PROZAC) 20 MG capsule   Oral   Take 20 mg by mouth every morning.         . hydrochlorothiazide (HYDRODIURIL) 50 MG tablet   Oral   Take 50 mg by mouth daily.         [name] HYDROcodone-acetaminophen (NORCO/VICODIN) 5-325 MG tablet   Oral   Take 1 tablet by mouth every 4 (four) hours as needed for moderate pain.   20 tablet   0   . lisinopril (PRINIVIL,ZESTRIL) 20 MG tablet   Oral   Take 20 mg by mouth daily.         . meloxicam (MOBIC) 15 MG tablet   Oral   Take 1 tablet (15 mg total) by mouth daily.   30 tablet   0   . naproxen sodium (ALEVE) 220 MG tablet   Oral   Take 440 mg by mouth 2 (two) times daily as needed (pain).         [name] oxyCODONE-acetaminophen (PERCOCET/ROXICET) 5-325 MG per tablet   Oral   Take 1 tablet by mouth every 8 (eight) hours as needed for moderate pain or severe pain.  11 tablet   0   . risperiDONE (RISPERDAL) 2 MG tablet   Oral   Take 2 mg by mouth every evening.           Allergies Ibuprofen and Tegretol  No family history on file.  Social History Social History  Substance Use Topics  . Smoking status: Current Every Day Smoker -- 1.00 packs/day    Types: Cigarettes  . Smokeless tobacco: Former Systems developer    Quit date: 01/30/2015     Comment: Using the "patch"  . Alcohol Use: No    Review of Systems Constitutional: No fever/chills Eyes: No visual changes. ENT: No sore throat. Cardiovascular: Denies chest pain. Respiratory: Denies shortness of breath. Gastrointestinal: No abdominal pain.  No nausea, no vomiting.  No diarrhea.  No constipation. Genitourinary: Negative for dysuria. Musculoskeletal: Right shoulder and right previous right hip pain Skin: Negative for rash. Neurological: Negative for headaches, focal weakness or numbness. Psychiatric:He resides and depression Endocrine:  Hypertension  Hematological/Lymphatic: Allergic/Immunilogical: Ibuprofen and Tegretol 10-point ROS otherwise negative.  ____________________________________________   PHYSICAL EXAM:  VITAL SIGNS: ED Triage Vitals  Enc Vitals Group     BP 02/19/15 0942 126/82 mmHg     Pulse Rate 02/19/15 0942 100     Resp 02/19/15 0942 20     Temp 02/19/15 0942 97.6 F (36.4 C)     Temp Source 02/19/15 0942 Oral     SpO2 02/19/15 0942 98 %     Weight 02/19/15 0942 155 lb (70.308 kg)     Height 02/19/15 0942 5\' 5"  (1.651 m)     Head Cir --      Peak Flow --      Pain Score 02/19/15 0944 8     Pain Loc --      Pain Edu? --      Excl. in [place]? --     Constitutional: Alert and oriented. Well appearing and in no acute distress. Eyes: Conjunctivae are normal. PERRL. EOMI. Head: Atraumatic. Nose: No congestion/rhinnorhea. Mouth/Throat: Mucous membranes are moist.  Oropharynx non-erythematous. Neck: No stridor.  No cervical spine tenderness to palpation. Hematological/Lymphatic/Immunilogical: No cervical lymphadenopathy. Cardiovascular: Normal rate, regular rhythm. Grossly normal heart sounds.  Good peripheral circulation. Respiratory: Normal respiratory effort.  No retractions. Lungs CTAB. Gastrointestinal: Soft and nontender. No distention. No abdominal bruits. No CVA tenderness. Musculoskeletal: No obvious deformity to the right shoulder. Patient sling is not supporting extremity in the proper position for healing.  Neurologic:  Normal speech and language. No gross focal neurologic deficits are appreciated. No gait instability. Skin:  Skin is warm, dry and intact. No rash noted. Psychiatric: Mood and affect are normal. Speech and behavior are normal.  ____________________________________________   LABS (all labs ordered are listed, but only abnormal results are displayed)  Labs Reviewed - No data to  display ____________________________________________  EKG   ____________________________________________  RADIOLOGY   ____________________________________________   PROCEDURES  Procedure(s) performed: None  Critical Care performed: No  ____________________________________________   INITIAL IMPRESSION / ASSESSMENT AND PLAN / ED COURSE  Pertinent labs & imaging results that were available during my care of the patient were reviewed by me and considered in my medical decision making (see chart for details).  Medication refill. Due to the amount of medicine prescribed and the patient overusage by not following the prescription details advised to follow-up at [facility] in order to have his medicine refilled . Patient was in place in the shoulder immobilizer and given Dilaudid and Toradol IM. Patient  advised to follow [facility] for the next 24 hours.  ____________________________________________   FINAL CLINICAL IMPRESSION(S) / ED DIAGNOSES  Final diagnoses:  Encounter for medication refill      Sable Feil, PA-C 02/19/15 1103  Schuyler Amor, MD 02/19/15 1409

## 2015-07-17 DIAGNOSIS — Z79891 Long term (current) use of opiate analgesic: Secondary | ICD-10-CM | POA: Insufficient documentation

## 2015-07-17 DIAGNOSIS — I10 Essential (primary) hypertension: Secondary | ICD-10-CM | POA: Insufficient documentation

## 2015-07-17 DIAGNOSIS — G8929 Other chronic pain: Secondary | ICD-10-CM | POA: Insufficient documentation

## 2015-07-17 DIAGNOSIS — F324 Major depressive disorder, single episode, in partial remission: Secondary | ICD-10-CM | POA: Insufficient documentation

## 2015-08-12 DIAGNOSIS — S4290XK Fracture of unspecified shoulder girdle, part unspecified, subsequent encounter for fracture with nonunion: Secondary | ICD-10-CM | POA: Insufficient documentation

## 2015-09-13 ENCOUNTER — Emergency Department
Admission: EM | Admit: 2015-09-13 | Discharge: 2015-09-13 | Disposition: A | Discharge status: Home / Self Care | Payer: Self-pay | Attending: Emergency Medicine | Admitting: Emergency Medicine

## 2015-09-13 ENCOUNTER — Emergency Department: Payer: Self-pay

## 2015-09-13 DIAGNOSIS — I10 Essential (primary) hypertension: Secondary | ICD-10-CM | POA: Insufficient documentation

## 2015-09-13 DIAGNOSIS — M549 Dorsalgia, unspecified: Secondary | ICD-10-CM

## 2015-09-13 DIAGNOSIS — M545 Low back pain: Secondary | ICD-10-CM | POA: Insufficient documentation

## 2015-09-13 DIAGNOSIS — F17210 Nicotine dependence, cigarettes, uncomplicated: Secondary | ICD-10-CM | POA: Insufficient documentation

## 2015-09-13 DIAGNOSIS — G8929 Other chronic pain: Secondary | ICD-10-CM | POA: Insufficient documentation

## 2015-09-13 DIAGNOSIS — Z79899 Other long term (current) drug therapy: Secondary | ICD-10-CM | POA: Insufficient documentation

## 2015-09-13 LAB — URINE DRUG SCREEN, QUALITATIVE (ARMC ONLY)
AMPHETAMINES, UR SCREEN: NOT DETECTED
BARBITURATES, UR SCREEN: NOT DETECTED
Benzodiazepine, Ur Scrn: POSITIVE — AB
COCAINE METABOLITE, UR ~~LOC~~: NOT DETECTED
Cannabinoid 50 Ng, Ur ~~LOC~~: NOT DETECTED
MDMA (Ecstasy)Ur Screen: NOT DETECTED
METHADONE SCREEN, URINE: NOT DETECTED
OPIATE, UR SCREEN: POSITIVE — AB
PHENCYCLIDINE (PCP) UR S: NOT DETECTED
Tricyclic, Ur Screen: POSITIVE — AB

## 2015-09-13 LAB — URINALYSIS COMPLETE WITH MICROSCOPIC (ARMC ONLY)
BILIRUBIN URINE: NEGATIVE
GLUCOSE, UA: NEGATIVE mg/dL
HGB URINE DIPSTICK: NEGATIVE
KETONES UR: NEGATIVE mg/dL
Leukocytes, UA: NEGATIVE
NITRITE: NEGATIVE
Protein, ur: NEGATIVE mg/dL
Specific Gravity, Urine: 1.021 (ref 1.005–1.030)
pH: 6 (ref 5.0–8.0)

## 2015-09-13 MED ORDER — NAPROXEN 500 MG PO TBEC: 500.0000 mg | DELAYED_RELEASE_TABLET | Freq: Two times a day (BID) | ORAL | 2 refills | Status: DC

## 2015-09-13 MED ORDER — HYDROCODONE-ACETAMINOPHEN 5-325 MG PO TABS
1.0000 | ORAL_TABLET | Freq: Once | ORAL | Status: AC
  Administered 2015-09-13: 1 via ORAL
  Filled 2015-09-13: qty 1

## 2015-09-13 MED ORDER — KETOROLAC TROMETHAMINE 30 MG/ML IJ SOLN
30.0000 mg | Freq: Once | INTRAMUSCULAR | Status: AC
  Administered 2015-09-13: 30 mg via INTRAVENOUS
  Filled 2015-09-13: qty 1

## 2015-09-13 NOTE — ED Triage Notes (Signed)
Pt comes into the ED via EMS from home with c/o chronic low back pain states he had fx to his back last year from a fall and this morning while trying to start a pressure washer and pulled something in his back.the patient states they are trying to get him set up with a pain clinic.[name]

## 2015-09-13 NOTE — Discharge Instructions (Signed)
Call your doctor at [facility] for continued treatment of your chronic back pain.  Take medication as directed Eat before taking medication

## 2015-09-13 NOTE — ED Provider Notes (Signed)
[facility] Emergency Department Provider Note   ____________________________________________   First MD Initiated Contact with Patient 09/13/15 1242     (approximate)  I have reviewed the triage vital signs and the nursing notes.   HISTORY  Chief Complaint Back Pain   HPI Alvin Gonzalez is a 55 y.o. male grafts to the emergency room via EMS from home with complaint of back pain. Patient states he "broke my  back" in December 2016. Patient states today he was using a pressure washer and had severe pain when he moved a certain way. Patient states he was able to ambulate afterwards but was in extreme pain. Patient has a history of chronic back pain and is being followed by a doctor at [facility]. Patient is unaware of the doctor's name. He states that arrangements are being made for him to see the pain clinic in [facility] but "it will be her before I can see them". Patient denies any dysuria or history of kidney stones. He denies any paresthesias or incontinence of bowel or bladder. Currently patient rates his pain as an 8 out of 10. Pain is increased with movement. Patient states his pain continues lying supine.    Past Medical History:  Diagnosis Date  . Anxiety   . Chronic back pain   . Depression   . Depression   . Hypertension   . Intermittent explosive disorder   . Suicide attempt ([place])    prior to 02/18/14 admission   . Tobacco abuse     Patient Active Problem List   Diagnosis Date Noted  . Overdose 02/18/2014  . Hypokalemia 02/18/2014  . Suicide attempt ([place]) 02/18/2014  . Respiratory failure ([place]) 02/18/2014  . Hypotension 02/18/2014  . Tachycardia 02/18/2014  . AKI (acute kidney injury) ([place]) 02/18/2014  . Leukocytosis 02/18/2014  . Acute encephalopathy 02/18/2014    Past Surgical History:  Procedure Laterality Date  . "broken back"    . LYMPH NODE BIOPSY      Prior to Admission medications   Medication Sig Start Date End Date  Taking? Authorizing Provider  amitriptyline (ELAVIL) 25 MG tablet Take 75 mg by mouth every evening.    Historical Provider, MD  cyclobenzaprine (FLEXERIL) 10 MG tablet Take 1 tablet (10 mg total) by mouth 3 (three) times daily as needed for muscle spasms. 01/05/15   Charline Bills Cuthriell, PA-C  FLUoxetine (PROZAC) 20 MG capsule Take 20 mg by mouth every morning.    Historical Provider, MD  hydrochlorothiazide (HYDRODIURIL) 50 MG tablet Take 50 mg by mouth daily.    Historical Provider, MD  lisinopril (PRINIVIL,ZESTRIL) 20 MG tablet Take 20 mg by mouth daily.    Historical Provider, MD  naproxen (EC NAPROSYN) 500 MG EC tablet Take 1 tablet (500 mg total) by mouth 2 (two) times daily with a meal. 09/13/15 09/12/16  Johnn Hai, PA-C  risperiDONE (RISPERDAL) 2 MG tablet Take 2 mg by mouth every evening.    Historical Provider, MD    Allergies Ibuprofen and Tegretol [carbamazepine]  No family history on file.  Social History Social History  Substance Use Topics  . Smoking status: Current Every Day Smoker    Packs/day: 1.00    Types: Cigarettes  . Smokeless tobacco: Former Systems developer    Quit date: 01/30/2015     Comment: Using the "patch"  . Alcohol use No    Review of Systems Constitutional: No fever/chills Cardiovascular: Denies chest pain. Respiratory: Denies shortness of breath. Gastrointestinal: No  abdominal pain.  No nausea, no vomiting.   Genitourinary: Negative for dysuria. Musculoskeletal: Positive for chronic back pain. Skin: Negative for rash. Neurological: Negative for headaches, focal weakness or numbness.  10-point ROS otherwise negative.  ____________________________________________   PHYSICAL EXAM:  VITAL SIGNS: ED Triage Vitals [09/13/15 1218]  Enc Vitals Group     BP 126/90     Pulse Rate (!) 101     Resp 18     Temp 97.7 F (36.5 C)     Temp Source Oral     SpO2 96 %     Weight 160 lb (72.6 kg)     Height      Head Circumference      Peak Flow       Pain Score 8     Pain Loc      Pain Edu?      Excl. in [place]?     Constitutional: Alert and oriented. Well appearing and in no acute distress.Patient is asleep on arrival to the room. Patient was able to answer questions and was oriented however his speech was somewhat slurred. Patient denied taking any pain medication prior to his arrival in the emergency room. No pain medication was given by EMS personnel. Eyes: Conjunctivae are normal. PERRL. EOMI. Head: Atraumatic. Nose: No congestion/rhinnorhea. Neck: No stridor.  No cervical tenderness on palpation posteriorly. Cardiovascular: Normal rate, regular rhythm. Grossly normal heart sounds.  Good peripheral circulation. Respiratory: Normal respiratory effort.  No retractions. Lungs CTAB. Gastrointestinal: Soft and nontender. No distention.  No CVA tenderness. Musculoskeletal: On examination of the back there is no gross deformity. Patient is tender on palpation of the lumbar spine for approximately L1-L5 S1 area. Patient is also tender paravertebral muscles bilaterally. No active muscle spasms are seen. Straight leg raises at 20 bilaterally increases patient's pain. Neurologic:  Normal speech and language. No gross focal neurologic deficits are appreciated. Reflexes were 1+ bilaterally. Skin:  Skin is warm, dry and intact. No rash noted. Psychiatric: Mood and affect are normal. Speech and behavior are normal.  ____________________________________________   LABS (all labs ordered are listed, but only abnormal results are displayed)  Labs Reviewed  URINALYSIS COMPLETEWITH MICROSCOPIC ([facility] ONLY) - Abnormal; Notable for the following:       Result Value   Color, Urine AMBER (*)    APPearance CLOUDY (*)    Bacteria, UA RARE (*)    Squamous Epithelial / LPF 0-5 (*)    All other components within normal limits  URINE DRUG SCREEN, QUALITATIVE ([facility] ONLY) - Abnormal; Notable for the following:    Tricyclic, Ur Screen POSITIVE (*)    Opiate,  Ur Screen POSITIVE (*)    Benzodiazepine, Ur Scrn POSITIVE (*)    All other components within normal limits     RADIOLOGY  Lumbar spine x-ray per radiologist shows mild degenerative changes without acute abnormality. I, Johnn Hai, personally viewed and evaluated these images (plain radiographs) as part of my medical decision making, as well as reviewing the written report by the radiologist. ____________________________________________   PROCEDURES  Procedure(s) performed: None  Procedures  Critical Care performed: No  ____________________________________________   INITIAL IMPRESSION / ASSESSMENT AND PLAN / ED COURSE  Pertinent labs & imaging results that were available during my care of the patient were reviewed by me and considered in my medical decision making (see chart for details).    Clinical Course   Urine drug screen was positive for opiates and when we discussed this  he now remembers that he took some Vicodin that was a "93 1/53 years old" 3-4 days ago.  Patient states that he currently is not using Voltaren gel as it caused him heartburn.  Patient states it is allergy to ibuprofen is also heartburn. Patient was seen in the emergency room in January at which time he was requesting a refill of his Percocet. He was given a injection of Toradol and discharged. We discussed follow-up with his primary care doctor in [facility] and also to get an earlier referral to the pain clinic. Patient was given an IM injection of Toradol. Patient was given a prescription for Naprosyn enteric-coated with instructions to take with food.  ____________________________________________   FINAL CLINICAL IMPRESSION(S) / ED DIAGNOSES  Final diagnoses:  Chronic back pain      NEW MEDICATIONS STARTED DURING THIS VISIT:  Discharge Medication List as of 09/13/2015  3:02 PM    START taking these medications   Details  naproxen (EC NAPROSYN) 500 MG EC tablet Take 1 tablet (500 mg  total) by mouth 2 (two) times daily with a meal., Starting Fri 09/13/2015, Until Sat 09/12/2016, Print         Note:  This document was prepared using Dragon voice recognition software and may include unintentional dictation errors.    Johnn Hai, PA-C 09/13/15 [place] Yao, MD 09/17/15 [phone]

## 2015-09-13 NOTE — ED Triage Notes (Addendum)
Pt arrives to ER via [facility] from home c/o back pain that radiates to right side of back. Pt hx of "broken back" 01/2015. Pt was trying to start a pressure washer today and moved a certain way causing pain. Pt able to ambulate but with extreme pain. Full sensation of extremities. Pt alert and oriented X4, active, cooperative, pt in NAD. RR even and unlabored, color WNL. No fall today.  Pt states he is not taking anything for pain at this time. Pt was taking Vicodin but last taken X 2 months ago. Pt awaiting acceptance into pain clinic per his report.

## 2015-10-23 DIAGNOSIS — Z9181 History of falling: Secondary | ICD-10-CM | POA: Insufficient documentation

## 2016-02-04 DIAGNOSIS — N489 Disorder of penis, unspecified: Secondary | ICD-10-CM | POA: Insufficient documentation

## 2016-02-04 DIAGNOSIS — F17200 Nicotine dependence, unspecified, uncomplicated: Secondary | ICD-10-CM | POA: Insufficient documentation

## 2016-06-19 DIAGNOSIS — R634 Abnormal weight loss: Secondary | ICD-10-CM | POA: Insufficient documentation

## 2016-07-28 ENCOUNTER — Telehealth: Payer: Self-pay | Admitting: Pharmacy Technician

## 2016-07-28 NOTE — Telephone Encounter (Signed)
Patient eligible to receive medication assistance at [facility] until 05/10/17 as long as eligibility requirements continue to be met.  Tamon Parkerson J. Melva Faux Care Manager [facility] 

## 2016-07-31 ENCOUNTER — Telehealth: Payer: Self-pay | Admitting: Pharmacist

## 2016-07-31 NOTE — Telephone Encounter (Signed)
07/31/16 Faxed [employer] application for Nicotrol Inhaler 10 mg Inhale contents of 1-16 cartridges daily, recommend 20 minutes of continuous puffing.Delos Haring

## 2016-08-03 IMAGING — CT CT ABD-PELV W/ CM
2 of 5 series · 15 of 46 positions shown, 17 images · IV contrast (omnipaque)
Comparison: Multiple studies earlier this year

CLINICAL DATA: Fell from a height of 25 feet.  Landed on his back.

EXAM:
CT CHEST, ABDOMEN, AND PELVIS WITH CONTRAST
TECHNIQUE: Multidetector CT imaging of the chest, abdomen and pelvis was
performed following the standard protocol during bolus
administration of intravenous contrast.
CONTRAST:  100mL OMNIPAQUE IOHEXOL 350 MG/ML SOLN

[Series 2: cap with · axial · 0.70mm/px · z∈[-826,-286]mm · 12 of 124 slices shown, 14 images]
[im 8/124  soft-tissue]
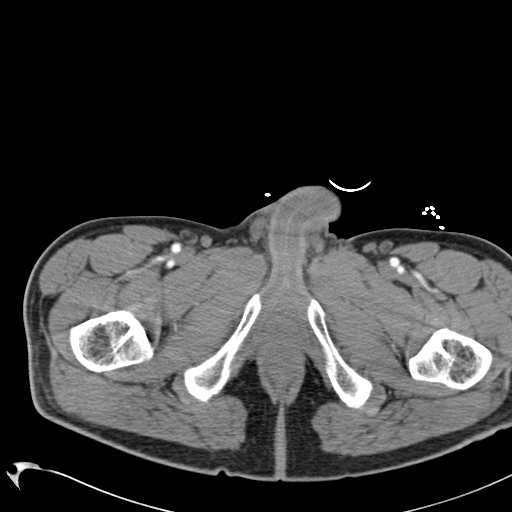
[im 8/124  bone]
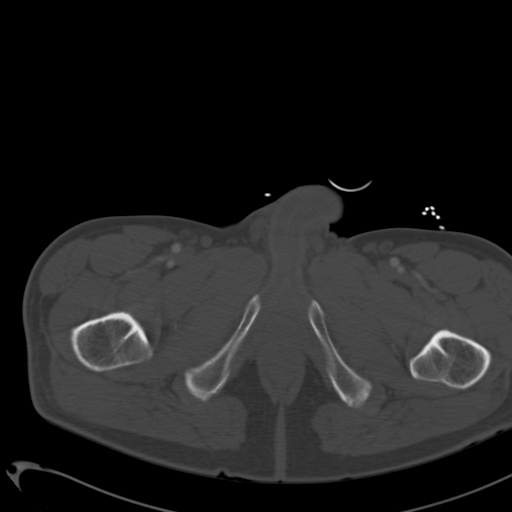
[im 22/124  soft-tissue]
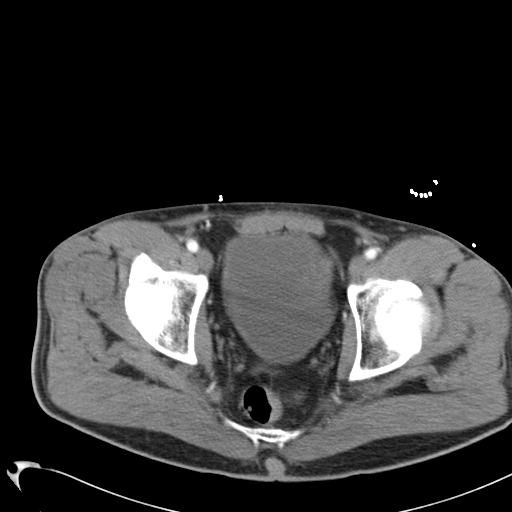
[im 29/124  soft-tissue]
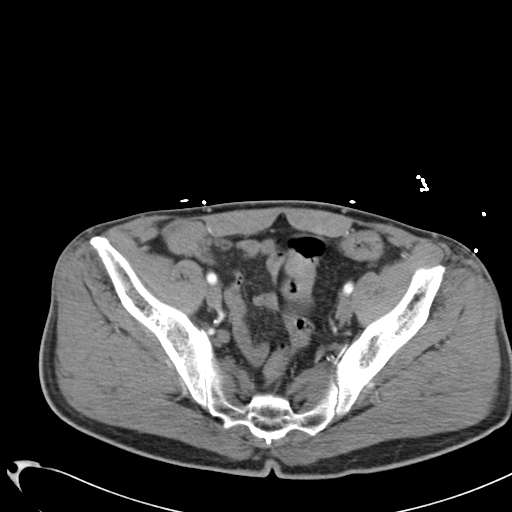
[im 37/124  soft-tissue]
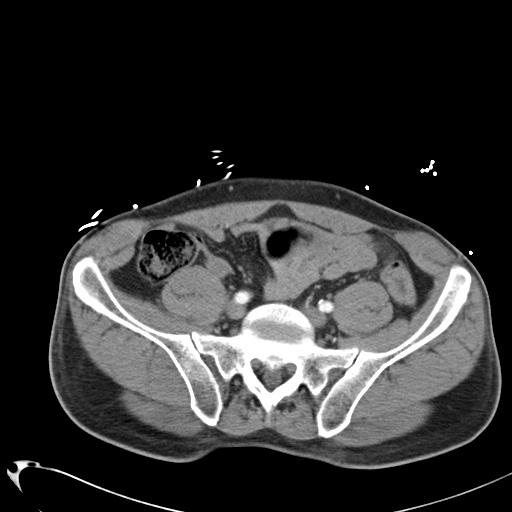
[im 51/124  soft-tissue]
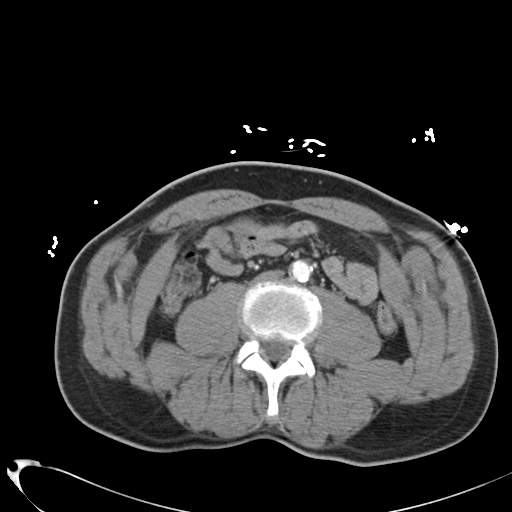
[im 58/124  soft-tissue]
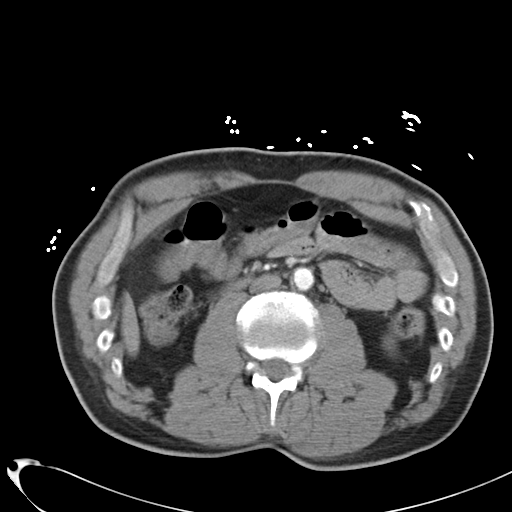
[im 66/124  soft-tissue]
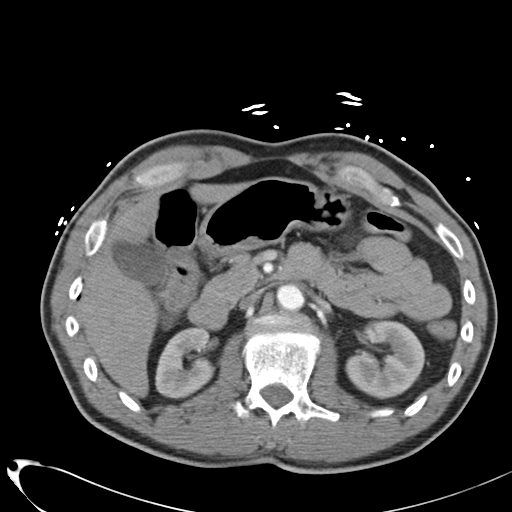
[im 80/124  soft-tissue]
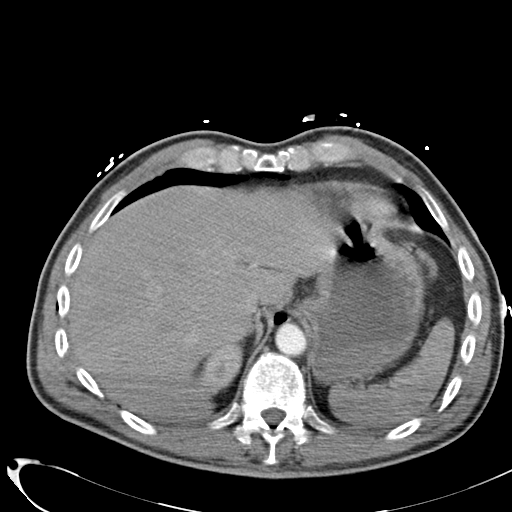
[im 87/124  soft-tissue]
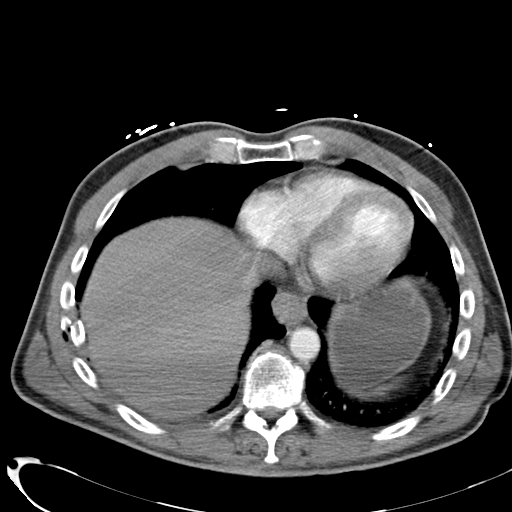
[im 87/124  bone]
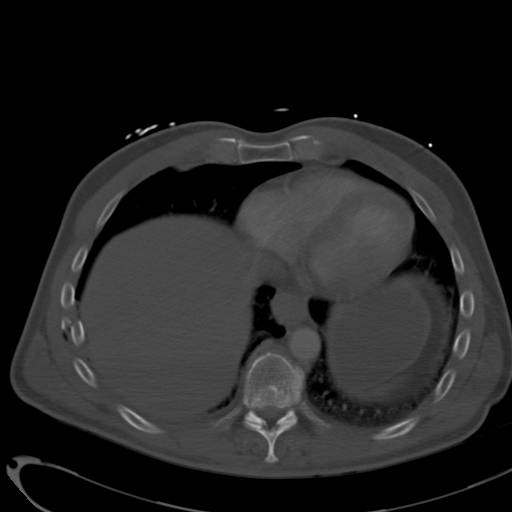
[im 95/124  soft-tissue]
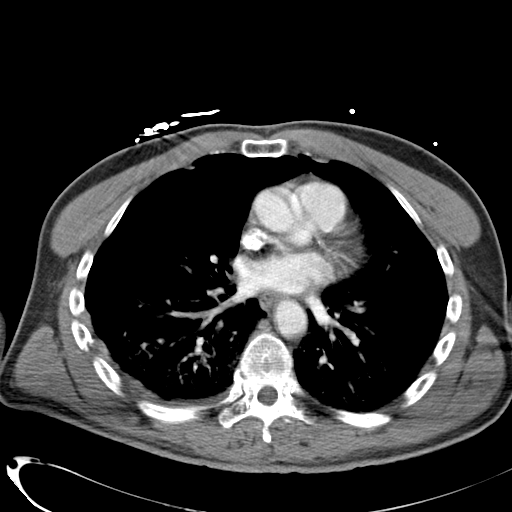
[im 109/124  soft-tissue]
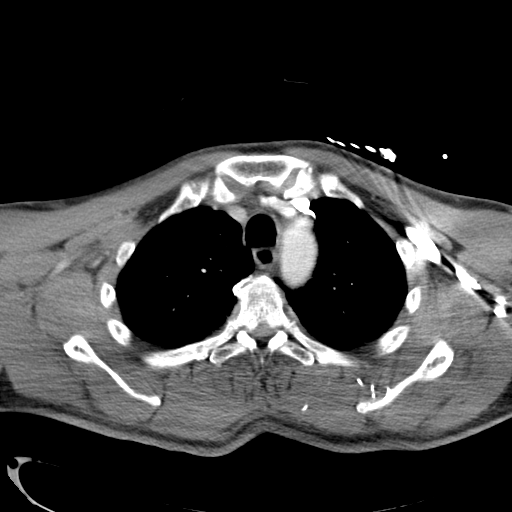
[im 116/124  soft-tissue]
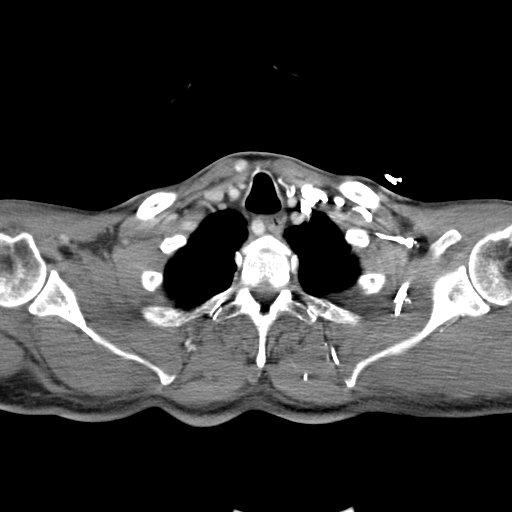

[Series 6: cor cap with cor cor · coronal · 0.72mm/px · 3 of 137 slices shown]
[im 46/137  soft-tissue]
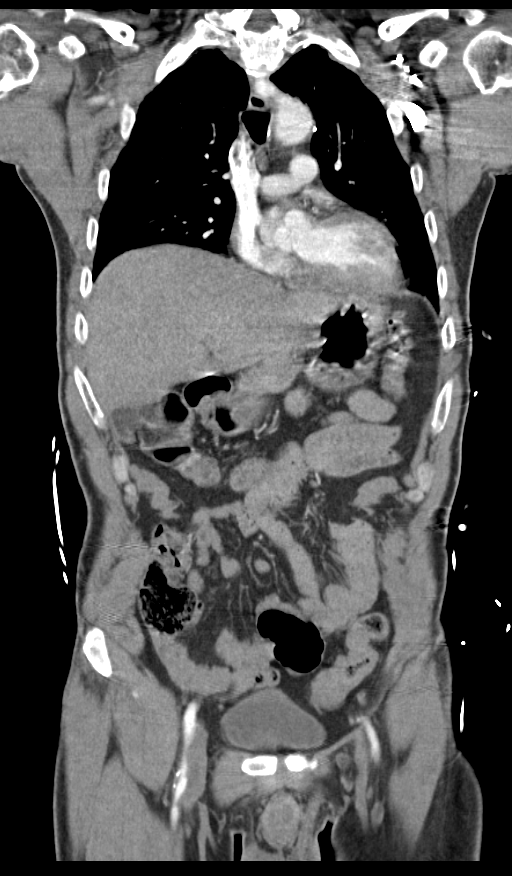
[im 61/137  soft-tissue]
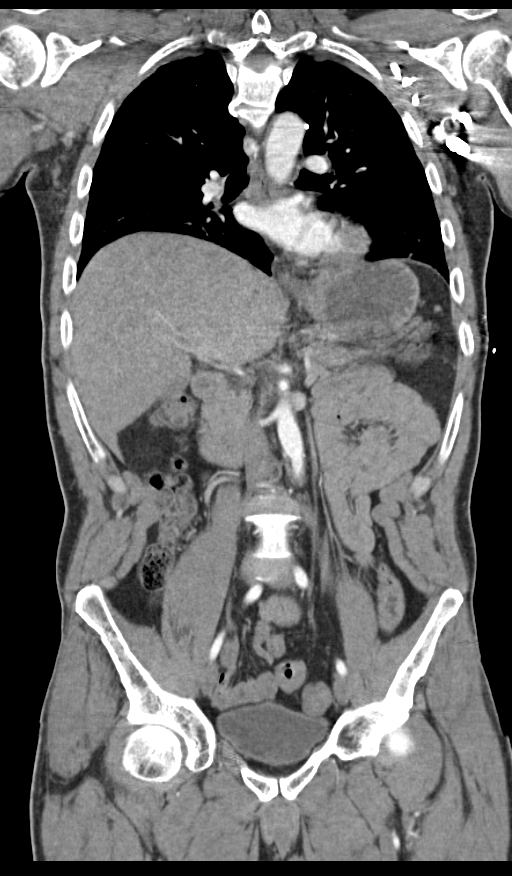
[im 76/137  soft-tissue]
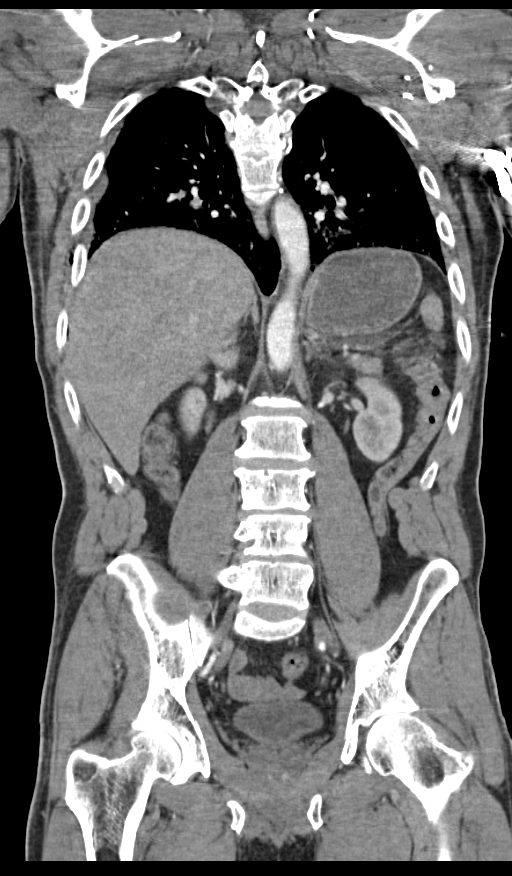

[15 of 46 positions shown; findings below may reference images not displayed]

FINDINGS: CT CHEST

There are multiple right-sided rib fractures affecting ribs number 6
through 10. Minimal transverse process fracture on the right at T10.
No vertebral body fracture. Small amount of pleural air on the right
at the base. Small amount of extrapleural air in the chest wall. No
evidence of significant pulmonary contusion. No evidence of
mediastinal vascular injury. No sternal fracture. No scapular
fracture. Question right humerus anatomic neck fracture.

CT ABDOMEN AND PELVIS

No evidence of injury to the liver, spleen, pancreas, adrenal glands
or kidneys. There is atherosclerosis of the aorta but no aneurysm.
The IVC is normal. No retroperitoneal mass or adenopathy. No free
intraperitoneal fluid or air. No evidence of bowel injury. Right
transverse process fractures at L2 and L3. No significant hemorrhage
associated with those. No pelvic fracture.
IMPRESSION: No evidence of solid organ injury.

Fractures of the right sixth through tenth ribs posteriorly as
marked. Small amount of pleural air at the right base.

Fracture the right transverse process of T10. Fracture of the right
transverse processes at L2 and L3.

## 2016-08-03 IMAGING — CT CT HEAD W/O CM
3 of 6 series · 14 of 47 positions shown, 16 images · non-contrast
Comparison: None.

CLINICAL DATA: Fell from ladder from 25 feet

EXAM:
CT HEAD WITHOUT CONTRAST
CT CERVICAL SPINE WITHOUT CONTRAST
TECHNIQUE: Multidetector CT imaging of the head and cervical spine was
performed following the standard protocol without intravenous
contrast. Multiplanar CT image reconstructions of the cervical spine
were also generated.

[Series 10: sagittal bone · sagittal · 0.28mm/px · 3 of 58 slices shown]
[im 20/58  brain]
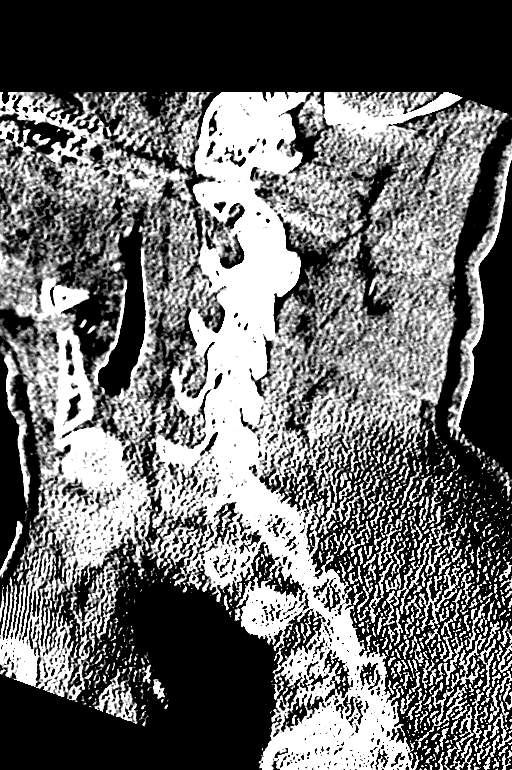
[im 29/58  brain]
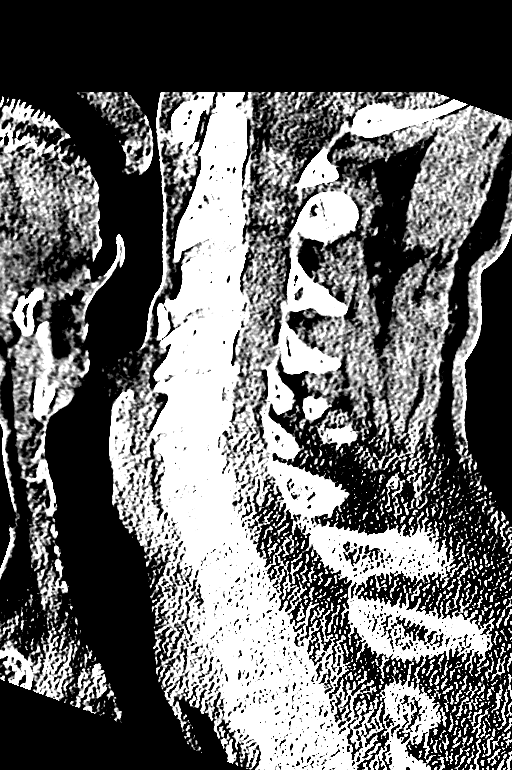
[im 39/58  brain]
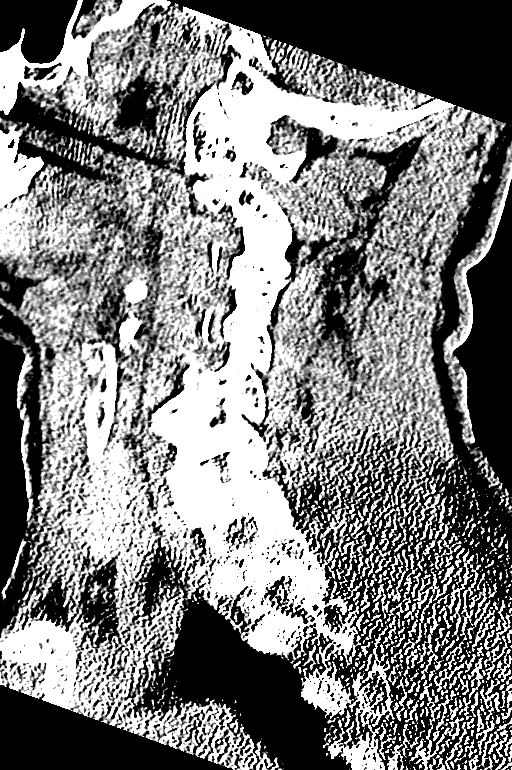

[Series 11: coronal bone · coronal · 0.29mm/px · 3 of 61 slices shown]
[im 21/61  brain]
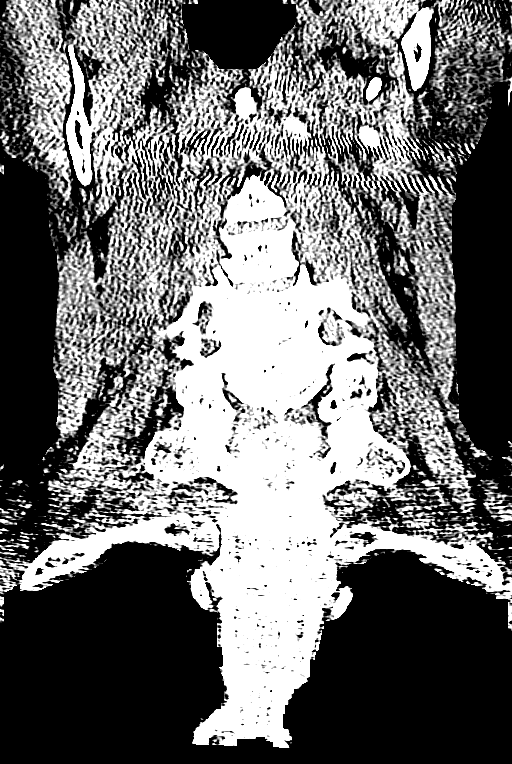
[im 27/61  brain]
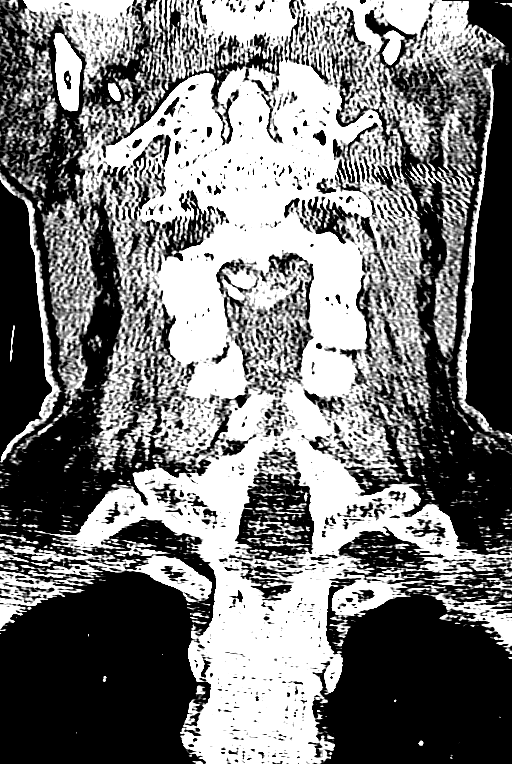
[im 34/61  brain]
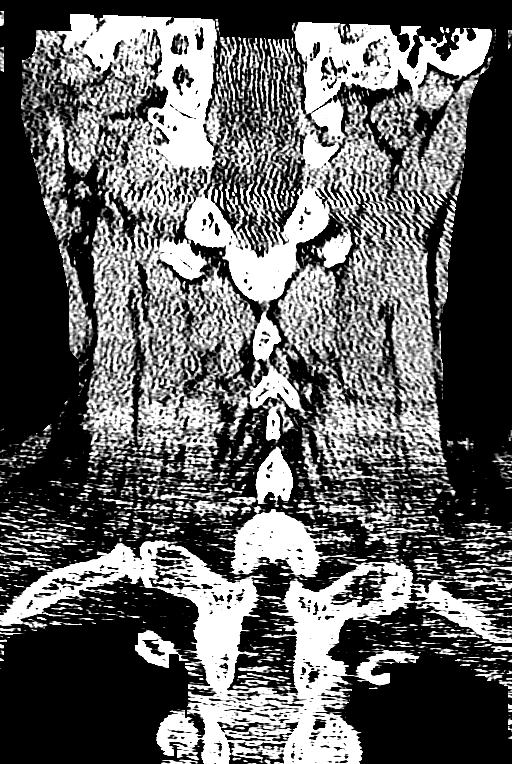

[Series 12: axial · axial · 0.27mm/px · z∈[-329,-177]mm · 8 of 106 slices shown, 10 images]
[im 12/106  brain]
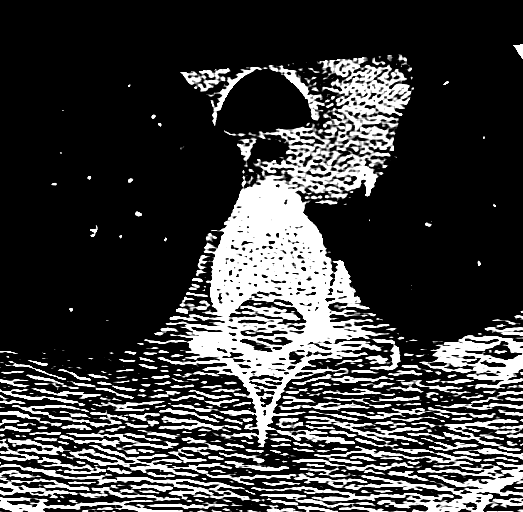
[im 12/106  bone]
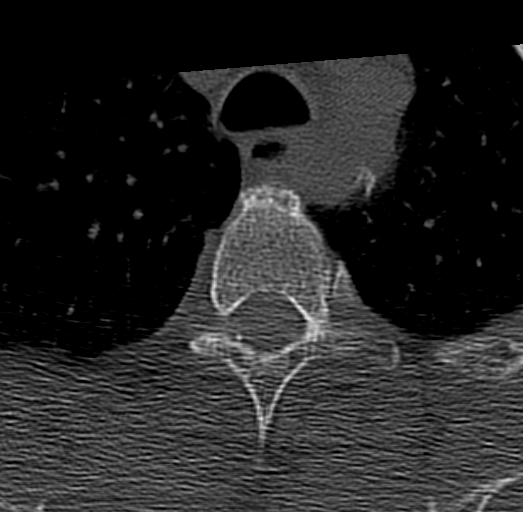
[im 24/106  brain]
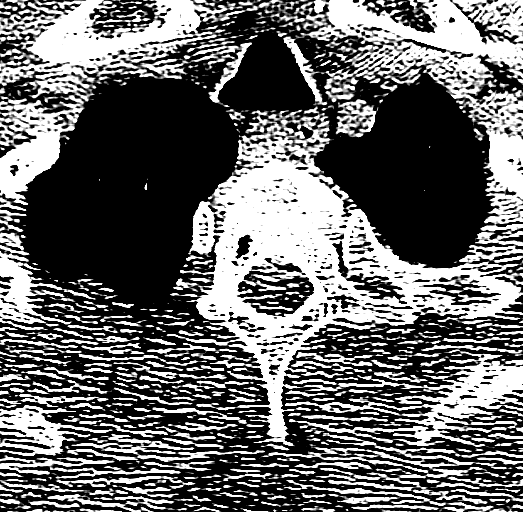
[im 36/106  brain]
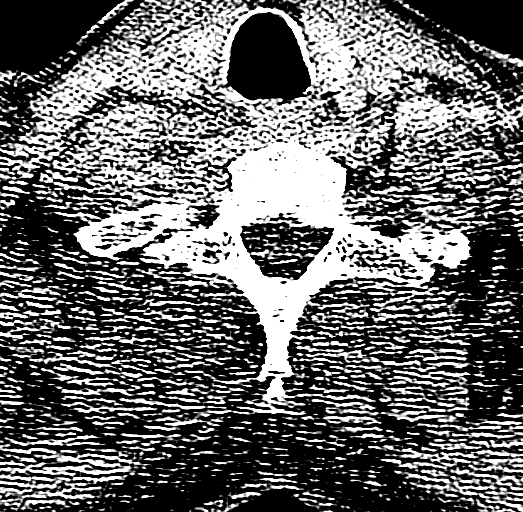
[im 47/106  brain]
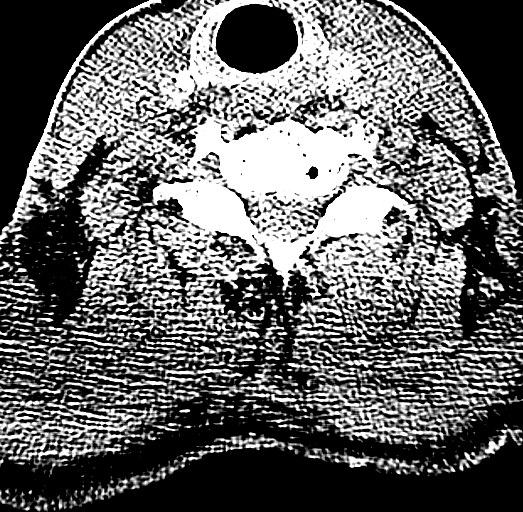
[im 59/106  brain]
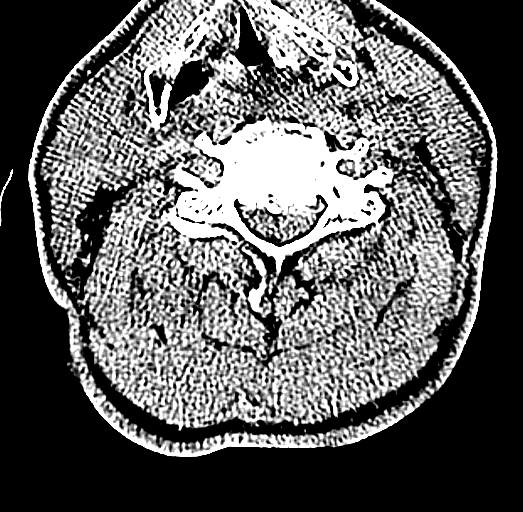
[im 59/106  bone]
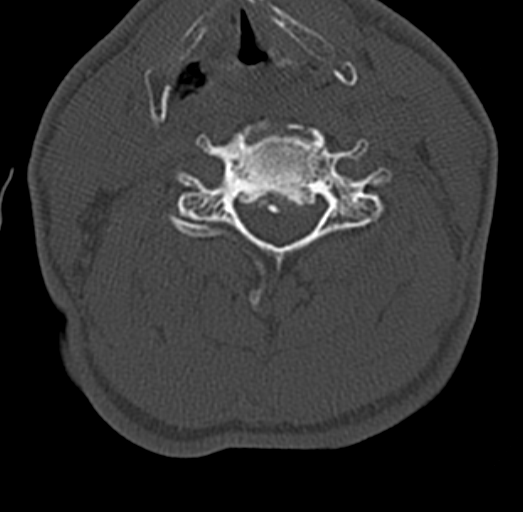
[im 71/106  brain]
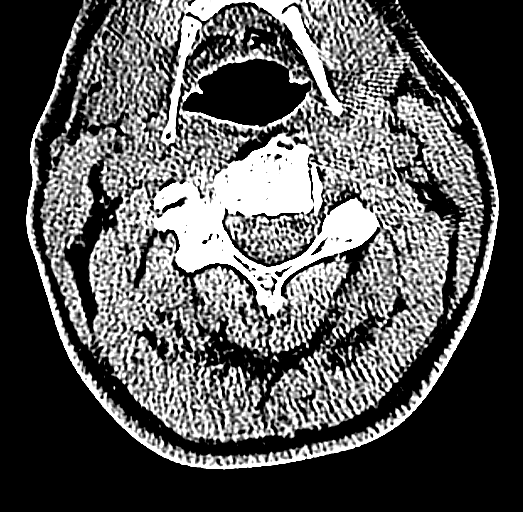
[im 82/106  brain]
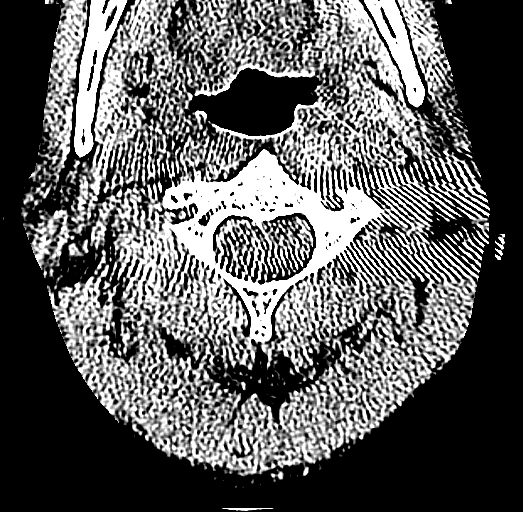
[im 94/106  brain]
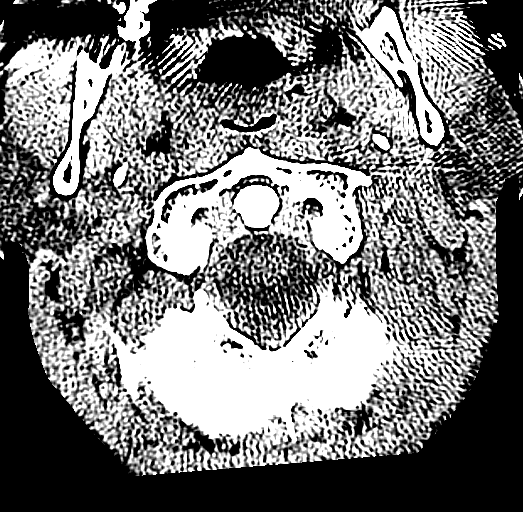

[14 of 47 positions shown; findings below may reference images not displayed]

FINDINGS: CT HEAD FINDINGS

No acute intracranial abnormality. Specifically, no hemorrhage,
hydrocephalus, mass lesion, acute infarction, or significant
intracranial injury. No acute calvarial abnormality. Visualized
paranasal sinuses and mastoids clear. Orbital soft tissues
unremarkable.

CT CERVICAL SPINE FINDINGS

Diffuse degenerative disc and facet disease throughout the cervical
spine. Prevertebral soft tissues are normal. Alignment is normal. No
fracture. No epidural or paraspinal hematoma.
IMPRESSION: No acute intracranial abnormality.

Degenerative changes in the cervical spine. No acute bony
abnormality.

## 2016-08-24 ENCOUNTER — Encounter (INDEPENDENT_AMBULATORY_CARE_PROVIDER_SITE_OTHER): Payer: Self-pay

## 2016-08-24 ENCOUNTER — Ambulatory Visit: Payer: Medicaid Other | Admitting: Pharmacist

## 2016-08-24 ENCOUNTER — Encounter: Payer: Self-pay | Admitting: Pharmacist

## 2016-08-24 VITALS — BP 122/80 | Ht 65.0 in | Wt 147.0 lb

## 2016-08-24 DIAGNOSIS — Z79899 Other long term (current) drug therapy: Secondary | ICD-10-CM

## 2016-08-24 NOTE — Patient Instructions (Signed)
Ask about amlodipine refill.  Ask about dose of gabapentin  Ask about dental and eye exam through [facility]

## 2016-08-24 NOTE — Progress Notes (Signed)
Medication Management Clinic Visit Note  Patient: Alvin Gonzalez. MRN: [id number] Date of Birth: [birth year] PCP: System, Provider Not In   [name]. 56 y.o. male presents for his annual medication therapy management review with the pharmacist today. He was accompanied by his girlfriend. He has applied for disability; status pending.  BP 122/80 (BP Location: Left Arm, Patient Position: Sitting, Cuff Size: Normal)   Ht 5\' 5"  (1.651 m)   Wt 147 lb (66.7 kg)   BMI 24.46 kg/m   Patient Information   Past Medical History:  Diagnosis Date  . Anxiety   . Chronic back pain   . Depression   . Depression   . Hypertension   . Intermittent explosive disorder   . Suicide attempt ([place])    prior to 02/18/14 admission   . Tobacco abuse       Past Surgical History:  Procedure Laterality Date  . "broken back"    . LYMPH NODE BIOPSY      History reviewed. No pertinent family history.  New Diagnoses (since last visit):   Family Support: Good  Lifestyle Diet: Breakfast: Lunch: Sandwich Dinner: Drinks:            History  Alcohol Use No      History  Smoking Status  . Current Every Day Smoker  . Packs/day: 0.50  . Types: Cigarettes  Smokeless Tobacco  . Former Systems developer  . Quit date: 01/30/2015    Comment: Patient has cut down on total daily cig use      Health Maintenance  Topic Date Due  . Hepatitis C Screening  December 09, 1960  . HIV Screening  05/19/1977  . TETANUS/TDAP  05/19/1981  . COLONOSCOPY  05/19/2012  . INFLUENZA VACCINE  09/09/2016    Prior to Admission medications   Medication Sig Start Date End Date Taking? Authorizing Provider  acetaminophen (TYLENOL) 500 MG tablet Take 500 mg by mouth as needed. 6 per day   Yes [provider]  amitriptyline (ELAVIL) 50 MG tablet Take 100 mg by mouth at bedtime.   Yes [provider]  diazepam (VALIUM) 2 MG tablet Take 2 mg by mouth 2 (two) times daily.   Yes [provider]   gabapentin (NEURONTIN) 600 MG tablet Take 900 mg by mouth 3 (three) times daily.   Yes [provider]  hydrochlorothiazide (HYDRODIURIL) 25 MG tablet Take 25 mg by mouth daily.   Yes [provider]  lisinopril (PRINIVIL,ZESTRIL) 20 MG tablet Take 20 mg by mouth daily.   Yes [provider]  nicotine (NICOTROL) 10 MG inhaler Inhale 1 continuous puffing into the lungs as needed for smoking cessation. Using 6-7 cartridges per day   Yes [provider]  amLODipine (NORVASC) 10 MG tablet Take 10 mg by mouth daily.    [provider]  asenapine (SAPHRIS) 5 MG SUBL 24 hr tablet Place 5 mg under the tongue at bedtime.    [provider]    Health Maintenance/Date Completed  Last Visit to PCP: 07/27/16 ([place], [address])  Next Visit to PCP: 08/26/16 Specialist Visit: 09/07/16 [facility] Dental Exam: No recent Eye Exam: No recent Prostate Exam: ? Pelvic/PAP Exam: N/A Mammogram: N/A DEXA:  Colonoscopy: yes Flu Vaccine: 2017 Pneumonia Vaccine: yes  ASSESSMENT:  Compliance and Medication Access: Patient states his girlfriend takes care of all his medications. She uses a pill box. He ran out of amlodipine and will ask his primary care  provider to renew the prescription on 08-26-16. Has not started Saphris; on order through patient assistance.  Weight Loss: States he has lost a lot of weight over the last 2 months, although he has a good appetite. Today he weighed 147 lbs.  He is seeing a nutritionist at [facility]  Wt Readings from Last 5 Encounters:  08/17/16 65.4 kg (144 lb 3.2 oz)  07/28/16 65.9 kg (145 lb 3.2 oz)  06/18/16 66.4 kg (146 lb 6.4 oz)  06/08/16 68.1 kg (150 lb 3.2 oz)  05/18/16 70.3 kg (155 lb)    HTN: Currently taking lisinopril and HCTZ. Patient ran out of amlodipine. BP today was 122/80 mmHg.  Mood: Currently taking amitriptyline. Prescribed Saphris; on order through patient assistance.  Smoking  Cessation: Smoking about a half pack per day. Currently using Nicotrol inhalers (6-7 per day). Explained to the patient that he should not be smoking when he is using the Nicotrol. States that he alternates the cigarette and Nicotrol. Reviewed smoking cessation options. Also educated patient about the 1-800-QUIT-NOW line to access patches.   Pain: Patient has a hx of a 40 ft fall and fracture Dec 2016. Per out records, using acetaminophen, diazepam and gabapentin. Patient's girlfriend is concerned about the high dose of gabapentin. Continues to have pain in back, hip, shoulders and legs- especially on the right side of his body. He is receiving PT Aquatic Therapy and followed by [facility]. He has also seen Sports Medicine as well.   PLAN: Pharmacist to call [facility] to see if there is an alternative medication to bridge until Saphris available. Follow up in 3 months with the pharmacists Follow up with primary care provider 08-26-16; encouraged patient to ask about amlodipine refill and dose of gabapentin. Encouraged patient to inquire about a dental and vision exam through [facility].  Adeoluwa Silvers K. Dicky Doe, PharmD Medication [facility] [place] Operations Coordinator [phone]

## 2016-10-05 ENCOUNTER — Telehealth: Payer: Self-pay | Admitting: Pharmacist

## 2016-10-05 NOTE — Telephone Encounter (Signed)
10/05/16 I received [employer] dated 09/22/16 where we received 4 boxes of Nicotrol Nasal Spray, should have been Nicotrol Inhaler Cartridges for (6). Today I called [facility] spoke with Kalman Shan, she reviewed made correction again on the product and has processed order# [id number] for (6) additional boxes to complete the order, allow 7-10 days to receive. Delos Haring

## 2016-10-28 DIAGNOSIS — Z9189 Other specified personal risk factors, not elsewhere classified: Secondary | ICD-10-CM | POA: Insufficient documentation

## 2016-11-01 ENCOUNTER — Encounter: Payer: Self-pay | Admitting: Emergency Medicine

## 2016-11-01 ENCOUNTER — Emergency Department
Admission: EM | Admit: 2016-11-01 | Discharge: 2016-11-01 | Disposition: A | Discharge status: Home / Self Care | Payer: Self-pay | Attending: Emergency Medicine | Admitting: Emergency Medicine

## 2016-11-01 DIAGNOSIS — F329 Major depressive disorder, single episode, unspecified: Secondary | ICD-10-CM | POA: Insufficient documentation

## 2016-11-01 DIAGNOSIS — F1210 Cannabis abuse, uncomplicated: Secondary | ICD-10-CM | POA: Insufficient documentation

## 2016-11-01 DIAGNOSIS — F1410 Cocaine abuse, uncomplicated: Secondary | ICD-10-CM | POA: Insufficient documentation

## 2016-11-01 DIAGNOSIS — F17210 Nicotine dependence, cigarettes, uncomplicated: Secondary | ICD-10-CM | POA: Insufficient documentation

## 2016-11-01 DIAGNOSIS — G8929 Other chronic pain: Secondary | ICD-10-CM | POA: Insufficient documentation

## 2016-11-01 DIAGNOSIS — F32A Depression, unspecified: Secondary | ICD-10-CM

## 2016-11-01 DIAGNOSIS — Z79899 Other long term (current) drug therapy: Secondary | ICD-10-CM | POA: Insufficient documentation

## 2016-11-01 DIAGNOSIS — I10 Essential (primary) hypertension: Secondary | ICD-10-CM | POA: Insufficient documentation

## 2016-11-01 LAB — COMPREHENSIVE METABOLIC PANEL
ALBUMIN: 4.4 g/dL (ref 3.5–5.0)
ALT: 43 U/L (ref 17–63)
AST: 38 U/L (ref 15–41)
Alkaline Phosphatase: 97 U/L (ref 38–126)
Anion gap: 9 (ref 5–15)
BUN: 17 mg/dL (ref 6–20)
CHLORIDE: 104 mmol/L (ref 101–111)
CO2: 25 mmol/L (ref 22–32)
Calcium: 9.7 mg/dL (ref 8.9–10.3)
Creatinine, Ser: 0.91 mg/dL (ref 0.61–1.24)
GFR calc Af Amer: >60 mL/min (ref >60)
GFR calc non Af Amer: >60 mL/min (ref >60)
GLUCOSE: 102 mg/dL — AB (ref 65–99)
POTASSIUM: 3.7 mmol/L (ref 3.5–5.1)
Sodium: 138 mmol/L (ref 135–145)
Total Bilirubin: 0.6 mg/dL (ref 0.3–1.2)
Total Protein: 7.8 g/dL (ref 6.5–8.1)

## 2016-11-01 LAB — ACETAMINOPHEN LEVEL: Acetaminophen (Tylenol), Serum: <10 ug/mL — ABNORMAL LOW (ref 10–30)

## 2016-11-01 LAB — CBC
HEMATOCRIT: 43.8 % (ref 40.0–52.0)
Hemoglobin: 15.4 g/dL (ref 13.0–18.0)
MCH: 29.7 pg (ref 26.0–34.0)
MCHC: 35.3 g/dL (ref 32.0–36.0)
MCV: 84.1 fL (ref 80.0–100.0)
PLATELETS: 319 10*3/uL (ref 150–440)
RBC: 5.2 MIL/uL (ref 4.40–5.90)
RDW: 14.4 % (ref 11.5–14.5)
WBC: 7.2 10*3/uL (ref 3.8–10.6)

## 2016-11-01 LAB — URINE DRUG SCREEN, QUALITATIVE (ARMC ONLY)
AMPHETAMINES, UR SCREEN: NOT DETECTED
Barbiturates, Ur Screen: NOT DETECTED
Benzodiazepine, Ur Scrn: POSITIVE — AB
COCAINE METABOLITE, UR ~~LOC~~: POSITIVE — AB
Cannabinoid 50 Ng, Ur ~~LOC~~: POSITIVE — AB
MDMA (ECSTASY) UR SCREEN: NOT DETECTED
Methadone Scn, Ur: NOT DETECTED
Opiate, Ur Screen: NOT DETECTED
PHENCYCLIDINE (PCP) UR S: NOT DETECTED
Tricyclic, Ur Screen: POSITIVE — AB

## 2016-11-01 LAB — ETHANOL

## 2016-11-01 LAB — SALICYLATE LEVEL: Salicylate Lvl: <7 mg/dL (ref 2.8–30.0)

## 2016-11-01 NOTE — ED Triage Notes (Signed)
Patient presents to ED. Patient is homeless. Patient states, "I am trying to get into a homeless shelter but I need to get cleared". Patient states, "I want to go to sleep and never wake up." Denies any certain plan. Denies HI. Cooperative in triage.

## 2016-11-01 NOTE — ED Provider Notes (Signed)
[facility] Emergency Department Provider Note  ____________________________________________   First MD Initiated Contact with Patient 11/01/16 1547     (approximate)  I have reviewed the triage vital signs and the nursing notes.   HISTORY  Chief Complaint Psychiatric Evaluation   HPI Alvin Liz. is a 56 y.o. male who comes to the emergency department requesting "to get cleared" so she can go to a homeless shelter. He has a past medical history of hypertension as well as depression and he does follow with his primary care physician as well as a psychiatrist. He said that he thinks of dying every day, however he currently does not want to die. He does say that "in 10 minutes he never know what all be thinking". He has attempted suicide in the past. His symptoms have been insidious in onset and constant. Nothing in particular seems to make it better or worse.   Past Medical History:  Diagnosis Date  . Anxiety   . Chronic back pain   . Depression   . Depression   . Hypertension   . Intermittent explosive disorder   . Suicide attempt ([place])    prior to 02/18/14 admission   . Tobacco abuse     Patient Active Problem List   Diagnosis Date Noted  . Overdose 02/18/2014  . Hypokalemia 02/18/2014  . Suicide attempt ([place]) 02/18/2014  . Respiratory failure ([place]) 02/18/2014  . Hypotension 02/18/2014  . Tachycardia 02/18/2014  . AKI (acute kidney injury) ([place]) 02/18/2014  . Leukocytosis 02/18/2014  . Acute encephalopathy 02/18/2014    Past Surgical History:  Procedure Laterality Date  . "broken back"    . LYMPH NODE BIOPSY      Prior to Admission medications   Medication Sig Start Date End Date Taking? Authorizing Provider  acetaminophen (TYLENOL) 500 MG tablet Take 500 mg by mouth as needed. 6 per day    [provider]  amitriptyline (ELAVIL) 50 MG tablet Take 100 mg by mouth at bedtime.    [provider]  amLODipine  (NORVASC) 10 MG tablet Take 10 mg by mouth daily.    [provider]  asenapine (SAPHRIS) 5 MG SUBL 24 hr tablet Place 5 mg under the tongue at bedtime.    [provider]  diazepam (VALIUM) 2 MG tablet Take 2 mg by mouth 2 (two) times daily.    [provider]  gabapentin (NEURONTIN) 600 MG tablet Take 900 mg by mouth 3 (three) times daily.    [provider]  hydrochlorothiazide (HYDRODIURIL) 25 MG tablet Take 25 mg by mouth daily.    [provider]  lisinopril (PRINIVIL,ZESTRIL) 20 MG tablet Take 20 mg by mouth daily.    [provider]  nicotine (NICOTROL) 10 MG inhaler Inhale 1 continuous puffing into the lungs as needed for smoking cessation. Using 6-7 cartridges per day    [provider]    Allergies Ibuprofen; Risperdal [risperidone]; Naproxen; and Tegretol [carbamazepine]  No family history on file.  Social History Social History  Substance Use Topics  . Smoking status: Current Every Day Smoker    Packs/day: 0.50    Types: Cigarettes  . Smokeless tobacco: Former Systems developer    Quit date: 01/30/2015     Comment: Patient has cut down on total daily cig use  . Alcohol use No    Review of Systems Constitutional: No fever/chills Eyes: No visual changes. ENT: No sore throat. Cardiovascular: Denies chest pain. Respiratory: Denies shortness  of breath. Gastrointestinal: No abdominal pain.  No nausea, no vomiting.  No diarrhea.  No constipation. Genitourinary: Negative for dysuria. Musculoskeletal: Negative for back pain. Skin: Negative for rash. Neurological: Negative for headaches, focal weakness or numbness.   ____________________________________________   PHYSICAL EXAM:  VITAL SIGNS: ED Triage Vitals [11/01/16 1414]  Enc Vitals Group     BP (!) 142/93     Pulse Rate 96     Resp 18     Temp 98.2 F (36.8 C)     Temp Source Oral     SpO2 96 %     Weight      Height      Head Circumference      Peak  Flow      Pain Score      Pain Loc      Pain Edu?      Excl. in [place]?     Constitutional: alert and oriented 4 in no acute distress appears somewhat disheveled Eyes: PERRL EOMI. Head: Atraumatic. Nose: No congestion/rhinnorhea. Mouth/Throat: No trismus Neck: No stridor.   Cardiovascular: Normal rate, regular rhythm. Grossly normal heart sounds.  Good peripheral circulation. Respiratory: Normal respiratory effort.  No retractions. Lungs CTAB and moving good air Gastrointestinal: soft nontender Musculoskeletal: No lower extremity edema   Neurologic:  Normal speech and language. No gross focal neurologic deficits are appreciated. Skin:  Skin is warm, dry and intact. No rash noted. Psychiatric: pleasant with a somewhat depressed affect    ____________________________________________   DIFFERENTIAL includes but not limited to  depression, suicidality, hypertension, metabolic drainage ____________________________________________   LABS (all labs ordered are listed, but only abnormal results are displayed)  Labs Reviewed  COMPREHENSIVE METABOLIC PANEL - Abnormal; Notable for the following:       Result Value   Glucose, Bld 102 (*)    All other components within normal limits  ACETAMINOPHEN LEVEL - Abnormal; Notable for the following:    Acetaminophen (Tylenol), Serum <10 (*)    All other components within normal limits  URINE DRUG SCREEN, QUALITATIVE ([facility] ONLY) - Abnormal; Notable for the following:    Tricyclic, Ur Screen POSITIVE (*)    Cocaine Metabolite,Ur [place] POSITIVE (*)    Cannabinoid 50 Ng, Ur [place] POSITIVE (*)    Benzodiazepine, Ur Scrn POSITIVE (*)    All other components within normal limits  ETHANOL  SALICYLATE LEVEL  CBC    laboratory testing reviewed by me which reveals multiple positives on his drug screen including benzodiazepines, cocaine, cannabinoids, and  tricyclics __________________________________________  EKG   ____________________________________________  RADIOLOGY   ____________________________________________   PROCEDURES  Procedure(s) performed: no  Procedures  Critical Care performed: no  Observation: no ____________________________________________   INITIAL IMPRESSION / ASSESSMENT AND PLAN / ED COURSE  Pertinent labs & imaging results that were available during my care of the patient were reviewed by me and considered in my medical decision making (see chart for details).  The patient arrives with normal vital signs pleasant cooperative although he does say that he is not sure whether or not he would kill himself in the near future. At this point he agrees to stay and speak with psychiatry and he will remain voluntary.     ----------------------------------------- 10:11 PM on 11/01/2016 -----------------------------------------  Psychiatry specialist on-call doctor [place] makes the following recommendations:  Discharge-voluntary  1. The patient is psychiatrically cleared for discharge to establish outpatient services as detailed in the above consultation. 2. Medication recommendations: Deferred to outpatient provider.  The patient does not meet criteria for involuntary commitment. ____________________________________________   FINAL CLINICAL IMPRESSION(S) / ED DIAGNOSES  Final diagnoses:  Depression, unspecified depression type  Hypertension, unspecified type  Cocaine abuse  Marijuana abuse      NEW MEDICATIONS STARTED DURING THIS VISIT:  Discharge Medication List as of 11/01/2016 10:14 PM       Note:  This document was prepared using Dragon voice recognition software and may include unintentional dictation errors.     Darel Hong, MD 11/02/16 1420

## 2016-11-01 NOTE — Discharge Instructions (Signed)
Medically speaking it is safe for you to go to the homeless shelter.  Please continue taking all of your home medications as prescribed and follow-up with your psychiatrist as scheduled. Return to the emergency department for any concerns.  It was a pleasure to take care of you today, and thank you for coming to our emergency department.  If you have any questions or concerns before leaving please ask the nurse to grab me and I'm more than happy to go through your aftercare instructions again.  If you were prescribed any opioid pain medication today such as Norco, Vicodin, Percocet, morphine, hydrocodone, or oxycodone please make sure you do not drive when you are taking this medication as it can alter your ability to drive safely.  If you have any concerns once you are home that you are not improving or are in fact getting worse before you can make it to your follow-up appointment, please do not hesitate to call 911 and come back for further evaluation.  Darel Hong, MD  Results for orders placed or performed during the hospital encounter of 11/01/16  Comprehensive metabolic panel  Result Value Ref Range   Sodium 138 135 - 145 mmol/L   Potassium 3.7 3.5 - 5.1 mmol/L   Chloride 104 101 - 111 mmol/L   CO2 25 22 - 32 mmol/L   Glucose, Bld 102 (H) 65 - 99 mg/dL   BUN 17 6 - 20 mg/dL   Creatinine, Ser 0.91 0.61 - 1.24 mg/dL   Calcium 9.7 8.9 - 10.3 mg/dL   Total Protein 7.8 6.5 - 8.1 g/dL   Albumin 4.4 3.5 - 5.0 g/dL   AST 38 15 - 41 U/L   ALT 43 17 - 63 U/L   Alkaline Phosphatase 97 38 - 126 U/L   Total Bilirubin 0.6 0.3 - 1.2 mg/dL   GFR calc non Af Amer >60 >60 mL/min   GFR calc Af Amer >60 >60 mL/min   Anion gap 9 5 - 15  Ethanol  Result Value Ref Range   Alcohol, Ethyl (B) <5 <5 mg/dL  Salicylate level  Result Value Ref Range   Salicylate Lvl <2.2 2.8 - 30.0 mg/dL  Acetaminophen level  Result Value Ref Range   Acetaminophen (Tylenol), Serum <10 (L) 10 - 30 ug/mL  cbc    Result Value Ref Range   WBC 7.2 3.8 - 10.6 K/uL   RBC 5.20 4.40 - 5.90 MIL/uL   Hemoglobin 15.4 13.0 - 18.0 g/dL   HCT 43.8 40.0 - 52.0 %   MCV 84.1 80.0 - 100.0 fL   MCH 29.7 26.0 - 34.0 pg   MCHC 35.3 32.0 - 36.0 g/dL   RDW 14.4 11.5 - 14.5 %   Platelets 319 150 - 440 K/uL  Urine Drug Screen, Qualitative  Result Value Ref Range   Tricyclic, Ur Screen POSITIVE (A) NONE DETECTED   Amphetamines, Ur Screen NONE DETECTED NONE DETECTED   MDMA (Ecstasy)Ur Screen NONE DETECTED NONE DETECTED   Cocaine Metabolite,Ur [place] POSITIVE (A) NONE DETECTED   Opiate, Ur Screen NONE DETECTED NONE DETECTED   Phencyclidine (PCP) Ur S NONE DETECTED NONE DETECTED   Cannabinoid 50 Ng, Ur  POSITIVE (A) NONE DETECTED   Barbiturates, Ur Screen NONE DETECTED NONE DETECTED   Benzodiazepine, Ur Scrn POSITIVE (A) NONE DETECTED   Methadone Scn, Ur NONE DETECTED NONE DETECTED

## 2016-11-01 NOTE — ED Notes (Signed)
VOL/ PENDING SOC CONSULT

## 2016-11-01 NOTE — ED Notes (Signed)
Gave pt ginger-ale, offered [place] tray, pt refused food at this time. AS
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
## 2016-11-01 NOTE — ED Notes (Signed)
VOL/PENDING PSYCH CONSULT °

## 2016-12-10 DIAGNOSIS — R911 Solitary pulmonary nodule: Secondary | ICD-10-CM | POA: Insufficient documentation

## 2016-12-10 HISTORY — DX: Solitary pulmonary nodule: R91.1

## 2016-12-29 ENCOUNTER — Telehealth: Payer: Self-pay | Admitting: Pharmacist

## 2016-12-29 NOTE — Telephone Encounter (Signed)
12/29/16 Called [employer] spoke with Gaspar Garbe and placed refill for Nicotrol Inhaler Cartridge -allow 7-10 business days to receive, order# [id number].Delos Haring

## 2017-01-25 DIAGNOSIS — M62838 Other muscle spasm: Secondary | ICD-10-CM | POA: Insufficient documentation

## 2017-02-16 DIAGNOSIS — R062 Wheezing: Secondary | ICD-10-CM | POA: Insufficient documentation

## 2017-02-17 ENCOUNTER — Telehealth: Payer: Self-pay | Admitting: Pharmacist

## 2017-02-17 NOTE — Telephone Encounter (Signed)
02/17/17 Called [employer] spoke with Francesca Jewett to place refill on Nicotrol Inhaler 10mg , allow 7-10 business days to receive, order# [id number], 3 refills left. Patient enrolled till 08/18/17.Delos Haring

## 2017-03-01 ENCOUNTER — Other Ambulatory Visit: Payer: Self-pay

## 2017-03-01 ENCOUNTER — Encounter (INDEPENDENT_AMBULATORY_CARE_PROVIDER_SITE_OTHER): Payer: Self-pay

## 2017-03-01 ENCOUNTER — Ambulatory Visit: Payer: Self-pay

## 2017-03-01 VITALS — BP 122/82 | Ht 66.0 in | Wt 162.0 lb

## 2017-03-01 DIAGNOSIS — Z79899 Other long term (current) drug therapy: Secondary | ICD-10-CM
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
## 2017-03-26 DIAGNOSIS — N529 Male erectile dysfunction, unspecified: Secondary | ICD-10-CM | POA: Insufficient documentation

## 2017-03-26 HISTORY — DX: Male erectile dysfunction, unspecified: N52.9

## 2017-04-16 ENCOUNTER — Telehealth: Payer: Self-pay | Admitting: Pharmacist

## 2017-04-16 NOTE — Telephone Encounter (Signed)
04/16/2017 3:36:33 PM - Nicotrol Inhaler invoice  04/16/17 Received [employer] invoice dated 04/12/17 where we received 1 box of Nicotrol Inhaler for patient, he No Longer wants this medication-makes his throat hurt. Delos Haring

## 2017-12-02 ENCOUNTER — Telehealth: Payer: Self-pay | Admitting: Pharmacy Technician

## 2017-12-02 NOTE — Telephone Encounter (Signed)
Patient failed to provide 2019 poi.  No additional medication assistance will be provided by [facility] without the required proof of income documentation.  Patient notified by letter.  Keyante Durio J. Rutilio Yellowhair Care Manager Medication Management Clinic 

## 2021-01-23 ENCOUNTER — Ambulatory Visit: Payer: Self-pay | Admitting: Obstetrics and Gynecology

## 2021-01-23 ENCOUNTER — Other Ambulatory Visit: Payer: Self-pay

## 2021-01-23 VITALS — BP 158/96 | HR 95 | Temp 97.6°F | Resp 16 | Ht 65.0 in | Wt 168.1 lb

## 2021-01-23 DIAGNOSIS — I10 Essential (primary) hypertension: Secondary | ICD-10-CM

## 2021-01-23 DIAGNOSIS — Z13220 Encounter for screening for lipoid disorders: Secondary | ICD-10-CM

## 2021-01-23 DIAGNOSIS — E782 Mixed hyperlipidemia: Secondary | ICD-10-CM

## 2021-01-23 DIAGNOSIS — M791 Myalgia, unspecified site: Secondary | ICD-10-CM

## 2021-01-23 DIAGNOSIS — Z131 Encounter for screening for diabetes mellitus: Secondary | ICD-10-CM

## 2021-01-23 DIAGNOSIS — F419 Anxiety disorder, unspecified: Secondary | ICD-10-CM

## 2021-01-23 MED ORDER — LISINOPRIL 30 MG PO TABS
30.0000 mg | ORAL_TABLET | Freq: Every day | ORAL | 0 refills | Status: DC
  Filled 2021-01-23: qty 30, 30d supply, fill #0

## 2021-01-23 MED ORDER — CYCLOBENZAPRINE HCL 5 MG PO TABS
5.0000 mg | ORAL_TABLET | Freq: Three times a day (TID) | ORAL | 0 refills | Status: DC | PRN
  Filled 2021-01-23: qty 90, 30d supply, fill #0

## 2021-01-23 MED ORDER — HYDROCHLOROTHIAZIDE 25 MG PO TABS
25.0000 mg | ORAL_TABLET | Freq: Every day | ORAL | 0 refills | Status: DC
  Filled 2021-01-23: qty 30, 30d supply, fill #0

## 2021-01-23 MED ORDER — AMLODIPINE BESYLATE 10 MG PO TABS
10.0000 mg | ORAL_TABLET | Freq: Every day | ORAL | 0 refills | Status: DC
  Filled 2021-01-23: qty 30, 30d supply, fill #0
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
## 2021-01-24 ENCOUNTER — Other Ambulatory Visit: Payer: Self-pay

## 2021-01-24 LAB — COMPREHENSIVE METABOLIC PANEL
ALT: 22 IU/L (ref 0–44)
AST: 20 IU/L (ref 0–40)
Albumin/Globulin Ratio: 1.8 (ref 1.2–2.2)
Albumin: 4.6 g/dL (ref 3.8–4.9)
Alkaline Phosphatase: 126 IU/L — ABNORMAL HIGH (ref 44–121)
BUN/Creatinine Ratio: 14 (ref 9–20)
BUN: 13 mg/dL (ref 6–24)
Bilirubin Total: 0.2 mg/dL (ref 0.0–1.2)
CO2: 25 mmol/L (ref 20–29)
Calcium: 9.6 mg/dL (ref 8.7–10.2)
Chloride: 103 mmol/L (ref 96–106)
Creatinine, Ser: 0.91 mg/dL (ref 0.76–1.27)
Globulin, Total: 2.5 g/dL (ref 1.5–4.5)
Glucose: 132 mg/dL — ABNORMAL HIGH (ref 70–99)
Potassium: 3.9 mmol/L (ref 3.5–5.2)
Sodium: 144 mmol/L (ref 134–144)
Total Protein: 7.1 g/dL (ref 6.0–8.5)
eGFR: 98 mL/min/{1.73_m2} (ref >59)

## 2021-01-24 LAB — LIPID PANEL
Chol/HDL Ratio: 4.8 ratio (ref 0.0–5.0)
Cholesterol, Total: 135 mg/dL (ref 100–199)
HDL: 28 mg/dL — ABNORMAL LOW (ref >39)
LDL Chol Calc (NIH): 72 mg/dL (ref 0–99)
Triglycerides: 212 mg/dL — ABNORMAL HIGH (ref 0–149)
VLDL Cholesterol Cal: 35 mg/dL (ref 5–40)

## 2021-01-24 LAB — HEMOGLOBIN A1C
Est. average glucose Bld gHb Est-mCnc: 123 mg/dL
Hgb A1c MFr Bld: 5.9 % — ABNORMAL HIGH (ref 4.8–5.6)

## 2021-02-12 ENCOUNTER — Ambulatory Visit: Payer: Medicaid Other | Admitting: Pharmacy Technician

## 2021-02-12 DIAGNOSIS — Z79899 Other long term (current) drug therapy: Secondary | ICD-10-CM

## 2021-02-17 ENCOUNTER — Other Ambulatory Visit: Payer: Self-pay

## 2021-02-17 NOTE — Progress Notes (Signed)
Completed [facility] application and contract.  Patient agreed to all terms of the [facility] contract.    Patient approved to receive medication assistance at [facility] until time for re-certification in 7846, and as long as eligibility criteria continues to be met.    Provided patient with Civil engineer, contracting based on his particular needs.    [facility]

## 2021-02-20 ENCOUNTER — Ambulatory Visit: Payer: Self-pay | Admitting: Gerontology

## 2021-02-20 ENCOUNTER — Other Ambulatory Visit: Payer: Self-pay

## 2021-02-20 VITALS — BP 117/80 | HR 88 | Temp 97.9°F | Resp 16 | Ht 65.0 in | Wt 166.3 lb

## 2021-02-20 DIAGNOSIS — R7303 Prediabetes: Secondary | ICD-10-CM

## 2021-02-20 DIAGNOSIS — K219 Gastro-esophageal reflux disease without esophagitis: Secondary | ICD-10-CM

## 2021-02-20 DIAGNOSIS — Z8719 Personal history of other diseases of the digestive system: Secondary | ICD-10-CM | POA: Insufficient documentation

## 2021-02-20 DIAGNOSIS — I9589 Other hypotension: Secondary | ICD-10-CM

## 2021-02-20 DIAGNOSIS — K649 Unspecified hemorrhoids: Secondary | ICD-10-CM

## 2021-02-20 DIAGNOSIS — I10 Essential (primary) hypertension: Secondary | ICD-10-CM

## 2021-02-20 HISTORY — DX: Prediabetes: R73.03

## 2021-02-20 MED ORDER — HYDROCHLOROTHIAZIDE 25 MG PO TABS
25.0000 mg | ORAL_TABLET | Freq: Every day | ORAL | 0 refills | Status: DC
  Filled 2021-02-20: qty 30, 30d supply, fill #0

## 2021-02-20 MED ORDER — AMLODIPINE BESYLATE 10 MG PO TABS
10.0000 mg | ORAL_TABLET | Freq: Every day | ORAL | 0 refills | Status: DC
  Filled 2021-02-20: qty 30, 30d supply, fill #0

## 2021-02-20 MED ORDER — LISINOPRIL 30 MG PO TABS
30.0000 mg | ORAL_TABLET | Freq: Every day | ORAL | 2 refills | Status: DC
  Filled 2021-02-20: qty 30, 30d supply, fill #0
  Filled 2021-03-21: qty 30, 30d supply, fill #1
  Filled 2021-04-22: qty 30, 30d supply, fill #2

## 2021-02-20 MED ORDER — PANTOPRAZOLE SODIUM 40 MG PO TBEC
40.0000 mg | DELAYED_RELEASE_TABLET | Freq: Every day | ORAL | 0 refills | Status: DC
  Filled 2021-02-20: qty 30, 30d supply, fill #0

## 2021-02-20 NOTE — Patient Instructions (Signed)

## 2021-02-20 NOTE — Progress Notes (Signed)
Established Patient Office Visit  Subjective:  Patient ID: Alvin Elzey., male    DOB: Feb 21, 1960  Age: 61 y.o. MRN: [id number]  CC:  Chief Complaint  Patient presents with   Follow-up    Labs drawn 01/23/21   Hypertension    HPI Alvin Ota. Is a 61 y/o male who has history of Anxiety, Depression,Hypertension, Chronic back pain presents for lab review and medication refill. His HgbA1c done on 01/23/21 was 5.9%, Triglycerides was 212 mg/dl, HDL was 28 mg/dl. He has a history of hemmorhoid, he states that it prolapses after lifting heavy objects and was told at [facility] that he needs surgery. He states that he fell 40 feet and he experiences chronic pain and taking 100 mg of Amytriptyline moderately relieves symptoms and his insomnia. He states that he has not taking Amytriptyline for 4 years because he was incarcerated. He c/o acid reflux that has been going on for 3 months and taking otc Tums minimally relieve symptoms. He states that his mood is good, will follow up [facility], and he offers no further complaint.   Past Medical History:  Diagnosis Date   Anxiety    Chronic back pain    Depression    Depression    Hypertension    Intermittent explosive disorder    Suicide attempt ([place])    prior to 02/18/14 admission    Tobacco abuse     Past Surgical History:  Procedure Laterality Date   "broken back"     CHEST TUBE INSERTION     LYMPH NODE BIOPSY      Family History  Problem Relation Age of Onset   Diabetes Mother    Hypertension Mother    Heart disease Father    Hypertension Father    Cirrhosis Father    Cancer Sister    Heart attack Maternal Grandmother    Suicidality Maternal Grandfather    Other Paternal Grandmother        unknown medical history   Suicidality Paternal Grandfather     Social History   Socioeconomic History   Marital status: Legally Separated    Spouse name: Not on file   Number of children: Not on file    Years of education: Not on file   Highest education level: Not on file  Occupational History   Not on file  Tobacco Use   Smoking status: Every Day    Packs/day: 0.50    Years: 45.00    Pack years: 22.50    Types: Cigarettes   Smokeless tobacco: Former    Quit date: 01/30/2015  Vaping Use   Vaping Use: Former   Quit date: 01/23/2017  Substance and Sexual Activity   Alcohol use: Not Currently    Comment: 1-2 beers per week, last use 2018   Drug use: Not Currently    Types: Marijuana    Comment: last use 2018   Sexual activity: Yes  Other Topics Concern   Not on file  Social History Narrative   Not on file   Social Determinants of Health   Financial Resource Strain: Not on file  Food Insecurity: No Food Insecurity   Worried About Running Out of Food in the Last Year: Never true   [employer] in the Last Year: Never true  Transportation Needs: No Transportation Needs   Lack of Transportation (Medical): No   Lack of Transportation (Non-Medical): No  Physical Activity: Not on file  Stress:  Not on file  Social Connections: Not on file  Intimate Partner Violence: Not on file    Outpatient Medications Prior to Visit  Medication Sig Dispense Refill   acetaminophen (TYLENOL) 500 MG tablet Take 500 mg by mouth as needed. 6 per day     cyclobenzaprine (FLEXERIL) 5 MG tablet Take 1 tablet (5 mg total) by mouth 3 (three) times daily as needed for muscle spasms. 90 tablet 0   amLODipine (NORVASC) 10 MG tablet Take 1 tablet (10 mg total) by mouth once daily. 30 tablet 0   hydrochlorothiazide (HYDRODIURIL) 25 MG tablet Take 1 tablet (25 mg total) by mouth once daily. 30 tablet 0   lisinopril (ZESTRIL) 30 MG tablet Take 1 tablet (30 mg total) by mouth once daily. 30 tablet 0   amitriptyline (ELAVIL) 50 MG tablet Take 100 mg by mouth at bedtime. (Patient not taking: Reported on 01/23/2021)     aspirin 81 MG chewable tablet Chew 81 mg by mouth daily. (Patient not taking: Reported on  01/23/2021)     atorvastatin (LIPITOR) 10 MG tablet Take 10 mg by mouth daily. (Patient not taking: Reported on 02/20/2021)     diazepam (VALIUM) 2 MG tablet Take 2 mg by mouth 2 (two) times daily. (Patient not taking: Reported on 01/23/2021)     docusate sodium (COLACE) 100 MG capsule Take 100 mg by mouth daily. (Patient not taking: Reported on 01/23/2021)     gabapentin (NEURONTIN) 300 MG capsule Take 300 mg by mouth daily.  (Patient not taking: Reported on 01/23/2021)     OLANZapine (ZYPREXA) 5 MG tablet Take 5 mg by mouth 2 (two) times daily. (Patient not taking: Reported on 01/23/2021)     oxyCODONE (OXY IR/ROXICODONE) 5 MG immediate release tablet Take 5 mg by mouth every 6 (six) hours as needed for severe pain. (Patient not taking: Reported on 01/23/2021)     sertraline (ZOLOFT) 100 MG tablet Take 100 mg by mouth daily. (Patient not taking: Reported on 01/23/2021)     No facility-administered medications prior to visit.    Allergies  Allergen Reactions   Carbamazepine Rash and Hives   Ibuprofen Nausea And Vomiting and Rash    GI Bleeding    Risperidone Swelling    Tongue swelling Tongue swelling Tongue swelling Tongue swelling? Pt self report   Naproxen Other (See Comments)    stomach bleeding stomach bleeding stomach bleeding    ROS Review of Systems  Constitutional: Negative.   Eyes: Negative.   Respiratory: Negative.    Cardiovascular: Negative.   Gastrointestinal:  Negative for blood in stool and rectal pain.  Endocrine: Negative.   Skin: Negative.   Neurological: Negative.   Psychiatric/Behavioral: Negative.       Objective:    Physical Exam HENT:     Head: Normocephalic and atraumatic.  Eyes:     Extraocular Movements: Extraocular movements intact.     Conjunctiva/sclera: Conjunctivae normal.     Pupils: Pupils are equal, round, and reactive to light.  Cardiovascular:     Rate and Rhythm: Normal rate and regular rhythm.     Pulses: Normal pulses.      Heart sounds: Normal heart sounds.  Pulmonary:     Effort: Pulmonary effort is normal.     Breath sounds: Normal breath sounds.  Abdominal:     General: Bowel sounds are normal.     Palpations: Abdomen is soft.  Neurological:     General: No focal deficit present.  Mental Status: He is alert and oriented to person, place, and time. Mental status is at baseline.  Psychiatric:        Mood and Affect: Mood normal.        Behavior: Behavior normal.        Thought Content: Thought content normal.        Judgment: Judgment normal.    BP 117/80 (BP Location: Left Arm, Patient Position: Sitting, Cuff Size: Large)    Pulse 88    Temp 97.9 F (36.6 C) (Oral)    Resp 16    Ht _0  (1.651 m)    Wt 166 lb 4.8 oz (75.4 kg)    SpO2 94%    BMI 27.67 kg/m  Wt Readings from Last 3 Encounters:  02/20/21 166 lb 4.8 oz (75.4 kg)  01/23/21 168 lb 1.6 oz (76.2 kg)  03/01/17 162 lb (73.5 kg)     Health Maintenance Due  Topic Date Due   COVID-19 Vaccine (1) Never done   Pneumococcal Vaccine 39-37 Years old (1 - PCV) Never done   HIV Screening  Never done   Hepatitis C Screening  Never done   TETANUS/TDAP  Never done   Zoster Vaccines- Shingrix (1 of 2) Never done   COLONOSCOPY (Pts 45-27yr Insurance coverage will need to be confirmed)  Never done   INFLUENZA VACCINE  09/09/2020    There are no preventive care reminders to display for this patient.  No results found for: TSH Lab Results  Component Value Date   WBC 7.2 11/01/2016   HGB 15.4 11/01/2016   HCT 43.8 11/01/2016   MCV 84.1 11/01/2016   PLT 319 11/01/2016   Lab Results  Component Value Date   NA 144 01/23/2021   K 3.9 01/23/2021   CO2 25 01/23/2021   GLUCOSE 132 (H) 01/23/2021   BUN 13 01/23/2021   CREATININE 0.91 01/23/2021   BILITOT 0.2 01/23/2021   ALKPHOS 126 (H) 01/23/2021   AST 20 01/23/2021   ALT 22 01/23/2021   PROT 7.1 01/23/2021   ALBUMIN 4.6 01/23/2021   CALCIUM 9.6 01/23/2021   ANIONGAP 9 11/01/2016    EGFR 98 01/23/2021   Lab Results  Component Value Date   CHOL 135 01/23/2021   Lab Results  Component Value Date   HDL 28 (L) 01/23/2021   Lab Results  Component Value Date   LDLCALC 72 01/23/2021   Lab Results  Component Value Date   TRIG 212 (H) 01/23/2021   Lab Results  Component Value Date   CHOLHDL 4.8 01/23/2021   Lab Results  Component Value Date   HGBA1C 5.9 (H) 01/23/2021      Assessment & Plan:   1. Essential hypertension - His blood pressure is under control, he will continue on current medication, DASH diet and exercise as tolerated. - amLODipine (NORVASC) 10 MG tablet; Take 1 tablet (10 mg total) by mouth once daily.  Dispense: 30 tablet; Refill: 0 - lisinopril (ZESTRIL) 30 MG tablet; TAKE 1 TABLET BY MOUTH ONCE DAILY.  Dispense: 30 tablet; Refill: 2 - hydrochlorothiazide (HYDRODIURIL) 25 MG tablet; TAKE 1 TABLET BY MOUTH ONCE DAILY.  Dispense: 30 tablet; Refill: 0  2 Gastroesophageal reflux disease without esophagitis - He was started on Protonix, educated on medication side effects and advised to notify clinic. -Avoid spicy, fatty and fried food -Avoid sodas and sour juices -Avoid heavy meals -Avoid eating 4 hours before bedtime -Elevate head of bed at night - pantoprazole (PROTONIX) 40  MG tablet; Take 1 tablet (40 mg total) by mouth once daily.  Dispense: 30 tablet; Refill: 0  3. Hemorrhoids, unspecified hemorrhoid type - He was encouraged to complete [facility] financial application for - Ambulatory referral to Gastroenterology  4. Prediabetes - His HgbA1c was 5.9%, he declines Metformin therapy, stated that he will modify his diet. He was encouraged to continue on low carb/non concentrated sweet diet.     Follow-up: Return in about 4 weeks (around 03/20/2021), or if symptoms worsen or fail to improve.    Alvin Canepa Jerold Coombe, NP

## 2021-02-21 ENCOUNTER — Other Ambulatory Visit: Payer: Self-pay

## 2021-02-26 ENCOUNTER — Telehealth: Payer: Self-pay

## 2021-02-26 NOTE — Telephone Encounter (Signed)
Called no answer no way to leave a message

## 2021-02-27 ENCOUNTER — Telehealth: Payer: Self-pay

## 2021-02-27 NOTE — Telephone Encounter (Signed)
Scheduled for 04/30/2021

## 2021-03-04 ENCOUNTER — Ambulatory Visit: Payer: Self-pay | Admitting: Licensed Clinical Social Worker

## 2021-03-04 ENCOUNTER — Institutional Professional Consult (permissible substitution): Payer: Self-pay | Admitting: Licensed Clinical Social Worker

## 2021-03-04 ENCOUNTER — Other Ambulatory Visit: Payer: Self-pay

## 2021-03-04 DIAGNOSIS — Z7689 Persons encountering health services in other specified circumstances: Secondary | ICD-10-CM
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
## 2021-03-18 ENCOUNTER — Other Ambulatory Visit: Payer: Self-pay

## 2021-03-18 ENCOUNTER — Ambulatory Visit: Payer: Medicaid Other | Admitting: Licensed Clinical Social Worker

## 2021-03-18 DIAGNOSIS — Z7689 Persons encountering health services in other specified circumstances: Secondary | ICD-10-CM

## 2021-03-18 NOTE — BH Specialist Note (Unsigned)
[facility] Follow Up In-Person Visit  MRN: [id number] Name: Alvin Gonzalez.  Total time: {IBH Total Time:21014050} minutes  Types of Service: {CHL AMB TYPE OF [phone]}  Interpretor:{yes FH:219758} Interpretor Name and Language: ***  Subjective: Alvin Gonzalez. is a 61 y.o. male accompanied by {Patient accompanied by:[phone]} Patient was referred by *** for ***. Patient reports the following symptoms/concerns: *** Duration of problem: ***; Severity of problem: {Mild/Moderate/Severe:20260}  Objective: Mood: {BHH MOOD:22306} and Affect: {BHH AFFECT:22307} Risk of harm to self or others: {CHL AMB BH Suicide Current Mental Status:21022748}  Life Context: Family and Social: *** School/Work: *** Self-Care: *** Life Changes: ***  Patient and/or Family's Strengths/Protective Factors: {CHL AMB BH PROTECTIVE FACTORS:[phone]}  Goals Addressed: Patient will:  Reduce symptoms of: {IBH Symptoms:21014056}   Increase knowledge and/or ability of: {IBH Patient Tools:21014057}   Demonstrate ability to: {IBH Goals:21014053}  Progress towards Goals: {CHL AMB BH PROGRESS TOWARDS [phone]}  Interventions: Interventions utilized:  {IBH Interventions:21014054} Standardized Assessments completed: {IBH Screening Tools:21014051}  Patient and/or Family Response: ***  Patient Centered Plan: Patient is on the following Treatment Plan(s): *** Assessment: Patient currently experiencing ***.   Patient may benefit from ***.  Plan: Follow up with behavioral health clinician on : *** Behavioral recommendations: *** Referral(s): {IBH Referrals:21014055} "From scale of 1-10, how likely are you to follow plan?": ***  Lesli Albee, LCSWA

## 2021-03-20 ENCOUNTER — Other Ambulatory Visit: Payer: Self-pay

## 2021-03-20 ENCOUNTER — Ambulatory Visit: Payer: Self-pay | Admitting: Gerontology

## 2021-03-20 VITALS — BP 125/81 | HR 92 | Temp 97.8°F | Resp 17 | Ht 65.0 in | Wt 167.1 lb

## 2021-03-20 DIAGNOSIS — I10 Essential (primary) hypertension: Secondary | ICD-10-CM

## 2021-03-20 DIAGNOSIS — K219 Gastro-esophageal reflux disease without esophagitis: Secondary | ICD-10-CM

## 2021-03-20 DIAGNOSIS — J3489 Other specified disorders of nose and nasal sinuses: Secondary | ICD-10-CM

## 2021-03-20 DIAGNOSIS — M791 Myalgia, unspecified site: Secondary | ICD-10-CM

## 2021-03-20 MED ORDER — CYCLOBENZAPRINE HCL 5 MG PO TABS
5.0000 mg | ORAL_TABLET | Freq: Three times a day (TID) | ORAL | 0 refills | Status: DC | PRN
  Filled 2021-03-20: qty 90, 30d supply, fill #0

## 2021-03-20 MED ORDER — SALINE SPRAY 0.65 % NA SOLN
1.0000 | NASAL | 0 refills | Status: DC | PRN
Start: 2021-03-20 — End: 2021-05-22
  Filled 2021-03-20: qty 30, fill #0

## 2021-03-20 MED ORDER — PANTOPRAZOLE SODIUM 40 MG PO TBEC
40.0000 mg | DELAYED_RELEASE_TABLET | Freq: Every day | ORAL | 2 refills | Status: DC
  Filled 2021-03-20: qty 30, 30d supply, fill #0
  Filled 2021-04-22: qty 30, 30d supply, fill #1

## 2021-03-20 MED ORDER — AMLODIPINE BESYLATE 10 MG PO TABS
10.0000 mg | ORAL_TABLET | Freq: Every day | ORAL | 2 refills | Status: DC
  Filled 2021-03-20: qty 30, 30d supply, fill #0
  Filled 2021-04-22: qty 30, 30d supply, fill #1
  Filled 2021-05-21: qty 30, 30d supply, fill #2

## 2021-03-20 NOTE — Patient Instructions (Signed)

## 2021-03-20 NOTE — Progress Notes (Signed)
Established Patient Office Visit  Subjective:  Patient ID: Alvin Swager., male    DOB: January 18, 1961  Age: 61 y.o. MRN: [id number]  CC:  Chief Complaint  Patient presents with   Follow-up   Hypertension   Ear Problem    Patient c/o left ear fullness x ~2 weeks. He states it feels like there is water in his ear.    HPI Alvin Gonzalez.  Is a 61 y/o male who has history of Anxiety, Depression,Hypertension, Chronic back pain presents for c/o left ear fullness that has being going on for 2 weeks. He denies otalgia, tinnitus, mastoid pain, dizziness, tooth ache, but endorses sinus pressure. He states that he's complaint with his medications, denies side effects and continues to make healthy lifestyle changes. He also c/o constant dull pain to the right side of his body after falling 40 feet from a tree years ago. He states that he's allergic to NSAID and was prescribed Cyclobenzaprine which minimally relieved symptoms. Overall, he states that he's doing well and offers no further complaint.  Past Medical History:  Diagnosis Date   Anxiety    Chronic back pain    Depression    Depression    Hypertension    Intermittent explosive disorder    Suicide attempt ([facility])    prior to 02/18/14 admission    Tobacco abuse     Past Surgical History:  Procedure Laterality Date   "broken back"     CHEST TUBE INSERTION     LYMPH NODE BIOPSY      Family History  Problem Relation Age of Onset   Diabetes Mother    Hypertension Mother    Heart disease Father    Hypertension Father    Cirrhosis Father    Cancer Sister    Heart attack Maternal Grandmother    Suicidality Maternal Grandfather    Other Paternal Grandmother        unknown medical history   Suicidality Paternal Grandfather     Social History   Socioeconomic History   Marital status: Legally Separated    Spouse name: Not on file   Number of children: Not on file   Years of education: Not on file   Highest education  level: Not on file  Occupational History   Not on file  Tobacco Use   Smoking status: Every Day    Packs/day: 1.00    Years: 45.00    Pack years: 45.00    Types: Cigarettes   Smokeless tobacco: Former    Quit date: 01/30/2015  Vaping Use   Vaping Use: Former   Quit date: 01/23/2017  Substance and Sexual Activity   Alcohol use: Not Currently    Comment: 1-2 beers per week, last use 2018   Drug use: Not Currently    Types: Marijuana    Comment: last use 2018   Sexual activity: Yes  Other Topics Concern   Not on file  Social History Narrative   Not on file   Social Determinants of Health   Financial Resource Strain: Not on file  Food Insecurity: No Food Insecurity   Worried About Running Out of Food in the Last Year: Never true   [place] in the Last Year: Never true  Transportation Needs: No Transportation Needs   Lack of Transportation (Medical): No   Lack of Transportation (Non-Medical): No  Physical Activity: Not on file  Stress: Not on file  Social Connections: Not on file  Intimate Partner Violence: Not on file    Outpatient Medications Prior to Visit  Medication Sig Dispense Refill   acetaminophen (TYLENOL) 500 MG tablet Take 500 mg by mouth as needed. 6 per day     hydrochlorothiazide (HYDRODIURIL) 25 MG tablet TAKE 1 TABLET BY MOUTH ONCE DAILY. 30 tablet 0   lisinopril (ZESTRIL) 30 MG tablet TAKE 1 TABLET BY MOUTH ONCE DAILY. 30 tablet 2   amLODipine (NORVASC) 10 MG tablet Take 1 tablet (10 mg total) by mouth once daily. 30 tablet 0   cyclobenzaprine (FLEXERIL) 5 MG tablet Take 1 tablet (5 mg total) by mouth 3 (three) times daily as needed for muscle spasms. 90 tablet 0   pantoprazole (PROTONIX) 40 MG tablet Take 1 tablet (40 mg total) by mouth once daily. 30 tablet 0   amitriptyline (ELAVIL) 50 MG tablet Take 100 mg by mouth at bedtime. (Patient not taking: Reported on 01/23/2021)     aspirin 81 MG chewable tablet Chew 81 mg by mouth daily. (Patient not  taking: Reported on 01/23/2021)     atorvastatin (LIPITOR) 10 MG tablet Take 10 mg by mouth daily. (Patient not taking: Reported on 02/20/2021)     No facility-administered medications prior to visit.    Allergies  Allergen Reactions   Carbamazepine Rash and Hives   Ibuprofen Nausea And Vomiting and Rash    GI Bleeding    Risperidone Swelling    Tongue swelling Tongue swelling Tongue swelling Tongue swelling? Pt self report   Naproxen Other (See Comments)    stomach bleeding stomach bleeding stomach bleeding    ROS Review of Systems  Constitutional: Negative.   HENT:  Positive for sinus pressure.        Ear fullness  Respiratory: Negative.    Cardiovascular: Negative.   Neurological: Negative.   Psychiatric/Behavioral: Negative.       Objective:    Physical Exam HENT:     Head: Normocephalic and atraumatic.     Right Ear: Hearing, tympanic membrane, ear canal and external ear normal. No tenderness. No middle ear effusion. There is no impacted cerumen. No mastoid tenderness.     Left Ear: Hearing, tympanic membrane, ear canal and external ear normal. No tenderness.  No middle ear effusion. There is no impacted cerumen. No mastoid tenderness.     Mouth/Throat:     Mouth: Mucous membranes are moist.  Eyes:     Extraocular Movements: Extraocular movements intact.     Conjunctiva/sclera: Conjunctivae normal.     Pupils: Pupils are equal, round, and reactive to light.  Cardiovascular:     Rate and Rhythm: Normal rate and regular rhythm.     Pulses: Normal pulses.     Heart sounds: Normal heart sounds.  Pulmonary:     Effort: Pulmonary effort is normal.     Breath sounds: Normal breath sounds.  Neurological:     General: No focal deficit present.     Mental Status: He is alert and oriented to person, place, and time. Mental status is at baseline.  Psychiatric:        Mood and Affect: Mood normal.        Behavior: Behavior normal.        Thought Content: Thought  content normal.        Judgment: Judgment normal.    BP 125/81 (BP Location: Left Arm, Patient Position: Sitting, Cuff Size: Large)    Pulse 92    Temp 97.8 F (36.6 C) (Oral)  Resp 17    Ht 5' 5"  (1.651 m)    Wt 167 lb 1.6 oz (75.8 kg)    SpO2 92%    BMI 27.81 kg/m  Wt Readings from Last 3 Encounters:  03/20/21 167 lb 1.6 oz (75.8 kg)  02/20/21 166 lb 4.8 oz (75.4 kg)  01/23/21 168 lb 1.6 oz (76.2 kg)     Health Maintenance Due  Topic Date Due   COVID-19 Vaccine (1) Never done   HIV Screening  Never done   Hepatitis C Screening  Never done   TETANUS/TDAP  Never done   Zoster Vaccines- Shingrix (1 of 2) Never done   COLONOSCOPY (Pts 45-58yr Insurance coverage will need to be confirmed)  Never done    There are no preventive care reminders to display for this patient.  No results found for: TSH Lab Results  Component Value Date   WBC 7.2 11/01/2016   HGB 15.4 11/01/2016   HCT 43.8 11/01/2016   MCV 84.1 11/01/2016   PLT 319 11/01/2016   Lab Results  Component Value Date   NA 144 01/23/2021   K 3.9 01/23/2021   CO2 25 01/23/2021   GLUCOSE 132 (H) 01/23/2021   BUN 13 01/23/2021   CREATININE 0.91 01/23/2021   BILITOT 0.2 01/23/2021   ALKPHOS 126 (H) 01/23/2021   AST 20 01/23/2021   ALT 22 01/23/2021   PROT 7.1 01/23/2021   ALBUMIN 4.6 01/23/2021   CALCIUM 9.6 01/23/2021   ANIONGAP 9 11/01/2016   EGFR 98 01/23/2021   Lab Results  Component Value Date   CHOL 135 01/23/2021   Lab Results  Component Value Date   HDL 28 (L) 01/23/2021   Lab Results  Component Value Date   LDLCALC 72 01/23/2021   Lab Results  Component Value Date   TRIG 212 (H) 01/23/2021   Lab Results  Component Value Date   CHOLHDL 4.8 01/23/2021   Lab Results  Component Value Date   HGBA1C 5.9 (H) 01/23/2021      Assessment & Plan:   1. Gastroesophageal reflux disease without esophagitis - His acid reflux is under control, he will continue current medication, advised to  complete [facility] financial application for Gastroenterology referral. - pantoprazole (PROTONIX) 40 MG tablet; Take 1 tablet (40 mg total) by mouth once daily.  Dispense: 30 tablet; Refill: 2  2. Essential hypertension - His blood pressure is under control, will continue on current medication, DASH diet. - amLODipine (NORVASC) 10 MG tablet; Take 1 tablet (10 mg total) by mouth once daily.  Dispense: 30 tablet; Refill: 2  3. Muscle pain - He will continue on Cyclobenzaprine, and was referred to Pain Clinic for evaluation. - cyclobenzaprine (FLEXERIL) 5 MG tablet; Take 1 tablet (5 mg total) by mouth 3 (three) times daily as needed for muscle spasms.  Dispense: 90 tablet; Refill: 0 - Ambulatory referral to Pain Clinic  4. Sinus pressure - His TM was clear, no effusion, nor mastoid pain. He was started on Saline nasal spray, was advised to notify clinic for worsening symptoms. - sodium chloride (OCEAN) 0.65 % SOLN nasal spray; Place 1 spray into both nostrils as needed for congestion.  Dispense: 30 mL; Refill: 0     Follow-up: Return in about 2 months (around 05/21/2021), or if symptoms worsen or fail to improve.    Shaquila Sigman EJerold Coombe NP

## 2021-03-21 ENCOUNTER — Other Ambulatory Visit: Payer: Self-pay | Admitting: Gerontology

## 2021-03-21 ENCOUNTER — Other Ambulatory Visit: Payer: Self-pay

## 2021-03-21 DIAGNOSIS — I10 Essential (primary) hypertension: Secondary | ICD-10-CM

## 2021-03-25 ENCOUNTER — Other Ambulatory Visit: Payer: Self-pay

## 2021-03-25 MED FILL — Hydrochlorothiazide Tab 25 MG: ORAL | 30 days supply | Qty: 30 | Fill #0 | Status: AC

## 2021-04-17 ENCOUNTER — Other Ambulatory Visit: Payer: Self-pay

## 2021-04-22 ENCOUNTER — Other Ambulatory Visit: Payer: Self-pay | Admitting: Gerontology

## 2021-04-22 ENCOUNTER — Other Ambulatory Visit: Payer: Self-pay

## 2021-04-22 DIAGNOSIS — I10 Essential (primary) hypertension: Secondary | ICD-10-CM

## 2021-04-22 DIAGNOSIS — M791 Myalgia, unspecified site: Secondary | ICD-10-CM

## 2021-04-22 MED FILL — Hydrochlorothiazide Tab 25 MG: ORAL | 30 days supply | Qty: 30 | Fill #0 | Status: AC

## 2021-04-22 MED FILL — Cyclobenzaprine HCl Tab 5 MG: ORAL | 30 days supply | Qty: 90 | Fill #0 | Status: AC

## 2021-04-30 ENCOUNTER — Ambulatory Visit (INDEPENDENT_AMBULATORY_CARE_PROVIDER_SITE_OTHER): Payer: Self-pay | Admitting: Gastroenterology

## 2021-04-30 ENCOUNTER — Other Ambulatory Visit: Payer: Self-pay

## 2021-04-30 ENCOUNTER — Encounter: Payer: Self-pay | Admitting: Gastroenterology

## 2021-04-30 VITALS — BP 134/95 | HR 92 | Temp 98.3°F | Ht 65.0 in | Wt 158.0 lb

## 2021-04-30 DIAGNOSIS — K625 Hemorrhage of anus and rectum: Secondary | ICD-10-CM

## 2021-04-30 MED ORDER — PEG 3350-KCL-NABCB-NACL-NASULF 236 G PO SOLR
Freq: Once | ORAL | 0 refills | Status: AC
  Filled 2021-04-30: qty 4000, 1d supply, fill #0

## 2021-04-30 NOTE — Progress Notes (Signed)
?  ?Jonathon Bellows MD, MRCP(U.K) ?[address]  ?[address]  ?[place], [place] [address]  ?Main: [phone]  ?Fax: [phone] ? ? ?Gastroenterology Consultation ? ?Referring Provider:     Langston Reusing, NP ?Primary Care Physician:  System, Provider Not In ?Primary Gastroenterologist:  Dr. Jonathon Bellows  ?Reason for Consultation:     Hemorrhoids ?      ? HPI:   ?Alvin Gonzalez. is a 61 y.o. y/o male referred for hemorrhoids. ? ?He states that he was released recently from jail.  He has had issues with rectal bleeding with blood on the tissue paper ongoing on and off for more than a few months.  His last colonoscopy was in 2015 which he recollects was normal.  Denies any change in his bowel habits as such denies any constipation.  He has used medicated wipes which has not helped.  Denies any perianal itching.  He believes something is protruding out of his anus. ? ? ?PROCEDURE NOTE: ?The patient presents with symptomatic grade 2 hemorrhoids, unresponsive to maximal medical therapy, requesting rubber band ligation of his/her hemorrhoidal disease.  All risks, benefits and alternative forms of therapy were described and informed consent was obtained. ? ?In the Left Lateral Decubitus position (if anoscopy is performed) anoscopic examination revealed grade 1 hemorrhoids in the all position(s).  ? ?The decision was made to band the LL internal hemorrhoid, and the [place] [address] [place] [employer] System was used to perform band ligation without complication.  Digital anorectal examination was then performed to assure proper positioning of the band, and to adjust the banded tissue as required.  The patient was discharged home without pain or other issues.  Dietary and behavioral recommendations were given and (if necessary - prescriptions were given), along with follow-up instructions.  The patient will return 4 weeks for follow-up and possible additional banding as required. ? ?No complications were encountered and the patient  tolerated the procedure well. ? ? ? ?Past Medical History:  ?Diagnosis Date  ? Anxiety   ? Chronic back pain   ? Depression   ? Depression   ? Hypertension   ? Intermittent explosive disorder   ? Suicide attempt [facility])   ? prior to 02/18/14 admission   ? Tobacco abuse   ? ? ?Past Surgical History:  ?Procedure Laterality Date  ? "broken back"    ? CHEST TUBE INSERTION    ? LYMPH NODE BIOPSY    ? ? ?Prior to Admission medications   ?Medication Sig Start Date End Date Taking? Authorizing Provider  ?amitriptyline (ELAVIL) 50 MG tablet Take 100 mg by mouth at bedtime. ?Patient not taking: Reported on 01/23/2021    [provider]  ?amLODipine (NORVASC) 10 MG tablet Take 1 tablet (10 mg total) by mouth once daily. 03/20/21   Iloabachie, Chioma E, NP  ?aspirin 81 MG chewable tablet Chew 81 mg by mouth daily. ?Patient not taking: Reported on 01/23/2021    [provider]  ?cyclobenzaprine (FLEXERIL) 5 MG tablet Take 1 tablet (5 mg total) by mouth 3 (three) times daily as needed for muscle spasms. 04/22/21   Iloabachie, Chioma E, NP  ?Docusate Sodium (DSS) 100 MG CAPS Take 1 capsule by mouth daily. 08/12/15   [provider]  ?fenofibrate (TRICOR) 145 MG tablet Take 1 tablet by mouth in the morning.    [provider]  ?gabapentin (NEURONTIN) 300 MG capsule Take 1 capsule by mouth daily. 10/28/16   [provider]  ?hydrochlorothiazide (HYDRODIURIL) 25  MG tablet TAKE 1 TABLET BY MOUTH ONCE DAILY. 04/22/21   Iloabachie, Chioma E, NP  ?HYDROcodone-acetaminophen (NORCO) 10-325 MG tablet Take 1 tablet by mouth every 8 (eight) hours as needed. 10/17/14   [provider]  ?lisinopril (ZESTRIL) 30 MG tablet TAKE 1 TABLET BY MOUTH ONCE DAILY. 02/20/21   Iloabachie, Chioma E, NP  ?OLANZapine (ZYPREXA) 5 MG tablet Take 1 tablet by mouth in the morning and at bedtime.    [provider]  ?omeprazole (PRILOSEC) 20 MG capsule Take 1 capsule by mouth in the morning.    [provider]  ?sertraline (ZOLOFT) 100 MG tablet Take 1 tablet by mouth daily. 11/02/16   [provider]  ?sodium chloride (OCEAN) 0.65 % SOLN nasal spray Place 1 spray into both nostrils daily as needed for congestion. 03/20/21   Iloabachie, Chioma E, NP  ?Turmeric 400 MG CAPS Take twice daily for joint pain 03/26/17   [provider]  ? ? ?Family History  ?Problem Relation Age of Onset  ? Diabetes Mother   ? Hypertension Mother   ? Heart disease Father   ? Hypertension Father   ? Cirrhosis Father   ? Cancer Sister   ? Heart attack Maternal Grandmother   ? Suicidality Maternal Grandfather   ? Other Paternal Grandmother   ?     unknown medical history  ? Suicidality Paternal Grandfather   ?  ? ?Social History  ? ?Tobacco Use  ? Smoking status: Every Day  ?  Packs/day: 1.00  ?  Years: 45.00  ?  Pack years: 45.00  ?  Types: Cigarettes  ? Smokeless tobacco: Former  ?  Quit date: 01/30/2015  ?Vaping Use  ? Vaping Use: Former  ? Quit date: 01/23/2017  ?Substance Use Topics  ? Alcohol use: Not Currently  ?  Comment: 1-2 beers per week, last use 2018  ? Drug use: Not Currently  ?  Types: Marijuana  ?  Comment: last use 2018  ? ? ?Allergies as of 04/30/2021 - Review Complete 03/20/2021  ?Allergen Reaction Noted  ? Carbamazepine Rash and Hives 10/27/2013  ? Ibuprofen Nausea And Vomiting and Rash 01/18/2013  ? Risperidone Swelling 06/19/2016  ? Naproxen Other (See Comments) 08/24/2016  ? ? ?Review of Systems:    ?All systems reviewed and negative except where noted in HPI. ? ? Physical Exam:  ?There were no vitals taken for this visit. ?No LMP for male patient. ?Psych:  Alert and cooperative. Normal mood and affect. ?General:   Alert,  Well-developed, well-nourished, pleasant and cooperative in NAD ?Head:  Normocephalic and atraumatic. ?Eyes:  Sclera clear, no icterus.   Conjunctiva pink. ?Lungs:  Respirations even and unlabored.  Clear throughout to auscultation.   No wheezes, crackles, or rhonchi. No acute  distress. ?Heart:  Regular rate and rhythm; no murmurs, clicks, rubs, or gallops. ?Abdomen:  Normal bowel sounds.  No bruits.  Soft, non-tender and non-distended without masses, hepatosplenomegaly or hernias noted.  No guarding or rebound tenderness.    ?Neurologic:  Alert and oriented x3;  grossly normal neurologically. ?Psych:  Alert and cooperative. Normal mood and affect. ? ?Imaging Studies: ?No results found. ? ?Assessment and Plan:  ? ?Alvin Gonzalez. is a 61 y.o. y/o male has been referred for hemorrhoids.  He has history suggestive of hemorrhoidal prolapse as well as rectal bleeding which could be from the hemorrhoids but could also be from other conditions such as polyps or neoplasm.  On anoscopy he  had large hemorrhoids in all 3 positions.  Left lateral column was banded ? ?Plan ?1.  Colonoscopy to evaluate for the cause of rectal bleeding ?2.  Return to the office in 4 weeks time for second round of banding of internal hemorrhoids ? ?I have discussed alternative options, risks & benefits,  which include, but are not limited to, bleeding, infection, perforation,respiratory complication & drug reaction.  The patient agrees with this plan & written consent will be obtained.   ? ? ?Follow up in 4 weeks ? ?Dr Jonathon Bellows MD,MRCP(U.K) ? ?

## 2021-05-01 ENCOUNTER — Other Ambulatory Visit: Payer: Self-pay

## 2021-05-14 ENCOUNTER — Encounter
Admission: RE | Disposition: A | Discharge status: Home / Self Care | Payer: Self-pay | Source: Home / Self Care | Attending: Gastroenterology

## 2021-05-14 ENCOUNTER — Encounter: Payer: Self-pay | Admitting: Gastroenterology

## 2021-05-14 ENCOUNTER — Ambulatory Visit: Payer: Self-pay | Admitting: Anesthesiology

## 2021-05-14 ENCOUNTER — Ambulatory Visit
Admission: RE | Admit: 2021-05-14 | Discharge: 2021-05-14 | Disposition: A | Discharge status: Home / Self Care | Payer: Self-pay | Attending: Gastroenterology | Admitting: Gastroenterology

## 2021-05-14 DIAGNOSIS — F32A Depression, unspecified: Secondary | ICD-10-CM | POA: Insufficient documentation

## 2021-05-14 DIAGNOSIS — K219 Gastro-esophageal reflux disease without esophagitis: Secondary | ICD-10-CM | POA: Insufficient documentation

## 2021-05-14 DIAGNOSIS — D123 Benign neoplasm of transverse colon: Secondary | ICD-10-CM | POA: Insufficient documentation

## 2021-05-14 DIAGNOSIS — K625 Hemorrhage of anus and rectum: Secondary | ICD-10-CM | POA: Insufficient documentation

## 2021-05-14 DIAGNOSIS — D120 Benign neoplasm of cecum: Secondary | ICD-10-CM | POA: Insufficient documentation

## 2021-05-14 DIAGNOSIS — D122 Benign neoplasm of ascending colon: Secondary | ICD-10-CM | POA: Insufficient documentation

## 2021-05-14 DIAGNOSIS — D128 Benign neoplasm of rectum: Secondary | ICD-10-CM | POA: Insufficient documentation

## 2021-05-14 DIAGNOSIS — K641 Second degree hemorrhoids: Secondary | ICD-10-CM | POA: Insufficient documentation

## 2021-05-14 DIAGNOSIS — K635 Polyp of colon: Secondary | ICD-10-CM

## 2021-05-14 DIAGNOSIS — I10 Essential (primary) hypertension: Secondary | ICD-10-CM | POA: Insufficient documentation

## 2021-05-14 DIAGNOSIS — F419 Anxiety disorder, unspecified: Secondary | ICD-10-CM | POA: Insufficient documentation

## 2021-05-14 DIAGNOSIS — F17210 Nicotine dependence, cigarettes, uncomplicated: Secondary | ICD-10-CM | POA: Insufficient documentation

## 2021-05-14 HISTORY — PX: COLONOSCOPY WITH PROPOFOL: SHX5780

## 2021-05-14 SURGERY — COLONOSCOPY WITH PROPOFOL
Anesthesia: General

## 2021-05-14 MED ORDER — PROPOFOL 500 MG/50ML IV EMUL
INTRAVENOUS | Status: DC | PRN
Start: 2021-05-14 — End: 2021-05-14
  Administered 2021-05-14: 165 ug/kg/min via INTRAVENOUS

## 2021-05-14 MED ORDER — LIDOCAINE HCL (CARDIAC) PF 100 MG/5ML IV SOSY
PREFILLED_SYRINGE | INTRAVENOUS | Status: DC | PRN
  Administered 2021-05-14: 100 mg via INTRAVENOUS

## 2021-05-14 MED ORDER — PROPOFOL 10 MG/ML IV BOLUS
INTRAVENOUS | Status: DC | PRN
  Administered 2021-05-14: 70 mg via INTRAVENOUS
  Administered 2021-05-14: 20 mg via INTRAVENOUS

## 2021-05-14 MED ORDER — SODIUM CHLORIDE 0.9 % IV SOLN
INTRAVENOUS | Status: DC
  Administered 2021-05-14: 20 mL/h via INTRAVENOUS

## 2021-05-14 NOTE — Anesthesia Postprocedure Evaluation (Signed)
Anesthesia Post Note ? ?Patient: Alvin Gonzalez. ? ?Procedure(s) Performed: COLONOSCOPY WITH PROPOFOL ? ?Patient location during evaluation: Endoscopy ?Anesthesia Type: General ?Level of consciousness: awake and alert ?Pain management: pain level controlled ?Vital Signs Assessment: post-procedure vital signs reviewed and stable ?Respiratory status: spontaneous breathing, nonlabored ventilation, respiratory function stable and patient connected to nasal cannula oxygen ?Cardiovascular status: blood pressure returned to baseline and stable ?Postop Assessment: no apparent nausea or vomiting ?Anesthetic complications: no ? ? ?No notable events documented. ? ? ?Last Vitals:  ?Vitals:  ? 05/14/21 1110 05/14/21 1120  ?BP: 106/73 121/73  ?Pulse: 73 78  ?Resp: 16 (!) 21  ?Temp:    ?SpO2: 100% 100%  ?  ?Last Pain:  ?Vitals:  ? 05/14/21 1050  ?TempSrc: Temporal  ?PainSc:   ? ? ?  ?  ?  ?  ?  ?  ? ?Arita Miss ? ? ? ? ?

## 2021-05-14 NOTE — Anesthesia Procedure Notes (Signed)
Procedure Name: General with mask airway ?Date/Time: 05/14/2021 10:39 AM ?Performed by: Kelton Pillar, CRNA ?Pre-anesthesia Checklist: Patient identified, Emergency Drugs available, Suction available and Patient being monitored ?Patient Re-evaluated:Patient Re-evaluated prior to induction ?Oxygen Delivery Method: Simple face mask ?Induction Type: IV induction ?Placement Confirmation: positive ETCO2, CO2 detector and breath sounds checked- equal and bilateral ?Dental Injury: Teeth and Oropharynx as per pre-operative assessment  ? ? ? ? ?

## 2021-05-14 NOTE — H&P (Signed)
? ? ? ?Alvin Bellows, MD ?[address] [place], [place], [place], [address] ?[address] [place], [place], [place], [address] ?Phone: [phone]  ?Fax: [phone] ? ?Primary Care Physician:  Langston Reusing, NP ? ? ?Pre-Procedure History & Physical: ?HPI:  Alvin Gonzalez. is a 61 y.o. male is here for an colonoscopy. ?  ?Past Medical History:  ?Diagnosis Date  ? Anxiety   ? Chronic back pain   ? Depression   ? Depression   ? Hypertension   ? Intermittent explosive disorder   ? Suicide attempt [facility])   ? prior to 02/18/14 admission   ? Tobacco abuse   ? ? ?Past Surgical History:  ?Procedure Laterality Date  ? "broken back"    ? CHEST TUBE INSERTION    ? LYMPH NODE BIOPSY    ? ? ?Prior to Admission medications   ?Medication Sig Start Date End Date Taking? Authorizing Provider  ?amLODipine (NORVASC) 10 MG tablet Take 1 tablet (10 mg total) by mouth once daily. 03/20/21  Yes Iloabachie, Chioma E, NP  ?aspirin 81 MG chewable tablet Chew 81 mg by mouth daily.   Yes [provider]  ?cyclobenzaprine (FLEXERIL) 5 MG tablet Take 1 tablet (5 mg total) by mouth 3 (three) times daily as needed for muscle spasms. 04/22/21  Yes Iloabachie, Chioma E, NP  ?Docusate Sodium (DSS) 100 MG CAPS Take 1 capsule by mouth daily. 08/12/15  Yes [provider]  ?fenofibrate (TRICOR) 145 MG tablet Take 1 tablet by mouth in the morning.   Yes [provider]  ?gabapentin (NEURONTIN) 300 MG capsule Take 1 capsule by mouth daily. 10/28/16  Yes [provider]  ?hydrochlorothiazide (HYDRODIURIL) 25 MG tablet TAKE 1 TABLET BY MOUTH ONCE DAILY. 04/22/21  Yes Iloabachie, Chioma E, NP  ?HYDROcodone-acetaminophen (NORCO) 10-325 MG tablet Take 1 tablet by mouth every 8 (eight) hours as needed. 10/17/14  Yes [provider]  ?lisinopril (ZESTRIL) 30 MG tablet TAKE 1 TABLET BY MOUTH ONCE DAILY. 02/20/21  Yes Iloabachie, Chioma E, NP  ?OLANZapine (ZYPREXA) 5 MG tablet Take 1 tablet by mouth in the morning  and at bedtime.   Yes [provider]  ?omeprazole (PRILOSEC) 20 MG capsule Take 1 capsule by mouth in the morning.   Yes [provider]  ?sertraline (ZOLOFT) 100 MG tablet Take 1 tablet by mouth daily. 11/02/16  Yes [provider]  ?sodium chloride (OCEAN) 0.65 % SOLN nasal spray Place 1 spray into both nostrils daily as needed for congestion. 03/20/21  Yes Iloabachie, Chioma E, NP  ?Turmeric 400 MG CAPS Take twice daily for joint pain 03/26/17  Yes [provider]  ?amitriptyline (ELAVIL) 50 MG tablet Take 100 mg by mouth at bedtime. ?Patient not taking: Reported on 04/30/2021    [provider]  ? ? ?Allergies as of 05/01/2021 - Review Complete 04/30/2021  ?Allergen Reaction Noted  ? Carbamazepine Rash and Hives 10/27/2013  ? Ibuprofen Nausea And Vomiting and Rash 01/18/2013  ? Risperidone Swelling 06/19/2016  ? Naproxen Other (See Comments) 08/24/2016  ? ? ?Family History  ?Problem Relation Age of Onset  ? Diabetes Mother   ? Hypertension Mother   ? Heart disease Father   ? Hypertension Father   ? Cirrhosis Father   ? Cancer Sister   ? Heart attack Maternal Grandmother   ? Suicidality Maternal Grandfather   ? Other Paternal Grandmother   ?     unknown medical history  ? Suicidality Paternal  Grandfather   ? ? ?Social History  ? ?Socioeconomic History  ? Marital status: Legally Separated  ?  Spouse name: Not on file  ? Number of children: Not on file  ? Years of education: Not on file  ? Highest education level: Not on file  ?Occupational History  ? Not on file  ?Tobacco Use  ? Smoking status: Every Day  ?  Packs/day: 1.00  ?  Years: 45.00  ?  Pack years: 45.00  ?  Types: Cigarettes  ? Smokeless tobacco: Former  ?  Quit date: 01/30/2015  ?Vaping Use  ? Vaping Use: Former  ? Quit date: 01/23/2017  ?Substance and Sexual Activity  ? Alcohol use: Not Currently  ?  Comment: 1-2 beers per week, last use 2018  ? Drug use: Not Currently  ?  Types: Marijuana  ?  Comment: last use  2018  ? Sexual activity: Yes  ?Other Topics Concern  ? Not on file  ?Social History Narrative  ? Not on file  ? ?Social Determinants of Health  ? ?Financial Resource Strain: Not on file  ?Food Insecurity: No Food Insecurity  ? Worried About Charity fundraiser in the Last Year: Never true  ? Ran Out of Food in the Last Year: Never true  ?Transportation Needs: No Transportation Needs  ? Lack of Transportation (Medical): No  ? Lack of Transportation (Non-Medical): No  ?Physical Activity: Not on file  ?Stress: Not on file  ?Social Connections: Not on file  ?Intimate Partner Violence: Not on file  ? ? ?Review of Systems: ?See HPI, otherwise negative ROS ? ?Physical Exam: ?BP (!) 132/92   Pulse 77   Temp (!) 96.4 ?F (35.8 ?C) (Temporal)   Resp 20   Ht '5\' 5"'$  (1.651 m)   Wt 70.3 kg   SpO2 100%   BMI 25.79 kg/m?  ?General:   Alert,  pleasant and cooperative in NAD ?Head:  Normocephalic and atraumatic. ?Neck:  Supple; no masses or thyromegaly. ?Lungs:  Clear throughout to auscultation, normal respiratory effort.    ?Heart:  +S1, +S2, Regular rate and rhythm, No edema. ?Abdomen:  Soft, nontender and nondistended. Normal bowel sounds, without guarding, and without rebound.   ?Neurologic:  Alert and  oriented x4;  grossly normal neurologically. ? ?Impression/Plan: ?Alvin Gonzalez. is here for an colonoscopy to be performed for rectal bleeding . Risks, benefits, limitations, and alternatives regarding  colonoscopy have been reviewed with the patient.  Questions have been answered.  All parties agreeable. ? ? ?Alvin Bellows, MD  05/14/2021, 10:14 AM ? ?

## 2021-05-14 NOTE — Op Note (Signed)
[facility] ?Gastroenterology ?Patient Name: Alvin Gonzalez ?Procedure Date: 05/14/2021 10:12 AM ?MRN: [id number] ?Account #: [id number] ?Date of Birth: February 04, 1961 ?Admit Type: Outpatient ?Age: 61 ?Room: [facility] ENDO ROOM 3 ?Gender: Male ?Note Status: Finalized ?Instrument Name: Colonscope [id number] ?Procedure:             Colonoscopy ?Indications:           Rectal bleeding ?Providers:             Jonathon Bellows MD, MD ?Referring MD:          Forest Gleason Md, MD (Referring MD) ?Medicines:             Monitored Anesthesia Care ?Complications:         No immediate complications. ?Procedure:             Pre-Anesthesia Assessment: ?                       - Prior to the procedure, a History and Physical was  ?                       performed, and patient medications, allergies and  ?                       sensitivities were reviewed. The patient's tolerance  ?                       of previous anesthesia was reviewed. ?                       - The risks and benefits of the procedure and the  ?                       sedation options and risks were discussed with the  ?                       patient. All questions were answered and informed  ?                       consent was obtained. ?                       - ASA Grade Assessment: II - A patient with mild  ?                       systemic disease. ?                       After obtaining informed consent, the colonoscope was  ?                       passed under direct vision. Throughout the procedure,  ?                       the patient's blood pressure, pulse, and oxygen  ?                       saturations were monitored continuously. The  ?                       Colonoscope was introduced through the  anus and  ?                       advanced to the the cecum, identified by the  ?                       appendiceal orifice. The colonoscopy was performed  ?                       with ease. The patient tolerated the procedure well.  ?                       The quality  of the bowel preparation was excellent. ?Findings: ?     The perianal and digital rectal examinations were normal. ?     Non-bleeding internal hemorrhoids were found during retroflexion. The  ?     hemorrhoids were large and Grade II (internal hemorrhoids that prolapse  ?     but reduce spontaneously). ?     Two sessile polyps were found in the cecum. The polyps were 2 to 3 mm in  ?     size. These polyps were removed with a cold biopsy forceps. Resection  ?     and retrieval were complete. ?     Four sessile polyps were found in the ascending colon. The polyps were 4  ?     to 6 mm in size. These polyps were removed with a cold snare. Resection  ?     and retrieval were complete. ?     Two sessile polyps were found in the transverse colon. The polyps were 4  ?     to 6 mm in size. These polyps were removed with a cold snare. Resection  ?     and retrieval were complete. ?     Two sessile polyps were found in the rectum. The polyps were 4 to 5 mm  ?     in size. These polyps were removed with a cold snare. Resection and  ?     retrieval were complete. ?     The exam was otherwise without abnormality on direct and retroflexion  ?     views. ?Impression:            - Non-bleeding internal hemorrhoids. ?                       - Two 2 to 3 mm polyps in the cecum, removed with a  ?                       cold biopsy forceps. Resected and retrieved. ?                       - Four 4 to 6 mm polyps in the ascending colon,  ?                       removed with a cold snare. Resected and retrieved. ?                       - Two 4 to 6 mm polyps in the transverse colon,  ?  removed with a cold snare. Resected and retrieved. ?                       - Two 4 to 5 mm polyps in the rectum, removed with a  ?                       cold snare. Resected and retrieved. ?                       - The examination was otherwise normal on direct and  ?                       retroflexion views. ?Recommendation:        -  Discharge patient to home (with escort). ?                       - Resume previous diet. ?                       - Continue present medications. ?                       - Await pathology results. ?                       - Repeat colonoscopy in 1 year for surveillance. ?Procedure Code(s):     --- Professional --- ?                       [id number], Colonoscopy, flexible; with removal of  ?                       tumor(s), polyp(s), or other lesion(s) by snare  ?                       technique ?                       45380, 59, Colonoscopy, flexible; with biopsy, single  ?                       or multiple ?Diagnosis Code(s):     --- Professional --- ?                       K63.5, Polyp of colon ?                       K62.1, Rectal polyp ?                       K64.1, Second degree hemorrhoids ?                       K62.5, Hemorrhage of anus and rectum ?CPT copyright 2019 American Medical Association. All rights reserved. ?The codes documented in this report are preliminary and upon coder review may  ?be revised to meet current compliance requirements. ?Jonathon Bellows, MD ?Jonathon Bellows MD, MD ?05/14/2021 10:57:59 AM ?This report has been signed electronically. ?Number of Addenda: 0 ?Note Initiated On: 05/14/2021 10:12 AM ?Scope Withdrawal Time: 0 hours 16 minutes 22 seconds  ?Total Procedure Duration: 0 hours 21 minutes  1 second  ?Estimated Blood Loss:  Estimated blood loss: none. ?     [facility] ?

## 2021-05-14 NOTE — Anesthesia Preprocedure Evaluation (Signed)
Anesthesia Evaluation  ?Patient identified by MRN, date of birth, ID band ?Patient awake ? ? ? ?Reviewed: ?Allergy & Precautions, NPO status , Patient's Chart, lab work & pertinent test results ? ?History of Anesthesia Complications ?Negative for: history of anesthetic complications ? ?Airway ?Mallampati: II ? ?TM Distance: >3 FB ?Neck ROM: Full ? ? ? Dental ? ?(+) Poor Dentition, Chipped ?  ?Pulmonary ?neg sleep apnea, neg COPD, Current SmokerPatient did not abstain from smoking.,  ?  ?Pulmonary exam normal ?breath sounds clear to auscultation ? ? ? ? ? ? Cardiovascular ?Exercise Tolerance: Good ?METShypertension, Pt. on medications ?(-) CAD and (-) Past MI (-) dysrhythmias  ?Rhythm:Regular Rate:Normal ?- Systolic murmurs ? ?  ?Neuro/Psych ?PSYCHIATRIC DISORDERS Anxiety Depression negative neurological ROS ?   ? GI/Hepatic ?GERD  Medicated and Controlled,(+)  ?  ? (-) substance abuse ? ,   ?Endo/Other  ?neg diabetes ? Renal/GU ?negative Renal ROS  ? ?  ?Musculoskeletal ? ? Abdominal ?  ?Peds ? Hematology ?  ?Anesthesia Other Findings ?Past Medical History: ?No date: Anxiety ?No date: Chronic back pain ?No date: Depression ?No date: Depression ?No date: Hypertension ?No date: Intermittent explosive disorder ?No date: Suicide attempt [facility]) ?    Comment:  prior to 02/18/14 admission  ?No date: Tobacco abuse ? Reproductive/Obstetrics ? ?  ? ? ? ? ? ? ? ? ? ? ? ? ? ?  ?  ? ? ? ? ? ? ? ? ?Anesthesia Physical ?Anesthesia Plan ? ?ASA: 2 ? ?Anesthesia Plan: General  ? ?Post-op Pain Management: Minimal or no pain anticipated  ? ?Induction: Intravenous ? ?PONV Risk Score and Plan: 1 and Propofol infusion, TIVA and Ondansetron ? ?Airway Management Planned: Nasal Cannula ? ?Additional Equipment: None ? ?Intra-op Plan:  ? ?Post-operative Plan:  ? ?Informed Consent: I have reviewed the patients History and Physical, chart, labs and discussed the procedure including the risks, benefits and  alternatives for the proposed anesthesia with the patient or authorized representative who has indicated his/her understanding and acceptance.  ? ? ? ?Dental advisory given ? ?Plan Discussed with: CRNA and Surgeon ? ?Anesthesia Plan Comments: (Discussed risks of anesthesia with patient, including possibility of difficulty with spontaneous ventilation under anesthesia necessitating airway intervention, PONV, and rare risks such as cardiac or respiratory or neurological events, and allergic reactions. Discussed the role of CRNA in patient's perioperative care. Patient understands. ?Patient counseled on benefits of smoking cessation, and increased perioperative risks associated with continued smoking. ?)  ? ? ? ? ? ? ?Anesthesia Quick Evaluation ? ?

## 2021-05-14 NOTE — Transfer of Care (Signed)
Immediate Anesthesia Transfer of Care Note ? ?Patient: Alvin Gonzalez. ? ?Procedure(s) Performed: COLONOSCOPY WITH PROPOFOL ? ?Patient Location: Endoscopy Unit ? ?Anesthesia Type:General ? ?Level of Consciousness: drowsy and patient cooperative ? ?Airway & Oxygen Therapy: Patient Spontanous Breathing and Patient connected to face mask oxygen ? ?Post-op Assessment: Report given to RN and Post -op Vital signs reviewed and stable ? ?Post vital signs: Reviewed and stable ? ?Last Vitals:  ?Vitals Value Taken Time  ?BP    ?Temp    ?Pulse 78 05/14/21 1058  ?Resp 18 05/14/21 1058  ?SpO2 100 % 05/14/21 1058  ?Vitals shown include unvalidated device data. ? ?Last Pain:  ?Vitals:  ? 05/14/21 0911  ?TempSrc: Temporal  ?PainSc: 7   ?   ? ?  ? ?Complications: No notable events documented. ?

## 2021-05-15 ENCOUNTER — Encounter: Payer: Self-pay | Admitting: Gastroenterology

## 2021-05-15 LAB — SURGICAL PATHOLOGY

## 2021-05-20 ENCOUNTER — Encounter: Payer: Self-pay | Admitting: Licensed Clinical Social Worker

## 2021-05-21 ENCOUNTER — Other Ambulatory Visit: Payer: Self-pay

## 2021-05-21 ENCOUNTER — Other Ambulatory Visit: Payer: Self-pay | Admitting: Gerontology

## 2021-05-21 DIAGNOSIS — M791 Myalgia, unspecified site: Secondary | ICD-10-CM

## 2021-05-21 DIAGNOSIS — I10 Essential (primary) hypertension: Secondary | ICD-10-CM

## 2021-05-21 MED FILL — Hydrochlorothiazide Tab 25 MG: ORAL | 30 days supply | Qty: 30 | Fill #1 | Status: AC

## 2021-05-22 ENCOUNTER — Ambulatory Visit: Payer: Medicaid Other | Admitting: Gerontology

## 2021-05-22 VITALS — BP 144/88 | HR 94 | Temp 98.0°F | Resp 18 | Ht 65.0 in | Wt 153.0 lb

## 2021-05-22 DIAGNOSIS — M791 Myalgia, unspecified site: Secondary | ICD-10-CM

## 2021-05-22 DIAGNOSIS — I10 Essential (primary) hypertension: Secondary | ICD-10-CM

## 2021-05-22 DIAGNOSIS — G8929 Other chronic pain: Secondary | ICD-10-CM

## 2021-05-22 DIAGNOSIS — K0889 Other specified disorders of teeth and supporting structures: Secondary | ICD-10-CM | POA: Insufficient documentation

## 2021-05-22 DIAGNOSIS — Z87898 Personal history of other specified conditions: Secondary | ICD-10-CM | POA: Insufficient documentation

## 2021-05-22 MED ORDER — CYCLOBENZAPRINE HCL 10 MG PO TABS
10.0000 mg | ORAL_TABLET | Freq: Two times a day (BID) | ORAL | 2 refills | Status: DC
  Filled 2021-05-22: qty 60, 30d supply, fill #0

## 2021-05-22 MED ORDER — AMLODIPINE BESYLATE 10 MG PO TABS
10.0000 mg | ORAL_TABLET | Freq: Every day | ORAL | 2 refills | Status: DC
  Filled 2021-06-19 – 2021-07-16 (×2): qty 30, 30d supply, fill #0

## 2021-05-22 MED ORDER — HYDROCHLOROTHIAZIDE 25 MG PO TABS
25.0000 mg | ORAL_TABLET | Freq: Every day | ORAL | 2 refills | Status: DC
  Filled 2021-06-19: qty 90, 90d supply, fill #0

## 2021-05-22 MED ORDER — LISINOPRIL 30 MG PO TABS
30.0000 mg | ORAL_TABLET | Freq: Every day | ORAL | 2 refills | Status: DC
  Filled 2021-05-22: qty 30, 30d supply, fill #0
  Filled 2021-06-19: qty 30, 30d supply, fill #1
  Filled 2021-07-16: qty 30, 30d supply, fill #0

## 2021-05-22 NOTE — Patient Instructions (Signed)
Healthy Living: Sleep ?You will learn why sleep is an important part of a healthy lifestyle. ?To view the content, go to this web address: ?[email or web address] ? ?This video will expire on: 04/29/2023. If you need access to this video following this date, please reach out to the healthcare provider who assigned it to you. ?This information is not intended to replace advice given to you by your health care provider. Make sure you discuss any questions you have with your health care provider. ?Elsevier Patient Education ? [place] Eating Plan ?DASH stands for Dietary Approaches to Stop Hypertension. The DASH eating plan is a healthy eating plan that has been shown to: ?Reduce high blood pressure (hypertension). ?Reduce your risk for type 2 diabetes, heart disease, and stroke. ?Help with weight loss. ?What are tips for following this plan? ?Reading food labels ?Check food labels for the amount of salt (sodium) per serving. Choose foods with less than 5 percent of the Daily Value of sodium. Generally, foods with less than 300 milligrams (mg) of sodium per serving fit into this eating plan. ?To find whole grains, look for the word "whole" as the first word in the ingredient list. ?Shopping ?Buy products labeled as "low-sodium" or "no salt added." ?Buy fresh foods. Avoid canned foods and pre-made or frozen meals. ?Cooking ?Avoid adding salt when cooking. Use salt-free seasonings or herbs instead of table salt or sea salt. Check with your health care provider or pharmacist before using salt substitutes. ?Do not fry foods. Cook foods using healthy methods such as baking, boiling, grilling, roasting, and broiling instead. ?Cook with heart-healthy oils, such as olive, canola, avocado, soybean, or sunflower oil. ?Meal planning ? ?Eat a balanced diet that includes: ?4 or more servings of fruits and 4 or more servings of vegetables each day. Try to fill one-half of your plate with fruits and  vegetables. ?6-8 servings of whole grains each day. ?Less than 6 oz (170 g) of lean meat, poultry, or fish each day. A 3-oz (85-g) serving of meat is about the same size as a deck of cards. One egg equals 1 oz (28 g). ?2-3 servings of low-fat dairy each day. One serving is 1 cup (237 mL). ?1 serving of nuts, seeds, or beans 5 times each week. ?2-3 servings of heart-healthy fats. Healthy fats called omega-3 fatty acids are found in foods such as walnuts, flaxseeds, fortified milks, and eggs. These fats are also found in cold-water fish, such as sardines, salmon, and mackerel. ?Limit how much you eat of: ?Canned or prepackaged foods. ?Food that is high in trans fat, such as some fried foods. ?Food that is high in saturated fat, such as fatty meat. ?Desserts and other sweets, sugary drinks, and other foods with added sugar. ?Full-fat dairy products. ?Do not salt foods before eating. ?Do not eat more than 4 egg yolks a week. ?Try to eat at least 2 vegetarian meals a week. ?Eat more home-cooked food and less restaurant, buffet, and fast food. ?Lifestyle ?When eating at a restaurant, ask that your food be prepared with less salt or no salt, if possible. ?If you drink alcohol: ?Limit how much you use to: ?0-1 drink a day for women who are not pregnant. ?0-2 drinks a day for men. ?Be aware of how much alcohol is in your drink. In the [place] one drink equals one 12 oz bottle of beer (355 mL), one 5 oz glass of wine (148 mL), or one 1? oz glass of hard liquor (44  mL). ?General information ?Avoid eating more than 2,300 mg of salt a day. If you have hypertension, you may need to reduce your sodium intake to 1,500 mg a day. ?Work with your health care provider to maintain a healthy body weight or to lose weight. Ask what an ideal weight is for you. ?Get at least 30 minutes of exercise that causes your heart to beat faster (aerobic exercise) most days of the week. Activities may include walking, swimming, or biking. ?Work with  your health care provider or dietitian to adjust your eating plan to your individual calorie needs. ?What foods should I eat? ?Fruits ?All fresh, dried, or frozen fruit. Canned fruit in natural juice (without added sugar). ?Vegetables ?Fresh or frozen vegetables (raw, steamed, roasted, or grilled). Low-sodium or reduced-sodium tomato and vegetable juice. Low-sodium or reduced-sodium tomato sauce and tomato paste. Low-sodium or reduced-sodium canned vegetables. ?Grains ?Whole-grain or whole-wheat bread. Whole-grain or whole-wheat pasta. Brown rice. [place] Morrow. Bulgur. Whole-grain and low-sodium cereals. Pita bread. Low-fat, low-sodium crackers. Whole-wheat flour tortillas. ?Meats and other proteins ?Skinless chicken or [place]. Ground chicken or [place]. Pork with fat trimmed off. Fish and seafood. Egg whites. Dried beans, peas, or lentils. Unsalted nuts, nut butters, and seeds. Unsalted canned beans. Lean cuts of beef with fat trimmed off. Low-sodium, lean precooked or cured meat, such as sausages or meat loaves. ?Dairy ?Low-fat (1%) or fat-free (skim) milk. Reduced-fat, low-fat, or fat-free cheeses. Nonfat, low-sodium ricotta or cottage cheese. Low-fat or nonfat yogurt. Low-fat, low-sodium cheese. ?Fats and oils ?Soft margarine without trans fats. Vegetable oil. Reduced-fat, low-fat, or light mayonnaise and salad dressings (reduced-sodium). Canola, safflower, olive, avocado, soybean, and sunflower oils. Avocado. ?Seasonings and condiments ?Herbs. Spices. Seasoning mixes without salt. ?Other foods ?Unsalted popcorn and pretzels. Fat-free sweets. ?The items listed above may not be a complete list of foods and beverages you can eat. Contact a dietitian for more information. ?What foods should I avoid? ?Fruits ?Canned fruit in a light or heavy syrup. Fried fruit. Fruit in cream or butter sauce. ?Vegetables ?Creamed or fried vegetables. Vegetables in a cheese sauce. Regular canned vegetables (not low-sodium or  reduced-sodium). Regular canned tomato sauce and paste (not low-sodium or reduced-sodium). Regular tomato and vegetable juice (not low-sodium or reduced-sodium). Angie Fava. Olives. ?Grains ?Baked goods made with fat, such as croissants, muffins, or some breads. Dry pasta or rice meal packs. ?Meats and other proteins ?Fatty cuts of meat. Ribs. Fried meat. Berniece Salines. Bologna, salami, and other precooked or cured meats, such as sausages or meat loaves. Fat from the back of a pig (fatback). Bratwurst. Salted nuts and seeds. Canned beans with added salt. Canned or smoked fish. Whole eggs or egg yolks. Chicken or [place] with skin. ?Dairy ?Whole or 2% milk, cream, and half-and-half. Whole or full-fat cream cheese. Whole-fat or sweetened yogurt. Full-fat cheese. Nondairy creamers. Whipped toppings. Processed cheese and cheese spreads. ?Fats and oils ?Butter. Stick margarine. Lard. Shortening. Ghee. Bacon fat. Tropical oils, such as coconut, palm kernel, or palm oil. ?Seasonings and condiments ?Onion salt, garlic salt, seasoned salt, table salt, and sea salt. Worcestershire sauce. Tartar sauce. Barbecue sauce. Teriyaki sauce. Soy sauce, including reduced-sodium. Steak sauce. Canned and packaged gravies. Fish sauce. Oyster sauce. Cocktail sauce. Store-bought horseradish. Ketchup. Mustard. Meat flavorings and tenderizers. Bouillon cubes. Hot sauces. Pre-made or packaged marinades. Pre-made or packaged taco seasonings. Relishes. Regular salad dressings. ?Other foods ?Salted popcorn and pretzels. ?The items listed above may not be a complete list of foods and beverages you should avoid. Contact  a dietitian for more information. ?Where to find more information ?National Heart, Lung, and Blood Institute: https://wilson-eaton.com/ ?American Heart Association: www.heart.org ?Academy of Nutrition and Dietetics: www.eatright.org ?[place]: www.kidney.org ?Summary ?The DASH eating plan is a healthy eating plan that has been shown  to reduce high blood pressure (hypertension). It may also reduce your risk for type 2 diabetes, heart disease, and stroke. ?When on the DASH eating plan, aim to eat more fresh fruits and vegetables, whole grains, lean p
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
## 2021-05-23 ENCOUNTER — Other Ambulatory Visit: Payer: Self-pay

## 2021-05-23 ENCOUNTER — Other Ambulatory Visit: Payer: Self-pay | Admitting: Gerontology

## 2021-05-23 DIAGNOSIS — K219 Gastro-esophageal reflux disease without esophagitis: Secondary | ICD-10-CM

## 2021-05-23 MED ORDER — CYCLOBENZAPRINE HCL 5 MG PO TABS
ORAL_TABLET | Freq: Three times a day (TID) | ORAL | 2 refills | Status: DC
  Filled 2021-05-23: qty 90, 30d supply, fill #0
  Filled 2021-06-19: qty 90, 30d supply, fill #1

## 2021-05-27 ENCOUNTER — Other Ambulatory Visit: Payer: Self-pay

## 2021-05-27 MED FILL — Pantoprazole Sodium EC Tab 40 MG (Base Equiv): ORAL | 90 days supply | Qty: 90 | Fill #0 | Status: AC

## 2021-06-02 ENCOUNTER — Ambulatory Visit: Payer: Self-pay | Admitting: Gastroenterology

## 2021-06-02 ENCOUNTER — Encounter: Payer: Self-pay | Admitting: Gastroenterology

## 2021-06-02 VITALS — BP 148/91 | HR 73 | Temp 98.9°F | Wt 148.8 lb

## 2021-06-02 DIAGNOSIS — K648 Other hemorrhoids: Secondary | ICD-10-CM

## 2021-06-02 DIAGNOSIS — Z86010 Personal history of colonic polyps: Secondary | ICD-10-CM

## 2021-06-02 NOTE — Progress Notes (Signed)
Patient follow-ups today for banding of hemorrhoids ? ? ? ?Summary of history : ?He has had issues with rectal bleeding with blood on the tissue paper ongoing on and off for more than a few months.    He has used medicated wipes which has not helped.  Denies any perianal itching.  .05/14/2021: Colonoscopy 10 polyps resected multiple adenomas, large hemorrhoids  ? ?First round:04/30/2021: LL column banded  ? ? ?Interval history  04/30/2021-06/02/2021 ? ?After his last banding he did well with decreased amount of bleeding.  He is interested to pursue genetic testing as he had over 10 adenomas. ? ?Digital rectal exam performed in the presence of a chaperone. ?External anal findings: Prolapsing second-degree hemorrhoids ?Internal findings: , No masses, no blood on glove noticed. ? ? ? ?PROCEDURE NOTE: ?The patient presents with symptomatic grade 2 hemorrhoids, unresponsive to maximal medical therapy, requesting rubber band ligation of his/her hemorrhoidal disease.  All risks, benefits and alternative forms of therapy were described and informed consent was obtained. ? ?In the Left Lateral Decubitus position (if anoscopy is performed) anoscopic examination revealed grade 2 hemorrhoids in the RA and RP position(s).  ? ?The decision was made to band the RA internal hemorrhoid, and the [place] O?Regan System was used to perform band ligation without complication.  Digital anorectal examination was then performed to assure proper positioning of the band, and to adjust the banded tissue as required.  The patient was discharged home without pain or other issues.  Dietary and behavioral recommendations were given and (if necessary - prescriptions were given), along with follow-up instructions.  The patient will return 4 weeks for follow-up and possible additional banding as required. ? ?No complications were encountered and the patient tolerated the procedure well. ? ? ?Plan: ? ?Avoid constipation.  Commence on stool softeners if not  already on ?Refer for genetic testing for numerous polyps ?Repeat colonoscopy in 05/2022 ? ?Follow-up: 4 weeks ? ?Dr Jonathon Bellows MD,MRCP [facility]) ?Gastroenterology/Hepatology ?Pager: [phone] ?  ?

## 2021-06-05 NOTE — Progress Notes (Signed)
This encounter was created in error - please disregard.

## 2021-06-13 ENCOUNTER — Other Ambulatory Visit: Payer: Self-pay

## 2021-06-19 ENCOUNTER — Ambulatory Visit: Payer: Medicaid Other | Admitting: Gerontology

## 2021-06-19 ENCOUNTER — Encounter: Payer: Self-pay | Admitting: Gerontology

## 2021-06-19 ENCOUNTER — Other Ambulatory Visit: Payer: Self-pay

## 2021-06-19 VITALS — BP 126/79 | HR 78 | Temp 97.6°F | Resp 16 | Ht 65.0 in | Wt 146.3 lb

## 2021-06-19 DIAGNOSIS — Z8659 Personal history of other mental and behavioral disorders: Secondary | ICD-10-CM

## 2021-06-19 DIAGNOSIS — G8929 Other chronic pain: Secondary | ICD-10-CM | POA: Insufficient documentation

## 2021-06-19 DIAGNOSIS — M545 Low back pain, unspecified: Secondary | ICD-10-CM

## 2021-06-19 DIAGNOSIS — R350 Frequency of micturition: Secondary | ICD-10-CM

## 2021-06-19 DIAGNOSIS — F418 Other specified anxiety disorders: Secondary | ICD-10-CM | POA: Insufficient documentation

## 2021-06-19 LAB — POCT URINALYSIS DIPSTICK
Bilirubin, UA: NEGATIVE
Blood, UA: NEGATIVE
Glucose, UA: NEGATIVE
Ketones, UA: NEGATIVE
Leukocytes, UA: NEGATIVE
Nitrite, UA: NEGATIVE
Protein, UA: NEGATIVE
Spec Grav, UA: 1.02 (ref 1.010–1.025)
Urobilinogen, UA: 0.2 E.U./dL
pH, UA: 7 (ref 5.0–8.0)

## 2021-06-19 LAB — GLUCOSE, POCT (MANUAL RESULT ENTRY): POC Glucose: 98 mg/dl (ref 70–99)

## 2021-06-19 MED ORDER — GABAPENTIN 400 MG PO CAPS
400.0000 mg | ORAL_CAPSULE | Freq: Three times a day (TID) | ORAL | 1 refills | Status: DC
  Filled 2021-06-19: qty 30, 10d supply, fill #0
  Filled 2021-06-19: qty 90, 30d supply, fill #0
  Filled 2021-06-30: qty 30, 10d supply, fill #0
  Filled 2021-07-10: qty 30, 10d supply, fill #1
  Filled 2021-07-22: qty 90, 30d supply, fill #2

## 2021-06-19 NOTE — Progress Notes (Signed)
? ?Established Patient Office Visit ? ?Subjective   ?Patient ID: Alvin Ota., male    DOB: 1961-01-01  Age: 61 y.o. MRN: [id number] ? ?Chief Complaint  ?Patient presents with  ? malodorous urine  ?  Patient c/o foul smelling urine and urinary frequency x 1 week. Patient states his girlfriend has a UTI also.  ? Insomnia  ?  Patient requesting Elavil  ? nerve pain  ?  Patient requesting Gabapentin  ? ? ?HPI ? ?Alvin Ota. Is a 61 y/o male who has history of Anxiety, Depression,Hypertension, Chronic back pain presents for routine follow up visit . Currently, he c/o urinary frequency, urgency, odor to his urine that started 1 week ago. He denies pelvic & flank pain, penile discharge, fever and chills. He states that his girlfriend has UTI. He doesn't use protection during sexual intercourse. 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 He states that he injured his back when he fell from a tree in Dec 2016. He states that pain is constant, sharp, radiating to his shoulder and intensity increases from 6-10. CT scan done on 01/25/20 showed Again demonstrated are features of severe multilevel degenerative disc disease and spondylosis involving all segments from C2-C7 with disc space narrowing, prominent marginal osteophytosis and endplate sclerosis. There is also degenerative spurring at the anterior C1-2 articulation. Bone density appears normal. No fracture or subluxation is seen. He denies bladder /bowel incontinence and saddle anesthesia.  He states that it's difficult sitting down for many hours and has not followed up at the pain clinic. He also states that he can't participate in group therapy at [facility], states that his mood is dysphoric, denies suicidal nor homicidal ideation. Overall, he states that he's doing well, but concerned about his back pain. ? ?Review of Systems  ?Respiratory: Negative.    ?Cardiovascular: Negative.   ?Genitourinary:  Positive for  frequency and urgency. Negative for dysuria, flank pain and hematuria.  ?Musculoskeletal:  Positive for back pain (chronic back pain).  ?Neurological: Negative.   ?Psychiatric/Behavioral:    ?     Dysphoric mood  ? ?  ?Objective:  ?  ? ?BP 126/79 (BP Location: Left Arm, Patient Position: Sitting, Cuff Size: Large)   Pulse 78   Temp 97.6 ?F (36.4 ?C) (Oral)   Resp 16   Ht 5' 5"  (1.651 m)   Wt 146 lb 4.8 oz (66.4 kg)   SpO2 96%   BMI 24.35 kg/m?  ?BP Readings from Last 3 Encounters:  ?06/19/21 126/79  ?06/02/21 (!) 148/91  ?05/22/21 (!) 144/88  ? ?Wt Readings from Last 3 Encounters:  ?06/19/21 146 lb 4.8 oz (66.4 kg)  ?06/02/21 148 lb 12.8 oz (67.5 kg)  ?05/22/21 153 lb (69.4 kg)  ? ?  ? ?Physical Exam ?HENT:  ?   Head: Normocephalic and atraumatic.  ?Cardiovascular:  ?   Rate and Rhythm: Normal rate and regular rhythm.  ?   Pulses: Normal pulses.  ?   Heart sounds: Normal heart sounds.  ?Pulmonary:  ?   Effort: Pulmonary effort is normal.  ?   Breath sounds: Normal breath sounds.  ?Musculoskeletal:     ?   General: Tenderness (to mid back with palpation) present.  ?Skin: ?   General: Skin is warm.  ?Neurological:  ?   General: No focal deficit present.  ?   Mental Status: He is alert and oriented to person, place, and time. Mental status  is at baseline.  ?Psychiatric:     ?   Mood and Affect: Mood normal.     ?   Behavior: Behavior normal.     ?   Thought Content: Thought content normal.     ?   Judgment: Judgment normal.  ? ? ? ?Results for orders placed or performed in visit on 06/19/21  ?POCT Urinalysis Dipstick  ?Result Value Ref Range  ? Color, UA yellow   ? Clarity, UA clear   ? Glucose, UA Negative Negative  ? Bilirubin, UA neg   ? Ketones, UA neg   ? Spec Grav, UA 1.020 1.010 - 1.025  ? Blood, UA neg   ? pH, UA 7.0 5.0 - 8.0  ? Protein, UA Negative Negative  ? Urobilinogen, UA 0.2 0.2 or 1.0 E.U./dL  ? Nitrite, UA neg   ? Leukocytes, UA Negative Negative  ? Appearance    ? Odor    ?POCT Glucose (CBG)   ?Result Value Ref Range  ? POC Glucose 98 70 - 99 mg/dl  ? ? ?Last CBC ?Lab Results  ?Component Value Date  ? WBC 7.2 11/01/2016  ? HGB 15.4 11/01/2016  ? HCT 43.8 11/01/2016  ? MCV 84.1 11/01/2016  ? MCH 29.7 11/01/2016  ? RDW 14.4 11/01/2016  ? PLT 319 11/01/2016  ? ?Last metabolic panel ?Lab Results  ?Component Value Date  ? GLUCOSE 132 (H) 01/23/2021  ? NA 144 01/23/2021  ? K 3.9 01/23/2021  ? CL 103 01/23/2021  ? CO2 25 01/23/2021  ? BUN 13 01/23/2021  ? CREATININE 0.91 01/23/2021  ? EGFR 98 01/23/2021  ? CALCIUM 9.6 01/23/2021  ? PHOS 3.4 02/18/2014  ? PROT 7.1 01/23/2021  ? ALBUMIN 4.6 01/23/2021  ? LABGLOB 2.5 01/23/2021  ? AGRATIO 1.8 01/23/2021  ? BILITOT 0.2 01/23/2021  ? ALKPHOS 126 (H) 01/23/2021  ? AST 20 01/23/2021  ? ALT 22 01/23/2021  ? ANIONGAP 9 11/01/2016  ? ?Last lipids ?Lab Results  ?Component Value Date  ? CHOL 135 01/23/2021  ? HDL 28 (L) 01/23/2021  ? [place] 72 01/23/2021  ? TRIG 212 (H) 01/23/2021  ? CHOLHDL 4.8 01/23/2021  ? ?Last hemoglobin A1c ?Lab Results  ?Component Value Date  ? HGBA1C 5.9 (H) 01/23/2021  ? ?  ? ?The 10-year ASCVD risk score (Arnett DK, et al., 2019) is: 15.5% ? ?  ?Assessment & Plan:  ? ?1. Urinary frequency ?- Might be due to diuretic, his urinalysis was normal, blood glucose was 98 mg/dl. ?- POCT Urinalysis Dipstick ?- POCT Glucose (CBG); Future ?- POCT Glucose (CBG) ? ?2. Chronic low back pain, unspecified back pain laterality, unspecified whether sciatica present ?- He was started on gabapentin 400 mg tid, will follow up with Dr Jefm Bryant and Pain clinic. He was advised to go to the Ed for worsening symptoms. ?- gabapentin (NEURONTIN) 400 MG capsule; Take 1 capsule (400 mg total) by mouth 3 (three) times daily.  Dispense: 90 capsule; Refill: 1 ? ? ? ?3. History of depression ?- He was encouraged to follow up at [facility], will consult with Psychiatrist Dr Octavia Heir, he was advised to call the Crisis help line with worsening symptoms. ? ?Return in about 3 weeks (around  07/10/2021), or if symptoms worsen or fail to improve.  ? ? ?Darrien Laakso Jerold Coombe, NP ? ?

## 2021-06-20 ENCOUNTER — Other Ambulatory Visit: Payer: Self-pay

## 2021-06-25 ENCOUNTER — Inpatient Hospital Stay: Payer: Medicaid Other

## 2021-06-25 ENCOUNTER — Inpatient Hospital Stay: Payer: Medicaid Other | Attending: Oncology | Admitting: Licensed Clinical Social Worker

## 2021-06-25 ENCOUNTER — Encounter: Payer: Self-pay | Admitting: Licensed Clinical Social Worker

## 2021-06-25 DIAGNOSIS — Z808 Family history of malignant neoplasm of other organs or systems: Secondary | ICD-10-CM

## 2021-06-25 DIAGNOSIS — Z801 Family history of malignant neoplasm of trachea, bronchus and lung: Secondary | ICD-10-CM

## 2021-06-25 DIAGNOSIS — Z86010 Personal history of colonic polyps: Secondary | ICD-10-CM

## 2021-06-25 DIAGNOSIS — Z800 Family history of malignant neoplasm of digestive organs: Secondary | ICD-10-CM

## 2021-06-25 NOTE — Progress Notes (Signed)
REFERRING PROVIDER: ?Jonathon Bellows, MD ?[address] ?[address] ?[place],  [place] [address] ? ?PRIMARY PROVIDER:  ?Langston Reusing, NP ? ?PRIMARY REASON FOR VISIT:  ?1. Personal history of colonic polyps   ?2. Family history of stomach cancer   ?3. Family history of lung cancer   ? ? ? ?HISTORY OF PRESENT ILLNESS:   ?Alvin Gonzalez, a 61 y.o. male, was seen for a [facility] cancer genetics consultation at the request of Dr. Vicente Males due to a personal history of colon polyps.  Alvin Gonzalez presents to clinic today to discuss the possibility of a hereditary predisposition to cancer, genetic testing, and to further clarify his future cancer risks, as well as potential cancer risks for family members.  ? ?Alvin Gonzalez is a 61 y.o. male with no personal history of cancer.   ? ?He had a colonoscopy in 2023 that showed 10 polyps, mostly tubular adenomas. He reports having two other colonoscopies in the past that did not show polyps.  ? ?Past Medical History:  ?Diagnosis Date  ? Anxiety   ? Chronic back pain   ? Depression   ? Depression   ? Hypertension   ? Intermittent explosive disorder   ? Suicide attempt [facility])   ? prior to 02/18/14 admission   ? Tobacco abuse   ? ? ?Past Surgical History:  ?Procedure Laterality Date  ? "broken back"    ? CHEST TUBE INSERTION    ? COLONOSCOPY WITH PROPOFOL N/A 05/14/2021  ? Procedure: COLONOSCOPY WITH PROPOFOL;  Surgeon: Jonathon Bellows, MD;  Location: [facility] ENDOSCOPY;  Service: Gastroenterology;  Laterality: N/A;  ? LYMPH NODE BIOPSY    ? ?FAMILY HISTORY:  ?We obtained a detailed, 4-generation family history.  Significant diagnoses are listed below: ?Family History  ?Problem Relation Age of Onset  ? Diabetes Mother   ? Hypertension Mother   ? Other Mother   ?     possible ovarian cancer  ? Heart disease Father   ? Hypertension Father   ? Cirrhosis Father   ? Liver cancer Father   ? Lung cancer Sister   ? Heart attack Maternal Grandmother   ? Suicidality Maternal Grandfather   ? Other Paternal Grandmother    ?     unknown medical history  ? Suicidality Paternal Grandfather   ? Stomach cancer Cousin   ? ?Alvin Gonzalez has 2 sisters, he does not have contact with them. One did have lung cancer. ? ?Alvin Gonzalez's mother is living at 38 and possibly had ovarian cancer. Patient is unaware of cancers in maternal aunts/uncles/grandparents but has limited information about them. He is aware of a maternal cousin who recently passed from stomach cancer. ? ?Alvin Gonzalez father had liver cancer and is in his 61s. He is unaware of any other cancers on this side of the family but had limited information about this side.  ? ?Alvin Gonzalez is unaware of previous family history of genetic testing for hereditary cancer risks.  There is no reported Ashkenazi Jewish ancestry. There is no known consanguinity. ? ? ?GENETIC COUNSELING ASSESSMENT: [name] is a 61 y.o. male with a personal history of colon polyps which is somewhat suggestive of a hereditary polyposis syndrome and predisposition to cancer. We, therefore, discussed and recommended the following at today's visit.  ? ?DISCUSSION: We discussed that polyps in general are common, however, most people have fewer than 5 lifetime polyps.  When an individual has 10 or more polyps we become concerned about an  underlying polyposis syndrome.  The most common hereditary polyposis syndromes are caused by problems in the APC and MUTYH genes. We discussed that testing is beneficial for several reasons including knowing how to follow individuals for cancer screenings, and understand if other family members could be at risk for cancer and allow them to undergo genetic testing.  ? ?We reviewed the characteristics, features and inheritance patterns of hereditary cancer syndromes. We also discussed genetic testing, including the appropriate family members to test, the process of testing, insurance coverage and turn-around-time for results. We discussed the implications of a negative, positive and/or variant of  uncertain significant result. We recommended Alvin Gonzalez pursue genetic testing for the [facility] Multi-Cancer+RNA gene panel.  ? ?Based on Alvin Gonzalez personal history of colon polyps he meets medical criteria for genetic testing. Despite that he meets criteria, he may still have an out of pocket cost. We discussed that if his out of pocket cost for testing is over $100, the laboratory will call and confirm whether he wants to proceed with testing.  If the out of pocket cost of testing is less than $100 he will be billed by the genetic testing laboratory.  ? ?PLAN: After considering the risks, benefits, and limitations, Alvin Gonzalez provided informed consent to pursue genetic testing and the blood sample was sent to [facility] for analysis of the Multi-Cancer+RNA panel. Results should be available within approximately 2-3 weeks' time, at which point they will be disclosed by telephone to Alvin Gonzalez, as will any additional recommendations warranted by these results. Alvin Gonzalez will receive a summary of his genetic counseling visit and a copy of his results once available. This information will also be available in Epic.  ? ?Alvin Gonzalez's questions were answered to his satisfaction today. Our contact information was provided should additional questions or concerns arise. Thank you for the referral and allowing [place] to share in the care of your patient.  ? ?Faith Rogue, MS, LCGC ?Genetic Counselor ?[name] ?Phone: [phone] ? ?The patient was seen for a total of 25 minutes in face-to-face genetic counseling.  Dr. Grayland Ormond was available for discussion regarding this case.  ? ?_______________________________________________________________________ ?For Office Staff:  ?Number of people involved in session: 1 ?Was an Intern/ student involved with case: no ? ?

## 2021-06-30 ENCOUNTER — Other Ambulatory Visit: Payer: Self-pay

## 2021-07-01 ENCOUNTER — Ambulatory Visit: Payer: Medicaid Other | Admitting: Rheumatology

## 2021-07-01 ENCOUNTER — Encounter: Payer: Self-pay | Admitting: Gastroenterology

## 2021-07-01 ENCOUNTER — Ambulatory Visit (INDEPENDENT_AMBULATORY_CARE_PROVIDER_SITE_OTHER): Payer: Self-pay | Admitting: Gastroenterology

## 2021-07-01 VITALS — BP 127/85 | HR 87 | Temp 98.3°F | Wt 145.6 lb

## 2021-07-01 DIAGNOSIS — M5000 Cervical disc disorder with myelopathy, unspecified cervical region: Secondary | ICD-10-CM

## 2021-07-01 DIAGNOSIS — K648 Other hemorrhoids: Secondary | ICD-10-CM

## 2021-07-01 NOTE — Progress Notes (Signed)
[facility]  PROGRESS NOTE  Patient:Alvin Gonzalez Der. Male   DOB:[birth year]     61 y.o.  YNW:[id number]  Visit Date: 07/01/2021  HPI:  61 year old white male.  Prior tree worker.  Applying for disability.  Has not worked in several years History of hypertension depression.  Rectal bleeding.  Adenomatous.  Recent colonoscopy.  Had fall from tree in 2016.  Had right humeral fracture not operated.  Cervical disc disease without fracture.  Thoracic spine fractures x2 rib fractures. Previously seen at [facility].  Was offered surgery both for shoulders neck and declined.  Prior opioid use.  Currently on gabapentin for chronic pain Several issues of chronic pain.  Neck pain.  Mid and low back pain.  Worse with activity Hands go numb at night.  Sometimes feels like he has to shake them.  Not much during the day.  Has not had any significant weakness in the upper or lower extremities.  Does not use cane or walker Had cervical spine injections and "burning the nerves at [facility] he will never have that again. Prior physical therapy but none recently Supposed to hear about disability in the next day or 2 Transaminitis.  Prior substance abuse     Past Medical History:  Diagnosis Date   Anxiety    Chronic back pain    Depression    Depression    Hypertension    Intermittent explosive disorder    Suicide attempt ([place])    prior to 02/18/14 admission    Tobacco abuse     Past Surgical History:  Procedure Laterality Date   "broken back"     CHEST TUBE INSERTION     COLONOSCOPY WITH PROPOFOL N/A 05/14/2021   Procedure: COLONOSCOPY WITH PROPOFOL;  Surgeon: Jonathon Bellows, MD;  Location: [facility] ENDOSCOPY;  Service: Gastroenterology;  Laterality: N/A;   LYMPH NODE BIOPSY      Social History   Tobacco Use   Smoking status: Every Day    Packs/day: 1.00    Years: 45.00    Pack years: 45.00    Types: Cigarettes   Smokeless tobacco: Former    Quit date: 01/30/2015   Substance Use Topics   Alcohol use: Not Currently    Comment: 1-2 beers per week, last use 2018, drank 2 beers on his birthday this year     MEDICATIONS: Current Outpatient Medications  Medication Sig Dispense Refill   amLODipine (NORVASC) 10 MG tablet Take 1 tablet (10 mg total) by mouth once daily. 30 tablet 2   aspirin 81 MG chewable tablet Chew 81 mg by mouth daily. (Patient not taking: Reported on 06/19/2021)     cyclobenzaprine (FLEXERIL) 5 MG tablet Take 1 tablet ('5mg'$  total) by mouth 3 (three) times daily as needed for muscle spasms. 90 tablet 2   diphenhydramine-acetaminophen (TYLENOL PM) 25-500 MG TABS tablet Take 1 tablet by mouth at bedtime. Insomnia     gabapentin (NEURONTIN) 400 MG capsule Take 1 capsule (400 mg total) by mouth 3 (three) times daily. 90 capsule 1   hydrochlorothiazide (HYDRODIURIL) 25 MG tablet Take 1 tablet (25 mg total) by mouth once daily. 30 tablet 2   lisinopril (ZESTRIL) 30 MG tablet TAKE 1 TABLET BY MOUTH ONCE DAILY. 30 tablet 2   pantoprazole (PROTONIX) 40 MG tablet Take 1 tablet (40 mg total) by mouth once daily. 30 tablet 2   sertraline (ZOLOFT) 100 MG tablet Take 1 tablet by mouth daily. (Patient not taking: Reported on  06/19/2021)     UNABLE TO FIND "Off brand arthritis medicine"     No current facility-administered medications for this visit.     ALLERGIES Allergies  Allergen Reactions   Carbamazepine Rash and Hives   Ibuprofen Nausea And Vomiting and Rash    GI Bleeding    Risperidone Swelling    Tongue swelling Tongue swelling Tongue swelling Tongue swelling? Pt self report   Naproxen Other (See Comments)    stomach bleeding stomach bleeding stomach bleeding     PHYSICAL EXAM: There were no vitals taken for this visit. Pleasant male.  No acute distress.  Does not use cane or walker.  Musculoskeletal: Decreased range of motion cervical spine.  Both shoulders have mild decreased abduction external rotation.  Elbows without  synovitis but hands without synovitis.  Plus minus Tinel's.  Negative Phalen's.  No significant decreased pin.  Reasonable grip.  Back has mild kyphosis.  Flexes to about 8 inches from the floor.  Decreased lateral bending.  Hips move well.  No knee effusions.  Ankles and toes without synovitis Neurologic: Symmetric hyporeflexic upper extremities but no 5/5 power.  1-2+ knee and ankle jerks.  No clonus.  Negative straight leg raising.   ASSESSMENT: Chronic pain from prior injury and degenerative changes.  Cervical degenerative disc disease Prior thoracic compression fractures, traumatic hand numbness at night.  Could be from cervical disc disease or from carpal tunnel Prior substance abuse.  Prior cervical epidurals and nerve burning declines further injections    PLAN: Referral to [facility] physical therapy We issued bilateral wrist splints to see if it helps nighttime numbness If he gets his disability and then Medicare then may need pain clinic referral and possibly work-up with imaging of his cervical spine if he is willing to have procedures    G. Fayrene Fearing. MD           07/01/2021,  9:17 AM

## 2021-07-01 NOTE — Patient Instructions (Signed)
Wear wrist splints each night [facility]

## 2021-07-01 NOTE — Progress Notes (Signed)
Patient follow-ups today for banding of hemorrhoids    Summary of history : He has had issues with rectal bleeding with blood on the tissue paper ongoing on and off for more than a few months.    He has used medicated wipes which has not helped.  Denies any perianal itching.  .05/14/2021: Colonoscopy 10 polyps resected multiple adenomas, large hemorrhoids    First round:04/30/2021: LL column banded  Second round:06/02/2021: RA column banded    Interval history  06/02/2021-07/01/2021 Doing well no issues    Digital rectal exam performed in the presence of a chaperone. External anal findings: none  Internal findings: , No masses, no blood on glove noticed.    PROCEDURE NOTE: The patient presents with symptomatic grade 1 hemorrhoids, unresponsive to maximal medical therapy, requesting rubber band ligation of his/her hemorrhoidal disease.  All risks, benefits and alternative forms of therapy were described and informed consent was obtained.  In the Left Lateral Decubitus position (if anoscopy is performed) anoscopic examination revealed grade 1 hemorrhoids in the RP position(s).   The decision was made to band the RP internal hemorrhoid, and the [place] was used to perform band ligation without complication.  Digital anorectal examination was then performed to assure proper positioning of the band, and to adjust the banded tissue as required.  The patient was discharged home without pain or other issues.  Dietary and behavioral recommendations were given and (if necessary - prescriptions were given), along with follow-up instructions.  The patient will return     as needed for follow-up and possible additional banding as required.  No complications were encountered and the patient tolerated the procedure well.   Plan:  Avoid constipation.  Commence on stool softeners if not already on  Follow-up:as needed  Dr Jonathon Bellows MD,MRCP [facility]) Gastroenterology/Hepatology Pager:  [phone]

## 2021-07-10 ENCOUNTER — Other Ambulatory Visit: Payer: Self-pay

## 2021-07-10 ENCOUNTER — Encounter: Payer: Self-pay | Admitting: Nurse Practitioner

## 2021-07-10 ENCOUNTER — Ambulatory Visit: Payer: Medicaid Other | Admitting: Gerontology

## 2021-07-10 ENCOUNTER — Encounter: Payer: Self-pay | Admitting: Gerontology

## 2021-07-10 ENCOUNTER — Ambulatory Visit: Payer: Self-pay | Admitting: Nurse Practitioner

## 2021-07-10 VITALS — BP 107/75 | HR 87 | Temp 97.6°F | Resp 16 | Ht 65.0 in | Wt 152.6 lb

## 2021-07-10 DIAGNOSIS — Z87898 Personal history of other specified conditions: Secondary | ICD-10-CM

## 2021-07-10 DIAGNOSIS — Z113 Encounter for screening for infections with a predominantly sexual mode of transmission: Secondary | ICD-10-CM

## 2021-07-10 DIAGNOSIS — F339 Major depressive disorder, recurrent, unspecified: Secondary | ICD-10-CM

## 2021-07-10 DIAGNOSIS — F419 Anxiety disorder, unspecified: Secondary | ICD-10-CM

## 2021-07-10 LAB — GRAM STAIN

## 2021-07-10 NOTE — Progress Notes (Signed)
Pt here for STD screening.  Gram stain results reviewed, no treatment required per SO.  Condoms given.  Windle Guard, RN

## 2021-07-10 NOTE — Progress Notes (Addendum)
[facility] Department STI clinic/screening visit  Subjective:  Alvin Gonzalez. is a 61 y.o. male being seen today for an STI screening visit. The patient reports they do have symptoms.    Patient has the following medical conditions:   Patient Active Problem List   Diagnosis Date Noted   Chronic back pain 06/19/2021   History of depression 06/19/2021   History of insomnia 05/22/2021   Tooth ache 05/22/2021   Sinus pressure 03/20/2021   Acid reflux 02/20/2021   History of gastroesophageal reflux (GERD) 02/20/2021   Prediabetes 02/20/2021   Erectile dysfunction 03/26/2017   Wheezing 02/16/2017   Neck muscle spasm 01/25/2017   Pulmonary nodule 12/10/2016   At risk for heart disease 10/28/2016   Weight loss, non-intentional 06/19/2016   Penile abnormality 02/04/2016   Tobacco use disorder 02/04/2016   Risk for falls 10/23/2015   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 Has noticed dripping from penis into underwear    HPI  Patient reports to clinic today for an STD screening. Patient reports discharge for 2 weeks.   Does the patient or their partner desires a pregnancy in the next year? No  Screening for MPX risk: Does the patient have an unexplained rash? No Is the patient MSM? No Does the patient endorse multiple sex partners or anonymous sex partners? No Did the patient have close  or sexual contact with a person diagnosed with MPX? No Has the patient traveled outside the [place] where MPX is endemic? No Is there a high clinical suspicion for MPX-- evidenced by one of the following No  -Unlikely to be chickenpox  -Lymphadenopathy  -Rash that present in same phase of evolution on any given body part   See flowsheet for further details and programmatic requirements.   Immunization History  Administered Date(s) Administered   Influenza,inj,Quad PF,6+ Mos 10/23/2015, 10/28/2016   Pneumococcal Polysaccharide-23 02/09/2010     The following portions of the patient's history were reviewed and updated as appropriate: allergies, current medications, past medical history, past social history, past surgical history and problem list.  Objective:  There were no vitals filed for this visit.  Physical Exam Constitutional:      Appearance: Normal appearance.  HENT:     Head: Normocephalic.     Right Ear: External ear normal.     Left Ear: External ear normal.     Nose: Nose normal.     Mouth/Throat:     Lips: Pink.     Mouth: Mucous membranes are moist.     Comments: Poor dentition   Neck:      Comments: Swollen neck glands bilaterally  Pulmonary:     Effort: Pulmonary effort is normal.  Abdominal:     General: Abdomen is flat.     Palpations: Abdomen is soft.  Genitourinary:    Penis: Circumcised.      Comments: Pubic area without nits, lice, hair loss, edema, erythema,  lesions and inguinal adenopathy. Penis without rash, lesions and discharge at meatus. Testicles descended bilaterally,nt, no masses or edema.  Musculoskeletal:     Cervical back: Full passive range of motion without pain and normal range of motion.  Skin:    General: Skin is warm and dry.  Neurological:     Mental Status: He is alert and oriented to person, place, and time.  Psychiatric:        Attention and Perception: Attention normal.        Mood and Affect: Mood normal.        Speech:  Speech normal.        Behavior: Behavior normal. Behavior is cooperative.      Assessment and Plan:  Alvin Gonzalez. is a 61 y.o. male presenting to the [facility] Department for STI screening  1. Screening examination for venereal disease -61 year old male in clinic for STD screening. -Patient does have STI symptoms Patient accepted all screenings including  oral and urethra GC and gram stain, declines bloodwork for HIV/RPR.  Patient meets criteria for HepB screening? Yes. Ordered? No - refused Patient meets criteria for HepC screening? Yes. Ordered? No - refused Recommended condom use with all sex Discussed importance of condom use for STI prevent  Treat gram stain per standing order Discussed time line for State Lab results and that patient will be called with positive results and encouraged patient to call if he had not heard in 2 weeks Recommended returning for continued or worsening symptoms.    - Gram stain - Gonococcus culture - Gonococcus culture  2. Anxiety -History of anxiety and depression due to trauma.  Behavioral Referral submitted.   - Ambulatory referral to [facility]  3. Depression, recurrent ([place]) -History of anxiety and depression due to trauma.  Behavioral Referral submitted.   - Ambulatory referral to [facility]    Return if symptoms worsen or fail to improve.  Future Appointments  Date Time Provider [place]  07/16/2021 11:30 AM [place], Lonny Prude, NP [facility] None    Gregary Cromer, FNP

## 2021-07-10 NOTE — Progress Notes (Signed)
Established Patient Office Visit  Subjective   Patient ID: Alvin Gonzalez., male    DOB: 07-May-1960  Age: 61 y.o. MRN: [id number]  Chief Complaint  Patient presents with   Follow-up   Medication Refill    Gabapentin   Insomnia    Patient requesting Elavil     HPI  Alvin Gonzalez. Is a 61 y/o male who has history of Anxiety, Depression,Hypertension, Chronic back pain presents for routine follow up visit and c/o of insomnia, stating that elavil is the only medication that was effective for his insomnia.  He states that he sleeps 2 hours every night, and he has difficulty staying asleep. He goes to bed at 8 pm and wakes up at 11 pm tossing and turning. He states that he was prescribed Elvail for pain while incarcerated in 2013 to 2019. He states that he smoked marijuana last on 05/19/21  and has not had any other substance since then. He declines going for group therapy at [facility]. Overall, he states that he's concerned with not getting enough sleep.  Review of Systems  Constitutional: Negative.   Respiratory: Negative.    Cardiovascular: Negative.   Neurological: Negative.   Psychiatric/Behavioral:  The patient has insomnia.      Objective:     BP 107/75 (BP Location: Right Arm, Patient Position: Sitting, Cuff Size: Large)   Pulse 87   Temp 97.6 F (36.4 C) (Oral)   Resp 16   Ht _0  (1.651 m)   Wt 152 lb 9.6 oz (69.2 kg)   SpO2 95%   BMI 25.39 kg/m  BP Readings from Last 3 Encounters:  07/10/21 107/75  07/01/21 127/85  06/19/21 126/79   Wt Readings from Last 3 Encounters:  07/10/21 152 lb 9.6 oz (69.2 kg)  07/01/21 145 lb 9.6 oz (66 kg)  06/19/21 146 lb 4.8 oz (66.4 kg)      Physical Exam HENT:     Head: Normocephalic and atraumatic.  Cardiovascular:     Rate and Rhythm: Normal rate and regular rhythm.     Pulses: Normal pulses.     Heart sounds: Normal heart sounds.  Pulmonary:     Effort: Pulmonary effort is normal.     Breath sounds: Normal breath  sounds.  Neurological:     General: No focal deficit present.     Mental Status: He is alert and oriented to person, place, and time. Mental status is at baseline.  Psychiatric:        Mood and Affect: Mood normal.        Behavior: Behavior normal.        Thought Content: Thought content normal.        Judgment: Judgment normal.     No results found for any visits on 07/10/21.  Last CBC Lab Results  Component Value Date   WBC 7.2 11/01/2016   HGB 15.4 11/01/2016   HCT 43.8 11/01/2016   MCV 84.1 11/01/2016   MCH 29.7 11/01/2016   RDW 14.4 11/01/2016   PLT 319 26/94/8546   Last metabolic panel Lab Results  Component Value Date   GLUCOSE 132 (H) 01/23/2021   NA 144 01/23/2021   K 3.9 01/23/2021   CL 103 01/23/2021   CO2 25 01/23/2021   BUN 13 01/23/2021   CREATININE 0.91 01/23/2021   EGFR 98 01/23/2021   CALCIUM 9.6 01/23/2021   PHOS 3.4 02/18/2014   PROT 7.1 01/23/2021   ALBUMIN 4.6 01/23/2021  LABGLOB 2.5 01/23/2021   AGRATIO 1.8 01/23/2021   BILITOT 0.2 01/23/2021   ALKPHOS 126 (H) 01/23/2021   AST 20 01/23/2021   ALT 22 01/23/2021   ANIONGAP 9 11/01/2016   Last lipids Lab Results  Component Value Date   CHOL 135 01/23/2021   HDL 28 (L) 01/23/2021   LDLCALC 72 01/23/2021   TRIG 212 (H) 01/23/2021   CHOLHDL 4.8 01/23/2021   Last hemoglobin A1c Lab Results  Component Value Date   HGBA1C 5.9 (H) 01/23/2021   Last thyroid functions No results found for: TSH, T3TOTAL, T4TOTAL, THYROIDAB    The 10-year ASCVD risk score (Arnett DK, et al., 2019) is: 11.8%    Assessment & Plan:   1. History of insomnia - He declines going to [facility] for group therapy, and taking Melatonin. Will consult with Dr Octavia Heir next week prior to his appointment. He was advised to go to the ED for worsening symptoms.  Return in about 6 days (around 07/16/2021), or if symptoms worsen or fail to improve.    Kaori Jumper Jerold Coombe, NP

## 2021-07-10 NOTE — Patient Instructions (Signed)
Insomnia Insomnia is a sleep disorder that makes it difficult to fall asleep or stay asleep. Insomnia can cause fatigue, low energy, difficulty concentrating, mood swings, and poor performance at work or school. There are three different ways to classify insomnia: Difficulty falling asleep. Difficulty staying asleep. Waking up too early in the morning. Any type of insomnia can be long-term (chronic) or short-term (acute). Both are common. Short-term insomnia usually lasts for 3 months or less. Chronic insomnia occurs at least three times a week for longer than 3 months. What are the causes? Insomnia may be caused by another condition, situation, or substance, such as: Having certain mental health conditions, such as anxiety and depression. Using caffeine, alcohol, tobacco, or drugs. Having gastrointestinal conditions, such as gastroesophageal reflux disease (GERD). Having certain medical conditions. These include: Asthma. Alzheimer's disease. Stroke. Chronic pain. An overactive thyroid gland (hyperthyroidism). Other sleep disorders, such as restless legs syndrome and sleep apnea. Menopause. Sometimes, the cause of insomnia may not be known. What increases the risk? Risk factors for insomnia include: Gender. Females are affected more often than males. Age. Insomnia is more common as people get older. Stress and certain medical and mental health conditions. Lack of exercise. Having an irregular work schedule. This may include working night shifts and traveling between different time zones. What are the signs or symptoms? If you have insomnia, the main symptom is having trouble falling asleep or having trouble staying asleep. This may lead to other symptoms, such as: Feeling tired or having low energy. Feeling nervous about going to sleep. Not feeling rested in the morning. Having trouble concentrating. Feeling irritable, anxious, or depressed. How is this diagnosed? This condition  may be diagnosed based on: Your symptoms and medical history. Your health care provider may ask about: Your sleep habits. Any medical conditions you have. Your mental health. A physical exam. How is this treated? Treatment for insomnia depends on the cause. Treatment may focus on treating an underlying condition that is causing the insomnia. Treatment may also include: Medicines to help you sleep. Counseling or therapy. Lifestyle adjustments to help you sleep better. Follow these instructions at home: Eating and drinking  Limit or avoid alcohol, caffeinated beverages, and products that contain nicotine and tobacco, especially close to bedtime. These can disrupt your sleep. Do not eat a large meal or eat spicy foods right before bedtime. This can lead to digestive discomfort that can make it hard for you to sleep. Sleep habits  Keep a sleep diary to help you and your health care provider figure out what could be causing your insomnia. Write down: When you sleep. When you wake up during the night. How well you sleep and how rested you feel the next day. Any side effects of medicines you are taking. What you eat and drink. Make your bedroom a dark, comfortable place where it is easy to fall asleep. Put up shades or blackout curtains to block light from outside. Use a white noise machine to block noise. Keep the temperature cool. Limit screen use before bedtime. This includes: Not watching TV. Not using your smartphone, tablet, or computer. Stick to a routine that includes going to bed and waking up at the same times every day and night. This can help you fall asleep faster. Consider making a quiet activity, such as reading, part of your nighttime routine. Try to avoid taking naps during the day so that you sleep better at night. Get out of bed if you are still awake after   15 minutes of trying to sleep. Keep the lights down, but try reading or doing a quiet activity. When you feel  sleepy, go back to bed. General instructions Take over-the-counter and prescription medicines only as told by your health care provider. Exercise regularly as told by your health care provider. However, avoid exercising in the hours right before bedtime. Use relaxation techniques to manage stress. Ask your health care provider to suggest some techniques that may work well for you. These may include: Breathing exercises. Routines to release muscle tension. Visualizing peaceful scenes. Make sure that you drive carefully. Do not drive if you feel very sleepy. Keep all follow-up visits. This is important. Contact a health care provider if: You are tired throughout the day. You have trouble in your daily routine due to sleepiness. You continue to have sleep problems, or your sleep problems get worse. Get help right away if: You have thoughts about hurting yourself or someone else. Get help right away if you feel like you may hurt yourself or others, or have thoughts about taking your own life. Go to your nearest emergency room or: Call 911. Call the National Suicide Prevention Lifeline at 1-800-273-8255 or 988. This is open 24 hours a day. Text the Crisis Text Line at 741741. Summary Insomnia is a sleep disorder that makes it difficult to fall asleep or stay asleep. Insomnia can be long-term (chronic) or short-term (acute). Treatment for insomnia depends on the cause. Treatment may focus on treating an underlying condition that is causing the insomnia. Keep a sleep diary to help you and your health care provider figure out what could be causing your insomnia. This information is not intended to replace advice given to you by your health care provider. Make sure you discuss any questions you have with your health care provider. Document Revised: 01/06/2021 Document Reviewed: 01/06/2021 Elsevier Patient Education  2023 Elsevier Inc.  

## 2021-07-10 NOTE — Addendum Note (Signed)
Addended by: Gregary Cromer on: 07/10/2021 05:37 PM   Modules accepted: Orders

## 2021-07-14 LAB — GONOCOCCUS CULTURE

## 2021-07-16 ENCOUNTER — Other Ambulatory Visit: Payer: Self-pay | Admitting: Gerontology

## 2021-07-16 ENCOUNTER — Encounter: Payer: Self-pay | Admitting: Gerontology

## 2021-07-16 ENCOUNTER — Other Ambulatory Visit: Payer: Self-pay

## 2021-07-16 ENCOUNTER — Ambulatory Visit: Payer: Medicaid Other | Admitting: Gerontology

## 2021-07-16 VITALS — BP 113/73 | HR 74 | Temp 97.4°F | Resp 16 | Ht 65.0 in | Wt 152.2 lb

## 2021-07-16 DIAGNOSIS — Z87898 Personal history of other specified conditions: Secondary | ICD-10-CM

## 2021-07-16 DIAGNOSIS — I10 Essential (primary) hypertension: Secondary | ICD-10-CM

## 2021-07-16 DIAGNOSIS — Z8659 Personal history of other mental and behavioral disorders: Secondary | ICD-10-CM

## 2021-07-16 MED ORDER — AMITRIPTYLINE HCL 25 MG PO TABS
25.0000 mg | ORAL_TABLET | Freq: Every day | ORAL | 4 refills | Status: DC
  Filled 2021-07-16: qty 7, 7d supply, fill #0
  Filled 2021-07-22: qty 7, 7d supply, fill #1
  Filled 2021-08-01: qty 7, 7d supply, fill #2
  Filled 2021-08-07: qty 7, 7d supply, fill #3
  Filled 2021-08-19: qty 7, 7d supply, fill #4

## 2021-07-16 MED ORDER — HYDROCHLOROTHIAZIDE 25 MG PO TABS
25.0000 mg | ORAL_TABLET | Freq: Every day | ORAL | 2 refills | Status: DC
  Filled 2021-07-16: qty 30, 30d supply, fill #0

## 2021-07-16 NOTE — Progress Notes (Signed)
Established Patient Office Visit  Subjective   Patient ID: Alvin Line., male    DOB: 1960/11/19  Age: 61 y.o. MRN: [id number]  Chief Complaint  Patient presents with   Follow-up   Insomnia    HPI  Alvin Ota. Is a 61 y/o male who has history of Anxiety, Depression,Hypertension, Chronic back pain presents for routine follow up visit and c/o of insomnia. He states that he sleeps for 2 hours and it's difficult fin comfortable He states with his mother and sister, and he reports that he doesn't like his sister " states hates that bitch" He denies suicidal ideation. He declines group therapy at [facility] he reports that he acts before he thinks about it. Overall, he states that he's doing well and offers no further complaint.  Review of Systems  Constitutional: Negative.   Respiratory: Negative.    Cardiovascular: Negative.   Psychiatric/Behavioral:  Negative for suicidal ideas. The patient has insomnia.      Objective:     BP 113/73 (BP Location: Right Arm, Patient Position: Sitting, Cuff Size: Large)   Pulse 74   Temp (!) 97.4 F (36.3 C) (Oral)   Resp 16   Ht 5' 5"  (1.651 m)   Wt 152 lb 3.2 oz (69 kg)   SpO2 98%   BMI 25.33 kg/m  BP Readings from Last 3 Encounters:  07/16/21 113/73  07/10/21 107/75  07/01/21 127/85   Wt Readings from Last 3 Encounters:  07/16/21 152 lb 3.2 oz (69 kg)  07/10/21 152 lb 9.6 oz (69.2 kg)  07/01/21 145 lb 9.6 oz (66 kg)      Physical Exam Cardiovascular:     Rate and Rhythm: Normal rate and regular rhythm.     Pulses: Normal pulses.     Heart sounds: Normal heart sounds.  Pulmonary:     Effort: Pulmonary effort is normal.     Breath sounds: Normal breath sounds.  Psychiatric:        Mood and Affect: Mood normal.        Behavior: Behavior normal.        Thought Content: Thought content normal.        Judgment: Judgment normal.     No results found for any visits on 07/16/21.  Last CBC Lab Results  Component  Value Date   WBC 7.2 11/01/2016   HGB 15.4 11/01/2016   HCT 43.8 11/01/2016   MCV 84.1 11/01/2016   MCH 29.7 11/01/2016   RDW 14.4 11/01/2016   PLT 319 50/53/9767   Last metabolic panel Lab Results  Component Value Date   GLUCOSE 132 (H) 01/23/2021   NA 144 01/23/2021   K 3.9 01/23/2021   CL 103 01/23/2021   CO2 25 01/23/2021   BUN 13 01/23/2021   CREATININE 0.91 01/23/2021   EGFR 98 01/23/2021   CALCIUM 9.6 01/23/2021   PHOS 3.4 02/18/2014   PROT 7.1 01/23/2021   ALBUMIN 4.6 01/23/2021   LABGLOB 2.5 01/23/2021   AGRATIO 1.8 01/23/2021   BILITOT 0.2 01/23/2021   ALKPHOS 126 (H) 01/23/2021   AST 20 01/23/2021   ALT 22 01/23/2021   ANIONGAP 9 11/01/2016   Last lipids Lab Results  Component Value Date   CHOL 135 01/23/2021   HDL 28 (L) 01/23/2021   LDLCALC 72 01/23/2021   TRIG 212 (H) 01/23/2021   CHOLHDL 4.8 01/23/2021   Last hemoglobin A1c Lab Results  Component Value Date   HGBA1C 5.9 (H) 01/23/2021  Last thyroid functions No results found for: TSH, T3TOTAL, T4TOTAL, THYROIDAB    The 10-year ASCVD risk score (Arnett DK, et al., 2019) is: 12.9%    Assessment & Plan:   1. History of depression -Per consultation with Dr. Clovis Riley, he was started on 25 mg amitriptyline daily, was educated on medication side effects and advised to go to the emergency room.  He was also advised to follow-up at [facility] for mental health evaluation and treatment.  He was advised to call the crisis helpline with worsening symptoms. - amitriptyline (ELAVIL) 25 MG tablet; Take 1 tablet (25 mg total) by mouth at bedtime.  Dispense: 7 tablet; Refill: 4  2. History of insomnia -Per consultation with Dr. Clovis Riley, he was started on 25 mg amitriptyline daily, was educated on medication side effects and advised to go to the emergency room.  He was also advised to follow-up at [facility] for mental health evaluation and treatment.  He was advised to call the crisis helpline with worsening symptoms. -  amitriptyline (ELAVIL) 25 MG tablet; Take 1 tablet (25 mg total) by mouth at bedtime.  Dispense: 7 tablet; Refill: 4   Return in about 2 weeks (around 07/30/2021), or if symptoms worsen or fail to improve.    Alvin Shaheen Jerold Coombe, NP

## 2021-07-17 ENCOUNTER — Other Ambulatory Visit: Payer: Self-pay

## 2021-07-21 ENCOUNTER — Encounter: Payer: Self-pay | Admitting: Licensed Clinical Social Worker

## 2021-07-21 ENCOUNTER — Telehealth: Payer: Self-pay | Admitting: Licensed Clinical Social Worker

## 2021-07-21 ENCOUNTER — Ambulatory Visit: Payer: Self-pay | Admitting: Licensed Clinical Social Worker

## 2021-07-21 DIAGNOSIS — Z1379 Encounter for other screening for genetic and chromosomal anomalies: Secondary | ICD-10-CM | POA: Insufficient documentation

## 2021-07-21 NOTE — Telephone Encounter (Signed)
Revealed negative genetic testing.  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  We discussed that we do not know why he has had colon polyps or why there is cancer in the family. It could be due to a different gene that we are not testing, or something our current technology cannot pick up.  It will be important for him to keep in contact with genetics to learn if additional testing may be needed in the future.

## 2021-07-21 NOTE — Progress Notes (Signed)
HPI:  Alvin Gonzalez. Alvin Gonzalez was previously seen in the [place] [facility] due to a personal history of colon polyps and concerns regarding a hereditary predisposition to cancer. Please refer to our prior cancer genetics clinic note for more information regarding our discussion, assessment and recommendations, at the time. Alvin Gonzalez recent genetic Gonzalez results were disclosed to him, as were recommendations warranted by these results. These results and recommendations are discussed in more detail below.  CANCER HISTORY:  Oncology History   No history exists.    FAMILY HISTORY:  We obtained a detailed, 4-generation family history.  Significant diagnoses are listed below: Family History  Problem Relation Age of Onset   Diabetes Mother    Hypertension Mother    Other Mother        possible ovarian cancer   Heart disease Father    Hypertension Father    Cirrhosis Father    Liver cancer Father    Lung cancer Sister    Heart attack Maternal Grandmother    Suicidality Maternal Grandfather    Other Paternal Grandmother        unknown medical history   Suicidality Paternal Grandfather    Stomach cancer Cousin    Alvin Gonzalez has 2 sisters, he does not have contact with them. One did have lung cancer.   Alvin Gonzalez mother is living at 15 and possibly had ovarian cancer. Patient is unaware of cancers in maternal aunts/uncles/grandparents but has limited information about them. He is aware of a maternal cousin who recently passed from stomach cancer.   Alvin Gonzalez father had liver cancer and is in his 45s. He is unaware of any other cancers on this side of the family but had limited information about this side.    Alvin Gonzalez is unaware of previous family history of genetic testing for hereditary cancer risks.  There is no reported Ashkenazi Jewish ancestry. There is no known consanguinity.     GENETIC Gonzalez RESULTS: Genetic testing reported out on 07/19/2021 through the [facility] Multi-Cancer+RNA  cancer panel found no pathogenic mutations.   The Multi-Cancer Panel + RNA offered by [facility] includes sequencing and/or deletion duplication testing of the following 84 genes: AIP, ALK, APC, ATM, AXIN2,BAP1,  BARD1, BLM, BMPR1A, BRCA1, BRCA2, BRIP1, CASR, CDC73, CDH1, CDK4, CDKN1B, CDKN1C, CDKN2A (p14ARF), CDKN2A (p16INK4a), CEBPA, CHEK2, CTNNA1, DICER1, DIS3L2, EGFR (c.2369C>T, p.Thr790Met variant only), EPCAM (Deletion/duplication testing only), FH, FLCN, GATA2, GPC3, GREM1 (Promoter region deletion/duplication testing only), HOXB13 (c.251G>A, p.Gly84Glu), HRAS, KIT, MAX, MEN1, MET, MITF (c.952G>A, p.Glu318Lys variant only), MLH1, MSH2, MSH3, MSH6, MUTYH, NBN, NF1, NF2, NTHL1, PALB2, PDGFRA, PHOX2B, PMS2, POLD1, POLE, POT1, PRKAR1A, PTCH1, PTEN, RAD50, RAD51C, RAD51D, RB1, RECQL4, RET, RUNX1, SDHAF2, SDHA (sequence changes only), SDHB, SDHC, SDHD, SMAD4, SMARCA4, SMARCB1, SMARCE1, STK11, SUFU, TERC, TERT, TMEM127, TP53, TSC1, TSC2, VHL, WRN and WT1.   The Gonzalez report has been scanned into EPIC and is located under the Molecular Pathology section of the Results Review tab.  A portion of the result report is included below for reference.     We discussed that because current genetic testing is not perfect, it is possible there may be a gene mutation in one of these genes that current testing cannot detect, but that chance is small.  There could be another gene that has not yet been discovered, or that we have not yet tested, that is responsible for the cancer diagnoses in the family. It is also possible there is a hereditary cause for the cancer  in the family that Alvin Gonzalez did not inherit and therefore was not identified in his testing.  Therefore, it is important to remain in touch with cancer genetics in the future so that we can continue to offer Alvin Gonzalez the most up to date genetic testing.   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  At  this time, it is unknown if this variant is associated with increased cancer risk or if this is a normal finding, but most variants such as this get reclassified to being inconsequential. It should not be used to make medical management decisions. With time, we suspect the lab will determine the significance of this variant, if any. If we do learn more about it we will try to contact Alvin Gonzalez to discuss it further. However, it is important to stay in touch with [place] periodically and keep the address and phone number up to date.  ADDITIONAL GENETIC TESTING: We discussed with Alvin Gonzalez that his genetic testing was fairly extensive.  If there are genes identified to increase cancer risk that can be analyzed in the future, we would be happy to discuss and coordinate this testing at that time.    CANCER SCREENING RECOMMENDATIONS: Alvin Gonzalez result is considered negative (normal).  This means that we have not identified a hereditary cause for his polyps or family history of cancer at this time.  While reassuring, this does not definitively rule out a hereditary predisposition to cancer. It is still possible that there could be genetic mutations that are undetectable by current technology. There could be genetic mutations in genes that have not been tested or identified to increase cancer risk.  Therefore, it is recommended he continue to follow the cancer management and screening guidelines provided by his primary healthcare provider.   An individual's cancer risk and medical management are not determined by genetic Gonzalez results alone. Overall cancer risk assessment incorporates additional factors, including personal medical history, family history, and any available genetic information that may result in a personalized plan for cancer prevention and surveillance.  This negative genetic Gonzalez simply tells [place] that we cannot yet define why Alvin Gonzalez has had  an increased number of colorectal polyps.  Alvin Gonzalez  medical management and screening should be based on the prospect that he  will likely form more colon polyps and should, therefore, undergo more frequent colonoscopy screening at intervals determined by his GI providers.  We also recommended that Alvin Gonzalez have an upper endoscopy periodically.  RECOMMENDATIONS FOR FAMILY MEMBERS:  Relatives in this family might be at some increased risk of developing cancer, over the general population risk, simply due to the family history of cancer.  We recommended male relatives in this family have a yearly mammogram beginning at age 81, or 62 years younger than the earliest onset of cancer, an annual clinical breast exam, and perform monthly breast self-exams. Male relatives in this family should also have a gynecological exam as recommended by their primary provider.  All family members should be referred for colonoscopy starting at age 96.    It is also possible there is a hereditary cause for the cancer in Alvin Gonzalez family that he did not inherit and therefore was not identified in him.  Based on Alvin Gonzalez family history, we recommended his mother (if she had ovarian cancer) have genetic counseling and testing. Alvin Gonzalez will let us know if we can be of any  assistance in coordinating genetic counseling and/or testing for these family members.  FOLLOW-UP: Lastly, we discussed with Alvin Gonzalez. Boule that cancer genetics is a rapidly advancing field and it is possible that new genetic tests will be appropriate for him and/or his family members in the future. We encouraged him to remain in contact with cancer genetics on an annual basis so we can update his personal and family histories and let him know of advances in cancer genetics that may benefit this family.   Our contact number was provided. Alvin Gonzalez. Maahs questions were answered to his satisfaction, and he knows he is welcome to call us at anytime with additional questions or concerns.   Faith Rogue, MS, [facility] Genetic  Counselor [email or web address] Phone: [phone]

## 2021-07-22 ENCOUNTER — Other Ambulatory Visit: Payer: Self-pay

## 2021-07-23 ENCOUNTER — Telehealth: Payer: Self-pay

## 2021-07-23 NOTE — Telephone Encounter (Signed)
Patient called and left a voicemail to return his call. I then gave him a call and he didn't answer. However, I left him a voicemail to call us back.

## 2021-07-24 ENCOUNTER — Other Ambulatory Visit: Payer: Self-pay

## 2021-07-24 ENCOUNTER — Ambulatory Visit: Payer: Self-pay | Admitting: Gastroenterology

## 2021-07-24 ENCOUNTER — Encounter: Payer: Self-pay | Admitting: Gastroenterology

## 2021-07-24 VITALS — BP 131/82 | HR 85 | Temp 98.2°F | Wt 144.6 lb

## 2021-07-24 DIAGNOSIS — Z86010 Personal history of colonic polyps: Secondary | ICD-10-CM

## 2021-07-24 DIAGNOSIS — K648 Other hemorrhoids: Secondary | ICD-10-CM

## 2021-07-24 NOTE — Patient Instructions (Signed)
If you continue to have hemorrhoids please give [place] a call so we could send a referral for you to see a general surgeon.

## 2021-07-24 NOTE — Progress Notes (Signed)
Patient follow-ups today for banding of hemorrhoids    Summary of history : Summary of history : He has had issues with rectal bleeding with blood on the tissue paper ongoing on and off for more than a few months.    He has used medicated wipes which has not helped.  Denies any perianal itching.  .05/14/2021: Colonoscopy 10 polyps resected multiple adenomas, large hemorrhoids    First round:04/30/2021: LL column banded  Second round:06/02/2021: RA column banded  Third  round:RP column banded    Interval history  07/01/2021-07/24/2021  07/21/2021 seen by genetic counselor underwent genetic testing and no pathogenic mutations were found.  Plan to repeat colonoscopy in 1 years that is April 2024 Last few days she has had symptoms suggesting of a prolapsing hemorrhoid with no pain but discomfort Digital rectal exam performed in the presence of a chaperone. External anal findings: Prolapsing hemorrhoid at 12 o'clock position Internal findings: , No masses, no blood on glove noticed.    PROCEDURE NOTE: The patient presents with symptomatic grade 2 hemorrhoids, unresponsive to maximal medical therapy, requesting rubber band ligation of his/her hemorrhoidal disease.  All risks, benefits and alternative forms of therapy were described and informed consent was obtained.  In the Left Lateral Decubitus position (if anoscopy is performed) anoscopic examination revealed grade 2 hemorrhoids in the RA position(s).   The decision was made to band the RA internal hemorrhoid, and the [place] was used to perform band ligation without complication.  Digital anorectal examination was then performed to assure proper positioning of the band, and to adjust the banded tissue as required.  The patient was discharged home without pain or other issues.  Dietary and behavioral recommendations were given and (if necessary - prescriptions were given), along with follow-up instructions.  The patient will return for  follow-up and possible additional banding as required.  No complications were encountered and the patient tolerated the procedure well.   Plan:  Avoid constipation.  Commence on stool softeners if not already on If issues with hemorrhoids recur will refer to Dr. Hampton Abbot for hemorrhoidectomy  Follow-up: As needed  Dr Jonathon Bellows MD,MRCP [facility]) Gastroenterology/Hepatology Pager: [phone]

## 2021-07-26 ENCOUNTER — Inpatient Hospital Stay
Admission: EM | Admit: 2021-07-26 | Discharge: 2021-07-28 | DRG: 682 | Disposition: A | Discharge status: Home / Self Care | Payer: Medicaid Other | Attending: Internal Medicine | Admitting: Internal Medicine

## 2021-07-26 ENCOUNTER — Emergency Department: Payer: Medicaid Other

## 2021-07-26 ENCOUNTER — Inpatient Hospital Stay: Payer: Medicaid Other

## 2021-07-26 ENCOUNTER — Encounter: Payer: Self-pay | Admitting: Emergency Medicine

## 2021-07-26 ENCOUNTER — Other Ambulatory Visit: Payer: Self-pay

## 2021-07-26 DIAGNOSIS — R110 Nausea: Secondary | ICD-10-CM | POA: Diagnosis present

## 2021-07-26 DIAGNOSIS — Z66 Do not resuscitate: Secondary | ICD-10-CM | POA: Diagnosis present

## 2021-07-26 DIAGNOSIS — J9601 Acute respiratory failure with hypoxia: Secondary | ICD-10-CM

## 2021-07-26 DIAGNOSIS — Z886 Allergy status to analgesic agent status: Secondary | ICD-10-CM | POA: Diagnosis not present

## 2021-07-26 DIAGNOSIS — K219 Gastro-esophageal reflux disease without esophagitis: Secondary | ICD-10-CM | POA: Diagnosis present

## 2021-07-26 DIAGNOSIS — E2740 Unspecified adrenocortical insufficiency: Secondary | ICD-10-CM

## 2021-07-26 DIAGNOSIS — M549 Dorsalgia, unspecified: Secondary | ICD-10-CM | POA: Diagnosis present

## 2021-07-26 DIAGNOSIS — F418 Other specified anxiety disorders: Secondary | ICD-10-CM | POA: Diagnosis present

## 2021-07-26 DIAGNOSIS — S2231XA Fracture of one rib, right side, initial encounter for closed fracture: Secondary | ICD-10-CM | POA: Diagnosis present

## 2021-07-26 DIAGNOSIS — J90 Pleural effusion, not elsewhere classified: Secondary | ICD-10-CM | POA: Diagnosis present

## 2021-07-26 DIAGNOSIS — E876 Hypokalemia: Secondary | ICD-10-CM | POA: Diagnosis present

## 2021-07-26 DIAGNOSIS — J449 Chronic obstructive pulmonary disease, unspecified: Secondary | ICD-10-CM | POA: Diagnosis present

## 2021-07-26 DIAGNOSIS — F32A Depression, unspecified: Secondary | ICD-10-CM | POA: Diagnosis present

## 2021-07-26 DIAGNOSIS — Z20822 Contact with and (suspected) exposure to covid-19: Secondary | ICD-10-CM | POA: Diagnosis present

## 2021-07-26 DIAGNOSIS — R0902 Hypoxemia: Secondary | ICD-10-CM | POA: Diagnosis present

## 2021-07-26 DIAGNOSIS — Z888 Allergy status to other drugs, medicaments and biological substances status: Secondary | ICD-10-CM | POA: Diagnosis not present

## 2021-07-26 DIAGNOSIS — Z7982 Long term (current) use of aspirin: Secondary | ICD-10-CM | POA: Diagnosis not present

## 2021-07-26 DIAGNOSIS — R296 Repeated falls: Secondary | ICD-10-CM | POA: Diagnosis present

## 2021-07-26 DIAGNOSIS — F17210 Nicotine dependence, cigarettes, uncomplicated: Secondary | ICD-10-CM | POA: Diagnosis present

## 2021-07-26 DIAGNOSIS — Z5321 Procedure and treatment not carried out due to patient leaving prior to being seen by health care provider: Secondary | ICD-10-CM | POA: Diagnosis not present

## 2021-07-26 DIAGNOSIS — I10 Essential (primary) hypertension: Secondary | ICD-10-CM | POA: Diagnosis present

## 2021-07-26 DIAGNOSIS — G8929 Other chronic pain: Secondary | ICD-10-CM | POA: Diagnosis present

## 2021-07-26 DIAGNOSIS — N401 Enlarged prostate with lower urinary tract symptoms: Secondary | ICD-10-CM | POA: Diagnosis present

## 2021-07-26 DIAGNOSIS — I959 Hypotension, unspecified: Secondary | ICD-10-CM | POA: Diagnosis present

## 2021-07-26 DIAGNOSIS — W19XXXA Unspecified fall, initial encounter: Secondary | ICD-10-CM | POA: Diagnosis present

## 2021-07-26 DIAGNOSIS — N179 Acute kidney failure, unspecified: Secondary | ICD-10-CM | POA: Diagnosis present

## 2021-07-26 DIAGNOSIS — E860 Dehydration: Secondary | ICD-10-CM | POA: Diagnosis present

## 2021-07-26 DIAGNOSIS — J441 Chronic obstructive pulmonary disease with (acute) exacerbation: Secondary | ICD-10-CM | POA: Diagnosis not present

## 2021-07-26 DIAGNOSIS — Z79899 Other long term (current) drug therapy: Secondary | ICD-10-CM | POA: Diagnosis not present

## 2021-07-26 DIAGNOSIS — Z8249 Family history of ischemic heart disease and other diseases of the circulatory system: Secondary | ICD-10-CM

## 2021-07-26 DIAGNOSIS — R338 Other retention of urine: Secondary | ICD-10-CM | POA: Diagnosis present

## 2021-07-26 DIAGNOSIS — F4312 Post-traumatic stress disorder, chronic: Secondary | ICD-10-CM | POA: Diagnosis present

## 2021-07-26 DIAGNOSIS — M79671 Pain in right foot: Secondary | ICD-10-CM | POA: Diagnosis present

## 2021-07-26 DIAGNOSIS — K649 Unspecified hemorrhoids: Secondary | ICD-10-CM | POA: Diagnosis present

## 2021-07-26 DIAGNOSIS — Z833 Family history of diabetes mellitus: Secondary | ICD-10-CM

## 2021-07-26 DIAGNOSIS — Z800 Family history of malignant neoplasm of digestive organs: Secondary | ICD-10-CM

## 2021-07-26 DIAGNOSIS — F17200 Nicotine dependence, unspecified, uncomplicated: Secondary | ICD-10-CM | POA: Diagnosis present

## 2021-07-26 DIAGNOSIS — D72829 Elevated white blood cell count, unspecified: Secondary | ICD-10-CM | POA: Diagnosis present

## 2021-07-26 DIAGNOSIS — Z801 Family history of malignant neoplasm of trachea, bronchus and lung: Secondary | ICD-10-CM

## 2021-07-26 DIAGNOSIS — K5900 Constipation, unspecified: Secondary | ICD-10-CM | POA: Diagnosis not present

## 2021-07-26 DIAGNOSIS — M25551 Pain in right hip: Secondary | ICD-10-CM | POA: Diagnosis present

## 2021-07-26 LAB — COMPREHENSIVE METABOLIC PANEL
ALT: 13 U/L (ref 0–44)
AST: 19 U/L (ref 15–41)
Albumin: 4.3 g/dL (ref 3.5–5.0)
Alkaline Phosphatase: 74 U/L (ref 38–126)
Anion gap: 14 (ref 5–15)
BUN: 35 mg/dL — ABNORMAL HIGH (ref 6–20)
CO2: 26 mmol/L (ref 22–32)
Calcium: 9.7 mg/dL (ref 8.9–10.3)
Chloride: 99 mmol/L (ref 98–111)
Creatinine, Ser: 5.44 mg/dL — ABNORMAL HIGH (ref 0.61–1.24)
GFR, Estimated: 11 mL/min — ABNORMAL LOW (ref >60)
Glucose, Bld: 140 mg/dL — ABNORMAL HIGH (ref 70–99)
Potassium: 3.3 mmol/L — ABNORMAL LOW (ref 3.5–5.1)
Sodium: 139 mmol/L (ref 135–145)
Total Bilirubin: 0.8 mg/dL (ref 0.3–1.2)
Total Protein: 7.3 g/dL (ref 6.5–8.1)

## 2021-07-26 LAB — CBC WITH DIFFERENTIAL/PLATELET
Abs Immature Granulocytes: 0.03 10*3/uL (ref 0.00–0.07)
Basophils Absolute: 0.1 10*3/uL (ref 0.0–0.1)
Basophils Relative: 1 %
Eosinophils Absolute: 0.1 10*3/uL (ref 0.0–0.5)
Eosinophils Relative: 1 %
HCT: 43.4 % (ref 39.0–52.0)
Hemoglobin: 14.3 g/dL (ref 13.0–17.0)
Immature Granulocytes: 0 %
Lymphocytes Relative: 15 %
Lymphs Abs: 1.9 10*3/uL (ref 0.7–4.0)
MCH: 29.2 pg (ref 26.0–34.0)
MCHC: 32.9 g/dL (ref 30.0–36.0)
MCV: 88.6 fL (ref 80.0–100.0)
Monocytes Absolute: 1.1 10*3/uL — ABNORMAL HIGH (ref 0.1–1.0)
Monocytes Relative: 9 %
Neutro Abs: 9.3 10*3/uL — ABNORMAL HIGH (ref 1.7–7.7)
Neutrophils Relative %: 74 %
Platelets: 354 10*3/uL (ref 150–400)
RBC: 4.9 MIL/uL (ref 4.22–5.81)
RDW: 14.1 % (ref 11.5–15.5)
WBC: 12.5 10*3/uL — ABNORMAL HIGH (ref 4.0–10.5)
nRBC: 0 % (ref 0.0–0.2)

## 2021-07-26 LAB — MAGNESIUM: Magnesium: 2.4 mg/dL (ref 1.7–2.4)

## 2021-07-26 LAB — URINALYSIS, ROUTINE W REFLEX MICROSCOPIC
Bilirubin Urine: NEGATIVE
Glucose, UA: 50 mg/dL — AB
Hgb urine dipstick: NEGATIVE
Ketones, ur: NEGATIVE mg/dL
Leukocytes,Ua: NEGATIVE
Nitrite: NEGATIVE
Protein, ur: 30 mg/dL — AB
Specific Gravity, Urine: 1.012 (ref 1.005–1.030)
pH: 5 (ref 5.0–8.0)

## 2021-07-26 LAB — SARS CORONAVIRUS 2 BY RT PCR: SARS Coronavirus 2 by RT PCR: NEGATIVE

## 2021-07-26 LAB — CORTISOL: Cortisol, Plasma: 16.8 ug/dL

## 2021-07-26 LAB — D-DIMER, QUANTITATIVE: D-Dimer, Quant: 0.9 ug/mL-FEU — ABNORMAL HIGH (ref 0.00–0.50)

## 2021-07-26 LAB — TROPONIN I (HIGH SENSITIVITY): Troponin I (High Sensitivity): 10 ng/L (ref <18)

## 2021-07-26 LAB — BRAIN NATRIURETIC PEPTIDE: B Natriuretic Peptide: 11.8 pg/mL (ref 0.0–100.0)

## 2021-07-26 MED ORDER — ONDANSETRON HCL 4 MG/2ML IJ SOLN: 4.0000 mg | Freq: Three times a day (TID) | INTRAMUSCULAR | Status: DC | PRN

## 2021-07-26 MED ORDER — ACETAMINOPHEN 325 MG PO TABS: 650.0000 mg | ORAL_TABLET | Freq: Four times a day (QID) | ORAL | Status: DC | PRN

## 2021-07-26 MED ORDER — SODIUM CHLORIDE 0.9 % IV BOLUS: 500.0000 mL | Freq: Once | INTRAVENOUS | Status: DC

## 2021-07-26 MED ORDER — NICOTINE 21 MG/24HR TD PT24
21.0000 mg | MEDICATED_PATCH | Freq: Every day | TRANSDERMAL | Status: DC
  Administered 2021-07-26 – 2021-07-28 (×3): 21 mg via TRANSDERMAL
  Filled 2021-07-26 (×3): qty 1

## 2021-07-26 MED ORDER — PANTOPRAZOLE SODIUM 40 MG PO TBEC
40.0000 mg | DELAYED_RELEASE_TABLET | Freq: Every day | ORAL | Status: DC
  Administered 2021-07-27 – 2021-07-28 (×2): 40 mg via ORAL
  Filled 2021-07-26 (×2): qty 1

## 2021-07-26 MED ORDER — SODIUM CHLORIDE 0.9 % IV SOLN: INTRAVENOUS | Status: DC

## 2021-07-26 MED ORDER — ALBUTEROL SULFATE (2.5 MG/3ML) 0.083% IN NEBU: 3.0000 mL | INHALATION_SOLUTION | RESPIRATORY_TRACT | Status: DC | PRN

## 2021-07-26 MED ORDER — MIDODRINE HCL 5 MG PO TABS
10.0000 mg | ORAL_TABLET | Freq: Three times a day (TID) | ORAL | Status: DC
  Administered 2021-07-26 – 2021-07-28 (×6): 10 mg via ORAL
  Filled 2021-07-26 (×5): qty 2

## 2021-07-26 MED ORDER — OXYCODONE-ACETAMINOPHEN 5-325 MG PO TABS
1.0000 | ORAL_TABLET | ORAL | Status: DC | PRN
  Administered 2021-07-26 – 2021-07-28 (×10): 1 via ORAL
  Filled 2021-07-26 (×10): qty 1

## 2021-07-26 MED ORDER — HEPARIN SODIUM (PORCINE) 5000 UNIT/ML IJ SOLN
5000.0000 [IU] | Freq: Three times a day (TID) | INTRAMUSCULAR | Status: DC
  Administered 2021-07-26 – 2021-07-28 (×6): 5000 [IU] via SUBCUTANEOUS
  Filled 2021-07-26 (×6): qty 1

## 2021-07-26 MED ORDER — ASPIRIN 81 MG PO CHEW
81.0000 mg | CHEWABLE_TABLET | Freq: Every day | ORAL | Status: DC
  Administered 2021-07-26 – 2021-07-28 (×3): 81 mg via ORAL
  Filled 2021-07-26 (×2): qty 1

## 2021-07-26 MED ORDER — SODIUM CHLORIDE 0.9 % IV BOLUS
500.0000 mL | Freq: Once | INTRAVENOUS | Status: AC
  Administered 2021-07-26: 500 mL via INTRAVENOUS

## 2021-07-26 MED ORDER — SODIUM CHLORIDE 0.9 % IV BOLUS
2000.0000 mL | Freq: Once | INTRAVENOUS | Status: AC
  Administered 2021-07-26: 2000 mL via INTRAVENOUS

## 2021-07-26 MED ORDER — AMITRIPTYLINE HCL 25 MG PO TABS
25.0000 mg | ORAL_TABLET | Freq: Every day | ORAL | Status: DC
  Administered 2021-07-26 – 2021-07-27 (×2): 25 mg via ORAL
  Filled 2021-07-26 (×2): qty 1

## 2021-07-26 MED ORDER — DM-GUAIFENESIN ER 30-600 MG PO TB12: 1.0000 | ORAL_TABLET | Freq: Two times a day (BID) | ORAL | Status: DC | PRN

## 2021-07-26 MED ORDER — POTASSIUM CHLORIDE CRYS ER 20 MEQ PO TBCR
40.0000 meq | EXTENDED_RELEASE_TABLET | Freq: Once | ORAL | Status: AC
  Administered 2021-07-26: 40 meq via ORAL
  Filled 2021-07-26: qty 2

## 2021-07-26 NOTE — Progress Notes (Signed)
Pt unable to void since 0800. 650 cc on bladder scan. Voided 250cc prior to I&O cath, 500cc removed from bladder during catheterization.

## 2021-07-26 NOTE — Assessment & Plan Note (Signed)
Images are negative for acute injury except for right seventh rib fracture.  Possibly due to dehydration and hypotension -PT/OT -Fall precaution.

## 2021-07-26 NOTE — Assessment & Plan Note (Signed)
-  Nicotine patch 

## 2021-07-26 NOTE — Assessment & Plan Note (Signed)
WBC 12.5, no fever, no source of infection identified.  -Blood culture -Follow-up urinalysis -Follow-up with CBC

## 2021-07-26 NOTE — ED Triage Notes (Signed)
Pt via POV from home. Pt c/o increased weakness, states that since yesterday he has been weak all over. Multiple falls, states " I feel 20 times yesterday" States he did hit his head but denies any LOC, pt does not take blood thinners. Denies any numbness or tingling but states that he is having R foot, R hip, back, and neck pain. Pt is A&Ox4 and NAD

## 2021-07-26 NOTE — Assessment & Plan Note (Signed)
Denies suicidal homicidal ideations. - Amitriptyline

## 2021-07-26 NOTE — ED Notes (Signed)
Informed RN bed assigned 

## 2021-07-26 NOTE — Assessment & Plan Note (Signed)
-   Incentive spirometry -As needed Percocet and Tylenol for pain

## 2021-07-26 NOTE — Assessment & Plan Note (Signed)
Etiology is not clear.  Possibly due to dehydration given recent diarrhea and continuation of blood pressure medications -Will hold blood pressure medications, including amlodipine, HCTZ and lisinopril -IV fluid: As above -Check cortisol level -Start midodrine 10 mg 3 times daily -Patient is DNR, but he states that he is okay to use vasopressor if needed

## 2021-07-26 NOTE — Assessment & Plan Note (Signed)
Recent baseline creatinine 0.9 on 01/23/2021.  His creatinine is 5.44, BUN 25, GFR 11.  Renal ultrasound is negative.  Etiology is not clear, likely dehydration and continuation of diuretics and ACEI.  ATN is also possible due to hypotension.  -Admitted to telemetry bed as inpatient -IV fluid: 2.5 L normal saline, then 100 cc/h -Follow-up urinalysis -Consulted Dr. Candiss Norse for nephrology -Hold HCTZ and lisinopril -Follow-up with BMP -Avoid using renal toxic medications

## 2021-07-26 NOTE — Assessment & Plan Note (Signed)
Patient is not using oxygen normally.  He was found to have oxygen desaturation to 87% on room air.  Currently on 4 L oxygen with 98% of saturation.  Etiology is not clear.  No history of CHF.  BNP 11.8.  Lungs clear to auscultation, does not seem to have COPD exacerbation.  Chest x-ray negative for infiltration.  D-dimer positive at 0.90.  Need to rule out PE  -As needed albuterol -Nasal cannula oxygen to maintain oxygen saturation above 93% -Follow-up VQ scan to rule out PE -Lower extremity venous Doppler to rule out a DVT due to positive D-dimer

## 2021-07-26 NOTE — Assessment & Plan Note (Signed)
Potassium 3.3 -Repleted potassium -Check a magnesium level --> 2.4

## 2021-07-26 NOTE — ED Provider Notes (Signed)
[facility] Provider Note   Event Date/Time   First MD Initiated Contact with Patient 07/26/21 1159     (approximate) History  Weakness and Fall  HPI Alvin Hetz. is a 61 y.o. male  Location: Generalized Duration: 48 hours prior to arrival Timing: Stable since onset Severity: Severe Quality: Weakness Context: Patient states that over the last day he has been having multiple falls due to generalized weakness Modifying factors: Denies Associated Symptoms: Decreased urination ROS: Patient currently denies any vision changes, tinnitus, difficulty speaking, facial droop, sore throat, chest pain, shortness of breath, abdominal pain, nausea/vomiting/diarrhea, dysuria, or numbness/paresthesias in any extremity   Physical Exam  Triage Vital Signs: ED Triage Vitals [07/26/21 0947]  Enc Vitals Group     BP (!) 74/47     Pulse Rate 78     Resp 18     Temp 97.6 F (36.4 C)     Temp Source Oral     SpO2 (!) 87 %     Weight 150 lb (68 kg)     Height '5\' 5"'$  (1.651 m)     Head Circumference      Peak Flow      Pain Score 10     Pain Loc      Pain Edu?      Excl. in [place]?    Most recent vital signs: Vitals:   07/26/21 1142 07/26/21 1300  BP: (!) 80/62 94/73  Pulse: 77 79  Resp: 18 (!) 21  Temp: 98.2 F (36.8 C)   SpO2: 98% 97%   General: Awake, oriented x4. CV:  Good peripheral perfusion.  Resp:  Normal effort.  Abd:  No distention.  Other:  Middle-aged Caucasian male laying in bed in no acute distress ED Results / Procedures / Treatments  Labs (all labs ordered are listed, but only abnormal results are displayed) Labs Reviewed  COMPREHENSIVE METABOLIC PANEL - Abnormal; Notable for the following components:      Result Value   Potassium 3.3 (*)    Glucose, Bld 140 (*)    BUN 35 (*)    Creatinine, Ser 5.44 (*)    GFR, Estimated 11 (*)    All other components within normal limits  CBC WITH DIFFERENTIAL/PLATELET - Abnormal; Notable for the  following components:   WBC 12.5 (*)    Neutro Abs 9.3 (*)    Monocytes Absolute 1.1 (*)    All other components within normal limits  CULTURE, BLOOD (ROUTINE X 2)  CULTURE, BLOOD (ROUTINE X 2)  SARS CORONAVIRUS 2 BY RT PCR  BRAIN NATRIURETIC PEPTIDE  MAGNESIUM  URINALYSIS, ROUTINE W REFLEX MICROSCOPIC  CORTISOL  HIV ANTIBODY (ROUTINE TESTING W REFLEX)  D-DIMER, QUANTITATIVE  TROPONIN I (HIGH SENSITIVITY)   EKG ED ECG REPORT I, Naaman Plummer, the attending physician, personally viewed and interpreted this ECG. Date: 07/26/2021 EKG Time: 0944 Rate: 81 Rhythm: normal sinus rhythm QRS Axis: normal Intervals: normal ST/T Wave abnormalities: normal Narrative Interpretation: no evidence of acute ischemia RADIOLOGY ED MD interpretation: Single view portable chest x-ray interpreted by me and shows blunting of the right costophrenic angle possibly due to small right pleural effusion or pleural thickening.  There is also age-indeterminate fracture of the lateral seventh rib  CT of the head without contrast interpreted by me shows no evidence of acute abnormalities including no intracerebral hemorrhage, obvious masses, or significant edema CT of the cervical spine interpreted by me does not show any evidence of  acute abnormalities including no acute fracture, malalignment, height loss, or dislocation x-ray of the right foot shows a small plantar calcaneal spur without any evidence of acute fracture, subluxation, or dislocation.  Right hip x-ray interpreted by me and shows no evidence of acute abnormalities including no fractures or dislocations -Agree with radiology assessment Official radiology report(s): US Renal  Result Date: 07/26/2021 CLINICAL DATA:  61 year old male with acute renal failure EXAM: RENAL / URINARY TRACT ULTRASOUND COMPLETE COMPARISON:  01/30/2015 CT and prior studies. FINDINGS: Right Kidney: Renal measurements: 11.5 x 4.5 x 5.5 cm = volume: 149 mL. Echogenicity within  normal limits. No suspicious mass or hydronephrosis visualized. Left Kidney: Renal measurements: 10.8 x 5.6 x 4.9 cm = volume: 160 mL. Echogenicity within normal limits. No suspicious mass or hydronephrosis visualized. Bladder: Appears normal for degree of bladder distention. Other: None. IMPRESSION: Unremarkable renal ultrasound. Electronically Signed   By: Margarette Canada M.D.   On: 07/26/2021 12:49   DG Chest Port 1 View  Result Date: 07/26/2021 CLINICAL DATA:  Weakness EXAM: PORTABLE CHEST 1 VIEW COMPARISON:  Chest x-ray dated September 28, 2014 FINDINGS: Cardiac and mediastinal contours within normal limits for AP technique. Blunting of the right costophrenic angle. Mild bibasilar opacities, likely due to atelectasis. No large pleural effusion or pneumothorax. Age indeterminate fracture of the lateral right 7th rib, new when compared with 2016 prior exam. IMPRESSION: 1. Blunting of the right costophrenic angle, possibly due to small right pleural effusion or pleural thickening. 2. Age-indeterminate fracture of the lateral right 7th rib, correlate for point tenderness. Electronically Signed   By: Yetta Glassman M.D.   On: 07/26/2021 11:22   CT Head Wo Contrast  Result Date: 07/26/2021 CLINICAL DATA:  Multiple falls recently. Head trauma, altered mental status. EXAM: CT HEAD WITHOUT CONTRAST CT CERVICAL SPINE WITHOUT CONTRAST TECHNIQUE: Multidetector CT imaging of the head and cervical spine was performed following the standard protocol without intravenous contrast. Multiplanar CT image reconstructions of the cervical spine were also generated. RADIATION DOSE REDUCTION: This exam was performed according to the departmental dose-optimization program which includes automated exposure control, adjustment of the mA and/or kV according to patient size and/or use of iterative reconstruction technique. COMPARISON:  Head and cervical spine CT 01/30/2015 FINDINGS: CT HEAD FINDINGS Brain: Ventricles, cisterns and other  CSF spaces are normal. No mass, mass effect, shift of midline structures or acute hemorrhage. No evidence of acute infarction. Vascular: No hyperdense vessel or unexpected calcification. Skull: Normal. Negative for fracture or focal lesion. Sinuses/Orbits: Hypoplastic frontal sinuses. Remaining paranasal sinuses are clear. Orbits are normal. Other: None. CT CERVICAL SPINE FINDINGS Alignment: Normal. Skull base and vertebrae: Vertebral body heights are maintained. There is moderate spondylosis of the cervical spine to include uncovertebral joint spurring and facet arthropathy. Atlantoaxial articulation is unremarkable. No acute fracture. Significant right-sided neural foraminal narrowing at the C3-4 level with bilateral neural foraminal narrowing at the C4-5, C5-6 and C6-7 levels. Soft tissues and spinal canal: Prevertebral soft tissues are normal. Minimal canal stenosis at the C4-5 level. Disc levels:  Disc space narrowing from the C4-5 to the C6-7 levels. Upper chest: No acute findings. Other: None. IMPRESSION: 1. No acute brain injury. 2. No acute cervical spine injury. 3. 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 Minimal canal stenosis at the C4-5 level. Electronically Signed   By: Marin Olp M.D.   On: 07/26/2021 10:29   CT Cervical Spine Wo Contrast  Result Date: 07/26/2021 CLINICAL DATA:  Multiple falls recently. Head trauma, altered mental status. EXAM: CT HEAD WITHOUT CONTRAST CT CERVICAL SPINE WITHOUT CONTRAST TECHNIQUE: Multidetector CT imaging of the head and cervical spine was performed following the standard protocol without intravenous contrast. Multiplanar CT image reconstructions of the cervical spine were also generated. RADIATION DOSE REDUCTION: This exam was performed according to the departmental dose-optimization program which includes automated exposure control, adjustment of the mA and/or kV according  to patient size and/or use of iterative reconstruction technique. COMPARISON:  Head and cervical spine CT 01/30/2015 FINDINGS: CT HEAD FINDINGS Brain: Ventricles, cisterns and other CSF spaces are normal. No mass, mass effect, shift of midline structures or acute hemorrhage. No evidence of acute infarction. Vascular: No hyperdense vessel or unexpected calcification. Skull: Normal. Negative for fracture or focal lesion. Sinuses/Orbits: Hypoplastic frontal sinuses. Remaining paranasal sinuses are clear. Orbits are normal. Other: None. CT CERVICAL SPINE FINDINGS Alignment: Normal. Skull base and vertebrae: Vertebral body heights are maintained. There is moderate spondylosis of the cervical spine to include uncovertebral joint spurring and facet arthropathy. Atlantoaxial articulation is unremarkable. No acute fracture. Significant right-sided neural foraminal narrowing at the C3-4 level with bilateral neural foraminal narrowing at the C4-5, C5-6 and C6-7 levels. Soft tissues and spinal canal: Prevertebral soft tissues are normal. Minimal canal stenosis at the C4-5 level. Disc levels:  Disc space narrowing from the C4-5 to the C6-7 levels. Upper chest: No acute findings. Other: None. IMPRESSION: 1. No acute brain injury. 2. No acute cervical spine injury. 3. 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 Minimal canal stenosis at the C4-5 level. Electronically Signed   By: Marin Olp M.D.   On: 07/26/2021 10:29   DG Foot Complete Right  Result Date: 07/26/2021 CLINICAL DATA:  61 year old male with acute foot pain. EXAM: RIGHT FOOT COMPLETE - 3 VIEW COMPARISON:  None Available. FINDINGS: There is no evidence of acute fracture, subluxation or dislocation. The Lisfranc joints are unremarkable. A small plantar calcaneal spur is present. IMPRESSION: Small plantar calcaneal spur. Electronically Signed   By: Margarette Canada M.D.   On: 07/26/2021  10:19   DG Hip Unilat With Pelvis 2-3 Views Right  Result Date: 07/26/2021 CLINICAL DATA:  Fall with right hip pain. EXAM: DG HIP (WITH OR WITHOUT PELVIS) 2-3V RIGHT COMPARISON:  None Available. FINDINGS: Examination demonstrates minimal symmetric degenerative change of the hips. No evidence of acute fracture or dislocation. IMPRESSION: No acute findings. Electronically Signed   By: Marin Olp M.D.   On: 07/26/2021 10:17   PROCEDURES: Critical Care performed: Yes, see critical care procedure note(s) .1-3 Lead EKG Interpretation  Performed by: Naaman Plummer, MD Authorized by: Naaman Plummer, MD     Interpretation: normal     ECG rate:  78   ECG rate assessment: normal     Rhythm: sinus rhythm     Ectopy: none     Conduction: normal   CRITICAL CARE Performed by: Naaman Plummer  Total critical care time: 33 minutes  Critical care time was exclusive of separately billable procedures and treating other patients.  Critical care was necessary to treat or prevent imminent or life-threatening deterioration.  Critical care was time spent personally by me on the following activities: development of treatment plan with patient and/or surrogate as well as nursing, discussions with consultants, evaluation of patient's response to treatment, examination of patient, obtaining history from patient or surrogate, ordering  and performing treatments and interventions, ordering and review of laboratory studies, ordering and review of radiographic studies, pulse oximetry and re-evaluation of patient's condition.  MEDICATIONS ORDERED IN ED: Medications  0.9 %  sodium chloride infusion ( Intravenous New Bag/Given 07/26/21 1234)  nicotine (NICODERM CQ - dosed in mg/24 hours) patch 21 mg (21 mg Transdermal Patch Applied 07/26/21 1314)  ondansetron (ZOFRAN) injection 4 mg (has no administration in time range)  acetaminophen (TYLENOL) tablet 650 mg (has no administration in time range)  heparin injection  5,000 Units (5,000 Units Subcutaneous Given 07/26/21 1315)  oxyCODONE-acetaminophen (PERCOCET/ROXICET) 5-325 MG per tablet 1 tablet (has no administration in time range)  midodrine (PROAMATINE) tablet 10 mg (10 mg Oral Given 07/26/21 1314)  albuterol (PROVENTIL) (2.5 MG/3ML) 0.083% nebulizer solution 3 mL (has no administration in time range)  dextromethorphan-guaiFENesin (MUCINEX DM) 30-600 MG per 12 hr tablet 1 tablet (has no administration in time range)  sodium chloride 0.9 % bolus 2,000 mL (0 mLs Intravenous Stopped 07/26/21 1235)  potassium chloride SA (KLOR-CON M) CR tablet 40 mEq (40 mEq Oral Given 07/26/21 1314)   IMPRESSION / MDM / ASSESSMENT AND PLAN / ED COURSE  I reviewed the triage vital signs and the nursing notes.                             The patient is on the cardiac monitor to evaluate for evidence of arrhythmia and/or significant heart rate changes. Patient's presentation is most consistent with acute presentation with potential threat to life or bodily function. This patient presents with generalized weakness and fatigue likely secondary to dehydration. Suspect acute kidney injury of prerenal origin. Doubt intrinsic renal dysfunction or obstructive nephropathy. Considered alternate etiologies of the patients symptoms including infectious processes, severe metabolic derangements or electrolyte abnormalities, ischemia/ACS, heart failure, and intracranial/central processes but think these are unlikely given the history and physical exam.  Plan: labs, 2L fluid resuscitation, pain/nausea control, reassessment  Dispo: Admit to medicine   FINAL CLINICAL IMPRESSION(S) / ED DIAGNOSES   Final diagnoses:  AKI (acute kidney injury) ([place])  Dehydration  Fall, initial encounter   Rx / DC Orders   ED Discharge Orders     None      Note:  This document was prepared using Dragon voice recognition software and may include unintentional dictation errors.   Naaman Plummer,  MD 07/26/21 1324

## 2021-07-26 NOTE — H&P (Signed)
History and Physical    Alvin Gonzalez. BJS:[id number] DOB: 1960/10/15 DOA: 07/26/2021  Referring MD/NP/PA:   PCP: Langston Reusing, NP   Patient coming from:  The patient is coming from home.  At baseline, pt is independent for most of ADL.        Chief Complaint: Generalized weakness, multiple falls, decreased urine output  HPI: Alvin Gonzalez. is a 61 y.o. male with medical history significant of hypertension, GERD, depression, anxiety, chronic pain, tobacco abuse, history of suicidal ideation, who presents with generalized weakness, multiple falls, decreased urine output.  Patient states that he has been feeling weak for more than a month, which has been progressively worsening.  No unilateral numbness or tinglings, no facial droop or slurred speech.  Patient states that he has had multiple falls since yesterday. No loss of consciousness.  He has mild pain in right foot, right hip and the neck.  No headache. Patient denies chest pain, cough, shortness breath.  He is not using oxygen normally.  He was found to have oxygen desaturation to 87% on room air, which increased to 98% on 4 L oxygen.  He states he had diarrhea last week, which has resolved currently.  Currently no nausea vomiting, diarrhea or abdominal pain.  Denies dysuria or burning with urination, but he states that he has decreased urine output in the past several days.  Patient was found to have hypotension with blood pressure 74/47, which improved to SBP of upper 80s-lower 90s with ongoing IVF resuscitation in the emergency room.  Data Reviewed and ED Course: pt was found to have WBC 12.5, troponin level 10, BNP 11.8, AKI with creatinine 5.44, BUN 25, GFR 11 (baseline creatinine 0.9 on 01/23/2021), potassium 3.3.  Temperature 98.2, heart rate 78, RR 18.  Patient is admitted to telemetry bed as inpatient.  Dr. Candiss Norse of nephrology is consulted   EKG: I have personally reviewed.  Sinus rhythm, QTc 448, nonspecific T  wave change.  Review of Systems:   General: no fevers, chills, no body weight gain, has poor appetite, has fatigue HEENT: no blurry vision, hearing changes or sore throat Respiratory: no dyspnea, coughing, wheezing CV: no chest pain, no palpitations GI: no nausea, vomiting, abdominal pain, diarrhea, constipation GU: no dysuria, burning on urination, increased urinary frequency, hematuria  Ext: no leg edema Neuro: no unilateral weakness, numbness, or tingling, no vision change or hearing loss. Has fall. Skin: no rash, no skin tear. MSK: No muscle spasm, no deformity, no limitation of range of movement in spin Heme: No easy bruising.  Travel history: No recent long distant travel.   Allergy:  Allergies  Allergen Reactions   Carbamazepine Rash and Hives   Ibuprofen Nausea And Vomiting and Rash    GI Bleeding    Risperidone Swelling    Tongue swelling Tongue swelling Tongue swelling Tongue swelling? Pt self report   Naproxen Other (See Comments)    stomach bleeding stomach bleeding stomach bleeding    Past Medical History:  Diagnosis Date   Anxiety    Chronic back pain    Depression    Depression    Hypertension    Intermittent explosive disorder    Suicide attempt ([place])    prior to 02/18/14 admission    Tobacco abuse     Past Surgical History:  Procedure Laterality Date   "broken back"     CHEST TUBE INSERTION     COLONOSCOPY WITH PROPOFOL N/A 05/14/2021   Procedure: COLONOSCOPY  WITH PROPOFOL;  Surgeon: Jonathon Bellows, MD;  Location: [facility] ENDOSCOPY;  Service: Gastroenterology;  Laterality: N/A;   LYMPH NODE BIOPSY      Social History:  reports that he has been smoking cigarettes. He has a 45.00 pack-year smoking history. He quit smokeless tobacco use about 6 years ago. He reports that he does not currently use alcohol. He reports current drug use. Drug: Marijuana.  Family History:  Family History  Problem Relation Age of Onset   Diabetes Mother    Hypertension  Mother    Other Mother        possible ovarian cancer   Heart disease Father    Hypertension Father    Cirrhosis Father    Liver cancer Father    Lung cancer Sister    Heart attack Maternal Grandmother    Suicidality Maternal Grandfather    Other Paternal Grandmother        unknown medical history   Suicidality Paternal Grandfather    Stomach cancer Cousin      Prior to Admission medications   Medication Sig Start Date End Date Taking? Authorizing Provider  acetaminophen (TYLENOL) 500 MG tablet Take 1,000 mg by mouth every 6 (six) hours as needed.   Yes [provider]  amitriptyline (ELAVIL) 25 MG tablet Take 1 tablet (25 mg total) by mouth at bedtime. 07/16/21  Yes Iloabachie, Chioma E, NP  amLODipine (NORVASC) 10 MG tablet Take 1 tablet (10 mg total) by mouth once daily. 05/22/21  Yes Iloabachie, Chioma E, NP  aspirin 81 MG chewable tablet Chew 81 mg by mouth daily.   Yes [provider]  gabapentin (NEURONTIN) 400 MG capsule Take 1 capsule (400 mg total) by mouth 3 (three) times daily. 06/19/21  Yes Iloabachie, Chioma E, NP  hydrochlorothiazide (HYDRODIURIL) 25 MG tablet Take 1 tablet (25 mg total) by mouth once daily. 07/16/21  Yes Iloabachie, Chioma E, NP  lisinopril (ZESTRIL) 30 MG tablet TAKE 1 TABLET BY MOUTH ONCE DAILY. 05/22/21  Yes Iloabachie, Chioma E, NP  pantoprazole (PROTONIX) 40 MG tablet Take 1 tablet (40 mg total) by mouth once daily. 05/27/21  Yes Iloabachie, Chioma E, NP  UNABLE TO FIND "Off brand arthritis medicine"    [provider]    Physical Exam: Vitals:   07/26/21 0947 07/26/21 1000 07/26/21 1142 07/26/21 1300  BP: (!) 74/47  (!) 80/62 94/73  Pulse: 78 76 77 79  Resp: '18 16 18 '$ (!) 21  Temp: 97.6 F (36.4 C)  98.2 F (36.8 C)   TempSrc: Oral  Oral   SpO2: (!) 87% 94% 98% 97%  Weight: 68 kg     Height: '5\' 5"'$  (1.651 m)      General: Not in acute distress.  Dry mucous membrane HEENT:       Eyes: PERRL, EOMI, no scleral  icterus.       ENT: No discharge from the ears and nose, no pharynx injection, no tonsillar enlargement.        Neck: No JVD, no bruit, no mass felt. Heme: No neck lymph node enlargement. Cardiac: S1/S2, RRR, No murmurs, No gallops or rubs. Respiratory: No rales, wheezing, rhonchi or rubs. GI: Soft, nondistended, nontender, no rebound pain, no organomegaly, BS present. GU: No hematuria Ext: No pitting leg edema bilaterally. 1+DP/PT pulse bilaterally. Musculoskeletal: No joint deformities, No joint redness or warmth, no limitation of ROM in spin. Skin: No rashes.  Neuro: Alert, oriented X3, cranial nerves II-XII grossly intact, moves all extremities normally.  Psych: Patient is not psychotic, no suicidal or hemocidal ideation.  Labs on Admission: I have personally reviewed following labs and imaging studies  CBC: Recent Labs  Lab 07/26/21 0958  WBC 12.5*  NEUTROABS 9.3*  HGB 14.3  HCT 43.4  MCV 88.6  PLT 789   Basic Metabolic Panel: Recent Labs  Lab 07/26/21 0958  NA 139  K 3.3*  CL 99  CO2 26  GLUCOSE 140*  BUN 35*  CREATININE 5.44*  CALCIUM 9.7  MG 2.4   GFR: Estimated Creatinine Clearance: 12.7 mL/min (A) (by C-G formula based on SCr of 5.44 mg/dL (H)). Liver Function Tests: Recent Labs  Lab 07/26/21 0958  AST 19  ALT 13  ALKPHOS 74  BILITOT 0.8  PROT 7.3  ALBUMIN 4.3   No results for input(s): "LIPASE", "AMYLASE" in the last 168 hours. No results for input(s): "AMMONIA" in the last 168 hours. Coagulation Profile: No results for input(s): "INR", "PROTIME" in the last 168 hours. Cardiac Enzymes: No results for input(s): "CKTOTAL", "CKMB", "CKMBINDEX", "TROPONINI" in the last 168 hours. BNP (last 3 results) No results for input(s): "PROBNP" in the last 8760 hours. HbA1C: No results for input(s): "HGBA1C" in the last 72 hours. CBG: No results for input(s): "GLUCAP" in the last 168 hours. Lipid Profile: No results for input(s): "CHOL", "HDL", "LDLCALC",  "TRIG", "CHOLHDL", "LDLDIRECT" in the last 72 hours. Thyroid Function Tests: No results for input(s): "TSH", "T4TOTAL", "FREET4", "T3FREE", "THYROIDAB" in the last 72 hours. Anemia Panel: No results for input(s): "VITAMINB12", "FOLATE", "FERRITIN", "TIBC", "IRON", "RETICCTPCT" in the last 72 hours. Urine analysis:    Component Value Date/Time   COLORURINE AMBER (A) 09/13/2015 1307   APPEARANCEUR CLOUDY (A) 09/13/2015 1307   APPEARANCEUR Clear 02/18/2014 0914   LABSPEC 1.021 09/13/2015 1307   LABSPEC 1.004 02/18/2014 0914   PHURINE 6.0 09/13/2015 1307   GLUCOSEU NEGATIVE 09/13/2015 1307   GLUCOSEU Negative 02/18/2014 0914   HGBUR NEGATIVE 09/13/2015 1307   BILIRUBINUR neg 06/19/2021 1112   BILIRUBINUR Negative 02/18/2014 0914   KETONESUR NEGATIVE 09/13/2015 1307   PROTEINUR Negative 06/19/2021 1112   PROTEINUR NEGATIVE 09/13/2015 1307   UROBILINOGEN 0.2 06/19/2021 1112   UROBILINOGEN 0.2 02/18/2014 1223   NITRITE neg 06/19/2021 1112   NITRITE NEGATIVE 09/13/2015 1307   LEUKOCYTESUR Negative 06/19/2021 1112   LEUKOCYTESUR Negative 02/18/2014 0914   Sepsis Labs: '@LABRCNTIP'$ (procalcitonin:4,lacticidven:4) )No results found for this or any previous visit (from the past 240 hour(s)).   Radiological Exams on Admission: US Renal  Result Date: 07/26/2021 CLINICAL DATA:  61 year old male with acute renal failure EXAM: RENAL / URINARY TRACT ULTRASOUND COMPLETE COMPARISON:  01/30/2015 CT and prior studies. FINDINGS: Right Kidney: Renal measurements: 11.5 x 4.5 x 5.5 cm = volume: 149 mL. Echogenicity within normal limits. No suspicious mass or hydronephrosis visualized. Left Kidney: Renal measurements: 10.8 x 5.6 x 4.9 cm = volume: 160 mL. Echogenicity within normal limits. No suspicious mass or hydronephrosis visualized. Bladder: Appears normal for degree of bladder distention. Other: None. IMPRESSION: Unremarkable renal ultrasound. Electronically Signed   By: Margarette Canada M.D.   On: 07/26/2021  12:49   DG Chest Port 1 View  Result Date: 07/26/2021 CLINICAL DATA:  Weakness EXAM: PORTABLE CHEST 1 VIEW COMPARISON:  Chest x-ray dated September 28, 2014 FINDINGS: Cardiac and mediastinal contours within normal limits for AP technique. Blunting of the right costophrenic angle. Mild bibasilar opacities, likely due to atelectasis. No large pleural effusion or pneumothorax. Age indeterminate fracture of the lateral right 7th  rib, new when compared with 2016 prior exam. IMPRESSION: 1. Blunting of the right costophrenic angle, possibly due to small right pleural effusion or pleural thickening. 2. Age-indeterminate fracture of the lateral right 7th rib, correlate for point tenderness. Electronically Signed   By: Yetta Glassman M.D.   On: 07/26/2021 11:22   CT Head Wo Contrast  Result Date: 07/26/2021 CLINICAL DATA:  Multiple falls recently. Head trauma, altered mental status. EXAM: CT HEAD WITHOUT CONTRAST CT CERVICAL SPINE WITHOUT CONTRAST TECHNIQUE: Multidetector CT imaging of the head and cervical spine was performed following the standard protocol without intravenous contrast. Multiplanar CT image reconstructions of the cervical spine were also generated. RADIATION DOSE REDUCTION: This exam was performed according to the departmental dose-optimization program which includes automated exposure control, adjustment of the mA and/or kV according to patient size and/or use of iterative reconstruction technique. COMPARISON:  Head and cervical spine CT 01/30/2015 FINDINGS: CT HEAD FINDINGS Brain: Ventricles, cisterns and other CSF spaces are normal. No mass, mass effect, shift of midline structures or acute hemorrhage. No evidence of acute infarction. Vascular: No hyperdense vessel or unexpected calcification. Skull: Normal. Negative for fracture or focal lesion. Sinuses/Orbits: Hypoplastic frontal sinuses. Remaining paranasal sinuses are clear. Orbits are normal. Other: None. CT CERVICAL SPINE FINDINGS Alignment:  Normal. Skull base and vertebrae: Vertebral body heights are maintained. There is moderate spondylosis of the cervical spine to include uncovertebral joint spurring and facet arthropathy. Atlantoaxial articulation is unremarkable. No acute fracture. Significant right-sided neural foraminal narrowing at the C3-4 level with bilateral neural foraminal narrowing at the C4-5, C5-6 and C6-7 levels. Soft tissues and spinal canal: Prevertebral soft tissues are normal. Minimal canal stenosis at the C4-5 level. Disc levels:  Disc space narrowing from the C4-5 to the C6-7 levels. Upper chest: No acute findings. Other: None. IMPRESSION: 1. No acute brain injury. 2. No acute cervical spine injury. 3. 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 Minimal canal stenosis at the C4-5 level. Electronically Signed   By: Marin Olp M.D.   On: 07/26/2021 10:29   CT Cervical Spine Wo Contrast  Result Date: 07/26/2021 CLINICAL DATA:  Multiple falls recently. Head trauma, altered mental status. EXAM: CT HEAD WITHOUT CONTRAST CT CERVICAL SPINE WITHOUT CONTRAST TECHNIQUE: Multidetector CT imaging of the head and cervical spine was performed following the standard protocol without intravenous contrast. Multiplanar CT image reconstructions of the cervical spine were also generated. RADIATION DOSE REDUCTION: This exam was performed according to the departmental dose-optimization program which includes automated exposure control, adjustment of the mA and/or kV according to patient size and/or use of iterative reconstruction technique. COMPARISON:  Head and cervical spine CT 01/30/2015 FINDINGS: CT HEAD FINDINGS Brain: Ventricles, cisterns and other CSF spaces are normal. No mass, mass effect, shift of midline structures or acute hemorrhage. No evidence of acute infarction. Vascular: No hyperdense vessel or unexpected calcification. Skull: Normal. Negative  for fracture or focal lesion. Sinuses/Orbits: Hypoplastic frontal sinuses. Remaining paranasal sinuses are clear. Orbits are normal. Other: None. CT CERVICAL SPINE FINDINGS Alignment: Normal. Skull base and vertebrae: Vertebral body heights are maintained. There is moderate spondylosis of the cervical spine to include uncovertebral joint spurring and facet arthropathy. Atlantoaxial articulation is unremarkable. No acute fracture. Significant right-sided neural foraminal narrowing at the C3-4 level with bilateral neural foraminal narrowing at the C4-5, C5-6 and C6-7 levels. Soft tissues and spinal canal: Prevertebral soft tissues are normal. Minimal canal stenosis at the C4-5  level. Disc levels:  Disc space narrowing from the C4-5 to the C6-7 levels. Upper chest: No acute findings. Other: None. IMPRESSION: 1. No acute brain injury. 2. No acute cervical spine injury. 3. 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 Minimal canal stenosis at the C4-5 level. Electronically Signed   By: Marin Olp M.D.   On: 07/26/2021 10:29   DG Foot Complete Right  Result Date: 07/26/2021 CLINICAL DATA:  61 year old male with acute foot pain. EXAM: RIGHT FOOT COMPLETE - 3 VIEW COMPARISON:  None Available. FINDINGS: There is no evidence of acute fracture, subluxation or dislocation. The Lisfranc joints are unremarkable. A small plantar calcaneal spur is present. IMPRESSION: Small plantar calcaneal spur. Electronically Signed   By: Margarette Canada M.D.   On: 07/26/2021 10:19   DG Hip Unilat With Pelvis 2-3 Views Right  Result Date: 07/26/2021 CLINICAL DATA:  Fall with right hip pain. EXAM: DG HIP (WITH OR WITHOUT PELVIS) 2-3V RIGHT COMPARISON:  None Available. FINDINGS: Examination demonstrates minimal symmetric degenerative change of the hips. No evidence of acute fracture or dislocation. IMPRESSION: No acute findings. Electronically Signed   By:  Marin Olp M.D.   On: 07/26/2021 10:17      Assessment/Plan Principal Problem:   AKI (acute kidney injury) ([place]) Active Problems:   Hypotension   Multiple falls   Right rib fracture_7th rib   Leukocytosis   Hypokalemia   Essential hypertension   Tobacco use disorder   Depression with anxiety   Oxygen desaturation   Principal Problem:   AKI (acute kidney injury) ([place]) Active Problems:   Hypotension   Multiple falls   Right rib fracture_7th rib   Leukocytosis   Hypokalemia   Essential hypertension   Tobacco use disorder   Depression with anxiety   Oxygen desaturation   Assessment and Plan: * AKI (acute kidney injury) ([place]) Recent baseline creatinine 0.9 on 01/23/2021.  His creatinine is 5.44, BUN 25, GFR 11.  Renal ultrasound is negative.  Etiology is not clear, likely dehydration and continuation of diuretics and ACEI.  ATN is also possible due to hypotension.  -Admitted to telemetry bed as inpatient -IV fluid: 2.5 L normal saline, then 100 cc/h -Follow-up urinalysis -Consulted Dr. Candiss Norse for nephrology -Hold HCTZ and lisinopril -Follow-up with BMP -Avoid using renal toxic medications  Hypotension Etiology is not clear.  Possibly due to dehydration given recent diarrhea and continuation of blood pressure medications -Will hold blood pressure medications, including amlodipine, HCTZ and lisinopril -IV fluid: As above -Check cortisol level -Start midodrine 10 mg 3 times daily -Patient is DNR, but he states that he is okay to use vasopressor if needed  Multiple falls Images are negative for acute injury except for right seventh rib fracture.  Possibly due to dehydration and hypotension -PT/OT -Fall precaution.  Right rib fracture_7th rib - Incentive spirometry -As needed Percocet and Tylenol for pain  Leukocytosis WBC 12.5, no fever, no source of infection identified.  -Blood culture -Follow-up urinalysis -Follow-up with CBC  Hypokalemia Potassium  3.3 -Repleted potassium -Check a magnesium level --> 2.4  Essential hypertension - Hold all blood pressure medications due to hypotension  Tobacco use disorder - Nicotine patch  Depression with anxiety Denies suicidal homicidal ideations. - Amitriptyline  Oxygen desaturation Patient is not using oxygen normally.  He was found to have oxygen desaturation to 87% on room air.  Currently on 4 L oxygen with 98% of saturation.  Etiology  is not clear.  No history of CHF.  BNP 11.8.  Lungs clear to auscultation, does not seem to have COPD exacerbation.  Chest x-ray negative for infiltration.  D-dimer positive at 0.90.  Need to rule out PE  -As needed albuterol -Nasal cannula oxygen to maintain oxygen saturation above 93% -Follow-up VQ scan to rule out PE -Lower extremity venous Doppler to rule out a DVT due to positive D-dimer             DVT ppx: SQ Heparin    Code Status:  DNR (I discussed with patient and explained the meaning of CODE STATUS. Patient is very sure that he wants to be DNR), but pt said it is fine to use vasopressors.  Family Communication:   Yes, patient's mother   at bed side.     Disposition Plan:  Anticipate discharge back to previous environment  Consults called:  Dr. Candiss Norse of renal  Admission status and Level of care: Telemetry Medical:   as inpt     Severity of Illness:  The appropriate patient status for this patient is INPATIENT. Inpatient status is judged to be reasonable and necessary in order to provide the required intensity of service to ensure the patient's safety. The patient's presenting symptoms, physical exam findings, and initial radiographic and laboratory data in the context of their chronic comorbidities is felt to place them at high risk for further clinical deterioration. Furthermore, it is not anticipated that the patient will be medically stable for discharge from the hospital within 2 midnights of admission.   * I certify that at  the point of admission it is my clinical judgment that the patient will require inpatient hospital care spanning beyond 2 midnights from the point of admission due to high intensity of service, high risk for further deterioration and high frequency of surveillance required.*       Date of Service 07/26/2021    Ivor Costa [facility]   If 7PM-7AM, please contact night-coverage [email or web address] 07/26/2021, 2:43 PM

## 2021-07-26 NOTE — Assessment & Plan Note (Signed)
-   Hold all blood pressure medications due to hypotension

## 2021-07-27 DIAGNOSIS — F4312 Post-traumatic stress disorder, chronic: Secondary | ICD-10-CM

## 2021-07-27 DIAGNOSIS — J9601 Acute respiratory failure with hypoxia: Secondary | ICD-10-CM

## 2021-07-27 HISTORY — DX: Acute respiratory failure with hypoxia: J96.01

## 2021-07-27 LAB — CBC
HCT: 35.3 % — ABNORMAL LOW (ref 39.0–52.0)
Hemoglobin: 11.7 g/dL — ABNORMAL LOW (ref 13.0–17.0)
MCH: 28.9 pg (ref 26.0–34.0)
MCHC: 33.1 g/dL (ref 30.0–36.0)
MCV: 87.2 fL (ref 80.0–100.0)
Platelets: 261 10*3/uL (ref 150–400)
RBC: 4.05 MIL/uL — ABNORMAL LOW (ref 4.22–5.81)
RDW: 13.5 % (ref 11.5–15.5)
WBC: 7 10*3/uL (ref 4.0–10.5)
nRBC: 0 % (ref 0.0–0.2)

## 2021-07-27 LAB — BASIC METABOLIC PANEL
Anion gap: 5 (ref 5–15)
BUN: 34 mg/dL — ABNORMAL HIGH (ref 6–20)
CO2: 25 mmol/L (ref 22–32)
Calcium: 8.3 mg/dL — ABNORMAL LOW (ref 8.9–10.3)
Chloride: 109 mmol/L (ref 98–111)
Creatinine, Ser: 3.28 mg/dL — ABNORMAL HIGH (ref 0.61–1.24)
GFR, Estimated: 21 mL/min — ABNORMAL LOW (ref >60)
Glucose, Bld: 110 mg/dL — ABNORMAL HIGH (ref 70–99)
Potassium: 3.6 mmol/L (ref 3.5–5.1)
Sodium: 139 mmol/L (ref 135–145)

## 2021-07-27 LAB — HIV ANTIBODY (ROUTINE TESTING W REFLEX): HIV Screen 4th Generation wRfx: NONREACTIVE

## 2021-07-27 LAB — CORTISOL-AM, BLOOD: Cortisol - AM: 2.6 ug/dL — ABNORMAL LOW (ref 6.7–22.6)

## 2021-07-27 MED ORDER — SENNOSIDES-DOCUSATE SODIUM 8.6-50 MG PO TABS
2.0000 | ORAL_TABLET | Freq: Two times a day (BID) | ORAL | Status: DC
  Administered 2021-07-27 – 2021-07-28 (×3): 2 via ORAL
  Filled 2021-07-27 (×3): qty 2

## 2021-07-27 MED ORDER — HYDROXYZINE HCL 10 MG PO TABS: 10.0000 mg | ORAL_TABLET | Freq: Three times a day (TID) | ORAL | Status: DC | PRN

## 2021-07-27 MED ORDER — SODIUM CHLORIDE 0.9 % IV BOLUS
1000.0000 mL | Freq: Once | INTRAVENOUS | Status: AC
  Administered 2021-07-27: 1000 mL via INTRAVENOUS

## 2021-07-27 MED ORDER — QUETIAPINE FUMARATE 25 MG PO TABS
12.5000 mg | ORAL_TABLET | Freq: Two times a day (BID) | ORAL | Status: DC
  Administered 2021-07-27 – 2021-07-28 (×2): 12.5 mg via ORAL
  Filled 2021-07-27 (×2): qty 1

## 2021-07-27 MED ORDER — ALUM & MAG HYDROXIDE-SIMETH 200-200-20 MG/5ML PO SUSP
30.0000 mL | Freq: Four times a day (QID) | ORAL | Status: DC | PRN
  Administered 2021-07-27 – 2021-07-28 (×2): 30 mL via ORAL
  Filled 2021-07-27 (×2): qty 30

## 2021-07-27 MED ORDER — CHLORHEXIDINE GLUCONATE CLOTH 2 % EX PADS
6.0000 | MEDICATED_PAD | Freq: Every day | CUTANEOUS | Status: DC
  Administered 2021-07-27 – 2021-07-28 (×2): 6 via TOPICAL

## 2021-07-27 MED ORDER — LACTULOSE 10 GM/15ML PO SOLN
20.0000 g | Freq: Every day | ORAL | Status: DC | PRN
  Administered 2021-07-27: 20 g via ORAL
  Filled 2021-07-27: qty 30

## 2021-07-27 MED ORDER — TAMSULOSIN HCL 0.4 MG PO CAPS
0.4000 mg | ORAL_CAPSULE | Freq: Every day | ORAL | Status: DC
Start: 2021-07-27 — End: 2021-07-27

## 2021-07-27 NOTE — Consult Note (Signed)
[name]:07/27/2021    Date of Admission:  07/26/2021           Reason for Consult:  AKI   Referring Provider: Sharen Hones, MD Primary Care Provider: Langston Reusing, NP   History of Presenting Illness:  Alvin Gonzalez. is a 61 y.o. male with hypertension, GERD, depression, anxiety, chronic pain, tobacco abuse, history of suicidal ideation. He presented with generalized weakness, multiple falls at home and decreased urine output. Patient was found to be hypotensive, received IV resuscitation. Baseline creatinine is 0.91 from December 2022. Admission creatinine of 5.44 which has improved to 3.28 today.Renal ultrasound is unremarkable.  No hydronephrosis. Urine output charted at 750 cc overnight Blood pressure is still low normal at 86/52.  Midodrine has been ordered.  Of note-patient did not want his mother in the room during the interview.  Review of Systems: Review of Systems  Constitutional:  Positive for malaise/fatigue. Negative for chills and fever.  HENT:  Negative for hearing loss.   Eyes:  Negative for blurred vision and double vision.  Respiratory:  Negative for cough, hemoptysis, sputum production and shortness of breath.   Cardiovascular:  Negative for chest pain, palpitations and leg swelling.  Gastrointestinal:  Negative for heartburn, nausea and vomiting.  Genitourinary:  Negative for dysuria and urgency.  Musculoskeletal:  Negative for myalgias.  Skin:  Negative for rash.  Neurological:  Negative for dizziness and focal weakness.  Psychiatric/Behavioral:  Positive for depression.     Past Medical History:  Diagnosis Date   Anxiety    Chronic back pain    Depression    Depression    Hypertension    Intermittent explosive disorder    Suicide attempt ([place])    prior to 02/18/14 admission    Tobacco abuse     Social History   Tobacco Use   Smoking status: Every Day    Packs/day: 1.00    Years: 45.00     Total pack years: 45.00    Types: Cigarettes   Smokeless tobacco: Former    Quit date: 01/30/2015  Vaping Use   Vaping Use: Former   Quit date: 01/23/2017  Substance Use Topics   Alcohol use: Not Currently    Comment: 1-2 beers per week, last use 2018, drank 2 beers on his birthday this year 2021   Drug use: Yes    Types: Marijuana    Comment: special occasions , last use 05/19/21    Family History  Problem Relation Age of Onset   Diabetes Mother    Hypertension Mother    Other Mother        possible ovarian cancer   Heart disease Father    Hypertension Father    Cirrhosis Father    Liver cancer Father    Lung cancer Sister    Heart attack Maternal Grandmother    Suicidality Maternal Grandfather    Other Paternal Grandmother        unknown medical history   Suicidality Paternal Grandfather    Stomach cancer Cousin      OBJECTIVE: Blood pressure (!) 86/52, pulse 74, temperature 97.8 F (36.6 C), temperature source Oral, resp. rate 19, height '5\' 5"'$  (1.651 m), weight 73.3 kg, SpO2 (!) 89 %.  Physical Exam  Physical Exam: General:  No acute distress, laying in the bed  HEENT  anicteric, moist oral mucous membrane  Pulm/lungs  normal breathing effort, lungs are clear to auscultation  CVS/Heart  regular rhythm,  no rub or gallop  Abdomen:   Soft, nontender  Extremities:  No peripheral edema  Neurologic:  Alert, oriented, able to follow commands  Skin: Warm, dry     Lab Results Lab Results  Component Value Date   WBC 7.0 07/27/2021   HGB 11.7 (L) 07/27/2021   HCT 35.3 (L) 07/27/2021   MCV 87.2 07/27/2021   PLT 261 07/27/2021    Lab Results  Component Value Date   CREATININE 3.28 (H) 07/27/2021   BUN 34 (H) 07/27/2021   NA 139 07/27/2021   K 3.6 07/27/2021   CL 109 07/27/2021   CO2 25 07/27/2021    Lab Results  Component Value Date   ALT 13 07/26/2021   AST 19 07/26/2021   ALKPHOS 74 07/26/2021   BILITOT 0.8 07/26/2021     Microbiology: Recent  Results (from the past 240 hour(s))  SARS Coronavirus 2 by RT PCR (hospital order, performed in [place] hospital lab) *cepheid single result test* Anterior Nasal Swab     Status: None   Collection Time: 07/26/21  1:15 PM   Specimen: Anterior Nasal Swab  Result Value Ref Range Status   SARS Coronavirus 2 by RT PCR NEGATIVE NEGATIVE Final    Comment: (NOTE) SARS-CoV-2 target nucleic acids are NOT DETECTED.  The SARS-CoV-2 RNA is generally detectable in upper and lower respiratory specimens during the acute phase of infection. The lowest concentration of SARS-CoV-2 viral copies this assay can detect is 250 copies / mL. A negative result does not preclude SARS-CoV-2 infection and should not be used as the sole basis for treatment or other patient management decisions.  A negative result may occur with improper specimen collection / handling, submission of specimen other than nasopharyngeal swab, presence of viral mutation(s) within the areas targeted by this assay, and inadequate number of viral copies (<250 copies / mL). A negative result must be combined with clinical observations, patient history, and epidemiological information.  Fact Sheet for Patients:   https://www.patel.info/  Fact Sheet for Healthcare Providers: https://hall.com/  This test is not yet approved or  cleared by the [place] FDA and has been authorized for detection and/or diagnosis of SARS-CoV-2 by FDA under an Emergency Use Authorization (EUA).  This EUA will remain in effect (meaning this test can be used) for the duration of the COVID-19 declaration under Section 564(b)(1) of the Act, 21 U.S.C. section 360bbb-3(b)(1), unless the authorization is terminated or revoked sooner.  Performed at [facility], [address] [place], [place] [address]   Culture, blood (Routine X 2) w Reflex to ID Panel     Status: None (Preliminary result)   Collection  Time: 07/26/21  7:09 PM   Specimen: BLOOD  Result Value Ref Range Status   Specimen Description BLOOD RIGHT [facility]  Final   Special Requests   Final    BOTTLES DRAWN AEROBIC AND ANAEROBIC Blood Culture results may not be optimal due to an excessive volume of blood received in culture bottles   Culture   Final    NO GROWTH < 12 HOURS Performed at [facility], [place], [address] [place], [place] [address]    Report Status PENDING  Incomplete  Culture, blood (Routine X 2) w Reflex to ID Panel     Status: None (Preliminary result)   Collection Time: 07/26/21  7:14 PM   Specimen: BLOOD  Result Value Ref Range Status   Specimen Description BLOOD RIGHT HAND  Final   Special Requests  Final    BOTTLES DRAWN AEROBIC AND ANAEROBIC Blood Culture results may not be optimal due to an excessive volume of blood received in culture bottles   Culture   Final    NO GROWTH < 12 HOURS Performed at [facility], [address] [place], [place] [address]    Report Status PENDING  Incomplete    Medications: Scheduled Meds:  amitriptyline  25 mg Oral QHS   aspirin  81 mg Oral Daily   heparin  5,000 Units Subcutaneous Q8H   midodrine  10 mg Oral TID WC   nicotine  21 mg Transdermal Daily   pantoprazole  40 mg Oral Daily   Continuous Infusions:  sodium chloride 100 mL/hr at 07/27/21 0530   PRN Meds:.acetaminophen, albuterol, dextromethorphan-guaiFENesin, ondansetron (ZOFRAN) IV, oxyCODONE-acetaminophen  Allergies  Allergen Reactions   Carbamazepine Rash and Hives   Ibuprofen Nausea And Vomiting and Rash    GI Bleeding    Risperidone Swelling    Tongue swelling Tongue swelling Tongue swelling Tongue swelling? Pt self report   Naproxen Other (See Comments)    stomach bleeding stomach bleeding stomach bleeding    Urinalysis: Recent Labs    07/26/21 2237  COLORURINE YELLOW*  LABSPEC 1.012  PHURINE 5.0  GLUCOSEU 50*  HGBUR NEGATIVE  BILIRUBINUR NEGATIVE  KETONESUR  NEGATIVE  PROTEINUR 30*  NITRITE NEGATIVE  LEUKOCYTESUR NEGATIVE      Imaging: US Venous Img Lower Bilateral (DVT)  Result Date: 07/26/2021 CLINICAL DATA:  220371, positive D-dimer. EXAM: BILATERAL LOWER EXTREMITY VENOUS DOPPLER ULTRASOUND TECHNIQUE: Gray-scale sonography with graded compression, as well as color Doppler and duplex ultrasound were performed to evaluate the lower extremity deep venous systems from the level of the common femoral vein and including the common femoral, femoral, profunda femoral, popliteal and calf veins including the posterior tibial, peroneal and gastrocnemius veins when visible. The superficial great saphenous vein was also interrogated. Spectral Doppler was utilized to evaluate flow at rest and with distal augmentation maneuvers in the common femoral, femoral and popliteal veins. COMPARISON:  None Available. FINDINGS: RIGHT LOWER EXTREMITY Common Femoral Vein: No evidence of thrombus. Normal compressibility, respiratory phasicity and response to augmentation. Saphenofemoral Junction: No evidence of thrombus. Normal compressibility and flow on color Doppler imaging. Profunda Femoral Vein: No evidence of thrombus. Normal compressibility and flow on color Doppler imaging. Femoral Vein: No evidence of thrombus. Normal compressibility, respiratory phasicity and response to augmentation. Popliteal Vein: No evidence of thrombus. Normal compressibility, respiratory phasicity and response to augmentation. Calf Veins: No evidence of thrombus. Normal compressibility and flow on color Doppler imaging. Superficial Great Saphenous Vein: No evidence of thrombus. Normal compressibility. Venous Reflux:  None. Other Findings:  None. LEFT LOWER EXTREMITY Common Femoral Vein: No evidence of thrombus. Normal compressibility, respiratory phasicity and response to augmentation. Saphenofemoral Junction: No evidence of thrombus. Normal compressibility and flow on color Doppler imaging. Profunda  Femoral Vein: No evidence of thrombus. Normal compressibility and flow on color Doppler imaging. Femoral Vein: No evidence of thrombus. Normal compressibility, respiratory phasicity and response to augmentation. Popliteal Vein: No evidence of thrombus. Normal compressibility, respiratory phasicity and response to augmentation. Calf Veins: No evidence of thrombus. Normal compressibility and flow on color Doppler imaging. Superficial Great Saphenous Vein: No evidence of thrombus. Normal compressibility. Venous Reflux:  None. Other Findings:  None. IMPRESSION: No evidence of deep venous thrombosis in either lower extremity. Electronically Signed   By: Iven Finn M.D.   On: 07/26/2021 16:31   US Renal  Result Date: 07/26/2021 CLINICAL DATA:  61 year old male with acute renal failure EXAM: RENAL / URINARY TRACT ULTRASOUND COMPLETE COMPARISON:  01/30/2015 CT and prior studies. FINDINGS: Right Kidney: Renal measurements: 11.5 x 4.5 x 5.5 cm = volume: 149 mL. Echogenicity within normal limits. No suspicious mass or hydronephrosis visualized. Left Kidney: Renal measurements: 10.8 x 5.6 x 4.9 cm = volume: 160 mL. Echogenicity within normal limits. No suspicious mass or hydronephrosis visualized. Bladder: Appears normal for degree of bladder distention. Other: None. IMPRESSION: Unremarkable renal ultrasound. Electronically Signed   By: Margarette Canada M.D.   On: 07/26/2021 12:49   DG Chest Port 1 View  Result Date: 07/26/2021 CLINICAL DATA:  Weakness EXAM: PORTABLE CHEST 1 VIEW COMPARISON:  Chest x-ray dated September 28, 2014 FINDINGS: Cardiac and mediastinal contours within normal limits for AP technique. Blunting of the right costophrenic angle. Mild bibasilar opacities, likely due to atelectasis. No large pleural effusion or pneumothorax. Age indeterminate fracture of the lateral right 7th rib, new when compared with 2016 prior exam. IMPRESSION: 1. Blunting of the right costophrenic angle, possibly due to small right  pleural effusion or pleural thickening. 2. Age-indeterminate fracture of the lateral right 7th rib, correlate for point tenderness. Electronically Signed   By: Yetta Glassman M.D.   On: 07/26/2021 11:22   CT Head Wo Contrast  Result Date: 07/26/2021 CLINICAL DATA:  Multiple falls recently. Head trauma, altered mental status. EXAM: CT HEAD WITHOUT CONTRAST CT CERVICAL SPINE WITHOUT CONTRAST TECHNIQUE: Multidetector CT imaging of the head and cervical spine was performed following the standard protocol without intravenous contrast. Multiplanar CT image reconstructions of the cervical spine were also generated. RADIATION DOSE REDUCTION: This exam was performed according to the departmental dose-optimization program which includes automated exposure control, adjustment of the mA and/or kV according to patient size and/or use of iterative reconstruction technique. COMPARISON:  Head and cervical spine CT 01/30/2015 FINDINGS: CT HEAD FINDINGS Brain: Ventricles, cisterns and other CSF spaces are normal. No mass, mass effect, shift of midline structures or acute hemorrhage. No evidence of acute infarction. Vascular: No hyperdense vessel or unexpected calcification. Skull: Normal. Negative for fracture or focal lesion. Sinuses/Orbits: Hypoplastic frontal sinuses. Remaining paranasal sinuses are clear. Orbits are normal. Other: None. CT CERVICAL SPINE FINDINGS Alignment: Normal. Skull base and vertebrae: Vertebral body heights are maintained. There is moderate spondylosis of the cervical spine to include uncovertebral joint spurring and facet arthropathy. Atlantoaxial articulation is unremarkable. No acute fracture. Significant right-sided neural foraminal narrowing at the C3-4 level with bilateral neural foraminal narrowing at the C4-5, C5-6 and C6-7 levels. Soft tissues and spinal canal: Prevertebral soft tissues are normal. Minimal canal stenosis at the C4-5 level. Disc levels:  Disc space narrowing from the C4-5 to  the C6-7 levels. Upper chest: No acute findings. Other: None. IMPRESSION: 1. No acute brain injury. 2. No acute cervical spine injury. 3. 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 Minimal canal stenosis at the C4-5 level. Electronically Signed   By: Marin Olp M.D.   On: 07/26/2021 10:29   CT Cervical Spine Wo Contrast  Result Date: 07/26/2021 CLINICAL DATA:  Multiple falls recently. Head trauma, altered mental status. EXAM: CT HEAD WITHOUT CONTRAST CT CERVICAL SPINE WITHOUT CONTRAST TECHNIQUE: Multidetector CT imaging of the head and cervical spine was performed following the standard protocol without intravenous contrast. Multiplanar CT image reconstructions of the cervical spine were also generated. RADIATION DOSE REDUCTION: This exam was  performed according to the departmental dose-optimization program which includes automated exposure control, adjustment of the mA and/or kV according to patient size and/or use of iterative reconstruction technique. COMPARISON:  Head and cervical spine CT 01/30/2015 FINDINGS: CT HEAD FINDINGS Brain: Ventricles, cisterns and other CSF spaces are normal. No mass, mass effect, shift of midline structures or acute hemorrhage. No evidence of acute infarction. Vascular: No hyperdense vessel or unexpected calcification. Skull: Normal. Negative for fracture or focal lesion. Sinuses/Orbits: Hypoplastic frontal sinuses. Remaining paranasal sinuses are clear. Orbits are normal. Other: None. CT CERVICAL SPINE FINDINGS Alignment: Normal. Skull base and vertebrae: Vertebral body heights are maintained. There is moderate spondylosis of the cervical spine to include uncovertebral joint spurring and facet arthropathy. Atlantoaxial articulation is unremarkable. No acute fracture. Significant right-sided neural foraminal narrowing at the C3-4 level with bilateral neural foraminal narrowing at the C4-5,  C5-6 and C6-7 levels. Soft tissues and spinal canal: Prevertebral soft tissues are normal. Minimal canal stenosis at the C4-5 level. Disc levels:  Disc space narrowing from the C4-5 to the C6-7 levels. Upper chest: No acute findings. Other: None. IMPRESSION: 1. No acute brain injury. 2. No acute cervical spine injury. 3. 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 Minimal canal stenosis at the C4-5 level. Electronically Signed   By: Marin Olp M.D.   On: 07/26/2021 10:29   DG Foot Complete Right  Result Date: 07/26/2021 CLINICAL DATA:  60 year old male with acute foot pain. EXAM: RIGHT FOOT COMPLETE - 3 VIEW COMPARISON:  None Available. FINDINGS: There is no evidence of acute fracture, subluxation or dislocation. The Lisfranc joints are unremarkable. A small plantar calcaneal spur is present. IMPRESSION: Small plantar calcaneal spur. Electronically Signed   By: Margarette Canada M.D.   On: 07/26/2021 10:19   DG Hip Unilat With Pelvis 2-3 Views Right  Result Date: 07/26/2021 CLINICAL DATA:  Fall with right hip pain. EXAM: DG HIP (WITH OR WITHOUT PELVIS) 2-3V RIGHT COMPARISON:  None Available. FINDINGS: Examination demonstrates minimal symmetric degenerative change of the hips. No evidence of acute fracture or dislocation. IMPRESSION: No acute findings. Electronically Signed   By: Marin Olp M.D.   On: 07/26/2021 10:17      Assessment/Plan:  Dontavis Tschantz. is a 61 y.o. male with medical problems of hypertension, GERD, depression, anxiety, chronic pain, tobacco abuse, history of suicidal ideation  was admitted on 07/26/2021 for :  Dehydration [E86.0] AKI (acute kidney injury) ([place]) [N17.9] Fall, initial encounter [W19.XXXA]  Acute kidney injury with proteinuria Urinalysis-glucosuria, mild proteinuria, 0-5 RBCs, 0-5 WBCs Imaging: Renal ultrasound 07/26/2021-unremarkable. Baseline creatinine of 0.91 from  December 2022. AKI likely secondary to hypotension.  He is responding well to fluid resuscitation. Home medication regimen of amlodipine, hydrochlorothiazide, lisinopril. Agree with holding all antihypertensives at present. Patient reports some difficulty with urination.  Empiric trial of Flomax. Will obtain urine p/c ratio Electrolytes and volume status are acceptable.  No acute indication for dialysis at present.  Alvin Gonzalez 07/27/21

## 2021-07-27 NOTE — Progress Notes (Signed)
Patient request no staff members talk with his mother. He does not want her to visit nor a [employer]

## 2021-07-27 NOTE — Progress Notes (Signed)
OT Cancellation Note  Patient Details Name: Alvin Gonzalez. MRN: [id number] DOB: [birth year]   Cancelled Treatment:    Reason Eval/Treat Not Completed: Patient not medically ready. Received orders, reviewed chart. Pt is awaiting imaging to rule out PE. Will hold OT evaluation until this imaging is completed and pt is deemed to be clear of PE.  Josiah Lobo 07/27/2021, 9:20 AM
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
## 2021-07-27 NOTE — Hospital Course (Signed)
Alvin Gonzalez. is a 61 y.o. male with medical history significant of hypertension, GERD, depression, anxiety, chronic pain, tobacco abuse, history of suicidal ideation, who presents with generalized weakness, multiple falls, decreased urine output.  Patient had a significant diarrhea about a week ago, still nauseous with poor p.o. intake.  Blood pressure was low, he was given fluids.  Renal function is gradually improving. Patient was not able to urinate, bladder scan showed overall 800 mL on 6/18.  Foley catheter anchored.

## 2021-07-27 NOTE — Progress Notes (Signed)
  Progress Note   Patient: Alvin Gonzalez. RAQ:[id number] DOB: 1960/05/01 DOA: 07/26/2021     1 DOS: the patient was seen and examined on 07/27/2021   Brief hospital course: Alvin Carsey. is a 61 y.o. male with medical history significant of hypertension, GERD, depression, anxiety, chronic pain, tobacco abuse, history of suicidal ideation, who presents with generalized weakness, multiple falls, decreased urine output.  Patient had a significant diarrhea about a week ago, still nauseous with poor p.o. intake.  Blood pressure was low, he was given fluids.  Renal function is gradually improving. Patient was not able to urinate, bladder scan showed overall 800 mL on 6/18.  Foley catheter anchored.  Assessment and Plan: Acute renal failure secondary to dehydration and a urinary retention. 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 Hypokalemia. Hypotension secondary to dehydration on blood pressure medicine. Patient had a creatinine level of 5.44 at the time of admission, this appears to be secondary to dehydration with recent diarrhea and nausea vomiting.  Patient also has significant urinary retention with bladder scan more than 800 mL, patient was not able to urinate.  Foley catheter will be anchored, cannot start Flomax yet due to low blood pressure. Patient blood pressure was low this morning, he received 1000 mL of normal saline bolus. Patient has a BNP of 11.8, he does not have any volume overload. Renal ultrasound unremarkable  Acute hypoxemic respiratory failure. Presumed COPD. Patient was a heavy smoker previously, most likely has a COPD.  He had oxygen saturation dropped down to 87%, requiring 4 L oxygen. Patient has a mild elevation of D-dimer in the setting of severe renal failure.  Duplex ultrasound of bilateral lower extremity has no DVT, he is obtaining for VQ scan.  However, probability of a PE is very low. Patient also does not have volume overload.   Continue DuoNeb.  Seventh rib fracture. Symptomatic treatment.  Incentive spirometer.  Depression anxiety. Continue amitriptyline.      Subjective:  Patient does not complain of shortness of breath, no wheezing.  He could not urinate, bladder scan showed over 800 mL residual.  Foley catheter is anchored.  UA does not suggest UTI.  Physical Exam: Vitals:   07/27/21 0114 07/27/21 0406 07/27/21 0500 07/27/21 0747  BP: (!) 81/49 (!) 82/63  (!) 86/52  Pulse: 81 79  74  Resp: '18 20  19  '$ Temp: 97.9 F (36.6 C) 98.1 F (36.7 C)  97.8 F (36.6 C)  TempSrc: Oral Oral  Oral  SpO2: 91% 98%  (!) 89%  Weight:   73.3 kg   Height:       General exam: Appears calm and comfortable  Respiratory system: Decreased breathing sounds. Respiratory effort normal. Cardiovascular system: S1 & S2 heard, RRR. No JVD, murmurs, rubs, gallops or clicks. No pedal edema. Gastrointestinal system: Abdomen is nondistended, soft and nontender. No organomegaly or masses felt. Normal bowel sounds heard. Central nervous system: Alert and oriented. No focal neurological deficits. Extremities: Symmetric 5 x 5 power. Skin: No rashes, lesions or ulcers Psychiatry: Judgement and insight appear normal. Mood & affect appropriate.   Data Reviewed:  Reviewed chest x-ray, renal ultrasound, duplex ultrasound.  All lab results.  Family Communication:   Disposition: Status is: Inpatient Remains inpatient appropriate because: Severity of disease, hypoxemia, IV treatment.  Planned Discharge Destination: Home    Time spent: 55 minutes  Author: Sharen Hones, MD 07/27/2021 11:59 AM  For on call review [email or web address].

## 2021-07-27 NOTE — Discharge Instructions (Signed)
[facility] Contact:  Lanae Boast [phone]

## 2021-07-27 NOTE — Progress Notes (Signed)
PT Cancellation Note  Patient Details Name: Alvin Gonzalez. MRN: [id number] DOB: February 10, 1960   Cancelled Treatment:    Reason Eval/Treat Not Completed: Patient not medically ready PT orders received, chart reviewed. Pt noted to have orders for VQ scan to r/o PE. Will hold PT evaluation until results have returned.  Lavone Nian, PT, DPT 07/27/21, 8:24 AM   Waunita Schooner 07/27/2021, 8:23 AM

## 2021-07-28 ENCOUNTER — Emergency Department
Admission: EM | Admit: 2021-07-28 | Discharge: 2021-07-28 | Disposition: A | Discharge status: Home / Self Care | Payer: Medicaid Other | Attending: Emergency Medicine | Admitting: Emergency Medicine

## 2021-07-28 ENCOUNTER — Other Ambulatory Visit: Payer: Self-pay

## 2021-07-28 ENCOUNTER — Inpatient Hospital Stay: Payer: Medicaid Other

## 2021-07-28 DIAGNOSIS — E2740 Unspecified adrenocortical insufficiency: Secondary | ICD-10-CM

## 2021-07-28 DIAGNOSIS — J9601 Acute respiratory failure with hypoxia: Secondary | ICD-10-CM

## 2021-07-28 DIAGNOSIS — I9589 Other hypotension: Secondary | ICD-10-CM

## 2021-07-28 DIAGNOSIS — Z5321 Procedure and treatment not carried out due to patient leaving prior to being seen by health care provider: Secondary | ICD-10-CM | POA: Insufficient documentation

## 2021-07-28 DIAGNOSIS — N179 Acute kidney failure, unspecified: Secondary | ICD-10-CM

## 2021-07-28 DIAGNOSIS — I10 Essential (primary) hypertension: Secondary | ICD-10-CM | POA: Insufficient documentation

## 2021-07-28 DIAGNOSIS — K649 Unspecified hemorrhoids: Secondary | ICD-10-CM | POA: Insufficient documentation

## 2021-07-28 DIAGNOSIS — K5900 Constipation, unspecified: Secondary | ICD-10-CM | POA: Insufficient documentation

## 2021-07-28 DIAGNOSIS — J441 Chronic obstructive pulmonary disease with (acute) exacerbation: Secondary | ICD-10-CM | POA: Insufficient documentation

## 2021-07-28 LAB — CBC
HCT: 35 % — ABNORMAL LOW (ref 39.0–52.0)
Hemoglobin: 11.9 g/dL — ABNORMAL LOW (ref 13.0–17.0)
MCH: 29.4 pg (ref 26.0–34.0)
MCHC: 34 g/dL (ref 30.0–36.0)
MCV: 86.4 fL (ref 80.0–100.0)
Platelets: 256 10*3/uL (ref 150–400)
RBC: 4.05 MIL/uL — ABNORMAL LOW (ref 4.22–5.81)
RDW: 13.1 % (ref 11.5–15.5)
WBC: 7.2 10*3/uL (ref 4.0–10.5)
nRBC: 0 % (ref 0.0–0.2)

## 2021-07-28 LAB — BASIC METABOLIC PANEL
Anion gap: 5 (ref 5–15)
BUN: 21 mg/dL — ABNORMAL HIGH (ref 6–20)
CO2: 24 mmol/L (ref 22–32)
Calcium: 8.4 mg/dL — ABNORMAL LOW (ref 8.9–10.3)
Chloride: 109 mmol/L (ref 98–111)
Creatinine, Ser: 1.22 mg/dL (ref 0.61–1.24)
GFR, Estimated: >60 mL/min (ref >60)
Glucose, Bld: 102 mg/dL — ABNORMAL HIGH (ref 70–99)
Potassium: 3.7 mmol/L (ref 3.5–5.1)
Sodium: 138 mmol/L (ref 135–145)

## 2021-07-28 LAB — URINALYSIS, ROUTINE W REFLEX MICROSCOPIC
Bilirubin Urine: NEGATIVE
Glucose, UA: 50 mg/dL — AB
Ketones, ur: NEGATIVE mg/dL
Leukocytes,Ua: NEGATIVE
Nitrite: NEGATIVE
Protein, ur: 100 mg/dL — AB
RBC / HPF: >50 RBC/hpf — ABNORMAL HIGH (ref 0–5)
Specific Gravity, Urine: 1.017 (ref 1.005–1.030)
Squamous Epithelial / HPF: NONE SEEN (ref 0–5)
WBC, UA: >50 WBC/hpf — ABNORMAL HIGH (ref 0–5)
pH: 7 (ref 5.0–8.0)

## 2021-07-28 LAB — PROCALCITONIN: Procalcitonin: <0.1 ng/mL

## 2021-07-28 LAB — MAGNESIUM: Magnesium: 1.9 mg/dL (ref 1.7–2.4)

## 2021-07-28 LAB — PROTEIN / CREATININE RATIO, URINE
Creatinine, Urine: 97 mg/dL
Protein Creatinine Ratio: 0.2 mg/mg{Cre} — ABNORMAL HIGH (ref 0.00–0.15)
Total Protein, Urine: 19 mg/dL

## 2021-07-28 MED ORDER — TECHNETIUM TO 99M ALBUMIN AGGREGATED
4.2100 | Freq: Once | INTRAVENOUS | Status: AC | PRN
  Administered 2021-07-28: 4.21 via INTRAVENOUS

## 2021-07-28 MED ORDER — SALINE SPRAY 0.65 % NA SOLN
1.0000 | NASAL | Status: DC | PRN
  Filled 2021-07-28: qty 44

## 2021-07-28 MED ORDER — TAMSULOSIN HCL 0.4 MG PO CAPS
0.4000 mg | ORAL_CAPSULE | Freq: Every day | ORAL | Status: DC
  Administered 2021-07-28: 0.4 mg via ORAL
  Filled 2021-07-28: qty 1

## 2021-07-28 MED ORDER — PREDNISONE 5 MG PO TABS
2.5000 mg | ORAL_TABLET | Freq: Two times a day (BID) | ORAL | 0 refills | Status: DC
  Filled 2021-07-28: qty 30, 30d supply, fill #0

## 2021-07-28 MED ORDER — TAMSULOSIN HCL 0.4 MG PO CAPS
0.4000 mg | ORAL_CAPSULE | Freq: Every day | ORAL | 0 refills | Status: DC
  Filled 2021-07-28: qty 5, 5d supply, fill #0
  Filled 2021-07-28: qty 25, 25d supply, fill #0

## 2021-07-28 MED ORDER — QUETIAPINE FUMARATE 25 MG PO TABS
12.5000 mg | ORAL_TABLET | Freq: Two times a day (BID) | ORAL | 0 refills | Status: DC
  Filled 2021-07-28: qty 8, 8d supply, fill #0
  Filled 2021-07-28: qty 22, 22d supply, fill #0

## 2021-07-28 MED ORDER — PREDNISONE 2.5 MG PO TABS
2.5000 mg | ORAL_TABLET | Freq: Two times a day (BID) | ORAL | Status: DC
  Administered 2021-07-28: 2.5 mg via ORAL
  Filled 2021-07-28 (×2): qty 1

## 2021-07-28 NOTE — ED Triage Notes (Signed)
Pt presents to ER via ems from home c/o "pain in my ass."  Pt states he had hemorrhoid banded on Thursday at GI office and states he was here on Saturday and admitted for AKI and released yesterday.  Pt states he has not had BM since before surgery and states he wants something for his hemorrhoids.  Pt states he cut off bag end of foley catheter because he thinks that is the reason he has not been able to have BM.  Pt is A&O x4 at this time in NAD.

## 2021-07-28 NOTE — Progress Notes (Signed)
Received call from [place] stating patient was refusing to wear his oxygen because it "hurt his nose."

## 2021-07-28 NOTE — Progress Notes (Signed)
   07/26/21 2339  Assess: MEWS Score  Temp 98.3 F (36.8 C)  BP (!) 77/49  MAP (mmHg) (!) 57  Pulse Rate 77  Resp 18  SpO2 97 %  O2 Device Room Air  Assess: MEWS Score  MEWS Temp 0  MEWS Systolic 2  MEWS Pulse 0  MEWS RR 0  MEWS LOC 0  MEWS Score 2  MEWS Score Color Yellow  Assess: if the MEWS score is Yellow or Red  Were vital signs taken at a resting state? Yes  Focused Assessment No change from prior assessment  Does the patient meet 2 or more of the SIRS criteria? No  MEWS guidelines implemented *See Row Information* Yes  Treat  MEWS Interventions Escalated (See documentation below)  Take Vital Signs  Increase Vital Sign Frequency  Yellow: Q 2hr X 2 then Q 4hr X 2, if remains yellow, continue Q 4hrs  Escalate  MEWS: Escalate Yellow: discuss with charge nurse/RN and consider discussing with provider and RRT  Notify: Charge Nurse/RN  Name of Charge Nurse/RN Notified Mariane Baumgarten RN  Date Charge Nurse/RN Notified 07/27/21  Time Charge Nurse/RN Notified 2340  Assess: SIRS CRITERIA  SIRS Temperature  0  SIRS Pulse 0  SIRS Respirations  0  SIRS WBC 0  SIRS Score Sum  0

## 2021-07-28 NOTE — Progress Notes (Signed)
Patient to radiology in stable condition via transport team with 2L [place] in place.

## 2021-07-28 NOTE — Progress Notes (Signed)
Patient transported to [facility] in stable condition via transport team. Patient irritable and agitated, but agreeable to going for testing.

## 2021-07-28 NOTE — Discharge Summary (Signed)
Physician Discharge Summary   Patient: Alvin Gonzalez. MRN: [id number] DOB: 1960-04-14  Admit date:     07/26/2021  Discharge date: 07/28/21  Discharge Physician: Sharen Hones   PCP: Langston Reusing, NP   Recommendations at discharge:   Follow-up with PCP in 1 week. Keep  Foley catheter until seen by urology in 2 weeks.  Discharge Diagnoses: Principal Problem:   AKI (acute kidney injury) ([place]) Active Problems:   Chronic posttraumatic stress disorder   Hypotension   Right rib fracture_7th rib   Leukocytosis   Hypokalemia   Essential hypertension   Tobacco use disorder   Oxygen desaturation   Acute hypoxemic respiratory failure ([facility])   Adrenal insufficiency ([place])  Resolved Problems:   Multiple falls   Depression with anxiety  Hospital Course: Alvin Goth. is a 61 y.o. male with medical history significant of hypertension, GERD, depression, anxiety, chronic pain, tobacco abuse, history of suicidal ideation, who presents with generalized weakness, multiple falls, decreased urine output.  Patient had a significant diarrhea about a week ago, still nauseous with poor p.o. intake.  Blood pressure was low, he was given fluids.  Renal function is gradually improving. Patient was not able to urinate, bladder scan showed overall 800 mL on 6/18.  Foley catheter anchored.  Assessment and Plan: Acute renal failure secondary to dehydration and a urinary retention. 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 Hypokalemia. Hypotension secondary to dehydration on blood pressure medicine. Adrenal insufficiency. Patient had a creatinine level of 5.44 at the time of admission, this appears to be secondary to dehydration with recent diarrhea and nausea vomiting.  Patient also has adrenal insufficiency with a.m. cortisol level of 2.6.  Patient also has significant urinary retention with bladder scan more than 800 mL, patient was not able to urinate.  Foley catheter  will be anchored, renal ultrasound unremarkable. Patient was giving IV fluids bolus and continued infusion.  Renal function has normalized today. At this point, patient will be continued on Flomax, prednisone for adrenal insufficiency.  Keep Foley catheter and follow-up with urology as outpatient.  Acute hypoxemic respiratory failure. Presumed COPD. Pleural effusion Patient was a heavy smoker previously, most likely has a COPD.  He had oxygen saturation dropped down to 87%, requiring 4 L oxygen. Patient has a mild elevation of D-dimer in the setting of severe renal failure.  Duplex ultrasound of bilateral lower extremity has no DVT, pulm limb perfusion study has no evidence of PE. Patient also does not have volume overload.  Patient condition so far had improved, he was off oxygen without shortness of breath.  He will follow-up with PCP as outpatient. Patient also had worsening pleural effusion after fluids, no evidence of congestive heart failure.  Procalcitonin level less than 0.1, no evidence of infection.  Patient can have a chest x-ray repeated in 1 week with PCP.   Seventh rib fracture. Stable, follow-up with PCP as outpatient.  Depression anxiety. Continue amitriptyline. He is seen by psychiatry, Seroquel was started.       Consultants: Nephrology Procedures performed: Foley  Disposition: Home Diet recommendation:  Discharge Diet Orders (From admission, onward)     Start     Ordered   07/28/21 0000  Diet - low sodium heart healthy        07/28/21 1237           Cardiac diet DISCHARGE MEDICATION: Allergies as of 07/28/2021       Reactions   Carbamazepine Rash, Hives  Ibuprofen Nausea And Vomiting, Rash   GI Bleeding   Risperidone Swelling   Tongue swelling Tongue swelling Tongue swelling Tongue swelling? Pt self report   Naproxen Other (See Comments)   stomach bleeding stomach bleeding stomach bleeding        Medication List     STOP taking these  medications    amLODipine 10 MG tablet Commonly known as: NORVASC   hydrochlorothiazide 25 MG tablet Commonly known as: HYDRODIURIL   lisinopril 30 MG tablet Commonly known as: ZESTRIL       TAKE these medications    acetaminophen 500 MG tablet Commonly known as: TYLENOL Take 1,000 mg by mouth every 6 (six) hours as needed.   amitriptyline 25 MG tablet Commonly known as: ELAVIL Take 1 tablet (25 mg total) by mouth at bedtime.   aspirin 81 MG chewable tablet Chew 81 mg by mouth daily.   gabapentin 400 MG capsule Commonly known as: NEURONTIN Take 1 capsule (400 mg total) by mouth 3 (three) times daily.   pantoprazole 40 MG tablet Commonly known as: PROTONIX Take 1 tablet (40 mg total) by mouth once daily.   predniSONE 5 MG tablet Commonly known as: DELTASONE Take 1/2 tablet (2.5 mg total) by mouth 2 (two) times daily with a meal. (Take 0.5 tablets (2.5 mg total) by mouth 2 (two) times daily with a meal.)   QUEtiapine 25 MG tablet Commonly known as: SEROQUEL Take 0.5 tablets (12.5 mg total) by mouth 2 (two) times daily.   tamsulosin 0.4 MG Caps capsule Commonly known as: FLOMAX Take 1 capsule (0.4 mg total) by mouth daily. Start taking on: July 29, 2021   [place] "Off brand arthritis medicine"        Follow-up Information     Iloabachie, Chioma E, NP Follow up in 1 week(s).   Specialty: Gerontology Contact information: [place] [address] [phone]         Billey Co, MD Follow up in 2 week(s).   Specialty: Urology Contact information: [place] [address] [phone]                Discharge Exam: Danley Danker Weights   07/26/21 2000 07/27/21 0500 07/28/21 0500  Weight: 72.1 kg 73.3 kg 72.9 kg   General exam: Appears calm and comfortable  Respiratory system: Clear to auscultation. Respiratory effort normal. Cardiovascular system: S1 & S2 heard, RRR. No JVD, murmurs, rubs,  gallops or clicks. No pedal edema. Gastrointestinal system: Abdomen is nondistended, soft and nontender. No organomegaly or masses felt. Normal bowel sounds heard. Central nervous system: Alert and oriented. No focal neurological deficits. Extremities: Symmetric 5 x 5 power. Skin: No rashes, lesions or ulcers Psychiatry: Judgement and insight appear normal. Mood & affect appropriate.    Condition at discharge: good  The results of significant diagnostics from this hospitalization (including imaging, microbiology, ancillary and laboratory) are listed below for reference.   Imaging Studies: DG Chest 2 View  Result Date: 07/28/2021 CLINICAL DATA:  Shortness of breath and weakness. EXAM: CHEST - 2 VIEW COMPARISON:  09/28/2014 FINDINGS: Lungs are hyperinflated. Heart size is normal. There is new RIGHT LOWER lobe consolidation and pleural effusion. Small LEFT pleural effusion and associated atelectasis. No pulmonary edema. Mild wedge compression fractures in the LOWER thoracic levels, at least involving T9, T10, T11, and T12. IMPRESSION: New bilateral pleural effusions, RIGHT LOWER lobe consolidation and LEFT LOWER lobe atelectasis. Chronic thoracic compression fractures. Electronically Signed  By: Nolon Nations M.D.   On: 07/28/2021 08:27   US Venous Img Lower Bilateral (DVT)  Result Date: 07/26/2021 CLINICAL DATA:  220371, positive D-dimer. EXAM: BILATERAL LOWER EXTREMITY VENOUS DOPPLER ULTRASOUND TECHNIQUE: Gray-scale sonography with graded compression, as well as color Doppler and duplex ultrasound were performed to evaluate the lower extremity deep venous systems from the level of the common femoral vein and including the common femoral, femoral, profunda femoral, popliteal and calf veins including the posterior tibial, peroneal and gastrocnemius veins when visible. The superficial great saphenous vein was also interrogated. Spectral Doppler was utilized to evaluate flow at rest and with distal  augmentation maneuvers in the common femoral, femoral and popliteal veins. COMPARISON:  None Available. FINDINGS: RIGHT LOWER EXTREMITY Common Femoral Vein: No evidence of thrombus. Normal compressibility, respiratory phasicity and response to augmentation. Saphenofemoral Junction: No evidence of thrombus. Normal compressibility and flow on color Doppler imaging. Profunda Femoral Vein: No evidence of thrombus. Normal compressibility and flow on color Doppler imaging. Femoral Vein: No evidence of thrombus. Normal compressibility, respiratory phasicity and response to augmentation. Popliteal Vein: No evidence of thrombus. Normal compressibility, respiratory phasicity and response to augmentation. Calf Veins: No evidence of thrombus. Normal compressibility and flow on color Doppler imaging. Superficial Great Saphenous Vein: No evidence of thrombus. Normal compressibility. Venous Reflux:  None. Other Findings:  None. LEFT LOWER EXTREMITY Common Femoral Vein: No evidence of thrombus. Normal compressibility, respiratory phasicity and response to augmentation. Saphenofemoral Junction: No evidence of thrombus. Normal compressibility and flow on color Doppler imaging. Profunda Femoral Vein: No evidence of thrombus. Normal compressibility and flow on color Doppler imaging. Femoral Vein: No evidence of thrombus. Normal compressibility, respiratory phasicity and response to augmentation. Popliteal Vein: No evidence of thrombus. Normal compressibility, respiratory phasicity and response to augmentation. Calf Veins: No evidence of thrombus. Normal compressibility and flow on color Doppler imaging. Superficial Great Saphenous Vein: No evidence of thrombus. Normal compressibility. Venous Reflux:  None. Other Findings:  None. IMPRESSION: No evidence of deep venous thrombosis in either lower extremity. Electronically Signed   By: Iven Finn M.D.   On: 07/26/2021 16:31   US Renal  Result Date: 07/26/2021 CLINICAL DATA:   61 year old male with acute renal failure EXAM: RENAL / URINARY TRACT ULTRASOUND COMPLETE COMPARISON:  01/30/2015 CT and prior studies. FINDINGS: Right Kidney: Renal measurements: 11.5 x 4.5 x 5.5 cm = volume: 149 mL. Echogenicity within normal limits. No suspicious mass or hydronephrosis visualized. Left Kidney: Renal measurements: 10.8 x 5.6 x 4.9 cm = volume: 160 mL. Echogenicity within normal limits. No suspicious mass or hydronephrosis visualized. Bladder: Appears normal for degree of bladder distention. Other: None. IMPRESSION: Unremarkable renal ultrasound. Electronically Signed   By: Margarette Canada M.D.   On: 07/26/2021 12:49   DG Chest Port 1 View  Result Date: 07/26/2021 CLINICAL DATA:  Weakness EXAM: PORTABLE CHEST 1 VIEW COMPARISON:  Chest x-ray dated September 28, 2014 FINDINGS: Cardiac and mediastinal contours within normal limits for AP technique. Blunting of the right costophrenic angle. Mild bibasilar opacities, likely due to atelectasis. No large pleural effusion or pneumothorax. Age indeterminate fracture of the lateral right 7th rib, new when compared with 2016 prior exam. IMPRESSION: 1. Blunting of the right costophrenic angle, possibly due to small right pleural effusion or pleural thickening. 2. Age-indeterminate fracture of the lateral right 7th rib, correlate for point tenderness. Electronically Signed   By: Yetta Glassman M.D.   On: 07/26/2021 11:22   CT Head Wo Contrast  Result  Date: 07/26/2021 CLINICAL DATA:  Multiple falls recently. Head trauma, altered mental status. EXAM: CT HEAD WITHOUT CONTRAST CT CERVICAL SPINE WITHOUT CONTRAST TECHNIQUE: Multidetector CT imaging of the head and cervical spine was performed following the standard protocol without intravenous contrast. Multiplanar CT image reconstructions of the cervical spine were also generated. RADIATION DOSE REDUCTION: This exam was performed according to the departmental dose-optimization program which includes automated  exposure control, adjustment of the mA and/or kV according to patient size and/or use of iterative reconstruction technique. COMPARISON:  Head and cervical spine CT 01/30/2015 FINDINGS: CT HEAD FINDINGS Brain: Ventricles, cisterns and other CSF spaces are normal. No mass, mass effect, shift of midline structures or acute hemorrhage. No evidence of acute infarction. Vascular: No hyperdense vessel or unexpected calcification. Skull: Normal. Negative for fracture or focal lesion. Sinuses/Orbits: Hypoplastic frontal sinuses. Remaining paranasal sinuses are clear. Orbits are normal. Other: None. CT CERVICAL SPINE FINDINGS Alignment: Normal. Skull base and vertebrae: Vertebral body heights are maintained. There is moderate spondylosis of the cervical spine to include uncovertebral joint spurring and facet arthropathy. Atlantoaxial articulation is unremarkable. No acute fracture. Significant right-sided neural foraminal narrowing at the C3-4 level with bilateral neural foraminal narrowing at the C4-5, C5-6 and C6-7 levels. Soft tissues and spinal canal: Prevertebral soft tissues are normal. Minimal canal stenosis at the C4-5 level. Disc levels:  Disc space narrowing from the C4-5 to the C6-7 levels. Upper chest: No acute findings. Other: None. IMPRESSION: 1. No acute brain injury. 2. No acute cervical spine injury. 3. 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 Minimal canal stenosis at the C4-5 level. Electronically Signed   By: Marin Olp M.D.   On: 07/26/2021 10:29   CT Cervical Spine Wo Contrast  Result Date: 07/26/2021 CLINICAL DATA:  Multiple falls recently. Head trauma, altered mental status. EXAM: CT HEAD WITHOUT CONTRAST CT CERVICAL SPINE WITHOUT CONTRAST TECHNIQUE: Multidetector CT imaging of the head and cervical spine was performed following the standard protocol without intravenous contrast. Multiplanar CT image  reconstructions of the cervical spine were also generated. RADIATION DOSE REDUCTION: This exam was performed according to the departmental dose-optimization program which includes automated exposure control, adjustment of the mA and/or kV according to patient size and/or use of iterative reconstruction technique. COMPARISON:  Head and cervical spine CT 01/30/2015 FINDINGS: CT HEAD FINDINGS Brain: Ventricles, cisterns and other CSF spaces are normal. No mass, mass effect, shift of midline structures or acute hemorrhage. No evidence of acute infarction. Vascular: No hyperdense vessel or unexpected calcification. Skull: Normal. Negative for fracture or focal lesion. Sinuses/Orbits: Hypoplastic frontal sinuses. Remaining paranasal sinuses are clear. Orbits are normal. Other: None. CT CERVICAL SPINE FINDINGS Alignment: Normal. Skull base and vertebrae: Vertebral body heights are maintained. There is moderate spondylosis of the cervical spine to include uncovertebral joint spurring and facet arthropathy. Atlantoaxial articulation is unremarkable. No acute fracture. Significant right-sided neural foraminal narrowing at the C3-4 level with bilateral neural foraminal narrowing at the C4-5, C5-6 and C6-7 levels. Soft tissues and spinal canal: Prevertebral soft tissues are normal. Minimal canal stenosis at the C4-5 level. Disc levels:  Disc space narrowing from the C4-5 to the C6-7 levels. Upper chest: No acute findings. Other: None. IMPRESSION: 1. No acute brain injury. 2. No acute cervical spine injury. 3. 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 Minimal canal stenosis at the C4-5 level. Electronically Signed  By: Marin Olp M.D.   On: 07/26/2021 10:29   DG Foot Complete Right  Result Date: 07/26/2021 CLINICAL DATA:  61 year old male with acute foot pain. EXAM: RIGHT FOOT COMPLETE - 3 VIEW COMPARISON:  None Available. FINDINGS:  There is no evidence of acute fracture, subluxation or dislocation. The Lisfranc joints are unremarkable. A small plantar calcaneal spur is present. IMPRESSION: Small plantar calcaneal spur. Electronically Signed   By: Margarette Canada M.D.   On: 07/26/2021 10:19   DG Hip Unilat With Pelvis 2-3 Views Right  Result Date: 07/26/2021 CLINICAL DATA:  Fall with right hip pain. EXAM: DG HIP (WITH OR WITHOUT PELVIS) 2-3V RIGHT COMPARISON:  None Available. FINDINGS: Examination demonstrates minimal symmetric degenerative change of the hips. No evidence of acute fracture or dislocation. IMPRESSION: No acute findings. Electronically Signed   By: Marin Olp M.D.   On: 07/26/2021 10:17    Microbiology: Results for orders placed or performed during the hospital encounter of 07/26/21  SARS Coronavirus 2 by RT PCR (hospital order, performed in [facility] hospital lab) *cepheid single result test* Anterior Nasal Swab     Status: None   Collection Time: 07/26/21  1:15 PM   Specimen: Anterior Nasal Swab  Result Value Ref Range Status   SARS Coronavirus 2 by RT PCR NEGATIVE NEGATIVE Final    Comment: (NOTE) SARS-CoV-2 target nucleic acids are NOT DETECTED.  The SARS-CoV-2 RNA is generally detectable in upper and lower respiratory specimens during the acute phase of infection. The lowest concentration of SARS-CoV-2 viral copies this assay can detect is 250 copies / mL. A negative result does not preclude SARS-CoV-2 infection and should not be used as the sole basis for treatment or other patient management decisions.  A negative result may occur with improper specimen collection / handling, submission of specimen other than nasopharyngeal swab, presence of viral mutation(s) within the areas targeted by this assay, and inadequate number of viral copies (<250 copies / mL). A negative result must be combined with clinical observations, patient history, and epidemiological information.  Fact Sheet for Patients:    https://www.patel.info/  Fact Sheet for Healthcare Providers: https://hall.com/  This test is not yet approved or  cleared by the [place] FDA and has been authorized for detection and/or diagnosis of SARS-CoV-2 by FDA under an Emergency Use Authorization (EUA).  This EUA will remain in effect (meaning this test can be used) for the duration of the COVID-19 declaration under Section 564(b)(1) of the Act, 21 U.S.C. section 360bbb-3(b)(1), unless the authorization is terminated or revoked sooner.  Performed at [facility], [address] [place], [place] [address]   Culture, blood (Routine X 2) w Reflex to ID Panel     Status: None (Preliminary result)   Collection Time: 07/26/21  7:09 PM   Specimen: BLOOD  Result Value Ref Range Status   Specimen Description BLOOD RIGHT [facility]  Final   Special Requests   Final    BOTTLES DRAWN AEROBIC AND ANAEROBIC Blood Culture results may not be optimal due to an excessive volume of blood received in culture bottles   Culture   Final    NO GROWTH < 12 HOURS Performed at [facility], [address] [place], [place] [address]    Report Status PENDING  Incomplete  Culture, blood (Routine X 2) w Reflex to ID Panel     Status: None (Preliminary result)   Collection Time: 07/26/21  7:14 PM   Specimen: BLOOD  Result  Value Ref Range Status   Specimen Description BLOOD RIGHT HAND  Final   Special Requests   Final    BOTTLES DRAWN AEROBIC AND ANAEROBIC Blood Culture results may not be optimal due to an excessive volume of blood received in culture bottles   Culture   Final    NO GROWTH < 12 HOURS Performed at [facility], [address] [place], [place] [address]    Report Status PENDING  Incomplete    Labs: CBC: Recent Labs  Lab 07/26/21 0958 07/27/21 0456 07/28/21 0427  WBC 12.5* 7.0 7.2  NEUTROABS 9.3*  --   --   HGB 14.3 11.7* 11.9*  HCT 43.4 35.3* 35.0*   MCV 88.6 87.2 86.4  PLT 354 261 128   Basic Metabolic Panel: Recent Labs  Lab 07/26/21 0958 07/27/21 0456 07/28/21 0427  NA 139 139 138  K 3.3* 3.6 3.7  CL 99 109 109  CO2 '26 25 24  '$ GLUCOSE 140* 110* 102*  BUN 35* 34* 21*  CREATININE 5.44* 3.28* 1.22  CALCIUM 9.7 8.3* 8.4*  MG 2.4  --  1.9   Liver Function Tests: Recent Labs  Lab 07/26/21 0958  AST 19  ALT 13  ALKPHOS 74  BILITOT 0.8  PROT 7.3  ALBUMIN 4.3   CBG: No results for input(s): "GLUCAP" in the last 168 hours.  Discharge time spent: greater than 30 minutes.  Signed: Sharen Hones, MD [facility] 07/28/2021

## 2021-07-28 NOTE — ED Provider Notes (Signed)
I was present with Dr. Tamala Julian for chaperone purposes.  Patient was here to have his hemorrhoid evaluated.  Patient states that this was banded 4 days ago.  While Dr. Tamala Julian was evaluating the patient, patient verbalizes that he had cut his Foley catheter off.  Dr. Tamala Julian was asking patient questions to ensure that the Foley was appropriately removed and not just cut with residual Foley catheter remaining in the patient's urethra and bladder.  Patient became very irritated, states that "they" had removed it after he had cut it.  However it appears that patient cut it at home and unsure who "they" are.  When attempting to clarify, patient became very upset.  He advised that he never wanted a Foley catheter and he states that he had told nursing staff while admitted recently that he would not keep the Foley cath and as soon as he went home he used a pair scissors to cut through the Foley catheter.  Unsure whether patient had any residual Foley catheter in place after he cut it at home.  When discussing potential risks of the patient removing his Foley catheter like this at home, patient became very upset, stated that "if you are not going to do something for my hemorrhoid I am leaving."  Patient pulled up his pants, got off the bed and left the ED directly.  Patient was advised as he was leaving by Dr. Tamala Julian that he could return at any time, that we were attempting to help him by asking him questions and that we would like for him to stay. Patient left the ED AMA   Guerry Minors Jarome Lamas 07/28/21 2116    Lucrezia Starch, MD 07/28/21 2241

## 2021-07-28 NOTE — Progress Notes (Signed)
Within 2-3 minutes of receiving early percocet dose, patient exited room and begin walking down hallway without pants on, carrying foley catheter in hand. When asked where he was going, patient replied "I'm leaving." Patient reminded he told attending MD that he would wait for discharge paperwork if he was given his pain medication to which patient replied, "I got what I wanted." Patient informed he could not be ambulating in the hallway exposing himself, to which patient bent over and pulled his butt cheeks apart." Nurse continued to redirect patient back to room. Security called. Once in room, patient assisted to get dressed, empty catheter bag and review catheter care procedures, although patient minimally attentive. Discharge instructions reviewed with patient, including need for follow up with urology. Patient educated repeatedly on need to keep foley catheter in place until directed otherwise by urology. Patient ambulated off unit with security present after receiving discharge paperwork.

## 2021-07-28 NOTE — Progress Notes (Signed)
OT Cancellation Note  Patient Details Name: Alvin Gonzalez. MRN: [id number] DOB: 12-17-1960   Cancelled Treatment:    Reason Eval/Treat Not Completed: Patient not medically ready. Order received, chart reviewed. Pt is scheduled for a pulmonary ventilation and perfusion scan later today. Will see pt for OT evaluation following VQ scan and pt being cleared for PE.   Josiah Lobo 07/28/2021, 8:24 AM

## 2021-07-28 NOTE — Progress Notes (Signed)
[facility]  ROUNDING NOTE   Subjective:   Patient seen sitting up in bed, nursing student at bedside Alert and oriented Tolerating meals without nausea and vomiting Room air Asking questions regarding being discharged with Foley catheter.  Creatinine 1.22   Objective:  Vital signs in last 24 hours:  Temp:  [97.7 F (36.5 C)-99.1 F (37.3 C)] 99.1 F (37.3 C) (06/19 1157) Pulse Rate:  [82-96] 91 (06/19 1157) Resp:  [14-18] 14 (06/19 1157) BP: (97-115)/(60-70) 102/67 (06/19 1157) SpO2:  [89 %-97 %] 95 % (06/19 1157) Weight:  [72.9 kg] 72.9 kg (06/19 0500)  Weight change: 4.861 kg Filed Weights   07/26/21 2000 07/27/21 0500 07/28/21 0500  Weight: 72.1 kg 73.3 kg 72.9 kg    Intake/Output: I/O last 3 completed shifts: In: 5401.7 [P.O.:1440; I.V.:3961.7] Out: 2775 [Urine:2775]   Intake/Output this shift:  Total I/O In: -  Out: 930 [Urine:930]  Physical Exam: General: NAD, resting comfortably  Head: Normocephalic, atraumatic. Moist oral mucosal membranes  Eyes: Anicteric  Lungs:  Clear to auscultation normal effort  Heart: Regular rate and rhythm  Abdomen:  Soft, nontender  Extremities: No peripheral edema.  Neurologic: Nonfocal, moving all four extremities  Skin: No lesions  Access: None    Basic Metabolic Panel: Recent Labs  Lab 07/26/21 0958 07/27/21 0456 07/28/21 0427  NA 139 139 138  K 3.3* 3.6 3.7  CL 99 109 109  CO2 '26 25 24  '$ GLUCOSE 140* 110* 102*  BUN 35* 34* 21*  CREATININE 5.44* 3.28* 1.22  CALCIUM 9.7 8.3* 8.4*  MG 2.4  --  1.9    Liver Function Tests: Recent Labs  Lab 07/26/21 0958  AST 19  ALT 13  ALKPHOS 74  BILITOT 0.8  PROT 7.3  ALBUMIN 4.3   No results for input(s): "LIPASE", "AMYLASE" in the last 168 hours. No results for input(s): "AMMONIA" in the last 168 hours.  CBC: Recent Labs  Lab 07/26/21 0958 07/27/21 0456 07/28/21 0427  WBC 12.5* 7.0 7.2  NEUTROABS 9.3*  --   --   HGB 14.3 11.7* 11.9*   HCT 43.4 35.3* 35.0*  MCV 88.6 87.2 86.4  PLT 354 261 256    Cardiac Enzymes: No results for input(s): "CKTOTAL", "CKMB", "CKMBINDEX", "TROPONINI" in the last 168 hours.  BNP: Invalid input(s): "POCBNP"  CBG: No results for input(s): "GLUCAP" in the last 168 hours.  Microbiology: Results for orders placed or performed during the hospital encounter of 07/26/21  SARS Coronavirus 2 by RT PCR (hospital order, performed in [facility] hospital lab) *cepheid single result test* Anterior Nasal Swab     Status: None   Collection Time: 07/26/21  1:15 PM   Specimen: Anterior Nasal Swab  Result Value Ref Range Status   SARS Coronavirus 2 by RT PCR NEGATIVE NEGATIVE Final    Comment: (NOTE) SARS-CoV-2 target nucleic acids are NOT DETECTED.  The SARS-CoV-2 RNA is generally detectable in upper and lower respiratory specimens during the acute phase of infection. The lowest concentration of SARS-CoV-2 viral copies this assay can detect is 250 copies / mL. A negative result does not preclude SARS-CoV-2 infection and should not be used as the sole basis for treatment or other patient management decisions.  A negative result may occur with improper specimen collection / handling, submission of specimen other than nasopharyngeal swab, presence of viral mutation(s) within the areas targeted by this assay, and inadequate number of viral copies (<250 copies / mL). A negative result must be  combined with clinical observations, patient history, and epidemiological information.  Fact Sheet for Patients:   https://www.patel.info/  Fact Sheet for Healthcare Providers: https://hall.com/  This test is not yet approved or  cleared by the [place] FDA and has been authorized for detection and/or diagnosis of SARS-CoV-2 by FDA under an Emergency Use Authorization (EUA).  This EUA will remain in effect (meaning this test can be used) for the duration of  the COVID-19 declaration under Section 564(b)(1) of the Act, 21 U.S.C. section 360bbb-3(b)(1), unless the authorization is terminated or revoked sooner.  Performed at [facility], [address] [place], [place] [address]   Culture, blood (Routine X 2) w Reflex to ID Panel     Status: None (Preliminary result)   Collection Time: 07/26/21  7:09 PM   Specimen: BLOOD  Result Value Ref Range Status   Specimen Description BLOOD RIGHT [facility]  Final   Special Requests   Final    BOTTLES DRAWN AEROBIC AND ANAEROBIC Blood Culture results may not be optimal due to an excessive volume of blood received in culture bottles   Culture   Final    NO GROWTH < 12 HOURS Performed at [facility], [address], [place], [place] [address]    Report Status PENDING  Incomplete  Culture, blood (Routine X 2) w Reflex to ID Panel     Status: None (Preliminary result)   Collection Time: 07/26/21  7:14 PM   Specimen: BLOOD  Result Value Ref Range Status   Specimen Description BLOOD RIGHT HAND  Final   Special Requests   Final    BOTTLES DRAWN AEROBIC AND ANAEROBIC Blood Culture results may not be optimal due to an excessive volume of blood received in culture bottles   Culture   Final    NO GROWTH < 12 HOURS Performed at [facility], [address] [place], [place] [address]    Report Status PENDING  Incomplete    Coagulation Studies: No results for input(s): "LABPROT", "INR" in the last 72 hours.  Urinalysis: Recent Labs    07/26/21 2237  COLORURINE YELLOW*  LABSPEC 1.012  PHURINE 5.0  GLUCOSEU 50*  HGBUR NEGATIVE  BILIRUBINUR NEGATIVE  KETONESUR NEGATIVE  PROTEINUR 30*  NITRITE NEGATIVE  LEUKOCYTESUR NEGATIVE      Imaging: NM Pulmonary Perfusion  Result Date: 07/28/2021 CLINICAL DATA:  Shortness of breath. EXAM: NUCLEAR MEDICINE PERFUSION LUNG SCAN TECHNIQUE: Perfusion images were obtained in multiple projections after intravenous injection of  radiopharmaceutical. Ventilation scans intentionally deferred if perfusion scan and chest x-ray adequate for interpretation during COVID 19 epidemic. RADIOPHARMACEUTICALS:  4.2 mCi Tc-49mMAA IV COMPARISON:  Chest x-ray 07/28/2021 FINDINGS: Multiplanar perfusion imaging shows no peripheral segmental perfusion defect in either lung. There is heterogeneous decreased perfusion in the upper lungs bilaterally. Perfusion defect at the right base is compatible the collapse/consolidation and effusion seen on x-ray. IMPRESSION: Perfusion study negative for acute pulmonary embolus. Electronically Signed   By: EMisty StanleyM.D.   On: 07/28/2021 12:35   DG Chest 2 View  Result Date: 07/28/2021 CLINICAL DATA:  Shortness of breath and weakness. EXAM: CHEST - 2 VIEW COMPARISON:  09/28/2014 FINDINGS: Lungs are hyperinflated. Heart size is normal. There is new RIGHT LOWER lobe consolidation and pleural effusion. Small LEFT pleural effusion and associated atelectasis. No pulmonary edema. Mild wedge compression fractures in the LOWER thoracic levels, at least involving T9, T10, T11, and T12. IMPRESSION: New bilateral pleural effusions, RIGHT LOWER lobe consolidation and  LEFT LOWER lobe atelectasis. Chronic thoracic compression fractures. Electronically Signed   By: Nolon Nations M.D.   On: 07/28/2021 08:27   US Venous Img Lower Bilateral (DVT)  Result Date: 07/26/2021 CLINICAL DATA:  220371, positive D-dimer. EXAM: BILATERAL LOWER EXTREMITY VENOUS DOPPLER ULTRASOUND TECHNIQUE: Gray-scale sonography with graded compression, as well as color Doppler and duplex ultrasound were performed to evaluate the lower extremity deep venous systems from the level of the common femoral vein and including the common femoral, femoral, profunda femoral, popliteal and calf veins including the posterior tibial, peroneal and gastrocnemius veins when visible. The superficial great saphenous vein was also interrogated. Spectral Doppler was  utilized to evaluate flow at rest and with distal augmentation maneuvers in the common femoral, femoral and popliteal veins. COMPARISON:  None Available. FINDINGS: RIGHT LOWER EXTREMITY Common Femoral Vein: No evidence of thrombus. Normal compressibility, respiratory phasicity and response to augmentation. Saphenofemoral Junction: No evidence of thrombus. Normal compressibility and flow on color Doppler imaging. Profunda Femoral Vein: No evidence of thrombus. Normal compressibility and flow on color Doppler imaging. Femoral Vein: No evidence of thrombus. Normal compressibility, respiratory phasicity and response to augmentation. Popliteal Vein: No evidence of thrombus. Normal compressibility, respiratory phasicity and response to augmentation. Calf Veins: No evidence of thrombus. Normal compressibility and flow on color Doppler imaging. Superficial Great Saphenous Vein: No evidence of thrombus. Normal compressibility. Venous Reflux:  None. Other Findings:  None. LEFT LOWER EXTREMITY Common Femoral Vein: No evidence of thrombus. Normal compressibility, respiratory phasicity and response to augmentation. Saphenofemoral Junction: No evidence of thrombus. Normal compressibility and flow on color Doppler imaging. Profunda Femoral Vein: No evidence of thrombus. Normal compressibility and flow on color Doppler imaging. Femoral Vein: No evidence of thrombus. Normal compressibility, respiratory phasicity and response to augmentation. Popliteal Vein: No evidence of thrombus. Normal compressibility, respiratory phasicity and response to augmentation. Calf Veins: No evidence of thrombus. Normal compressibility and flow on color Doppler imaging. Superficial Great Saphenous Vein: No evidence of thrombus. Normal compressibility. Venous Reflux:  None. Other Findings:  None. IMPRESSION: No evidence of deep venous thrombosis in either lower extremity. Electronically Signed   By: Iven Finn M.D.   On: 07/26/2021 16:31      Medications:     amitriptyline  25 mg Oral QHS   aspirin  81 mg Oral Daily   Chlorhexidine Gluconate Cloth  6 each Topical Daily   heparin  5,000 Units Subcutaneous Q8H   nicotine  21 mg Transdermal Daily   pantoprazole  40 mg Oral Daily   predniSONE  2.5 mg Oral BID WC   QUEtiapine  12.5 mg Oral BID   senna-docusate  2 tablet Oral BID   tamsulosin  0.4 mg Oral Daily   acetaminophen, albuterol, alum & mag hydroxide-simeth, dextromethorphan-guaiFENesin, hydrOXYzine, lactulose, ondansetron (ZOFRAN) IV, oxyCODONE-acetaminophen, sodium chloride  Assessment/ Plan:  [name]. is a 61 y.o.  male with medical problems of hypertension, GERD, depression, anxiety, chronic pain, tobacco abuse, history of suicidal ideation  was admitted on 07/26/2021 for Dehydration [E86.0] AKI (acute kidney injury) ([place]) [N17.9] Fall, initial encounter [W19.XXXA]   Acute kidney injury with proteinuria with baseline creatinine 0.91 on 12/22.  Urinalysis-glucosuria, mild proteinuria, 0-5 RBCs, 0-5 WBCs Imaging: Renal ultrasound 07/26/2021-unremarkable. AKI likely secondary to hypotension.  He is responding well to fluid resuscitation. Home medication regimen of amlodipine, hydrochlorothiazide, lisinopril. Agree with holding all antihypertensives at present. Patient reports some difficulty with urination.  Empiric trial of Flomax.   Will discharge  with foley catheter and follow up with urology at discharge.   Renal function within normal limits at this time.    Lab Results  Component Value Date   CREATININE 1.22 07/28/2021   CREATININE 3.28 (H) 07/27/2021   CREATININE 5.44 (H) 07/26/2021    Intake/Output Summary (Last 24 hours) at 07/28/2021 1305 Last data filed at 07/28/2021 0906 Gross per 24 hour  Intake 2749.35 ml  Output 2955 ml  Net -205.65 ml   We will sign off at this time. Feel free to contact us with any concerns and questions.     LOS: 2 [place] 6/19/20231:05  PM

## 2021-07-28 NOTE — Progress Notes (Signed)
Patient agitated and verbally aggressive towards staff after returning from nuc med scan. Patient demanding to have "pain medication." Patient educated next available dose would be in an hour. Patient states he is "getting the f*ck out of here" and begins to pull out PIV and tele off. Patient educated on plan of care and that MD is planning to d/c him today if his Nuc Med scan is clear. Patient replied, "I don't give a f*ck, I'm leaving!" Patient educated on AMA process and need for follow up and medications that would be missed if he left AMA. Patient continues to get more agitated and violent in behavior. Patient then states, "If you don't shut the f*ck up I'm going to punch you." Security called. AMA paper signed. MD made aware. Upon MD arrival at bedside, patient requesting to have pain medication. MD agreeable to patient receiving pain medication early in order for him to wait to be properly discharged. Patient also requesting pain medication for home use. Patient given percocet early, per MD request. MD to discharge patient.

## 2021-07-28 NOTE — ED Notes (Signed)
Registration went to room to find this pt gone, Per PA Cuthriell, pt did not want to stay and stormed out of ED. Pt is an AMA, not seen by this RN

## 2021-07-28 NOTE — ED Provider Notes (Signed)
[facility] Provider Note    Event Date/Time   First MD Initiated Contact with Patient 07/28/21 2058     (approximate)   History   Hemorrhoids and Constipation   HPI  Alvin Gonzalez. is a 61 y.o. male  with medical history significant of external hemorrhoid most recently banded in outpatient GI clinic on 6/15 as well as HTN, GERD, depression, anxiety, chronic pain, tobacco abuse, history of suicidal ideation and recent hospitalization for management of acute hypoxic respiratory failure secondary to COPD exacerbation and acute renal failure thought to be secondary to combination of dehydration from diarrhea and acute urinary retention discharged with a Foley earlier today after creatinine had normalized who presents for evaluation of constipation and some ongoing pain in his rectal area.  Patient also states after he got home from being in the hospital today he cut off his Foley bag because he thought it was contributing to his constipation.  Seems has not been taking anything for this.  While undergoing initial interview patient became fairly frustrated on further questioning with regards to him cutting his Foley tubing when he got home from the hospital today.  He states that healthcare provider "pulled the rest of it out" although I cannot find any record of this and there is no record of this in triage.        Physical Exam  Triage Vital Signs: ED Triage Vitals  Enc Vitals Group     BP 07/28/21 2006 119/72     Pulse Rate 07/28/21 2006 (!) 107     Resp 07/28/21 2006 16     Temp 07/28/21 2006 97.8 F (36.6 C)     Temp Source 07/28/21 2006 Oral     SpO2 07/28/21 2006 97 %     Weight 07/28/21 2011 145 lb (65.8 kg)     Height 07/28/21 2011 '5\' 5"'$  (1.651 m)     Head Circumference --      Peak Flow --      Pain Score 07/28/21 2011 10     Pain Loc --      Pain Edu? --      Excl. in [place]? --     Most recent vital signs: Vitals:   07/28/21 2006  BP:  119/72  Pulse: (!) 107  Resp: 16  Temp: 97.8 F (36.6 C)  SpO2: 97%    General: Awake, no distress.  CV:  Good peripheral perfusion.  Resp:  Normal effort.  Abd:  No distention.  Soft. Other:  Fairly large what appears to be thrombosed and recently banded external hemorrhoid.  Foley tubing is visible at the penile meatus.    ED Results / Procedures / Treatments  Labs (all labs ordered are listed, but only abnormal results are displayed) Labs Reviewed  URINALYSIS, ROUTINE W REFLEX MICROSCOPIC - Abnormal; Notable for the following components:      Result Value   Color, Urine YELLOW (*)    APPearance HAZY (*)    Glucose, UA 50 (*)    Hgb urine dipstick LARGE (*)    Protein, ur 100 (*)    RBC / HPF >50 (*)    WBC, UA >50 (*)    Bacteria, UA RARE (*)    All other components within normal limits  URINE CULTURE     EKG    RADIOLOGY    PROCEDURES:  Critical Care performed: No  Procedures    MEDICATIONS ORDERED IN ED: Medications - No  data to display   IMPRESSION / MDM / [place] / ED COURSE  I reviewed the triage vital signs and the nursing notes. Patient's presentation is most consistent with acute presentation with potential threat to life or bodily function.                               Differential diagnosis includes, but is not limited to thrombosed external hemorrhoid, rectal fissure, abscess, opioid-induced constipation it seems patient has been on recent opioids as well as concern for possible retained Foley components in patient's bladder urethra given he reports he caught it after leaving the hospital from the Foley bag.  On initial interview which was chaperoned and during initial rectal exam patient became quite irritated with this examiner and this examiner was trying to understand what happened to the proximal piece of the Foley.  He stated "well if not can to do anything about the hemorrhoid I am leaving".  Repeatedly encouraged him  to stay so we could discuss any additional symptoms and diagnostic studies which I think would likely include a CT of the abdomen pelvis to assess for recurrent retention, retained Foley components and possible rectal abscess but patient walked out of the ER on his own power prior to me being able to complete this conversation.  He did not seem intoxicated or psychotic.  I think you have capacity to make this decision to refuse any additional care or evaluation at this time.  Discharged AMA.        FINAL CLINICAL IMPRESSION(S) / ED DIAGNOSES   Final diagnoses:  Hemorrhoids, unspecified hemorrhoid type     Rx / DC Orders   ED Discharge Orders     None        Note:  This document was prepared using Dragon voice recognition software and may include unintentional dictation errors.   Lucrezia Starch, MD 07/28/21 2114

## 2021-07-29 DIAGNOSIS — M5000 Cervical disc disorder with myelopathy, unspecified cervical region: Secondary | ICD-10-CM

## 2021-07-29 HISTORY — DX: Cervical disc disorder with myelopathy, unspecified cervical region: M50.00

## 2021-07-30 ENCOUNTER — Encounter: Payer: Self-pay | Admitting: Gerontology

## 2021-07-30 ENCOUNTER — Other Ambulatory Visit: Payer: Self-pay

## 2021-07-30 ENCOUNTER — Ambulatory Visit: Payer: Medicaid Other | Admitting: Gerontology

## 2021-07-30 VITALS — BP 119/68 | HR 90 | Temp 98.0°F | Resp 17 | Ht 65.0 in | Wt 152.0 lb

## 2021-07-30 DIAGNOSIS — Z09 Encounter for follow-up examination after completed treatment for conditions other than malignant neoplasm: Secondary | ICD-10-CM | POA: Insufficient documentation

## 2021-07-30 DIAGNOSIS — R8290 Unspecified abnormal findings in urine: Secondary | ICD-10-CM

## 2021-07-30 DIAGNOSIS — K649 Unspecified hemorrhoids: Secondary | ICD-10-CM | POA: Insufficient documentation

## 2021-07-30 LAB — URINE CULTURE: Culture: NO GROWTH

## 2021-07-30 NOTE — Progress Notes (Signed)
Established Patient Office Visit  Subjective   Patient ID: Alvin Hoose., male    DOB: 09/18/1960  Age: 61 y.o. MRN: [id number]  Chief Complaint  Patient presents with   Follow-up    HPI  Alvin Gonzalez. Is a 61 y/o male who has history of Anxiety, Depression,Hypertension, Chronic back pain presents for routine follow up. He was discharge from the hospital on 07/28/2021.  During his hospital course, he was treated for acute kidney injury, hypotension.  He was discharged with indwelling Foley catheter but he left AMA, ended up cutting the catheter with a scissors.  Currently, he states that he is voiding well, but his urine is not clear.  He denies dysuria, urinary frequency, urgency no flank pain.  He was seen at the ED on 07/28/2020 evening for hemorrhoid and constipation but he left AMA without being seen.  Currently, he states that he pushed back to hemorrhoid this morning, denies pain blood.  He states that he is having regular soft bowel movement , and will follow-up with gastroenterologist Dr Bailey Mech A. He reports that the Amytriptyline has not being effective.  Overall, he states that he is doing well and offers no further complaints.    Review of Systems  Constitutional: Negative.   Respiratory: Negative.    Cardiovascular: Negative.   Genitourinary:  Positive for hematuria. Negative for dysuria, flank pain, frequency and urgency.  Neurological: Negative.       Objective:     BP 119/68 (BP Location: Right Arm, Patient Position: Sitting, Cuff Size: Normal)   Pulse 90   Temp 98 F (36.7 C) (Oral)   Resp 17   Ht '5\' 5"'$  (1.651 m)   Wt 152 lb (68.9 kg)   SpO2 98%   BMI 25.29 kg/m  BP Readings from Last 3 Encounters:  07/30/21 119/68  07/28/21 119/72  07/28/21 102/67   Wt Readings from Last 3 Encounters:  07/30/21 152 lb (68.9 kg)  07/28/21 145 lb (65.8 kg)  07/28/21 160 lb 11.5 oz (72.9 kg)      Physical Exam HENT:     Head: Normocephalic and  atraumatic.  Cardiovascular:     Rate and Rhythm: Normal rate and regular rhythm.     Pulses: Normal pulses.     Heart sounds: Normal heart sounds.  Pulmonary:     Effort: Pulmonary effort is normal.     Breath sounds: Normal breath sounds.  Abdominal:     General: Bowel sounds are normal.     Palpations: Abdomen is soft.  Skin:    General: Skin is warm.  Neurological:     General: No focal deficit present.     Mental Status: He is alert and oriented to person, place, and time. Mental status is at baseline.  Psychiatric:        Mood and Affect: Mood normal.        Behavior: Behavior normal.        Thought Content: Thought content normal.        Judgment: Judgment normal.      No results found for any visits on 07/30/21.  Last CBC Lab Results  Component Value Date   WBC 7.2 07/28/2021   HGB 11.9 (L) 07/28/2021   HCT 35.0 (L) 07/28/2021   MCV 86.4 07/28/2021   MCH 29.4 07/28/2021   RDW 13.1 07/28/2021   PLT 256 79/39/0300   Last metabolic panel Lab Results  Component Value Date   GLUCOSE 102 (H)  07/28/2021   NA 138 07/28/2021   K 3.7 07/28/2021   CL 109 07/28/2021   CO2 24 07/28/2021   BUN 21 (H) 07/28/2021   CREATININE 1.22 07/28/2021   GFRNONAA >60 07/28/2021   CALCIUM 8.4 (L) 07/28/2021   PHOS 3.4 02/18/2014   PROT 7.3 07/26/2021   ALBUMIN 4.3 07/26/2021   LABGLOB 2.5 01/23/2021   AGRATIO 1.8 01/23/2021   BILITOT 0.8 07/26/2021   ALKPHOS 74 07/26/2021   AST 19 07/26/2021   ALT 13 07/26/2021   ANIONGAP 5 07/28/2021   Last lipids Lab Results  Component Value Date   CHOL 135 01/23/2021   HDL 28 (L) 01/23/2021   LDLCALC 72 01/23/2021   TRIG 212 (H) 01/23/2021   CHOLHDL 4.8 01/23/2021   Last hemoglobin A1c Lab Results  Component Value Date   HGBA1C 5.9 (H) 01/23/2021      The 10-year ASCVD risk score (Arnett DK, et al., 2019) is: 12.2%    Assessment & Plan:   1. Abnormal urinalysis -Urinalysis done on 07/28/21 was positive for blood,  protein and WBC.  We will recheck urinalysis and if positive will refer to urologist. - Urinalysis; Future - Urinalysis  2. Hospital discharge follow-up -He was advised to follow-up hospital discharge instructions.  3. Hemorrhoids, unspecified hemorrhoid type -He was advised to call and schedule an appointment with Gastroenterologist Dr. Bailey Mech for follow-up on his hemorrhoid.  He was advised to go to the emergency room for worsening symptoms.   Return in about 2 weeks (around 08/13/2021), or if symptoms worsen or fail to improve.    Isa Kohlenberg Jerold Coombe, NP

## 2021-07-31 LAB — CULTURE, BLOOD (ROUTINE X 2)
Culture: NO GROWTH
Culture: NO GROWTH

## 2021-07-31 LAB — URINALYSIS
Bilirubin, UA: NEGATIVE
Ketones, UA: NEGATIVE
Nitrite, UA: NEGATIVE
Specific Gravity, UA: 1.018 (ref 1.005–1.030)
Urobilinogen, Ur: 0.2 mg/dL (ref 0.2–1.0)
pH, UA: 7 (ref 5.0–7.5)

## 2021-08-01 ENCOUNTER — Other Ambulatory Visit: Payer: Self-pay

## 2021-08-07 ENCOUNTER — Other Ambulatory Visit: Payer: Self-pay

## 2021-08-11 ENCOUNTER — Other Ambulatory Visit: Payer: Self-pay | Admitting: Gerontology

## 2021-08-11 ENCOUNTER — Other Ambulatory Visit: Payer: Self-pay

## 2021-08-11 DIAGNOSIS — K219 Gastro-esophageal reflux disease without esophagitis: Secondary | ICD-10-CM

## 2021-08-11 MED ORDER — PANTOPRAZOLE SODIUM 40 MG PO TBEC
40.0000 mg | DELAYED_RELEASE_TABLET | Freq: Every day | ORAL | 2 refills | Status: DC
  Filled 2021-08-11: qty 90, 90d supply, fill #0

## 2021-08-13 ENCOUNTER — Ambulatory Visit
Admission: RE | Admit: 2021-08-13 | Discharge: 2021-08-13 | Disposition: A | Discharge status: Home / Self Care | Payer: Medicaid Other | Attending: Gerontology | Admitting: Gerontology

## 2021-08-13 ENCOUNTER — Encounter: Payer: Self-pay | Admitting: Gerontology

## 2021-08-13 ENCOUNTER — Ambulatory Visit
Admission: RE | Admit: 2021-08-13 | Discharge: 2021-08-13 | Disposition: A | Discharge status: Home / Self Care | Payer: Medicaid Other | Source: Ambulatory Visit | Attending: Gerontology | Admitting: Gerontology

## 2021-08-13 ENCOUNTER — Other Ambulatory Visit: Payer: Self-pay

## 2021-08-13 ENCOUNTER — Ambulatory Visit: Payer: Medicaid Other | Admitting: Gerontology

## 2021-08-13 VITALS — BP 146/90 | HR 85 | Temp 97.9°F | Ht 65.0 in | Wt 150.7 lb

## 2021-08-13 DIAGNOSIS — R9389 Abnormal findings on diagnostic imaging of other specified body structures: Secondary | ICD-10-CM | POA: Insufficient documentation

## 2021-08-13 DIAGNOSIS — I10 Essential (primary) hypertension: Secondary | ICD-10-CM

## 2021-08-13 DIAGNOSIS — K219 Gastro-esophageal reflux disease without esophagitis: Secondary | ICD-10-CM

## 2021-08-13 DIAGNOSIS — K649 Unspecified hemorrhoids: Secondary | ICD-10-CM

## 2021-08-13 DIAGNOSIS — R3129 Other microscopic hematuria: Secondary | ICD-10-CM

## 2021-08-13 DIAGNOSIS — R319 Hematuria, unspecified: Secondary | ICD-10-CM | POA: Insufficient documentation

## 2021-08-13 MED ORDER — LISINOPRIL 30 MG PO TABS
30.0000 mg | ORAL_TABLET | Freq: Every day | ORAL | 0 refills | Status: DC
  Filled 2021-08-13: qty 30, 30d supply, fill #0

## 2021-08-13 MED ORDER — PANTOPRAZOLE SODIUM 40 MG PO TBEC
40.0000 mg | DELAYED_RELEASE_TABLET | Freq: Every day | ORAL | 2 refills | Status: DC
  Filled 2021-08-13: qty 90, 90d supply, fill #0
  Filled 2021-08-19 – 2021-08-20 (×2): qty 30, 30d supply, fill #0
  Filled 2021-09-08: qty 30, 30d supply, fill #1
  Filled 2021-10-08: qty 30, 30d supply, fill #2

## 2021-08-13 NOTE — Progress Notes (Signed)
Established Patient Office Visit  Subjective   Patient ID: Alvin Coker., male    DOB: 10/02/1960  Age: 61 y.o. MRN: [id number]  Chief Complaint  Patient presents with   Follow-up    Has neck,shoulder, hip and back pain.    HPI  Alvin Ota. Is a 61 y/o male who has history of Anxiety, Depression,Hypertension, Chronic back pain presents for routine follow up visit.  His urinalysis done on 07/30/2021 had 3+ RBC, and trace leukocyte.  He denies dysuria urinary frequency no urgency, flank nor pelvic pain.  He reports feeling of incomplete bladder emptying and strains sometime when starting to urinate.  He has not followed up with a urologist since his hospital discharge.  He also complain of occasional hemorrhoid, states that he is unable to schedule an appointment with gastroenterologist Dr. Bailey Mech.  Per Dr. Thedora Hinders note on 07/24/2021, he was supposed to meet general surgeon Dr. Hampton Abbot with regards to his hemorrhoid.  He denies constipation states that his stools are soft, denies straining.  His blood pressure was elevated during visit, he states that he has not started taking his blood pressure medicine since the hospital discharge when it was stopped.  He denies chest pain, palpitation, lightheadedness, vision changes.  He had a chest x-ray done during his hospital course on 07/28/2021 and it showed New bilateral pleural effusions, RIGHT LOWER lobe consolidation and LEFT LOWER lobe atelectasis, and it was recommended for chest x-ray to be repeated after 1 week of his discharge from the hospital.  He states that his mood is good, denies suicidal no homicidal ideation, requests Suboxone.  Overall, he states that he is doing well and offers no further complaint.     Review of Systems  Constitutional: Negative.   HENT: Negative.    Respiratory: Negative.    Cardiovascular: Negative.   Genitourinary:  Positive for hematuria.  Neurological: Negative.   Psychiatric/Behavioral:  Negative.        Objective:     BP (!) 146/90 (BP Location: Right Arm)   Pulse 85   Temp 97.9 F (36.6 C) (Oral)   Ht '5\' 5"'$  (1.651 m)   Wt 150 lb 11.2 oz (68.4 kg)   SpO2 95%   BMI 25.08 kg/m  BP Readings from Last 3 Encounters:  08/13/21 (!) 146/90  07/30/21 119/68  07/28/21 119/72   Wt Readings from Last 3 Encounters:  08/13/21 150 lb 11.2 oz (68.4 kg)  07/30/21 152 lb (68.9 kg)  07/28/21 145 lb (65.8 kg)      Physical Exam HENT:     Head: Normocephalic and atraumatic.  Eyes:     Extraocular Movements: Extraocular movements intact.     Conjunctiva/sclera: Conjunctivae normal.     Pupils: Pupils are equal, round, and reactive to light.  Cardiovascular:     Rate and Rhythm: Normal rate and regular rhythm.     Pulses: Normal pulses.     Heart sounds: Normal heart sounds.  Pulmonary:     Effort: Pulmonary effort is normal.     Breath sounds: Normal breath sounds.  Neurological:     General: No focal deficit present.     Mental Status: He is alert and oriented to person, place, and time. Mental status is at baseline.  Psychiatric:        Mood and Affect: Mood normal.        Behavior: Behavior normal.        Thought Content: Thought content normal.  Judgment: Judgment normal.      No results found for any visits on 08/13/21.  Last CBC Lab Results  Component Value Date   WBC 7.2 07/28/2021   HGB 11.9 (L) 07/28/2021   HCT 35.0 (L) 07/28/2021   MCV 86.4 07/28/2021   MCH 29.4 07/28/2021   RDW 13.1 07/28/2021   PLT 256 61/44/3154   Last metabolic panel Lab Results  Component Value Date   GLUCOSE 102 (H) 07/28/2021   NA 138 07/28/2021   K 3.7 07/28/2021   CL 109 07/28/2021   CO2 24 07/28/2021   BUN 21 (H) 07/28/2021   CREATININE 1.22 07/28/2021   GFRNONAA >60 07/28/2021   CALCIUM 8.4 (L) 07/28/2021   PHOS 3.4 02/18/2014   PROT 7.3 07/26/2021   ALBUMIN 4.3 07/26/2021   LABGLOB 2.5 01/23/2021   AGRATIO 1.8 01/23/2021   BILITOT 0.8  07/26/2021   ALKPHOS 74 07/26/2021   AST 19 07/26/2021   ALT 13 07/26/2021   ANIONGAP 5 07/28/2021   Last lipids Lab Results  Component Value Date   CHOL 135 01/23/2021   HDL 28 (L) 01/23/2021   LDLCALC 72 01/23/2021   TRIG 212 (H) 01/23/2021   CHOLHDL 4.8 01/23/2021   Last hemoglobin A1c Lab Results  Component Value Date   HGBA1C 5.9 (H) 01/23/2021      The 10-year ASCVD risk score (Arnett DK, et al., 2019) is: 19.7%    Assessment & Plan:   1. Hemorrhoids, unspecified hemorrhoid type -He was encouraged to continue taking stool softener, and to go to the emergency room for worsening symptoms of his hemorrhoid.  He will follow-up with general surgery. - Ambulatory referral to General Surgery  2. Other microscopic hematuria -He will follow-up with urologist in regards to hematuria and incomplete bladder emptying.  He was advised to go to the emergency room for worsening symptoms. - Ambulatory referral to Urology  3. Abnormal finding on imaging -He was provided with flyer and advised to schedule an appointment for chest x-ray to be done. - DG Chest 2 View; Future  4. Essential hypertension -His blood pressure is not under control he was restarted on 30 mg lisinopril, educated on medication side effects and advised to notify clinic or go to the emergency room.  He was advised to continue on DASH diet. - lisinopril (ZESTRIL) 30 MG tablet; Take 1 tablet (30 mg total) by mouth daily.  Dispense: 30 tablet; Refill: 0  5. Gastro-esophageal reflux disease without esophagitis -He will continue current medication. -Avoid spicy, fatty and fried food -Avoid sodas and sour juices -Avoid heavy meals -Avoid eating 4 hours before bedtime -Elevate head of bed at night - pantoprazole (PROTONIX) 40 MG tablet; Take 1 tablet (40 mg total) by mouth once daily.  Dispense: 30 tablet; Refill: 2    Return in about 15 days (around 08/28/2021), or if symptoms worsen or fail to improve.     Alvin Matar Jerold Coombe, NP

## 2021-08-14 ENCOUNTER — Other Ambulatory Visit: Payer: Self-pay

## 2021-08-14 ENCOUNTER — Other Ambulatory Visit: Payer: Self-pay | Admitting: Gerontology

## 2021-08-14 DIAGNOSIS — J690 Pneumonitis due to inhalation of food and vomit: Secondary | ICD-10-CM

## 2021-08-14 DIAGNOSIS — R0602 Shortness of breath: Secondary | ICD-10-CM

## 2021-08-14 MED ORDER — DOXYCYCLINE MONOHYDRATE 100 MG PO TABS
100.0000 mg | ORAL_TABLET | Freq: Two times a day (BID) | ORAL | 0 refills | Status: DC
  Filled 2021-08-14: qty 20, 10d supply, fill #0

## 2021-08-14 MED ORDER — CEFPODOXIME PROXETIL 100 MG PO TABS
100.0000 mg | ORAL_TABLET | Freq: Two times a day (BID) | ORAL | 0 refills | Status: DC
  Filled 2021-08-14: qty 20, 10d supply, fill #0

## 2021-08-14 MED ORDER — METRONIDAZOLE 500 MG PO TABS
500.0000 mg | ORAL_TABLET | Freq: Three times a day (TID) | ORAL | 0 refills | Status: DC
  Filled 2021-08-14: qty 21, 7d supply, fill #0

## 2021-08-14 MED ORDER — ALBUTEROL SULFATE HFA 108 (90 BASE) MCG/ACT IN AERS
1.0000 | INHALATION_SPRAY | Freq: Four times a day (QID) | RESPIRATORY_TRACT | 0 refills | Status: DC | PRN
  Filled 2021-08-14: qty 6.7, 25d supply, fill #0

## 2021-08-14 MED ORDER — PREDNISONE 10 MG PO TABS
10.0000 mg | ORAL_TABLET | Freq: Every day | ORAL | 0 refills | Status: DC
  Filled 2021-08-14: qty 4, 4d supply, fill #0

## 2021-08-14 NOTE — Progress Notes (Signed)
Cefpodoxim not available at Pharmacy, medication changed to Doxycycline 100 mg bid x 10 days.Patient was notified.

## 2021-08-19 ENCOUNTER — Other Ambulatory Visit: Payer: Self-pay

## 2021-08-20 ENCOUNTER — Other Ambulatory Visit: Payer: Self-pay | Admitting: Gerontology

## 2021-08-20 ENCOUNTER — Other Ambulatory Visit: Payer: Self-pay

## 2021-08-20 DIAGNOSIS — G8929 Other chronic pain: Secondary | ICD-10-CM

## 2021-08-20 MED ORDER — GABAPENTIN 400 MG PO CAPS
400.0000 mg | ORAL_CAPSULE | Freq: Three times a day (TID) | ORAL | 0 refills | Status: DC
  Filled 2021-08-20: qty 90, 30d supply, fill #0

## 2021-08-25 ENCOUNTER — Other Ambulatory Visit: Payer: Self-pay

## 2021-08-26 ENCOUNTER — Other Ambulatory Visit: Payer: Self-pay

## 2021-08-26 ENCOUNTER — Other Ambulatory Visit: Payer: Self-pay | Admitting: Gerontology

## 2021-08-26 DIAGNOSIS — Z8659 Personal history of other mental and behavioral disorders: Secondary | ICD-10-CM

## 2021-08-26 DIAGNOSIS — Z87898 Personal history of other specified conditions: Secondary | ICD-10-CM

## 2021-08-26 MED ORDER — AMITRIPTYLINE HCL 25 MG PO TABS
25.0000 mg | ORAL_TABLET | Freq: Every day | ORAL | 4 refills | Status: DC
  Filled 2021-08-26: qty 7, 7d supply, fill #0
  Filled 2021-09-02: qty 7, 7d supply, fill #1
  Filled 2021-09-08: qty 7, 7d supply, fill #2
  Filled 2021-09-16: qty 7, 7d supply, fill #3
  Filled 2021-09-22: qty 7, 7d supply, fill #4

## 2021-08-27 ENCOUNTER — Other Ambulatory Visit: Payer: Self-pay

## 2021-08-27 ENCOUNTER — Encounter: Payer: Self-pay | Admitting: Gerontology

## 2021-08-27 ENCOUNTER — Ambulatory Visit: Payer: Medicaid Other | Admitting: Gerontology

## 2021-08-27 VITALS — BP 142/88 | HR 82 | Temp 97.8°F | Resp 16 | Ht 65.0 in | Wt 147.7 lb

## 2021-08-27 DIAGNOSIS — G8929 Other chronic pain: Secondary | ICD-10-CM

## 2021-08-27 DIAGNOSIS — R0602 Shortness of breath: Secondary | ICD-10-CM

## 2021-08-27 DIAGNOSIS — I10 Essential (primary) hypertension: Secondary | ICD-10-CM

## 2021-08-27 MED ORDER — LISINOPRIL 30 MG PO TABS
30.0000 mg | ORAL_TABLET | Freq: Every day | ORAL | 0 refills | Status: DC
  Filled 2021-08-27 – 2021-09-08 (×2): qty 90, 90d supply, fill #0

## 2021-08-27 MED ORDER — ALBUTEROL SULFATE HFA 108 (90 BASE) MCG/ACT IN AERS
1.0000 | INHALATION_SPRAY | Freq: Four times a day (QID) | RESPIRATORY_TRACT | 0 refills | Status: DC | PRN
  Filled 2021-08-27 – 2021-09-10 (×2): qty 6.7, 25d supply, fill #0

## 2021-08-27 MED ORDER — GABAPENTIN 400 MG PO CAPS
400.0000 mg | ORAL_CAPSULE | Freq: Three times a day (TID) | ORAL | 0 refills | Status: DC
  Filled 2021-08-27 – 2021-09-15 (×2): qty 90, 30d supply, fill #0

## 2021-08-27 NOTE — Patient Instructions (Signed)

## 2021-08-27 NOTE — Progress Notes (Signed)
Established Patient Office Visit  Subjective   Patient ID: Alvin Siler., male    DOB: [birth year]  Age: 61 y.o. MRN: [id number]  Chief Complaint  Patient presents with   Follow-up    Patient has insurance that will be active 09/09/21   Hypertension    HPI  Alvin Gonzalez. Is a 61 y/o male who has history of Anxiety, Depression,Hypertension, Chronic back pain presents for routine follow up visit and medication refill.  He completed the dose of doxycycline and prednisone for possible pneumonia due to result from a chest x-ray done on 08/13/2021 that showed Consolidation of right lung base with right pleural effusion are unchanged.  He denies chest pain, intermittent shortness of breath with exertion and using albuterol relieves symptoms.  His insurance will be active August 1 and he will follow-up with a new primary care provider who will recheck chest x-ray and urinalysis.  He states that he is compliant with his medications and continues to make healthy lifestyle changes.  He states that his mood is good, denies suicidal no homicidal ideation.  His blood pressure was elevated at 142/88 when rechecked, he denies chest pain, palpitation, lightheadedness and vision changes.  Overall, he states that he is doing well and offers no further complaint.  Review of Systems  Constitutional: Negative.   Eyes: Negative.   Respiratory: Negative.    Cardiovascular: Negative.   Neurological: Negative.   Psychiatric/Behavioral: Negative.        Objective:     BP (!) 142/88 (BP Location: Right Arm, Patient Position: Sitting, Cuff Size: Large)   Pulse 82   Temp 97.8 F (36.6 C) (Oral)   Resp 16   Ht '5\' 5"'$  (1.651 m)   Wt 147 lb 11.2 oz (67 kg)   SpO2 96%   BMI 24.58 kg/m  BP Readings from Last 3 Encounters:  08/27/21 (!) 142/88  08/13/21 (!) 146/90  07/30/21 119/68   Wt Readings from Last 3 Encounters:  08/27/21 147 lb 11.2 oz (67 kg)  08/13/21 150 lb 11.2 oz (68.4 kg)  07/30/21  152 lb (68.9 kg)      Physical Exam HENT:     Head: Normocephalic and atraumatic.     Mouth/Throat:     Mouth: Mucous membranes are moist.  Eyes:     Extraocular Movements: Extraocular movements intact.     Conjunctiva/sclera: Conjunctivae normal.     Pupils: Pupils are equal, round, and reactive to light.  Cardiovascular:     Rate and Rhythm: Normal rate and regular rhythm.     Pulses: Normal pulses.     Heart sounds: Normal heart sounds.  Pulmonary:     Effort: Pulmonary effort is normal.     Breath sounds: Normal breath sounds.  Skin:    General: Skin is warm.  Neurological:     General: No focal deficit present.     Mental Status: He is alert.  Psychiatric:        Mood and Affect: Mood normal.        Behavior: Behavior normal.        Thought Content: Thought content normal.        Judgment: Judgment normal.      No results found for any visits on 08/27/21.  Last CBC Lab Results  Component Value Date   WBC 7.2 07/28/2021   HGB 11.9 (L) 07/28/2021   HCT 35.0 (L) 07/28/2021   MCV 86.4 07/28/2021   MCH 29.4 07/28/2021  RDW 13.1 07/28/2021   PLT 256 09/81/1914   Last metabolic panel Lab Results  Component Value Date   GLUCOSE 102 (H) 07/28/2021   NA 138 07/28/2021   K 3.7 07/28/2021   CL 109 07/28/2021   CO2 24 07/28/2021   BUN 21 (H) 07/28/2021   CREATININE 1.22 07/28/2021   GFRNONAA >60 07/28/2021   CALCIUM 8.4 (L) 07/28/2021   PHOS 3.4 02/18/2014   PROT 7.3 07/26/2021   ALBUMIN 4.3 07/26/2021   LABGLOB 2.5 01/23/2021   AGRATIO 1.8 01/23/2021   BILITOT 0.8 07/26/2021   ALKPHOS 74 07/26/2021   AST 19 07/26/2021   ALT 13 07/26/2021   ANIONGAP 5 07/28/2021   Last lipids Lab Results  Component Value Date   CHOL 135 01/23/2021   HDL 28 (L) 01/23/2021   LDLCALC 72 01/23/2021   TRIG 212 (H) 01/23/2021   CHOLHDL 4.8 01/23/2021   Last hemoglobin A1c Lab Results  Component Value Date   HGBA1C 5.9 (H) 01/23/2021      The 10-year ASCVD risk  score (Arnett DK, et al., 2019) is: 18.8%    Assessment & Plan:   1. Chronic low back pain, unspecified back pain laterality, unspecified whether sciatica present -He will continue on current medication and follow-up at [facility] clinic.  He was advised to go to the emergency room for worsening symptoms. - gabapentin (NEURONTIN) 400 MG capsule; Take 1 capsule (400 mg total) by mouth 3 (three) times daily.  Dispense: 90 capsule; Refill: 0  2. Essential hypertension -His blood pressure is improving but not yet under control his goal should be less than 140/90.  He will continue on current medication, check blood pressure 1 week prior to his follow-up appointment, record and bring machine to the clinic.  He was advised to continue on DASH diet and exercise as tolerated. - lisinopril (ZESTRIL) 30 MG tablet; Take 1 tablet (30 mg total) by mouth daily.  Dispense: 90 tablet; Refill: 0  3. Shortness of breath -He will continue current medication, follow-up with a new primary care provider for chest x-ray repeat. - albuterol (VENTOLIN HFA) 108 (90 Base) MCG/ACT inhaler; Inhale 1-2 puffs into the lungs every 6 (six) hours as needed for wheezing or shortness of breath.  Dispense: 6.7 g; Refill: 0    Return if symptoms worsen or fail to improve.  No follow-up appointment was scheduled because his insurance will be active August 1.  [facility] wishes him well with his care.   Yahshua Thibault Jerold Coombe, NP

## 2021-09-01 ENCOUNTER — Ambulatory Visit (INDEPENDENT_AMBULATORY_CARE_PROVIDER_SITE_OTHER): Payer: Self-pay | Admitting: Surgery

## 2021-09-01 ENCOUNTER — Encounter: Payer: Self-pay | Admitting: Surgery

## 2021-09-01 VITALS — BP 168/98 | HR 82 | Temp 97.9°F | Ht 65.0 in | Wt 147.8 lb

## 2021-09-01 DIAGNOSIS — K644 Residual hemorrhoidal skin tags: Secondary | ICD-10-CM

## 2021-09-01 DIAGNOSIS — K648 Other hemorrhoids: Secondary | ICD-10-CM

## 2021-09-01 NOTE — Patient Instructions (Addendum)
Our surgery scheduler Barbara will call you within 24-48 hours to get you scheduled. If you have not heard from her after 48 hours, please call our office. Have the blue sheet available when she calls to write down important information.   If you have any concerns or questions, please feel free to call our office.   Surgical Procedures for Hemorrhoids Surgical procedures can be used to treat hemorrhoids. Hemorrhoids are swollen veins that are inside the rectum (internal hemorrhoids) or around the anus (external hemorrhoids). They are caused by increased pressure in the anal area. This pressure may result from straining to have a bowel movement (constipation), diarrhea, pregnancy, obesity, or sitting for long periods of time. Hemorrhoids can cause symptoms such as pain and bleeding. Surgery may be needed if diet changes, lifestyle changes, and other treatments do not help your symptoms. Common surgical methods that may be used include: Closed hemorrhoidectomy. The hemorrhoids are surgically removed, and the incisions are closed with stitches (sutures). Open hemorrhoidectomy. The hemorrhoids are surgically removed, but the incisions are allowed to heal without sutures. Stapled hemorrhoidectomy. The hemorrhoids are partially removed, and the incisions are closed with staples. Tell a health care provider about: Any allergies you have. All medicines you are taking, including vitamins, herbs, eye drops, creams, and over-the-counter medicines. Any problems you or family members have had with anesthetic medicines. Any blood disorders you have. Any surgeries you have had. Any medical conditions you have. Whether you are pregnant or may be pregnant. What are the risks? Generally, this is a safe procedure. However, problems may occur, including: Infection. Bleeding. Allergic reactions to medicines. Damage to other structures or organs. Pain. Constipation. Difficulty passing urine. Narrowing of the  anal canal (stenosis). Difficulty controlling bowel movements (incontinence). Recurring hemorrhoids. A new passage (fistula) that forms between the anus or rectum and another area. What happens before the procedure? Medicines Ask your health care provider about: Changing or stopping your regular medicines. This is especially important if you are taking diabetes medicines or blood thinners. Taking medicines such as aspirin and ibuprofen. These medicines can thin your blood. Do not take these medicines unless your health care provider tells you to take them. Taking over-the-counter medicines, vitamins, herbs, and supplements. Staying hydrated Follow instructions from your health care provider about hydration, which may include: Up to 2 hours before the procedure - you may continue to drink clear liquids, such as water, clear fruit juice, black coffee, and plain tea.  Eating and drinking Follow instructions from your health care provider about eating and drinking, which may include: 8 hours before the procedure - stop eating heavy meals or foods, such as meat, fried foods, or fatty foods. 6 hours before the procedure - stop eating light meals or foods, such as toast or cereal. 6 hours before the procedure - stop drinking milk or drinks that contain milk. 2 hours before the procedure - stop drinking clear liquids. General instructions You may need to have a procedure to examine the inside of your colon with a scope (colonoscopy). Your health care provider may do this to make sure that there are no other causes for your bleeding or pain. You may be instructed to take a laxative and an enema to clean out your colon before surgery (bowel prep). Carefully follow instructions from your health care provider about bowel prep. Plan to have someone take you home from the hospital or clinic. Plan to have a responsible adult care for you for at least 24 hours   after you leave the hospital or clinic. This is  important. Ask your health care provider: How your surgery site will be marked. What steps will be taken to help prevent infection. These may include: Washing skin with a germ-killing soap. Taking antibiotic medicine. What happens during the procedure? An IV will be inserted into one of your veins. You will be given one or more of the following: A medicine to help you relax (sedative). A medicine to numb the area (local anesthetic). A medicine to make you fall asleep (general anesthetic). A medicine that is injected into an area of your body to numb everything below the injection site (regional anesthetic). A lubricating jelly may be placed into your rectum. Your surgeon will insert a short scope (anoscope) into your rectum to examine the hemorrhoids. One of the following surgical methods will be used to remove the hemorrhoids: Closed hemorrhoidectomy. Your surgeon will use surgical instruments to open the tissue around the hemorrhoids. The veins that supply the hemorrhoids will be tied off with a suture. The hemorrhoids will be removed. The tissue that surrounds the hemorrhoids will be closed with sutures that your body can absorb (absorbable sutures). Open hemorrhoidectomy. The hemorrhoids will be removed with surgical instruments. The incisions will be left open to heal without sutures. Stapled hemorrhoidectomy. Your surgeon will use a circular stapling device to partially remove the hemorrhoids. The device will be inserted into your anus. It will remove a circular ring of tissue that includes hemorrhoid tissue and some tissue above the hemorrhoids. The staples in the device will close the edges of the tissue. This will cut off the blood supply to any remaining hemorrhoids and pull the tissue back into place. Each of these procedures may vary among health care providers and hospitals. What happens after the procedure? Your blood pressure, heart rate, breathing rate, and blood oxygen  level may be monitored until you leave the hospital or clinic. You will be given pain medicine as needed. Do not drive for 24 hours if you were given a sedative during your procedure. Summary Surgery may be needed for hemorrhoids if diet changes, lifestyle changes, and other treatments do not help your symptoms. There are three common methods of surgery that are used to treat hemorrhoids. Follow instructions from your health care provider about taking medicines and about eating and drinking before the procedure. You may be instructed to take a laxative and an enema to clean out your colon before surgery (bowel prep). This information is not intended to replace advice given to you by your health care provider. Make sure you discuss any questions you have with your health care provider. Document Revised: 08/07/2020 Document Reviewed: 08/07/2020 [employer] Patient Education  2023 [employer]  

## 2021-09-01 NOTE — Progress Notes (Signed)
09/01/2021  Reason for Visit: Internal and external hemorrhoids  Requesting Provider:  Caryl Asp, NP  History of Present Illness: Alvin Gonzalez. is a 61 y.o. male presenting for evaluation of internal and external hemorrhoids.  The patient has a recent history of internal hemorrhoids with bleeding issues as well as some discomfort.  The patient has been seen by Dr. Vicente Males in the past and has had 4 areas of banding over the past year.  The patient reports that despite of these procedures, he continues having issues.  He reports that he has protrusion of tissue with each bowel movement and feels that he has to push them up after a bowel movement only for them to come out or protrude more after he has been sitting for a while or after he has been ambulating.  The patient reports intermittent episodes of bleeding whether it may be on the tissue paper, or in the toilet bowl.  Denies any significant discomfort with bowel movements.  Denies any constant constipation but reports that some days his stool is softer some days his stool is harder.  Despite of that, he denies any significant straining and also denies any prolonged sitting times on the toilet.  Of note, the patient has a history multiple traumas and falls.  He reports that he has had a lot of chronic pain issues since then and has been on narcotics in the past.  He was also recently hospitalized with dehydration, urinary retention, and AKI as well as hypoxemia between 07/26/2021 and 07/28/2021.  Past Medical History: Past Medical History:  Diagnosis Date   Anxiety    Chronic back pain    Depression    Depression    Hypertension    Intermittent explosive disorder    Suicide attempt ([place])    prior to 02/18/14 admission    Tobacco abuse      Past Surgical History: Past Surgical History:  Procedure Laterality Date   "broken back"     CHEST TUBE INSERTION     COLONOSCOPY WITH PROPOFOL N/A 05/14/2021   Procedure: COLONOSCOPY  WITH PROPOFOL;  Surgeon: Jonathon Bellows, MD;  Location: [facility] ENDOSCOPY;  Service: Gastroenterology;  Laterality: N/A;   LYMPH NODE BIOPSY      Home Medications: Prior to Admission medications   Medication Sig Start Date End Date Taking? Authorizing Provider  acetaminophen (TYLENOL) 500 MG tablet Take 1,000 mg by mouth every 6 (six) hours as needed.   Yes [provider]  albuterol (VENTOLIN HFA) 108 (90 Base) MCG/ACT inhaler Inhale 1-2 puffs into the lungs every 6 (six) hours as needed for wheezing or shortness of breath. 08/27/21  Yes Iloabachie, Chioma E, NP  amitriptyline (ELAVIL) 25 MG tablet Take 1 tablet (25 mg total) by mouth at bedtime. 08/26/21  Yes Iloabachie, Chioma E, NP  aspirin 81 MG chewable tablet Chew 81 mg by mouth daily.   Yes [provider]  gabapentin (NEURONTIN) 400 MG capsule Take 1 capsule (400 mg total) by mouth 3 (three) times daily. 08/27/21  Yes Iloabachie, Chioma E, NP  lisinopril (ZESTRIL) 30 MG tablet Take 1 tablet (30 mg total) by mouth daily. 08/27/21  Yes Iloabachie, Chioma E, NP  pantoprazole (PROTONIX) 40 MG tablet Take 1 tablet (40 mg total) by mouth once daily. 08/13/21  Yes Iloabachie, Chioma E, NP  UNABLE TO FIND "Off brand arthritis medicine"   Yes [provider]    Allergies: Allergies  Allergen Reactions   Carbamazepine Rash and Hives  Ibuprofen Nausea And Vomiting and Rash    GI Bleeding    Risperidone Swelling    Tongue swelling Tongue swelling Tongue swelling Tongue swelling? Pt self report   Naproxen Other (See Comments)    stomach bleeding stomach bleeding stomach bleeding    Social History:  reports that he has been smoking cigarettes. He has a 45.00 pack-year smoking history. He quit smokeless tobacco use about 6 years ago. He reports current alcohol use. He reports current drug use. Drug: Marijuana.   Family History: Family History  Problem Relation Age of Onset   Diabetes Mother    Hypertension Mother     Other Mother        possible ovarian cancer   Heart disease Father    Hypertension Father    Cirrhosis Father    Liver cancer Father    Lung cancer Sister    Heart attack Maternal Grandmother    Suicidality Maternal Grandfather    Other Paternal Grandmother        unknown medical history   Suicidality Paternal Grandfather    Stomach cancer Cousin     Review of Systems: Review of Systems  Constitutional:  Negative for chills and fever.  HENT:  Negative for hearing loss.   Respiratory:  Negative for shortness of breath.   Cardiovascular:  Negative for chest pain.  Gastrointestinal:  Positive for blood in stool and constipation. Negative for abdominal pain, nausea and vomiting.  Genitourinary:  Negative for dysuria.  Musculoskeletal:  Negative for myalgias.  Skin:  Negative for rash.  Neurological:  Negative for dizziness.  Psychiatric/Behavioral:  Negative for depression.     Physical Exam BP (!) 168/98   Pulse 82   Temp 97.9 F (36.6 C) (Oral)   Ht '5\' 5"'$  (1.651 m)   Wt 147 lb 12.8 oz (67 kg)   SpO2 97%   BMI 24.60 kg/m  CONSTITUTIONAL: No acute distress, well-nourished HEENT:  Normocephalic, atraumatic, extraocular motion intact. NECK: Trachea is midline, and there is no jugular venous distension.  RESPIRATORY:  Normal respiratory effort without pathologic use of accessory muscles. CARDIOVASCULAR: Regular rhythm and rate. RECTAL: External exam reveals enlarged external hemorrhoids particularly in the right posterior and right anterior columns.  There is some mild tenderness to palpation particularly anteriorly.  No evidence of significantly flared hemorrhoid.  On digital rectal exam, the patient has some mild enlargement of the internal columns on the right side.  No gross blood. MUSCULOSKELETAL:  Normal muscle strength and tone in all four extremities.  No peripheral edema or cyanosis. SKIN: Skin turgor is normal. There are no pathologic skin lesions.  NEUROLOGIC:   Motor and sensation is grossly normal.  Cranial nerves are grossly intact. PSYCH:  Alert and oriented to person, place and time. Affect is normal.  Laboratory Analysis: Labs from 07/28/2021: Sodium 138, potassium 3.7, chloride 109, CO2 24, BUN 21, creatinine 1.22.  WBC 7.2, hemoglobin 11.9, hematocrit 35, platelets 256.  Imaging: Chest x-ray on 08/13/2021: IMPRESSION: Consolidation of right lung base with right pleural effusion are unchanged.  Assessment and Plan: This is a 61 y.o. male with internal and external hemorrhoid issues.  - Discussed with the patient the findings on his exam today and that he does have enlarged internal and external components of all 3 columns.  Discussed with him that we will try to approach the management of hemorrhoids conservatively first due to the significant pain that comes along with surgical intervention in the form of hemorrhoidectomy.  The  patient reports that he has been dealing with these hemorrhoids for a long time and would rather take the pain for surgery in order to alleviate his problems.  He reports that he has issues with bleeding, pushing the hemorrhoids every day multiple times, and some discomfort. - In light of this, I discussed with the patient the role for an exam under anesthesia and hemorrhoidectomy.  I reviewed the surgery at length with him including the risks of bleeding, infection, injury to surrounding structures, postoperative pain issues, avoidance of constipation, pain control, and he is willing to proceed. - We will schedule him for surgery on 09/10/2021.  I spent 80 minutes dedicated to the care of this patient on the date of this encounter to include pre-visit review of records, face-to-face time with the patient discussing diagnosis and management, and any post-visit coordination of care.   Melvyn Neth, [facility]

## 2021-09-01 NOTE — H&P (View-Only) (Signed)
09/01/2021  Reason for Visit: Internal and external hemorrhoids  Requesting Provider:  Caryl Asp, NP  History of Present Illness: Alvin Gonzalez. is a 61 y.o. male presenting for evaluation of internal and external hemorrhoids.  The patient has a recent history of internal hemorrhoids with bleeding issues as well as some discomfort.  The patient has been seen by Dr. Vicente Males in the past and has had 4 areas of banding over the past year.  The patient reports that despite of these procedures, he continues having issues.  He reports that he has protrusion of tissue with each bowel movement and feels that he has to push them up after a bowel movement only for them to come out or protrude more after he has been sitting for a while or after he has been ambulating.  The patient reports intermittent episodes of bleeding whether it may be on the tissue paper, or in the toilet bowl.  Denies any significant discomfort with bowel movements.  Denies any constant constipation but reports that some days his stool is softer some days his stool is harder.  Despite of that, he denies any significant straining and also denies any prolonged sitting times on the toilet.  Of note, the patient has a history multiple traumas and falls.  He reports that he has had a lot of chronic pain issues since then and has been on narcotics in the past.  He was also recently hospitalized with dehydration, urinary retention, and AKI as well as hypoxemia between 07/26/2021 and 07/28/2021.  Past Medical History: Past Medical History:  Diagnosis Date   Anxiety    Chronic back pain    Depression    Depression    Hypertension    Intermittent explosive disorder    Suicide attempt ([place])    prior to 02/18/14 admission    Tobacco abuse      Past Surgical History: Past Surgical History:  Procedure Laterality Date   "broken back"     CHEST TUBE INSERTION     COLONOSCOPY WITH PROPOFOL N/A 05/14/2021   Procedure: COLONOSCOPY  WITH PROPOFOL;  Surgeon: Jonathon Bellows, MD;  Location: [facility] ENDOSCOPY;  Service: Gastroenterology;  Laterality: N/A;   LYMPH NODE BIOPSY      Home Medications: Prior to Admission medications   Medication Sig Start Date End Date Taking? Authorizing Provider  acetaminophen (TYLENOL) 500 MG tablet Take 1,000 mg by mouth every 6 (six) hours as needed.   Yes [provider]  albuterol (VENTOLIN HFA) 108 (90 Base) MCG/ACT inhaler Inhale 1-2 puffs into the lungs every 6 (six) hours as needed for wheezing or shortness of breath. 08/27/21  Yes Iloabachie, Chioma E, NP  amitriptyline (ELAVIL) 25 MG tablet Take 1 tablet (25 mg total) by mouth at bedtime. 08/26/21  Yes Iloabachie, Chioma E, NP  aspirin 81 MG chewable tablet Chew 81 mg by mouth daily.   Yes [provider]  gabapentin (NEURONTIN) 400 MG capsule Take 1 capsule (400 mg total) by mouth 3 (three) times daily. 08/27/21  Yes Iloabachie, Chioma E, NP  lisinopril (ZESTRIL) 30 MG tablet Take 1 tablet (30 mg total) by mouth daily. 08/27/21  Yes Iloabachie, Chioma E, NP  pantoprazole (PROTONIX) 40 MG tablet Take 1 tablet (40 mg total) by mouth once daily. 08/13/21  Yes Iloabachie, Chioma E, NP  UNABLE TO FIND "Off brand arthritis medicine"   Yes [provider]    Allergies: Allergies  Allergen Reactions   Carbamazepine Rash and Hives  Ibuprofen Nausea And Vomiting and Rash    GI Bleeding    Risperidone Swelling    Tongue swelling Tongue swelling Tongue swelling Tongue swelling? Pt self report   Naproxen Other (See Comments)    stomach bleeding stomach bleeding stomach bleeding    Social History:  reports that he has been smoking cigarettes. He has a 45.00 pack-year smoking history. He quit smokeless tobacco use about 6 years ago. He reports current alcohol use. He reports current drug use. Drug: Marijuana.   Family History: Family History  Problem Relation Age of Onset   Diabetes Mother    Hypertension Mother     Other Mother        possible ovarian cancer   Heart disease Father    Hypertension Father    Cirrhosis Father    Liver cancer Father    Lung cancer Sister    Heart attack Maternal Grandmother    Suicidality Maternal Grandfather    Other Paternal Grandmother        unknown medical history   Suicidality Paternal Grandfather    Stomach cancer Cousin     Review of Systems: Review of Systems  Constitutional:  Negative for chills and fever.  HENT:  Negative for hearing loss.   Respiratory:  Negative for shortness of breath.   Cardiovascular:  Negative for chest pain.  Gastrointestinal:  Positive for blood in stool and constipation. Negative for abdominal pain, nausea and vomiting.  Genitourinary:  Negative for dysuria.  Musculoskeletal:  Negative for myalgias.  Skin:  Negative for rash.  Neurological:  Negative for dizziness.  Psychiatric/Behavioral:  Negative for depression.     Physical Exam BP (!) 168/98   Pulse 82   Temp 97.9 F (36.6 C) (Oral)   Ht '5\' 5"'$  (1.651 m)   Wt 147 lb 12.8 oz (67 kg)   SpO2 97%   BMI 24.60 kg/m  CONSTITUTIONAL: No acute distress, well-nourished HEENT:  Normocephalic, atraumatic, extraocular motion intact. NECK: Trachea is midline, and there is no jugular venous distension.  RESPIRATORY:  Normal respiratory effort without pathologic use of accessory muscles. CARDIOVASCULAR: Regular rhythm and rate. RECTAL: External exam reveals enlarged external hemorrhoids particularly in the right posterior and right anterior columns.  There is some mild tenderness to palpation particularly anteriorly.  No evidence of significantly flared hemorrhoid.  On digital rectal exam, the patient has some mild enlargement of the internal columns on the right side.  No gross blood. MUSCULOSKELETAL:  Normal muscle strength and tone in all four extremities.  No peripheral edema or cyanosis. SKIN: Skin turgor is normal. There are no pathologic skin lesions.  NEUROLOGIC:   Motor and sensation is grossly normal.  Cranial nerves are grossly intact. PSYCH:  Alert and oriented to person, place and time. Affect is normal.  Laboratory Analysis: Labs from 07/28/2021: Sodium 138, potassium 3.7, chloride 109, CO2 24, BUN 21, creatinine 1.22.  WBC 7.2, hemoglobin 11.9, hematocrit 35, platelets 256.  Imaging: Chest x-ray on 08/13/2021: IMPRESSION: Consolidation of right lung base with right pleural effusion are unchanged.  Assessment and Plan: This is a 61 y.o. male with internal and external hemorrhoid issues.  - Discussed with the patient the findings on his exam today and that he does have enlarged internal and external components of all 3 columns.  Discussed with him that we will try to approach the management of hemorrhoids conservatively first due to the significant pain that comes along with surgical intervention in the form of hemorrhoidectomy.  The  patient reports that he has been dealing with these hemorrhoids for a long time and would rather take the pain for surgery in order to alleviate his problems.  He reports that he has issues with bleeding, pushing the hemorrhoids every day multiple times, and some discomfort. - In light of this, I discussed with the patient the role for an exam under anesthesia and hemorrhoidectomy.  I reviewed the surgery at length with him including the risks of bleeding, infection, injury to surrounding structures, postoperative pain issues, avoidance of constipation, pain control, and he is willing to proceed. - We will schedule him for surgery on 09/10/2021.  I spent 80 minutes dedicated to the care of this patient on the date of this encounter to include pre-visit review of records, face-to-face time with the patient discussing diagnosis and management, and any post-visit coordination of care.   Melvyn Neth, [facility]

## 2021-09-02 ENCOUNTER — Other Ambulatory Visit: Payer: Self-pay

## 2021-09-02 ENCOUNTER — Telehealth: Payer: Self-pay | Admitting: Surgery

## 2021-09-02 NOTE — Telephone Encounter (Signed)
Patient has been advised of Pre-Admission date/time, and Surgery date.  Surgery Date: 09/10/21 Preadmission Testing Date: 09/05/21 (phone 8a-1p)  Patient has been made aware to call [phone], between 1-3:00pm the day before surgery, to find out what time to arrive for surgery.    Also per Dr. Hampton Abbot patient to stop aspirin before surgery with last dose being 09/04/21.  Patient informed of this, patient verbalized understanding.

## 2021-09-03 ENCOUNTER — Other Ambulatory Visit: Payer: Self-pay

## 2021-09-05 ENCOUNTER — Encounter: Payer: Self-pay | Admitting: Urology

## 2021-09-05 ENCOUNTER — Ambulatory Visit (INDEPENDENT_AMBULATORY_CARE_PROVIDER_SITE_OTHER): Payer: Self-pay | Admitting: Urology

## 2021-09-05 ENCOUNTER — Other Ambulatory Visit: Payer: Self-pay

## 2021-09-05 ENCOUNTER — Encounter
Admission: RE | Admit: 2021-09-05 | Discharge: 2021-09-05 | Disposition: A | Discharge status: Home / Self Care | Payer: Medicaid Other | Source: Ambulatory Visit | Attending: Surgery | Admitting: Surgery

## 2021-09-05 VITALS — BP 158/97 | HR 85 | Ht 65.0 in | Wt 147.0 lb

## 2021-09-05 DIAGNOSIS — N401 Enlarged prostate with lower urinary tract symptoms: Secondary | ICD-10-CM

## 2021-09-05 DIAGNOSIS — R3129 Other microscopic hematuria: Secondary | ICD-10-CM

## 2021-09-05 DIAGNOSIS — R310 Gross hematuria: Secondary | ICD-10-CM

## 2021-09-05 DIAGNOSIS — R338 Other retention of urine: Secondary | ICD-10-CM

## 2021-09-05 HISTORY — DX: Solitary pulmonary nodule: R91.1

## 2021-09-05 HISTORY — DX: Long term (current) use of opiate analgesic: Z79.891

## 2021-09-05 HISTORY — DX: Malignant (primary) neoplasm, unspecified: C80.1

## 2021-09-05 HISTORY — DX: Acute kidney failure, unspecified: N17.9

## 2021-09-05 HISTORY — DX: Post-traumatic stress disorder, unspecified: F43.10

## 2021-09-05 HISTORY — DX: Cannabis use, unspecified, uncomplicated: F12.90

## 2021-09-05 HISTORY — DX: Unspecified hemorrhoids: K64.9

## 2021-09-05 HISTORY — DX: Other chronic pain: G89.29

## 2021-09-05 HISTORY — DX: History of falling: Z91.81

## 2021-09-05 HISTORY — DX: Hypokalemia: E87.6

## 2021-09-05 HISTORY — DX: Gastro-esophageal reflux disease without esophagitis: K21.9

## 2021-09-05 HISTORY — DX: Unspecified adrenocortical insufficiency: E27.40

## 2021-09-05 LAB — URINALYSIS, COMPLETE
Bilirubin, UA: NEGATIVE
Glucose, UA: NEGATIVE
Ketones, UA: NEGATIVE
Leukocytes,UA: NEGATIVE
Nitrite, UA: NEGATIVE
Protein,UA: NEGATIVE
RBC, UA: NEGATIVE
Specific Gravity, UA: <1.005 — ABNORMAL LOW (ref 1.005–1.030)
Urobilinogen, Ur: 0.2 mg/dL (ref 0.2–1.0)
pH, UA: 6.5 (ref 5.0–7.5)

## 2021-09-05 LAB — MICROSCOPIC EXAMINATION
Bacteria, UA: NONE SEEN
Epithelial Cells (non renal): NONE SEEN /hpf (ref 0–10)
RBC, Urine: NONE SEEN /hpf (ref 0–2)

## 2021-09-05 LAB — BLADDER SCAN AMB NON-IMAGING: SCA Result: 409

## 2021-09-05 MED ORDER — TAMSULOSIN HCL 0.4 MG PO CAPS
0.4000 mg | ORAL_CAPSULE | Freq: Every day | ORAL | 0 refills | Status: DC
  Filled 2021-09-05: qty 30, 30d supply, fill #0

## 2021-09-05 NOTE — Progress Notes (Signed)
09/05/2021 9:35 AM   Alvin Ota. 1961/01/20 [id number]  Referring provider: Langston Reusing, NP Alvin Gonzalez,  [place] [address]  Chief Complaint  Patient presents with   Hematuria    HPI: Alvin Gonzalez. is a 61 y.o. male referred for hematuria.  Admitted [facility] 07/26/2021 for AKI (creatinine 5.44) Found to be in urinary retention with bladder scan volume of 800 mL and a Foley catheter was placed AKI etiology felt to be secondary to dehydration and urinary retention.  Creatinine on discharge was 1.22 He was discharged 07/28/2021 with an indwelling Foley catheter and urology follow-up for voiding trial He presented to the ED the day of discharge complaining of hemorrhoidal pain.  He indicated he cut his Foley and removed it.  UA at that visit showed >50 RBC/WBC.  Urine culture subsequently negative.  He subsequently left the ED AMA A follow-up dipstick UA at PCP office 6/21 showed 3+ blood on dipstick but no microscopy was ordered His only voiding complaint at present is urgency with occasional urge incontinence Denies gross hematuria, fever, chills   PMH: Past Medical History:  Diagnosis Date   Anxiety    Chronic back pain    Depression    Depression    Hypertension    Intermittent explosive disorder    Suicide attempt ([place])    prior to 02/18/14 admission    Tobacco abuse     Surgical History: Past Surgical History:  Procedure Laterality Date   "broken back"     CHEST TUBE INSERTION     COLONOSCOPY WITH PROPOFOL N/A 05/14/2021   Procedure: COLONOSCOPY WITH PROPOFOL;  Surgeon: Alvin Bellows, MD;  Location: [facility] ENDOSCOPY;  Service: Gastroenterology;  Laterality: N/A;   LYMPH NODE BIOPSY      Home Medications:  Allergies as of 09/05/2021       Reactions   Carbamazepine Rash, Hives   Ibuprofen Nausea And Vomiting, Rash   GI Bleeding   Risperidone Swelling   Tongue swelling Tongue swelling Tongue swelling Tongue swelling? Pt self  report   Naproxen Other (See Comments)   stomach bleeding stomach bleeding stomach bleeding        Medication List        Accurate as of September 05, 2021  9:35 AM. If you have any questions, ask your nurse or doctor.          acetaminophen 500 MG tablet Commonly known as: TYLENOL Take 1,000 mg by mouth every 6 (six) hours as needed.   albuterol 108 (90 Base) MCG/ACT inhaler Commonly known as: VENTOLIN HFA Inhale 1 to 2 puffs by mouth every 6 hours as needed for wheezing or shortness of breath (Inhale 1-2 puffs into the lungs every 6 (six) hours as needed for wheezing or shortness of breath.)   amitriptyline 25 MG tablet Commonly known as: ELAVIL Take 1 tablet (25 mg total) by mouth at bedtime.   aspirin 81 MG chewable tablet Chew 81 mg by mouth daily.   gabapentin 400 MG capsule Commonly known as: NEURONTIN Take 1 capsule (400 mg total) by mouth 3 (three) times daily.   lisinopril 30 MG tablet Commonly known as: ZESTRIL Take 1 tablet (30 mg total) by mouth daily.   pantoprazole 40 MG tablet Commonly known as: PROTONIX Take 1 tablet (40 mg total) by mouth once daily.   UNABLE TO FIND "Off brand arthritis medicine"        Allergies:  Allergies  Allergen Reactions  Carbamazepine Rash and Hives   Ibuprofen Nausea And Vomiting and Rash    GI Bleeding    Risperidone Swelling    Tongue swelling Tongue swelling Tongue swelling Tongue swelling? Pt self report   Naproxen Other (See Comments)    stomach bleeding stomach bleeding stomach bleeding    Family History: Family History  Problem Relation Age of Onset   Diabetes Mother    Hypertension Mother    Other Mother        possible ovarian cancer   Heart disease Father    Hypertension Father    Cirrhosis Father    Liver cancer Father    Lung cancer Sister    Heart attack Maternal Grandmother    Suicidality Maternal Grandfather    Other Paternal Grandmother        unknown medical history    Suicidality Paternal Grandfather    Stomach cancer Cousin     Social History:  reports that he has been smoking cigarettes. He has a 45.00 pack-year smoking history. He quit smokeless tobacco use about 6 years ago. He reports current alcohol use. He reports current drug use. Drug: Marijuana.   Physical Exam: BP (!) 158/97   Pulse 85   Ht '5\' 5"'$  (1.651 m)   Wt 147 lb (66.7 kg)   BMI 24.46 kg/m   Constitutional:  Alert and oriented, No acute distress. HEENT: [place] AT Respiratory: Normal respiratory effort GI: Abdomen is soft, nontender, nondistended, no abdominal masses GU: Refused DRE secondary to recent hemorrhoid treatment  Laboratory Data:  Urinalysis Dipstick/microscopy negative   Assessment & Plan:    1.  BPH with urinary retention PVR today 409 mL He refused Foley catheter placement Start tamsulosin 0.4 mg daily PA follow-up 1 month for symptom recheck and repeat PVR Stressed the importance of follow-up and that chronic urinary retention can lead to renal failure and decreased renal function  2.  Microhematuria Urinalysis was performed the day he removed his Foley catheter.  Urine culture was negative The microhematuria is most likely secondary to the indwelling Foley has his urinalysis today is clear   Alvin Gonzalez, [place] [address] [place], [place] [address] [phone]

## 2021-09-05 NOTE — Patient Instructions (Addendum)
Your procedure is scheduled on:09-10-21 Wednesday Report to the Registration Desk on the 1st floor of the [facility].Then proceed to the 2nd floor Surgery Desk To find out your arrival time, please call [phone] between 1PM - 3PM on:09-09-21 Tuesday If your arrival time is 6:00 am, do not arrive prior to that time as the [facility] entrance doors do not open until 6:00 am.  REMEMBER: Instructions that are not followed completely may result in serious medical risk, up to and including death; or upon the discretion of your surgeon and anesthesiologist your surgery may need to be rescheduled.  Do not eat food after midnight the night before surgery.  No gum chewing, lozengers or hard candies.  You may however, drink CLEAR liquids up to 2 hours before you are scheduled to arrive for your surgery. Do not drink anything within 2 hours of your scheduled arrival time.  Clear liquids include: - water  - apple juice without pulp - gatorade (not RED colors) - black coffee or tea (Do NOT add milk or creamers to the coffee or tea) Do NOT drink anything that is not on this list.  TAKE THESE MEDICATIONS THE MORNING OF SURGERY WITH A SIP OF WATER: -gabapentin (NEURONTIN) -tamsulosin (FLOMAX) -pantoprazole (PROTONIX) -take one the night before and one on the morning of surgery - helps to prevent nausea after surgery.)  Use your Albuterol Inhaler the day of surgery and bring your Albuterol Inhaler to the hospital  Do Fleet Enema as instructed-Do one Fleet Enema the night before your surgery and another Fleet Enema the morning of surgery 1 hour prior to your arrival time to the hospital  One week prior to surgery: Stop Anti-inflammatories (NSAIDS) such as Advil, Aleve, Ibuprofen, Motrin, Naproxen, Naprosyn and Aspirin based products such as Excedrin, Goodys Powder, BC Powder.You may however, take Tylenol if needed for pain up until the day of surgery.  Stop ANY OVER THE COUNTER  supplements/vitamins Now (09-05-21) until after surgery.  No Alcohol for 24 hours before or after surgery.  No Smoking including e-cigarettes for 24 hours prior to surgery.  No chewable tobacco products for at least 6 hours prior to surgery.  No nicotine patches on the day of surgery.  Do not use any "recreational" drugs for at least a week prior to your surgery.  Please be advised that the combination of cocaine and anesthesia may have negative outcomes, up to and including death. If you test positive for cocaine, your surgery will be cancelled.  On the morning of surgery brush your teeth with toothpaste and water, you may rinse your mouth with mouthwash if you wish. Do not swallow any toothpaste or mouthwash.  Do not wear jewelry, make-up, hairpins, clips or nail polish.  Do not wear lotions, powders, or perfumes.   Do not shave body from the neck down 48 hours prior to surgery just in case you cut yourself which could leave a site for infection.  Also, freshly shaved skin may become irritated if using the CHG soap.  Contact lenses, hearing aids and dentures may not be worn into surgery.  Do not bring valuables to the hospital. [facility] is not responsible for any missing/lost belongings or valuables.   Notify your doctor if there is any change in your medical condition (cold, fever, infection).  Wear comfortable clothing (specific to your surgery type) to the hospital.  After surgery, you can help prevent lung complications by doing breathing exercises.  Take deep breaths and cough every  1-2 hours. Your doctor may order a device called an Incentive Spirometer to help you take deep breaths. When coughing or sneezing, hold a pillow firmly against your incision with both hands. This is called "splinting." Doing this helps protect your incision. It also decreases belly discomfort.  If you are being admitted to the hospital overnight, leave your suitcase in the car. After surgery it  may be brought to your room.  If you are being discharged the day of surgery, you will not be allowed to drive home. You will need a responsible adult (18 years or older) to drive you home and stay with you that night.   If you are taking public transportation, you will need to have a responsible adult (18 years or older) with you. Please confirm with your physician that it is acceptable to use public transportation.   Please call the [facility] Dept. at [phone] if you have any questions about these instructions.  Surgery Visitation Policy:  Patients undergoing a surgery or procedure may have two family members or support persons with them as long as the person is not COVID-19 positive or experiencing its symptoms.

## 2021-09-08 ENCOUNTER — Other Ambulatory Visit: Payer: Self-pay

## 2021-09-09 MED ORDER — CHLORHEXIDINE GLUCONATE 0.12 % MT SOLN: 15.0000 mL | Freq: Once | OROMUCOSAL | Status: AC

## 2021-09-09 MED ORDER — CHLORHEXIDINE GLUCONATE CLOTH 2 % EX PADS: 6.0000 | MEDICATED_PAD | Freq: Once | CUTANEOUS | Status: DC

## 2021-09-09 MED ORDER — FLEET ENEMA 7-19 GM/118ML RE ENEM: 1.0000 | ENEMA | Freq: Once | RECTAL | Status: DC

## 2021-09-09 MED ORDER — LACTATED RINGERS IV SOLN: INTRAVENOUS | Status: DC

## 2021-09-09 MED ORDER — GABAPENTIN 300 MG PO CAPS: 300.0000 mg | ORAL_CAPSULE | ORAL | Status: AC

## 2021-09-09 MED ORDER — ACETAMINOPHEN 500 MG PO TABS: 1000.0000 mg | ORAL_TABLET | ORAL | Status: AC

## 2021-09-09 MED ORDER — BUPIVACAINE LIPOSOME 1.3 % IJ SUSP: 20.0000 mL | Freq: Once | INTRAMUSCULAR | Status: DC

## 2021-09-09 MED ORDER — SODIUM CHLORIDE 0.9 % IV SOLN
2.0000 g | INTRAVENOUS | Status: AC
  Administered 2021-09-10: 2 g via INTRAVENOUS

## 2021-09-09 MED ORDER — ORAL CARE MOUTH RINSE: 15.0000 mL | Freq: Once | OROMUCOSAL | Status: AC

## 2021-09-10 ENCOUNTER — Other Ambulatory Visit: Payer: Self-pay

## 2021-09-10 ENCOUNTER — Ambulatory Visit
Admission: RE | Admit: 2021-09-10 | Discharge: 2021-09-10 | Disposition: A | Discharge status: Home / Self Care | Payer: 59 | Attending: Surgery | Admitting: Surgery

## 2021-09-10 ENCOUNTER — Encounter
Admission: RE | Disposition: A | Discharge status: Home / Self Care | Payer: Self-pay | Source: Home / Self Care | Attending: Surgery

## 2021-09-10 ENCOUNTER — Ambulatory Visit: Payer: 59 | Admitting: General Practice

## 2021-09-10 ENCOUNTER — Encounter: Payer: Self-pay | Admitting: Surgery

## 2021-09-10 DIAGNOSIS — K648 Other hemorrhoids: Secondary | ICD-10-CM | POA: Insufficient documentation

## 2021-09-10 DIAGNOSIS — Z9181 History of falling: Secondary | ICD-10-CM | POA: Insufficient documentation

## 2021-09-10 DIAGNOSIS — K644 Residual hemorrhoidal skin tags: Secondary | ICD-10-CM

## 2021-09-10 DIAGNOSIS — G8929 Other chronic pain: Secondary | ICD-10-CM | POA: Insufficient documentation

## 2021-09-10 HISTORY — PX: EVALUATION UNDER ANESTHESIA WITH HEMORRHOIDECTOMY: SHX5624

## 2021-09-10 SURGERY — EXAM UNDER ANESTHESIA WITH HEMORRHOIDECTOMY
Anesthesia: General | Site: Rectum

## 2021-09-10 MED ORDER — LIDOCAINE HCL (CARDIAC) PF 100 MG/5ML IV SOSY
PREFILLED_SYRINGE | INTRAVENOUS | Status: DC | PRN
  Administered 2021-09-10: 50 mg via INTRAVENOUS

## 2021-09-10 MED ORDER — FENTANYL CITRATE (PF) 100 MCG/2ML IJ SOLN
INTRAMUSCULAR | Status: DC | PRN
Start: 2021-09-10 — End: 2021-09-10
  Administered 2021-09-10 (×2): 50 ug via INTRAVENOUS

## 2021-09-10 MED ORDER — GELATIN ABSORBABLE 12-7 MM EX MISC
CUTANEOUS | Status: AC
  Filled 2021-09-10: qty 1

## 2021-09-10 MED ORDER — BUPIVACAINE-EPINEPHRINE (PF) 0.5% -1:200000 IJ SOLN
INTRAMUSCULAR | Status: DC | PRN
  Administered 2021-09-10: 50 mL

## 2021-09-10 MED ORDER — OXYCODONE HCL 5 MG PO TABS
5.0000 mg | ORAL_TABLET | ORAL | 0 refills | Status: DC | PRN
  Filled 2021-09-10: qty 30, 5d supply, fill #0

## 2021-09-10 MED ORDER — FENTANYL CITRATE (PF) 100 MCG/2ML IJ SOLN
25.0000 ug | INTRAMUSCULAR | Status: DC | PRN
  Administered 2021-09-10 (×3): 25 ug via INTRAVENOUS

## 2021-09-10 MED ORDER — 0.9 % SODIUM CHLORIDE (POUR BTL) OPTIME
TOPICAL | Status: DC | PRN
  Administered 2021-09-10: 200 mL

## 2021-09-10 MED ORDER — SODIUM CHLORIDE 0.9 % IV SOLN
INTRAVENOUS | Status: AC
  Filled 2021-09-10: qty 2

## 2021-09-10 MED ORDER — MIDAZOLAM HCL 2 MG/2ML IJ SOLN
INTRAMUSCULAR | Status: AC
  Filled 2021-09-10: qty 2

## 2021-09-10 MED ORDER — ACETAMINOPHEN 500 MG PO TABS
ORAL_TABLET | ORAL | Status: AC
  Administered 2021-09-10: 1000 mg via ORAL
  Filled 2021-09-10: qty 2

## 2021-09-10 MED ORDER — OXYCODONE HCL 5 MG PO TABS
5.0000 mg | ORAL_TABLET | Freq: Once | ORAL | Status: AC | PRN
  Administered 2021-09-10: 5 mg via ORAL

## 2021-09-10 MED ORDER — PROPOFOL 10 MG/ML IV BOLUS
INTRAVENOUS | Status: AC
  Filled 2021-09-10: qty 20

## 2021-09-10 MED ORDER — DEXAMETHASONE SODIUM PHOSPHATE 10 MG/ML IJ SOLN
INTRAMUSCULAR | Status: DC | PRN
  Administered 2021-09-10: 10 mg via INTRAVENOUS

## 2021-09-10 MED ORDER — FENTANYL CITRATE (PF) 100 MCG/2ML IJ SOLN
INTRAMUSCULAR | Status: AC
  Administered 2021-09-10: 25 ug via INTRAVENOUS
  Filled 2021-09-10: qty 2

## 2021-09-10 MED ORDER — BUPIVACAINE-EPINEPHRINE (PF) 0.5% -1:200000 IJ SOLN
INTRAMUSCULAR | Status: AC
  Filled 2021-09-10: qty 30

## 2021-09-10 MED ORDER — KETAMINE HCL 10 MG/ML IJ SOLN
INTRAMUSCULAR | Status: DC | PRN
  Administered 2021-09-10: 30 mg via INTRAVENOUS

## 2021-09-10 MED ORDER — MIDAZOLAM HCL 2 MG/2ML IJ SOLN
INTRAMUSCULAR | Status: DC | PRN
  Administered 2021-09-10: 2 mg via INTRAVENOUS

## 2021-09-10 MED ORDER — GLYCOPYRROLATE 0.2 MG/ML IJ SOLN
INTRAMUSCULAR | Status: DC | PRN
  Administered 2021-09-10: .2 mg via INTRAVENOUS

## 2021-09-10 MED ORDER — GELATIN ABSORBABLE 100 EX MISC
CUTANEOUS | Status: DC | PRN
  Administered 2021-09-10: 1 via TOPICAL

## 2021-09-10 MED ORDER — BUPIVACAINE LIPOSOME 1.3 % IJ SUSP
INTRAMUSCULAR | Status: AC
  Filled 2021-09-10: qty 20

## 2021-09-10 MED ORDER — PHENYLEPHRINE HCL (PRESSORS) 10 MG/ML IV SOLN
INTRAVENOUS | Status: DC | PRN
  Administered 2021-09-10: 80 ug via INTRAVENOUS
  Administered 2021-09-10 (×3): 160 ug via INTRAVENOUS
  Administered 2021-09-10 (×2): 80 ug via INTRAVENOUS
  Administered 2021-09-10: 160 ug via INTRAVENOUS

## 2021-09-10 MED ORDER — GABAPENTIN 300 MG PO CAPS
ORAL_CAPSULE | ORAL | Status: AC
  Administered 2021-09-10: 300 mg via ORAL
  Filled 2021-09-10: qty 1

## 2021-09-10 MED ORDER — CHLORHEXIDINE GLUCONATE 0.12 % MT SOLN
OROMUCOSAL | Status: AC
  Administered 2021-09-10: 15 mL via OROMUCOSAL
  Filled 2021-09-10: qty 15

## 2021-09-10 MED ORDER — PROPOFOL 10 MG/ML IV BOLUS
INTRAVENOUS | Status: DC | PRN
  Administered 2021-09-10: 10 mg via INTRAVENOUS

## 2021-09-10 MED ORDER — VASOPRESSIN 20 UNIT/ML IV SOLN
INTRAVENOUS | Status: DC | PRN
  Administered 2021-09-10: 1 [IU] via INTRAVENOUS
  Administered 2021-09-10: .5 [IU] via INTRAVENOUS
  Administered 2021-09-10 (×3): 1 [IU] via INTRAVENOUS

## 2021-09-10 MED ORDER — LIDOCAINE 5 % EX OINT
1.0000 | TOPICAL_OINTMENT | Freq: Four times a day (QID) | CUTANEOUS | 0 refills | Status: DC | PRN
  Filled 2021-09-10: qty 35.44, 9d supply, fill #0

## 2021-09-10 MED ORDER — KETAMINE HCL 50 MG/5ML IJ SOSY
PREFILLED_SYRINGE | INTRAMUSCULAR | Status: AC
  Filled 2021-09-10: qty 5

## 2021-09-10 MED ORDER — FENTANYL CITRATE (PF) 100 MCG/2ML IJ SOLN
INTRAMUSCULAR | Status: AC
  Filled 2021-09-10: qty 2

## 2021-09-10 MED ORDER — OXYCODONE HCL 5 MG/5ML PO SOLN: 5.0000 mg | Freq: Once | ORAL | Status: AC | PRN

## 2021-09-10 MED ORDER — OXYCODONE HCL 5 MG PO TABS
ORAL_TABLET | ORAL | Status: AC
  Filled 2021-09-10: qty 1

## 2021-09-10 SURGICAL SUPPLY — 34 items
DRAPE PERI LITHO V/GYN (MISCELLANEOUS) ×2 IMPLANT
DRAPE UNDER BUTTOCK W/FLU (DRAPES) ×2 IMPLANT
DRSG GAUZE FLUFF 36X18 (GAUZE/BANDAGES/DRESSINGS) ×2 IMPLANT
ELECT CAUTERY BLADE TIP 2.5 (TIP) ×2
ELECT REM PT RETURN 9FT ADLT (ELECTROSURGICAL) ×2
ELECTRODE CAUTERY BLDE TIP 2.5 (TIP) ×1 IMPLANT
ELECTRODE REM PT RTRN 9FT ADLT (ELECTROSURGICAL) ×1 IMPLANT
GAUZE 4X4 16PLY ~~LOC~~+RFID DBL (SPONGE) ×1 IMPLANT
GLOVE SURG SYN 7.0 (GLOVE) ×6 IMPLANT
GLOVE SURG SYN 7.0 PF PI (GLOVE) ×1 IMPLANT
GLOVE SURG SYN 7.5  E (GLOVE) ×3
GLOVE SURG SYN 7.5 E (GLOVE) ×3 IMPLANT
GLOVE SURG SYN 7.5 PF PI (GLOVE) ×1 IMPLANT
GOWN STRL REUS W/ TWL LRG LVL3 (GOWN DISPOSABLE) ×2 IMPLANT
GOWN STRL REUS W/TWL LRG LVL3 (GOWN DISPOSABLE) ×3
KIT TURNOVER KIT A (KITS) ×2 IMPLANT
LABEL OR SOLS (LABEL) ×2 IMPLANT
MANIFOLD NEPTUNE II (INSTRUMENTS) ×2 IMPLANT
NEEDLE HYPO 22GX1.5 SAFETY (NEEDLE) ×2 IMPLANT
NS IRRIG 500ML POUR BTL (IV SOLUTION) ×2 IMPLANT
PACK BASIN MINOR ARMC (MISCELLANEOUS) ×2 IMPLANT
PAD OB MATERNITY 4.3X12.25 (PERSONAL CARE ITEMS) ×2 IMPLANT
PAD PREP 24X41 OB/GYN DISP (PERSONAL CARE ITEMS) ×2 IMPLANT
PANTS MESH DISP 2XL (UNDERPADS AND DIAPERS) ×2 IMPLANT
SHEARS HARMONIC 9CM CVD (BLADE) ×2 IMPLANT
SOL PREP PVP 2OZ (MISCELLANEOUS) ×2
SOLUTION PREP PVP 2OZ (MISCELLANEOUS) ×1 IMPLANT
SPONGE SURGIFOAM ABS GEL 100 (HEMOSTASIS) ×1 IMPLANT
SPONGE SURGIFOAM ABS GEL 12-7 (HEMOSTASIS) IMPLANT
SURGILUBE 2OZ TUBE FLIPTOP (MISCELLANEOUS) ×2 IMPLANT
SUT VIC AB 2-0 SH 27 (SUTURE) ×1
SUT VIC AB 2-0 SH 27XBRD (SUTURE) ×2 IMPLANT
SYR 10ML LL (SYRINGE) ×2 IMPLANT
SYR BULB IRRIG 60ML STRL (SYRINGE) ×2 IMPLANT

## 2021-09-10 NOTE — Transfer of Care (Signed)
Immediate Anesthesia Transfer of Care Note  Patient: Alvin Gonzalez.  Procedure(s) Performed: EXAM UNDER ANESTHESIA WITH HEMORRHOIDECTOMY of 2+ columns (Rectum)  Patient Location: PACU  Anesthesia Type:General  Level of Consciousness: sedated  Airway & Oxygen Therapy: Patient Spontanous Breathing and Patient connected to face mask oxygen  Post-op Assessment: Report given to RN and Post -op Vital signs reviewed and stable  Post vital signs: Reviewed and stable  Last Vitals:  Vitals Value Taken Time  BP 113/68 09/10/21 1038  Temp 36 C 09/10/21 1038  Pulse 67 09/10/21 1039  Resp 13 09/10/21 1039  SpO2 99 % 09/10/21 1039  Vitals shown include unvalidated device data.  Last Pain:  Vitals:   09/10/21 0813  TempSrc: Temporal  PainSc: 8          Complications: No notable events documented.

## 2021-09-10 NOTE — Interval H&P Note (Signed)
History and Physical Interval Note:  09/10/2021 8:04 AM  Alvin Gonzalez.  has presented today for surgery, with the diagnosis of internal, external hemorroids.  The various methods of treatment have been discussed with the patient and family. After consideration of risks, benefits and other options for treatment, the patient has consented to  Procedure(s) with comments: EXAM UNDER ANESTHESIA WITH HEMORRHOIDECTOMY of 2+ columns (N/A) - Provider requesting 1.5 hours / 90 minutes for procedure. as a surgical intervention.  The patient's history has been reviewed, patient examined, no change in status, stable for surgery.  I have reviewed the patient's chart and labs.  Questions were answered to the patient's satisfaction.     [name]

## 2021-09-10 NOTE — Op Note (Signed)
Procedure Date:  09/10/2021  Pre-operative Diagnosis:  Internal and external hemorrhoids  Post-operative Diagnosis: Internal and external hemorrhoids  Procedure:  Exam under Anesthesia, Hemorrhoidectomy of 3 columns  Surgeon:  Melvyn Neth, MD  Anesthesia:  General endotracheal  Estimated Blood Loss:  5 ml  Specimens: Left lateral hemorrhoid Right anterior hemorrhoid Right posterior hemorrhoid  Complications:  None  Indications for Procedure:  This is a 61 y.o. male with a history of internal and external hemorrhoids, presenting for hemorrhoidectomy.  The risks of bleeding, infection, bowel injury, and need for further procedures were all discussed with the patient and he was willing to proceed.   Description of Procedure: The patient was correctly identified in the preoperative area and brought into the operating room.  The patient was placed supine with VTE prophylaxis in place.  Appropriate time-outs were performed.  Anesthesia was induced and the patient was intubated.  Appropriate antibiotics were infused.  The patient was then placed in high lithotomy position.   The perianal area was prepped and draped in usual sterile fashion.  The sphincter was digitally dilated.  Then the anoscope was inserted and the anal canal was evaluated, revealing enlarged internal and external components of all three columns. The right posterior was the largest, then left lateral, then right anterior.  The bivalve retractor was then inserted, and we started with the right posterior column.  Cautery was used to incise the skin of the external component, making sure the sphincter muscle was not injured.  Then the Harmonic was used to take the internal component down to its pedicle.  Specimen was sent to pathology.  Similarly, the right anterior and the left lateral columns were taken down.  No injury was noted to the sphincter muscle.  Hemostasis was good.  Minor oozing was controlled with cautery.  50 ml  of Exparel solution was infiltrated into the perianal area and as bilateral pudendal blocks.  A large gelfoam gauze was rolled and inserted into the anal canal for further hemostasis.  The perianal area was cleaned and dressed with fluffed gauze and mesh underwear.  The patient was emerged from anesthesia and extubated and brought to the recovery room for further management.  The patient tolerated the procedure well and all counts were correct at the end of the case.   Melvyn Neth, MD

## 2021-09-10 NOTE — Anesthesia Preprocedure Evaluation (Signed)
Anesthesia Evaluation  Patient identified by MRN, date of birth, ID band Patient awake    Reviewed: Allergy & Precautions, NPO status , Patient's Chart, lab work & pertinent test results  Airway Mallampati: III  TM Distance: >3 FB Neck ROM: full    Dental  (+) Teeth Intact   Pulmonary neg pulmonary ROS, neg shortness of breath, neg COPD, Current Smoker,    Pulmonary exam normal        Cardiovascular hypertension, (-) angina(-) CAD, (-) Past MI and (-) CABG negative cardio ROS Normal cardiovascular exam     Neuro/Psych PSYCHIATRIC DISORDERS negative neurological ROS     GI/Hepatic Neg liver ROS, GERD  ,  Endo/Other  negative endocrine ROS  Renal/GU      Musculoskeletal   Abdominal   Peds  Hematology negative hematology ROS (+)   Anesthesia Other Findings Past Medical History: No date: Adrenal insufficiency (HCC) No date: AKI (acute kidney injury) ([place]) No date: Anxiety No date: Cancer ([place]) No date: Chronic back pain No date: Chronic pain No date: Chronic prescription opiate use No date: Depression No date: Depression No date: GERD (gastroesophageal reflux disease) No date: Hemorrhoids No date: Hypertension No date: Hypokalemia No date: Intermittent explosive disorder No date: Marijuana use No date: PTSD (post-traumatic stress disorder) No date: Pulmonary nodule No date: Risk for falls No date: Suicide attempt [facility])     Comment:  prior to 02/18/14 admission  No date: Tobacco abuse  Past Surgical History: No date: CHEST TUBE INSERTION 05/14/2021: COLONOSCOPY WITH PROPOFOL; N/A     Comment:  Procedure: COLONOSCOPY WITH PROPOFOL;  Surgeon: Jonathon Bellows, MD;  Location: [facility] ENDOSCOPY;  Service:               Gastroenterology;  Laterality: N/A; No date: LYMPH NODE BIOPSY  BMI    Body Mass Index: 24.46 kg/m      Reproductive/Obstetrics negative OB ROS                              Anesthesia Physical Anesthesia Plan  ASA: 2  Anesthesia Plan:    Post-op Pain Management:    Induction: Intravenous  PONV Risk Score and Plan: 2 and Dexamethasone, Ondansetron, Midazolam and Treatment may vary due to age or medical condition  Airway Management Planned: LMA  Additional Equipment:   Intra-op Plan:   Post-operative Plan: Extubation in OR  Informed Consent: I have reviewed the patients History and Physical, chart, labs and discussed the procedure including the risks, benefits and alternatives for the proposed anesthesia with the patient or authorized representative who has indicated his/her understanding and acceptance.     Dental Advisory Given  Plan Discussed with: Anesthesiologist, CRNA and Surgeon  Anesthesia Plan Comments: (Patient consented for risks of anesthesia including but not limited to:  - adverse reactions to medications - damage to eyes, teeth, lips or other oral mucosa - nerve damage due to positioning  - sore throat or hoarseness - Damage to heart, brain, nerves, lungs, other parts of body or loss of life  Patient voiced understanding.)        Anesthesia Quick Evaluation

## 2021-09-10 NOTE — Anesthesia Procedure Notes (Signed)
Procedure Name: LMA Insertion Date/Time: 09/10/2021 9:26 AM  Performed by: Biagio Borg, CRNAPre-anesthesia Checklist: Patient identified, Emergency Drugs available, Suction available and Patient being monitored Patient Re-evaluated:Patient Re-evaluated prior to induction Oxygen Delivery Method: Circle system utilized Preoxygenation: Pre-oxygenation with 100% oxygen Induction Type: IV induction Ventilation: Mask ventilation without difficulty LMA: LMA inserted LMA Size: 4.0 Tube type: Oral Number of attempts: 1 Placement Confirmation: positive ETCO2 and breath sounds checked- equal and bilateral Tube secured with: Tape Dental Injury: Teeth and Oropharynx as per pre-operative assessment

## 2021-09-10 NOTE — Discharge Instructions (Signed)

## 2021-09-11 LAB — SURGICAL PATHOLOGY

## 2021-09-11 NOTE — Anesthesia Postprocedure Evaluation (Signed)
Anesthesia Post Note  Patient: Merrel Crabbe.  Procedure(s) Performed: EXAM UNDER ANESTHESIA WITH HEMORRHOIDECTOMY of 2+ columns (Rectum)  Patient location during evaluation: PACU Anesthesia Type: General Level of consciousness: awake and alert Pain management: pain level controlled Vital Signs Assessment: post-procedure vital signs reviewed and stable Respiratory status: spontaneous breathing, nonlabored ventilation, respiratory function stable and patient connected to nasal cannula oxygen Cardiovascular status: blood pressure returned to baseline and stable Postop Assessment: no apparent nausea or vomiting Anesthetic complications: no   No notable events documented.   Last Vitals:  Vitals:   09/10/21 1130 09/10/21 1146  BP: 135/81 133/75  Pulse: 82 84  Resp: 12 14  Temp: (!) 36 C (!) 36.3 C  SpO2: 94% 95%    Last Pain:  Vitals:   09/10/21 1146  TempSrc: Temporal  PainSc: Greasy

## 2021-09-12 ENCOUNTER — Encounter: Payer: Self-pay | Admitting: Surgery

## 2021-09-12 ENCOUNTER — Telehealth: Payer: Self-pay

## 2021-09-12 ENCOUNTER — Other Ambulatory Visit: Payer: Self-pay

## 2021-09-12 ENCOUNTER — Other Ambulatory Visit: Payer: Self-pay | Admitting: Surgery

## 2021-09-12 MED ORDER — OXYCODONE HCL 5 MG PO TABS
5.0000 mg | ORAL_TABLET | ORAL | 0 refills | Status: DC | PRN
  Filled 2021-09-15: qty 30, 5d supply, fill #0

## 2021-09-12 NOTE — Telephone Encounter (Signed)
Spoke with patient- Hemorrhoidectomy a few days ago. He is having a lot of pain and is requesting some additional medication. I spoke with Dr.Piscoya-patient instructed to call PCP to see if Gabapentin can be increased- start using the lidocaine and a refill will be called in to [facility] for the Oxycodone- patient stated he couldn't pick up the medication until Monday so we discussed taking 11/2 tablet of the Oxycodone so he would have enough to get through the weekend. We discussed sitz baths-he only has a shower and stated he has been holding the sprayer over the area. He has started taking Miralax for constipation.

## 2021-09-15 ENCOUNTER — Other Ambulatory Visit: Payer: Self-pay

## 2021-09-16 ENCOUNTER — Other Ambulatory Visit: Payer: Self-pay

## 2021-09-17 ENCOUNTER — Other Ambulatory Visit: Payer: Self-pay

## 2021-09-19 ENCOUNTER — Other Ambulatory Visit: Payer: Self-pay

## 2021-09-21 ENCOUNTER — Encounter: Payer: Self-pay | Admitting: Emergency Medicine

## 2021-09-21 ENCOUNTER — Other Ambulatory Visit: Payer: Self-pay

## 2021-09-21 ENCOUNTER — Emergency Department: Payer: 59

## 2021-09-21 ENCOUNTER — Emergency Department
Admission: EM | Admit: 2021-09-21 | Discharge: 2021-09-21 | Disposition: A | Discharge status: Home / Self Care | Payer: 59 | Attending: Emergency Medicine | Admitting: Emergency Medicine

## 2021-09-21 DIAGNOSIS — K6289 Other specified diseases of anus and rectum: Secondary | ICD-10-CM | POA: Diagnosis present

## 2021-09-21 MED ORDER — LIDOCAINE HCL URETHRAL/MUCOSAL 2 % EX GEL
1.0000 | Freq: Once | CUTANEOUS | Status: AC
  Administered 2021-09-21: 1 via TOPICAL
  Filled 2021-09-21: qty 6

## 2021-09-21 MED ORDER — LIDOCAINE 5 % EX OINT
1.0000 | TOPICAL_OINTMENT | Freq: Four times a day (QID) | CUTANEOUS | 0 refills | Status: DC | PRN
  Filled 2021-09-21: qty 35.44, 15d supply, fill #0

## 2021-09-21 NOTE — Discharge Instructions (Signed)
Restart taking your stool softener especially if you continue taking the pain medication.  A prescription for lidocaine ointment 5% was sent to the pharmacy.  Also call your surgeon's office tomorrow to see if there is any need to be seen earlier than your already scheduled appointment.  You may also use MiraLAX as needed.  X-ray today does not show any constipation.  Do not use any enemas or apply any suppositories in the rectum as it is healing at this time and could cause more injury.

## 2021-09-21 NOTE — ED Provider Notes (Signed)
[facility] Provider Note    Event Date/Time   First MD Initiated Contact with Patient 09/21/21 1230     (approximate)   History   Rectal Pain   HPI  Alvin Gonzalez. is a 61 y.o. male   presents to the ED with complaint of rectal pain.  Patient states that he feels like there is "something" that is making him uncomfortable.  Patient states he strained this morning and saw some bright red blood.  Patient had rectal surgery the beginning of the month for both internal and external hemorrhoids.  He has been using MiraLAX and a stool softener often but recently discontinued the stool softener.  He continues to take the oxycodone as needed for pain.  He has a follow-up appointment with his surgeon later this month.  Patient reports that he had a bowel movement this morning.  Prior to today he is off so used an enema as he felt like he was constipated.      Physical Exam   Triage Vital Signs: ED Triage Vitals  Enc Vitals Group     BP 09/21/21 1224 123/87     Pulse Rate 09/21/21 1220 96     Resp 09/21/21 1220 18     Temp 09/21/21 1220 98 F (36.7 C)     Temp Source 09/21/21 1220 Oral     SpO2 09/21/21 1220 95 %     Weight 09/21/21 1221 150 lb (68 kg)     Height 09/21/21 1221 '5\' 5"'$  (1.651 m)     Head Circumference --      Peak Flow --      Pain Score 09/21/21 1221 10     Pain Loc --      Pain Edu? --      Excl. in [place]? --     Most recent vital signs: Vitals:   09/21/21 1220 09/21/21 1224  BP:  123/87  Pulse: 96   Resp: 18   Temp: 98 F (36.7 C)   SpO2: 95%      General: Awake, no distress.  CV:  Good peripheral perfusion.  Resp:  Normal effort.  Abd:  No distention.  Soft, flat, nontender. Other:  Rectal exam no bleeding noted.  There is no discoloration or evidence of infection.   ED Results / Procedures / Treatments   Labs (all labs ordered are listed, but only abnormal results are displayed) Labs Reviewed - No data to  display    RADIOLOGY 1 view abdomen reviewed and interpreted by myself independent of the radiologist is negative for stool burden.    PROCEDURES:  Critical Care performed:   Procedures   MEDICATIONS ORDERED IN ED: Medications  lidocaine (XYLOCAINE) 2 % jelly 1 Application (1 Application Topical Given 09/21/21 1349)     IMPRESSION / MDM / ASSESSMENT AND PLAN / ED COURSE  I reviewed the triage vital signs and the nursing notes.   Differential diagnosis includes, but is not limited to,   61 year old male presents to the ED with complaint of rectal pain after having surgery for internal and external hemorrhoids.  Patient states he has had a sensation that there is "something stuck".  Patient has used enemas after he discontinued taking the stool softener and MiraLAX on the advice of his mother and sister.  This morning he passed a very hard stool and states that he was straining.  X-ray was reassuring and patient was made aware.  We discussed in length  in front of his mother that he should be using the stool softeners and MiraLAX until told to discontinue by Dr. Hampton Abbot.  He is also encouraged to use the lidocaine ointment that was sent to the pharmacy as needed for pain.  He will call Dr. Mont Dutton office tomorrow for further instructions.      Patient's presentation is most consistent with acute complicated illness / injury requiring diagnostic workup.  FINAL CLINICAL IMPRESSION(S) / ED DIAGNOSES   Final diagnoses:  Rectal or anal pain     Rx / DC Orders   ED Discharge Orders          Ordered    lidocaine (XYLOCAINE) 5 % ointment  4 times daily PRN        09/21/21 1432             Note:  This document was prepared using Dragon voice recognition software and may include unintentional dictation errors.   Johnn Hai, PA-C 09/21/21 1437    Blake Divine, MD 09/22/21 [phone]

## 2021-09-21 NOTE — ED Triage Notes (Signed)
Pt via POV from home. Pt c/o rectal pain states that he was trying to push "something" out the past couple of days. States that he was strained this morning and now having pain. Pt states he may think he had strained too hard.  Pt did recently hemorrhoid surgery and is taking narcotics. Pt is A&Ox4 and NAD

## 2021-09-22 ENCOUNTER — Other Ambulatory Visit: Payer: Self-pay

## 2021-09-22 ENCOUNTER — Telehealth: Payer: Self-pay | Admitting: Surgery

## 2021-09-22 NOTE — Telephone Encounter (Signed)
Patient had hemorrhoidectomy on 09/10/21 with Dr. Hampton Abbot.  Went to ER yesterday with rectal pain.  Is wanting something for his pain, says his pain is very painful.  He is going to the bathroom but still feels like there is some blockage and very painful.  Please call him. Thanks you

## 2021-09-23 ENCOUNTER — Other Ambulatory Visit: Payer: Self-pay

## 2021-09-24 ENCOUNTER — Ambulatory Visit (INDEPENDENT_AMBULATORY_CARE_PROVIDER_SITE_OTHER): Payer: 59 | Admitting: Surgery

## 2021-09-24 ENCOUNTER — Encounter: Payer: Self-pay | Admitting: Surgery

## 2021-09-24 ENCOUNTER — Other Ambulatory Visit: Payer: Self-pay

## 2021-09-24 VITALS — BP 177/107 | HR 80 | Temp 97.6°F | Wt 146.8 lb

## 2021-09-24 DIAGNOSIS — Z09 Encounter for follow-up examination after completed treatment for conditions other than malignant neoplasm: Secondary | ICD-10-CM

## 2021-09-24 DIAGNOSIS — K648 Other hemorrhoids: Secondary | ICD-10-CM

## 2021-09-24 DIAGNOSIS — K644 Residual hemorrhoidal skin tags: Secondary | ICD-10-CM

## 2021-09-24 MED ORDER — LIDOCAINE 5 % EX OINT
1.0000 | TOPICAL_OINTMENT | Freq: Four times a day (QID) | CUTANEOUS | 0 refills | Status: DC | PRN
  Filled 2021-09-24 (×2): qty 35.44, 9d supply, fill #0

## 2021-09-24 MED ORDER — OXYCODONE HCL 5 MG PO TABS
5.0000 mg | ORAL_TABLET | ORAL | 0 refills | Status: DC | PRN
  Filled 2021-09-24: qty 30, 5d supply, fill #0

## 2021-09-24 MED ORDER — HYDROCORTISONE (PERIANAL) 2.5 % EX CREA
1.0000 | TOPICAL_CREAM | Freq: Two times a day (BID) | CUTANEOUS | 0 refills | Status: DC
  Filled 2021-09-24: qty 28, 9d supply, fill #0

## 2021-09-24 NOTE — Patient Instructions (Addendum)
Pick up your prescriptions at your pharmacy. Keep doing [employer]. Continue with the stool softeners and Miralax.    If you have any concerns or questions, please feel free to call our office.   Surgical Procedures for Hemorrhoids, Care After This sheet gives you information about how to care for yourself after your procedure. Your health care provider may also give you more specific instructions. If you have problems or questions, contact your health care provider. What can I expect after the procedure? After the procedure, it is common to have: Rectal pain. Pain when you are having a bowel movement. Slight rectal bleeding. This is more likely to happen with the first bowel movement after surgery. Follow these instructions at home: Medicines Take over-the-counter and prescription medicines only as told by your health care provider. If you were prescribed an antibiotic medicine, use it as told by your health care provider. Do not stop using the antibiotic even if your condition improves. Ask your health care provider if the medicine prescribed to you requires you to avoid driving or using heavy machinery. Use a stool softener or a bulk laxative as told by your health care provider. Eating and drinking Follow instructions from your health care provider about what to eat or drink after your procedure. You may need to take actions to prevent or treat constipation, such as: Drink enough fluid to keep your urine pale yellow. Take over-the-counter or prescription medicines. Eat foods that are high in fiber, such as beans, whole grains, and fresh fruits and vegetables. Limit foods that are high in fat and processed sugars, such as fried or sweet foods. Activity  Rest as told by your health care provider. Avoid sitting for a long time without moving. Get up to take short walks every 1-2 hours. This is important to improve blood flow and breathing. Ask for help if you feel weak or  unsteady. Return to your normal activities as told by your health care provider. Ask your health care provider what activities are safe for you. Do not lift anything that is heavier than 10 lb (4.5 kg), or the limit that you are told, until your health care provider says that it is safe. Do not strain to have a bowel movement. Do not spend a long time sitting on the toilet. General instructions  Take warm sitz baths for 15-20 minutes, 2-3 times a day to relieve soreness or itching and to keep the rectal area clean. Apply ice packs to the area to reduce swelling and pain. Do not drive for 24 hours if you were given a sedative during your procedure. Keep all follow-up visits as told by your health care provider. This is important. Contact a health care provider if: Your pain medicine is not helping. You have a fever or chills. You have bad smelling drainage. You have a lot of swelling. You become constipated. You have trouble passing urine. Get help right away if: You have very bad rectal pain. You have heavy bleeding from your rectum. Summary After the procedure, it is common to have pain and slight rectal bleeding. Take warm sitz baths for 15-20 minutes, 2-3 times a day to relieve soreness or itching and to keep the rectal area clean. Avoid straining when having a bowel movement. Eat foods that are high in fiber, such as beans, whole grains, and fresh fruits and vegetables. Take over-the-counter and prescription medicines only as told by your health care provider. This information is not intended to replace advice given  to you by your health care provider. Make sure you discuss any questions you have with your health care provider. Document Revised: 08/07/2020 Document Reviewed: 08/07/2020 Elsevier Patient Education  [place].

## 2021-09-25 ENCOUNTER — Other Ambulatory Visit: Payer: Self-pay

## 2021-09-25 MED ORDER — LIDOCAINE 5 % EX OINT
1.0000 | TOPICAL_OINTMENT | Freq: Four times a day (QID) | CUTANEOUS | 0 refills | Status: DC | PRN
  Filled 2021-09-25: qty 35.44, 9d supply, fill #0

## 2021-09-26 ENCOUNTER — Other Ambulatory Visit: Payer: Self-pay

## 2021-09-26 ENCOUNTER — Encounter: Payer: Self-pay | Admitting: Surgery

## 2021-09-26 NOTE — Progress Notes (Signed)
09/24/2021  HPI: Alvin Gonzalez. is a 61 y.o. male s/p EUA and hemorrhoidectomy on 09/10/21.  He presents for follow up.  He reports he's having a lot of pain.  He presented to the ED on 8/13 after being constipated and has been having pain.  Denies any bleeding.    Vital signs: BP (!) 177/107   Pulse 80   Temp 97.6 F (36.4 C) (Oral)   Wt 146 lb 12.8 oz (66.6 kg)   SpO2 98%   BMI 24.43 kg/m    Physical Exam: Constitutional:  No acute distress Rectal: External exam reveals some mild enlargement of the external tissue, likely from his constipation.  The tissue is soft, but is tender in all columns.  The open wounds from the hemorrhoidectomy are otherwise healing well, becoming more shallow.  Assessment/Plan: This is a 61 y.o. male s/p EUA and hemorrhoidectomy.  --Discussed with the patient that the pain is related to his constipation and having to put more strain on the external tissue as it's trying to heal.  This will improve but discussed that we need to work on different things:  will give him a prescription for anusol ointment to help with the inflammation/swelling, will give him lidocaine ointment and oxycodone prescription as well to help with pain control, he should continue with Sitz baths, and he should continue with bowel regimen as previously instructed, with Miralax +/- stool softeners to maintain a soft bowel movement daily, avoiding constipation or diarrhea. --Follow up in 2 weeks.   Melvyn Neth, [facility]

## 2021-09-30 ENCOUNTER — Other Ambulatory Visit: Payer: Self-pay | Admitting: Gerontology

## 2021-09-30 ENCOUNTER — Other Ambulatory Visit: Payer: Self-pay

## 2021-09-30 DIAGNOSIS — Z87898 Personal history of other specified conditions: Secondary | ICD-10-CM

## 2021-09-30 DIAGNOSIS — Z8659 Personal history of other mental and behavioral disorders: Secondary | ICD-10-CM

## 2021-10-01 ENCOUNTER — Other Ambulatory Visit: Payer: Self-pay

## 2021-10-01 MED ORDER — AMITRIPTYLINE HCL 25 MG PO TABS
ORAL_TABLET | ORAL | 1 refills | Status: DC
  Filled 2021-10-01: qty 30, 30d supply, fill #0
  Filled 2021-11-05: qty 30, 30d supply, fill #1

## 2021-10-08 ENCOUNTER — Other Ambulatory Visit: Payer: Self-pay

## 2021-10-08 ENCOUNTER — Ambulatory Visit (INDEPENDENT_AMBULATORY_CARE_PROVIDER_SITE_OTHER): Payer: 59 | Admitting: Urology

## 2021-10-08 ENCOUNTER — Encounter: Payer: Self-pay | Admitting: Surgery

## 2021-10-08 ENCOUNTER — Encounter: Payer: Self-pay | Admitting: Urology

## 2021-10-08 ENCOUNTER — Ambulatory Visit (INDEPENDENT_AMBULATORY_CARE_PROVIDER_SITE_OTHER): Payer: 59 | Admitting: Surgery

## 2021-10-08 VITALS — BP 142/95 | HR 90 | Temp 98.1°F | Ht 65.0 in | Wt 147.0 lb

## 2021-10-08 VITALS — BP 130/70 | HR 74 | Ht 65.0 in | Wt 147.0 lb

## 2021-10-08 DIAGNOSIS — N401 Enlarged prostate with lower urinary tract symptoms: Secondary | ICD-10-CM | POA: Diagnosis not present

## 2021-10-08 DIAGNOSIS — K644 Residual hemorrhoidal skin tags: Secondary | ICD-10-CM

## 2021-10-08 DIAGNOSIS — K648 Other hemorrhoids: Secondary | ICD-10-CM

## 2021-10-08 DIAGNOSIS — R338 Other retention of urine: Secondary | ICD-10-CM | POA: Diagnosis not present

## 2021-10-08 DIAGNOSIS — Z09 Encounter for follow-up examination after completed treatment for conditions other than malignant neoplasm: Secondary | ICD-10-CM

## 2021-10-08 LAB — BLADDER SCAN AMB NON-IMAGING: Scan Result: 99

## 2021-10-08 MED ORDER — TAMSULOSIN HCL 0.4 MG PO CAPS
0.8000 mg | ORAL_CAPSULE | Freq: Every day | ORAL | 11 refills | Status: DC
  Filled 2021-10-08: qty 60, 30d supply, fill #0
  Filled 2021-12-09: qty 60, 30d supply, fill #1
  Filled 2022-01-12: qty 60, 30d supply, fill #2
  Filled 2022-02-11: qty 60, 30d supply, fill #3

## 2021-10-08 NOTE — Progress Notes (Signed)
10/08/2021  HPI: Alvin Gonzalez. is a 61 y.o. male s/p exam under anesthesia and hemorrhoidectomy on 09/10/2021.  Patient presents today for further follow-up.  He was last seen on 09/24/2021 after a recent visit to the emergency room due to constipation and rectal pain.  Today patient reports that she is feeling better and the discomfort has improved but there is still one area that is still sore.  Reports that his bowel movements have been soft and he is not having to strain much at all and continues using stool softeners and MiraLAX.  Vital signs: BP (!) 142/95   Pulse 90   Temp 98.1 F (36.7 C) (Oral)   Ht '5\' 5"'$  (1.651 m)   Wt 147 lb (66.7 kg)   SpO2 97%   BMI 24.46 kg/m    Physical Exam: Constitutional: No acute distress Rectal: External exam reveals almost fully healed tissue except for one area in the right which still has some raw tissue.  This is the area of pain on palpation.  No pain in the other areas of the perianal region.  Assessment/Plan: This is a 61 y.o. male s/p EUA and hemorrhoidectomy  -- Patient actually continues to heal well now with only 1 small area of raw tissue which is almost healed.  There is the same area the patient has pain still so I think the pain is mostly related to the area that still needs to fully heal.  This will continue to improve. - Advised that he continue his stool softener and MiraLAX to continue avoiding constipation.  Continue using Anusol and lidocaine.  Can continue taking Tylenol.  At this point no further oxycodone is needed. - Follow-up as needed.   Melvyn Neth, [facility]

## 2021-10-08 NOTE — Patient Instructions (Addendum)
If you have any concerns or questions, please feel free to call our office.   Surgical Procedures for Hemorrhoids, Care After This sheet gives you information about how to care for yourself after your procedure. Your health care provider may also give you more specific instructions. If you have problems or questions, contact your health care provider. What can I expect after the procedure? After the procedure, it is common to have: Rectal pain. Pain when you are having a bowel movement. Slight rectal bleeding. This is more likely to happen with the first bowel movement after surgery. Follow these instructions at home: Medicines Take over-the-counter and prescription medicines only as told by your health care provider. If you were prescribed an antibiotic medicine, use it as told by your health care provider. Do not stop using the antibiotic even if your condition improves. Ask your health care provider if the medicine prescribed to you requires you to avoid driving or using heavy machinery. Use a stool softener or a bulk laxative as told by your health care provider. Eating and drinking Follow instructions from your health care provider about what to eat or drink after your procedure. You may need to take actions to prevent or treat constipation, such as: Drink enough fluid to keep your urine pale yellow. Take over-the-counter or prescription medicines. Eat foods that are high in fiber, such as beans, whole grains, and fresh fruits and vegetables. Limit foods that are high in fat and processed sugars, such as fried or sweet foods. Activity  Rest as told by your health care provider. Avoid sitting for a long time without moving. Get up to take short walks every 1-2 hours. This is important to improve blood flow and breathing. Ask for help if you feel weak or unsteady. Return to your normal activities as told by your health care provider. Ask your health care provider what activities are safe  for you. Do not lift anything that is heavier than 10 lb (4.5 kg), or the limit that you are told, until your health care provider says that it is safe. Do not strain to have a bowel movement. Do not spend a long time sitting on the toilet. General instructions  Take warm sitz baths for 15-20 minutes, 2-3 times a day to relieve soreness or itching and to keep the rectal area clean. Apply ice packs to the area to reduce swelling and pain. Do not drive for 24 hours if you were given a sedative during your procedure. Keep all follow-up visits as told by your health care provider. This is important. Contact a health care provider if: Your pain medicine is not helping. You have a fever or chills. You have bad smelling drainage. You have a lot of swelling. You become constipated. You have trouble passing urine. Get help right away if: You have very bad rectal pain. You have heavy bleeding from your rectum. Summary After the procedure, it is common to have pain and slight rectal bleeding. Take warm sitz baths for 15-20 minutes, 2-3 times a day to relieve soreness or itching and to keep the rectal area clean. Avoid straining when having a bowel movement. Eat foods that are high in fiber, such as beans, whole grains, and fresh fruits and vegetables. Take over-the-counter and prescription medicines only as told by your health care provider. This information is not intended to replace advice given to you by your health care provider. Make sure you discuss any questions you have with your health care provider. Document   Revised: 08/07/2020 Document Reviewed: 08/07/2020 Elsevier Patient Education  [place].

## 2021-10-08 NOTE — Progress Notes (Signed)
10/08/2021 9:32 AM   Alvin Gonzalez. 1960-05-06 [id number]  Referring provider: Langston Reusing, NP [place],  [place] [address]  Chief Complaint  Patient presents with   Benign Prostatic Hypertrophy    HPI: 61 y.o. presents for 1 month follow-up  Initially seen 09/05/2021 with history of urinary retention Only complaint at that visit was urinary urgency; PVR was ~ 400 mL Started on tamsulosin end presents for follow-up Does not feel his voiding pattern has significantly improved although PVR today was significant improved at 99 mL   PMH: Past Medical History:  Diagnosis Date   Adrenal insufficiency (HCC)    AKI (acute kidney injury) ([place])    Anxiety    Cancer ([place])    Chronic back pain    Chronic pain    Chronic prescription opiate use    Depression    Depression    GERD (gastroesophageal reflux disease)    Hemorrhoids    Hypertension    Hypokalemia    Intermittent explosive disorder    Marijuana use    PTSD (post-traumatic stress disorder)    Pulmonary nodule    Risk for falls    Suicide attempt ([place])    prior to 02/18/14 admission    Tobacco abuse     Surgical History: Past Surgical History:  Procedure Laterality Date   CHEST TUBE INSERTION     COLONOSCOPY WITH PROPOFOL N/A 05/14/2021   Procedure: COLONOSCOPY WITH PROPOFOL;  Surgeon: Jonathon Bellows, MD;  Location[facility] ENDOSCOPY;  Service: Gastroenterology;  Laterality: N/A;   EVALUATION UNDER ANESTHESIA WITH HEMORRHOIDECTOMY N/A 09/10/2021   Procedure: EXAM UNDER ANESTHESIA WITH HEMORRHOIDECTOMY of 2+ columns;  Surgeon: Olean Ree, MD;  Location: [facility] ORS;  Service: General;  Laterality: N/A;  Provider requesting 1.5 hours / 90 minutes for procedure.   LYMPH NODE BIOPSY      Home Medications:  Allergies as of 10/08/2021       Reactions   Carbamazepine Rash, Hives   Ibuprofen Nausea And Vomiting, Rash   GI Bleeding   Risperidone Swelling   Tongue swelling Tongue  swelling Tongue swelling Tongue swelling? Pt self report   Naproxen Other (See Comments)   stomach bleeding stomach bleeding stomach bleeding        Medication List        Accurate as of October 08, 2021  9:32 AM. If you have any questions, ask your nurse or doctor.          acetaminophen 500 MG tablet Commonly known as: TYLENOL Take 1,000 mg by mouth every 6 (six) hours as needed.   albuterol 108 (90 Base) MCG/ACT inhaler Commonly known as: VENTOLIN HFA Inhale 1 to 2 puffs by mouth every 6 hours as needed for wheezing or shortness of breath (Inhale 1-2 puffs into the lungs every 6 (six) hours as needed for wheezing or shortness of breath.) What changed: when to take this   amitriptyline 25 MG tablet Commonly known as: ELAVIL Take 1 tablet (25 mg total) by mouth at bedtime (Take 1 tablet (25 mg total) by mouth at bedtime for 30 days)   gabapentin 400 MG capsule Commonly known as: NEURONTIN Take 1 capsule (400 mg total) by mouth 3 (three) times daily.   lidocaine 5 % ointment Commonly known as: XYLOCAINE Apply to perianal area 4 times a day for mild to moderate pain (Apply 1 Application topically 4 (four) times daily as needed for mild pain or moderate pain. Apply to  perianal area)   lisinopril 30 MG tablet Commonly known as: ZESTRIL Take 1 tablet (30 mg total) by mouth daily. What changed: when to take this   oxyCODONE 5 MG immediate release tablet Commonly known as: Oxy IR/ROXICODONE Take 1 tablet (5 mg total) by mouth every 4 (four) hours as needed for severe pain.   pantoprazole 40 MG tablet Commonly known as: PROTONIX Take 1 tablet (40 mg total) by mouth once daily. What changed: when to take this   Procto-Med HC 2.5 % rectal cream Generic drug: hydrocortisone Place 1 Application rectally 2 (two) times daily.   tamsulosin 0.4 MG Caps capsule Commonly known as: FLOMAX Take 1 capsule (0.4 mg total) by mouth daily. What changed: when to take this         Allergies:  Allergies  Allergen Reactions   Carbamazepine Rash and Hives   Ibuprofen Nausea And Vomiting and Rash    GI Bleeding    Risperidone Swelling    Tongue swelling Tongue swelling Tongue swelling Tongue swelling? Pt self report   Naproxen Other (See Comments)    stomach bleeding stomach bleeding stomach bleeding    Family History: Family History  Problem Relation Age of Onset   Diabetes Mother    Hypertension Mother    Other Mother        possible ovarian cancer   Heart disease Father    Hypertension Father    Cirrhosis Father    Liver cancer Father    Lung cancer Sister    Heart attack Maternal Grandmother    Suicidality Maternal Grandfather    Other Paternal Grandmother        unknown medical history   Suicidality Paternal Grandfather    Stomach cancer Cousin     Social History:  reports that he has been smoking cigarettes. He has a 45.00 pack-year smoking history. He quit smokeless tobacco use about 6 years ago. He reports current alcohol use. He reports current drug use. Drug: Marijuana.   Physical Exam: BP 130/70   Pulse 74   Ht '5\' 5"'$  (1.651 m)   Wt 147 lb (66.7 kg)   BMI 24.46 kg/m   Constitutional:  Alert and oriented, No acute distress. HEENT: [place] AT Respiratory: Normal respiratory effort GI: Abdomen is soft, nontender, nondistended, no abdominal masses   Assessment & Plan:    1.  BPH with LUTS PVR today 99 mL Still having bothersome LUTS Increase tamsulosin 0.4 mg twice daily If no improvement he will follow-up to discuss surgical options If symptoms improve and he desires to continue medical management follow-up annually or as needed for worsening lower urinary tract symptoms     Abbie Sons, MD  [facility] [address] [place], [place] [address] [phone]

## 2021-10-13 ENCOUNTER — Encounter: Payer: Self-pay | Admitting: Urology

## 2021-10-22 ENCOUNTER — Other Ambulatory Visit: Payer: Self-pay

## 2021-10-22 MED ORDER — PREDNISONE 10 MG PO TABS
ORAL_TABLET | ORAL | 0 refills | Status: DC
  Filled 2021-10-22: qty 42, 12d supply, fill #0

## 2021-10-22 MED ORDER — METAXALONE 800 MG PO TABS
ORAL_TABLET | ORAL | 0 refills | Status: DC
  Filled 2021-10-22: qty 20, 6d supply, fill #0

## 2021-10-23 ENCOUNTER — Other Ambulatory Visit: Payer: Self-pay

## 2021-10-23 MED ORDER — DULOXETINE HCL 30 MG PO CPEP
ORAL_CAPSULE | ORAL | 1 refills | Status: DC
  Filled 2021-10-23: qty 60, 30d supply, fill #0

## 2021-11-05 ENCOUNTER — Other Ambulatory Visit: Payer: Self-pay

## 2021-11-06 ENCOUNTER — Other Ambulatory Visit: Payer: Self-pay

## 2021-11-11 ENCOUNTER — Other Ambulatory Visit: Payer: Self-pay

## 2021-11-11 MED ORDER — ALBUTEROL SULFATE HFA 108 (90 BASE) MCG/ACT IN AERS
INHALATION_SPRAY | RESPIRATORY_TRACT | 11 refills | Status: AC
  Filled 2021-11-11: qty 6.7, 25d supply, fill #0
  Filled 2022-01-12: qty 6.7, 25d supply, fill #1
  Filled 2022-02-11: qty 18, 25d supply, fill #2
  Filled 2022-03-13: qty 18, 25d supply, fill #3

## 2021-11-11 MED ORDER — PANTOPRAZOLE SODIUM 40 MG PO TBEC
DELAYED_RELEASE_TABLET | ORAL | 11 refills | Status: DC
  Filled 2021-11-11: qty 30, 30d supply, fill #0
  Filled 2021-12-09: qty 30, 30d supply, fill #1
  Filled 2022-01-12: qty 30, 30d supply, fill #2
  Filled 2022-02-11: qty 30, 30d supply, fill #3
  Filled 2022-03-13: qty 30, 30d supply, fill #4

## 2021-11-11 MED ORDER — AMLODIPINE BESYLATE 10 MG PO TABS
10.0000 mg | ORAL_TABLET | Freq: Every day | ORAL | 1 refills | Status: DC
  Filled 2021-11-11: qty 30, 30d supply, fill #0
  Filled 2021-12-09: qty 30, 30d supply, fill #1
  Filled 2022-01-12: qty 30, 30d supply, fill #2
  Filled 2022-02-11: qty 30, 30d supply, fill #3
  Filled 2022-03-13: qty 30, 30d supply, fill #4

## 2021-11-11 MED ORDER — AMITRIPTYLINE HCL 25 MG PO TABS
25.0000 mg | ORAL_TABLET | Freq: Every day | ORAL | 3 refills | Status: DC
  Filled 2021-11-11 – 2021-12-09 (×2): qty 30, 30d supply, fill #0
  Filled 2022-01-12: qty 30, 30d supply, fill #1
  Filled 2022-02-11: qty 30, 30d supply, fill #2
  Filled 2022-03-13: qty 30, 30d supply, fill #3

## 2021-11-11 MED ORDER — LISINOPRIL 40 MG PO TABS
40.0000 mg | ORAL_TABLET | Freq: Every day | ORAL | 1 refills | Status: DC
Start: 2021-11-11 — End: 2023-05-29
  Filled 2021-11-11: qty 30, 30d supply, fill #0
  Filled 2022-01-12: qty 30, 30d supply, fill #1

## 2021-12-09 ENCOUNTER — Other Ambulatory Visit: Payer: Self-pay

## 2021-12-09 MED ORDER — LISINOPRIL 30 MG PO TABS
30.0000 mg | ORAL_TABLET | Freq: Every day | ORAL | 0 refills | Status: DC
Start: 2021-12-09 — End: 2023-05-29
  Filled 2021-12-09: qty 30, 30d supply, fill #0

## 2021-12-10 ENCOUNTER — Other Ambulatory Visit: Payer: Self-pay

## 2021-12-12 ENCOUNTER — Other Ambulatory Visit: Payer: Self-pay

## 2021-12-12 MED ORDER — HYDROCHLOROTHIAZIDE 25 MG PO TABS
25.0000 mg | ORAL_TABLET | Freq: Every day | ORAL | 3 refills | Status: DC
Start: 2021-12-12 — End: 2023-05-29
  Filled 2021-12-12: qty 30, 30d supply, fill #0
  Filled 2022-01-12: qty 90, 90d supply, fill #0

## 2021-12-12 MED ORDER — LISINOPRIL 40 MG PO TABS
40.0000 mg | ORAL_TABLET | Freq: Every day | ORAL | 1 refills | Status: DC
  Filled 2021-12-12: qty 30, 30d supply, fill #0
  Filled 2022-02-11: qty 90, 90d supply, fill #0

## 2022-01-02 ENCOUNTER — Other Ambulatory Visit: Payer: Self-pay

## 2022-01-12 ENCOUNTER — Other Ambulatory Visit: Payer: Self-pay

## 2022-01-12 ENCOUNTER — Other Ambulatory Visit: Payer: Self-pay | Admitting: Gerontology

## 2022-01-12 DIAGNOSIS — G8929 Other chronic pain: Secondary | ICD-10-CM

## 2022-01-13 ENCOUNTER — Other Ambulatory Visit: Payer: Self-pay

## 2022-01-14 ENCOUNTER — Other Ambulatory Visit: Payer: Self-pay

## 2022-01-15 ENCOUNTER — Other Ambulatory Visit: Payer: Self-pay

## 2022-01-15 MED ORDER — AMOXICILLIN 500 MG PO CAPS
500.0000 mg | ORAL_CAPSULE | Freq: Three times a day (TID) | ORAL | 0 refills | Status: DC
  Filled 2022-01-15: qty 21, 7d supply, fill #0

## 2022-01-22 ENCOUNTER — Other Ambulatory Visit: Payer: Self-pay

## 2022-01-22 DIAGNOSIS — Z122 Encounter for screening for malignant neoplasm of respiratory organs: Secondary | ICD-10-CM

## 2022-01-22 DIAGNOSIS — Z87891 Personal history of nicotine dependence: Secondary | ICD-10-CM

## 2022-01-22 DIAGNOSIS — F17210 Nicotine dependence, cigarettes, uncomplicated: Secondary | ICD-10-CM

## 2022-02-11 ENCOUNTER — Other Ambulatory Visit: Payer: Self-pay

## 2022-03-05 ENCOUNTER — Telehealth: Payer: Self-pay

## 2022-03-05 NOTE — Telephone Encounter (Signed)
Insurance requested change in location of imaging facility from [facility] to [facility].  Unable to reach patient by phone for notification of change.  Phone gives a fast busy signal and no VM picks up.  Mailed letter to home today and also placed note on the Sdmv to notify patient in case he has not received his letter in the mail.  Appt has been changed to [facility] on same date/time 03/13/22 at 10am

## 2022-03-11 ENCOUNTER — Encounter: Payer: 59 | Admitting: Acute Care

## 2022-03-11 ENCOUNTER — Telehealth: Payer: Self-pay | Admitting: Acute Care

## 2022-03-11 NOTE — Telephone Encounter (Signed)
I have called the patient multiple times, and there is no answer. There is a fast busy signal . There is no other contact in either the [facility] or [facility] demographics. He has given no additional numbers as contacts. We will cancel the CT Screening scan scheduled for tomorrow as he was a no show for  his shared decision making visit today.  Langley Gauss, please cancel CT Scan , and see if we can get in touch with the patient to reschedule. We will need to let Dr. Ola Spurr know he was  no show if we continue to be unable to make contact with him. Thanks so much

## 2022-03-11 NOTE — Telephone Encounter (Signed)
LDCT canceled and SDMV change to no show.  Referral updated with current information regarding unable to reach patient for scheduled sdmv.

## 2022-03-13 ENCOUNTER — Ambulatory Visit: Payer: 59

## 2022-03-13 ENCOUNTER — Other Ambulatory Visit: Payer: Self-pay

## 2022-03-19 ENCOUNTER — Other Ambulatory Visit: Payer: Self-pay

## 2022-03-19 MED ORDER — AMOXICILLIN 500 MG PO CAPS
500.0000 mg | ORAL_CAPSULE | Freq: Three times a day (TID) | ORAL | 0 refills | Status: DC
  Filled 2022-03-19: qty 21, 7d supply, fill #0

## 2022-03-30 ENCOUNTER — Encounter: Payer: Self-pay | Admitting: Acute Care

## 2022-04-01 ENCOUNTER — Ambulatory Visit (INDEPENDENT_AMBULATORY_CARE_PROVIDER_SITE_OTHER): Payer: Medicaid Other | Admitting: Pulmonary Disease

## 2022-04-01 ENCOUNTER — Encounter: Payer: Self-pay | Admitting: Pulmonary Disease

## 2022-04-01 DIAGNOSIS — Z87891 Personal history of nicotine dependence: Secondary | ICD-10-CM

## 2022-04-01 DIAGNOSIS — Z122 Encounter for screening for malignant neoplasm of respiratory organs: Secondary | ICD-10-CM

## 2022-04-01 NOTE — Progress Notes (Signed)
04/01/22 - 1012am - called, no answer, unable to leave VM - VM full  04/01/22 - 1024am - called, person answers, doesn't identify themselves, NP identifies himself 3 times, no answer and then call was hung up    Shared Decision Making Visit Lung Cancer Screening Program [phone])

## 2022-04-02 ENCOUNTER — Ambulatory Visit: Payer: 59

## 2022-04-18 ENCOUNTER — Other Ambulatory Visit: Payer: Self-pay

## 2022-04-18 ENCOUNTER — Emergency Department
Admission: EM | Admit: 2022-04-18 | Discharge: 2022-04-18 | Disposition: A | Discharge status: Home / Self Care | Payer: Medicaid Other | Attending: Emergency Medicine | Admitting: Emergency Medicine

## 2022-04-18 DIAGNOSIS — F14921 Cocaine use, unspecified with intoxication delirium: Secondary | ICD-10-CM | POA: Insufficient documentation

## 2022-04-18 DIAGNOSIS — Z20822 Contact with and (suspected) exposure to covid-19: Secondary | ICD-10-CM | POA: Insufficient documentation

## 2022-04-18 DIAGNOSIS — F29 Unspecified psychosis not due to a substance or known physiological condition: Secondary | ICD-10-CM | POA: Insufficient documentation

## 2022-04-18 DIAGNOSIS — Z1152 Encounter for screening for COVID-19: Secondary | ICD-10-CM | POA: Diagnosis not present

## 2022-04-18 DIAGNOSIS — F1910 Other psychoactive substance abuse, uncomplicated: Secondary | ICD-10-CM | POA: Diagnosis present

## 2022-04-18 DIAGNOSIS — R442 Other hallucinations: Secondary | ICD-10-CM | POA: Diagnosis not present

## 2022-04-18 MED ORDER — ONDANSETRON 4 MG PO TBDP
4.0000 mg | ORAL_TABLET | Freq: Once | ORAL | Status: AC
  Administered 2022-04-18: 4 mg via ORAL
  Filled 2022-04-18: qty 1

## 2022-04-18 NOTE — ED Triage Notes (Signed)
Pt to ED from [facility] V[facility], bp 160/100, 99%, cbg 105  CC is pt drank one beer this AM and "I have worms crawling all over me" since this AM. States there was "something floating in my beer".   Pt is alert, oriented.. Pt endorses cocaine use also, last use yesterday. States did not sleep last night. Also states feels stuff crawling in his groin.  Pt has been scratching legs. EDP at bedside.

## 2022-04-18 NOTE — ED Triage Notes (Signed)
FIRST NURSE NOTE:  [facility] picked up off [place] was here  yesterday, pt reports today worms are eating at him and now reports there are worms in his vomit.  EMS did not see any vomit or worms. Pt has hx of cocaine use.   164/105 P-96   98% RA

## 2022-04-18 NOTE — ED Provider Notes (Signed)
[facility] Provider Note    Event Date/Time   First MD Initiated Contact with Patient 04/18/22 [phone]     (approximate)   History   Worms   HPI  Weslee Sill. is a 62 y.o. male who presents with complaints of a sensation of worms crawling on his skin.  Patient reports he was "having a beer "last night when he noticed some "white things "inside the beer.  He and his friend cut the can open and he describes that there were worms inside the beer.  Notably the patient does admit to using cocaine as well.  Shortly after discovering the worms inside the beer he felt like he had worms on his skin and shows me several places where he was itching.  No chest pain, no shortness of breath     Physical Exam   Triage Vital Signs: ED Triage Vitals  Enc Vitals Group     BP 04/18/22 0709 (!) 157/86     Pulse Rate 04/18/22 0709 82     Resp 04/18/22 0709 15     Temp 04/18/22 0709 98.1 F (36.7 C)     Temp Source 04/18/22 0709 Oral     SpO2 04/18/22 0709 95 %     Weight 04/18/22 0710 59 kg (130 lb)     Height 04/18/22 0710 1.651 m ('5\' 5"'$ )     Head Circumference --      Peak Flow --      Pain Score 04/18/22 0710 0     Pain Loc --      Pain Edu? --      Excl. in [place]? --     Most recent vital signs: Vitals:   04/18/22 0800 04/18/22 0900  BP: 130/85 (!) 141/76  Pulse: 73 73  Resp:  14  Temp:    SpO2: 96% 96%     General: Awake, no distress.  CV:  Good peripheral perfusion.  Regular rate and rhythm Resp:  Normal effort.  Abd:  No distention.  Soft, nontender Other:  Area on the right thigh where the patient has been scratching which appears to be a bug bite   ED Results / Procedures / Treatments   Labs (all labs ordered are listed, but only abnormal results are displayed) Labs Reviewed - No data to display   EKG     RADIOLOGY     PROCEDURES:  Critical Care performed:   Procedures   MEDICATIONS ORDERED IN ED: Medications   ondansetron (ZOFRAN-ODT) disintegrating tablet 4 mg (4 mg Oral Given 04/18/22 0726)     IMPRESSION / MDM / [place] / ED COURSE  I reviewed the triage vital signs and the nursing notes. Patient's presentation is most consistent with acute illness / injury with system symptoms.  Patient presents with sensation of worms crawling on him.  My suspicion is that this is a result of alcohol and cocaine usage.  He does not appear to be withdrawing from alcohol or other substances, his vital signs are overall reassuring.  He is generally well-appearing and is answering questions appropriately.  Reassured him that I do not see any worms and that this scenario is very unlikely and likely related to substance abuse  He is concerned that he may have worms in his stomach because he feels a little queasy, will give ODT Zofran  Patient does not appear psychotic, he is answering questions appropriately and overall lucid besides being concerned about being infected  by worms found in his beer can.  This is likely to be related to substance abuse as opposed to an underlying psychiatric illness  Will observe the patient in the emergency department  ----------------------------------------- 9:49 AM on 04/18/2022 ----------------------------------------- Patient has slept, he is less convinced that he has worms in his body now, no indication for further workup, no indication for psychiatric evaluation at this time, appropriate for discharge      FINAL CLINICAL IMPRESSION(S) / ED DIAGNOSES   Final diagnoses:  Substance abuse ([place])     Rx / DC Orders   ED Discharge Orders     None        Note:  This document was prepared using Dragon voice recognition software and may include unintentional dictation errors.   Lavonia Drafts, MD 04/18/22 [phone]

## 2022-04-18 NOTE — ED Notes (Signed)
Tried to call only number listed for ride home. Phone disconnected.

## 2022-04-18 NOTE — ED Notes (Signed)
Pt resting comfortably in bed with lights dimmed and blanket. No complaints at this time.

## 2022-04-19 ENCOUNTER — Emergency Department: Payer: Medicaid Other

## 2022-04-19 ENCOUNTER — Other Ambulatory Visit: Payer: Self-pay

## 2022-04-19 ENCOUNTER — Encounter: Payer: Self-pay | Admitting: Emergency Medicine

## 2022-04-19 ENCOUNTER — Emergency Department (EMERGENCY_DEPARTMENT_HOSPITAL)
Admission: EM | Admit: 2022-04-19 | Discharge: 2022-04-19 | Disposition: A | Discharge status: Home / Self Care | Payer: Medicaid Other | Source: Home / Self Care | Attending: Emergency Medicine | Admitting: Emergency Medicine

## 2022-04-19 DIAGNOSIS — R442 Other hallucinations: Secondary | ICD-10-CM

## 2022-04-19 DIAGNOSIS — R441 Visual hallucinations: Secondary | ICD-10-CM | POA: Diagnosis present

## 2022-04-19 DIAGNOSIS — R443 Hallucinations, unspecified: Secondary | ICD-10-CM

## 2022-04-19 DIAGNOSIS — F1490 Cocaine use, unspecified, uncomplicated: Secondary | ICD-10-CM | POA: Diagnosis present

## 2022-04-19 HISTORY — DX: Other hallucinations: R44.2

## 2022-04-19 HISTORY — DX: Visual hallucinations: R44.1

## 2022-04-19 LAB — CBC WITH DIFFERENTIAL/PLATELET
Abs Immature Granulocytes: 0.01 10*3/uL (ref 0.00–0.07)
Basophils Absolute: 0.1 10*3/uL (ref 0.0–0.1)
Basophils Relative: 1 %
Eosinophils Absolute: 0.1 10*3/uL (ref 0.0–0.5)
Eosinophils Relative: 1 %
HCT: 43.5 % (ref 39.0–52.0)
Hemoglobin: 14.5 g/dL (ref 13.0–17.0)
Immature Granulocytes: 0 %
Lymphocytes Relative: 24 %
Lymphs Abs: 1.9 10*3/uL (ref 0.7–4.0)
MCH: 30.1 pg (ref 26.0–34.0)
MCHC: 33.3 g/dL (ref 30.0–36.0)
MCV: 90.2 fL (ref 80.0–100.0)
Monocytes Absolute: 0.7 10*3/uL (ref 0.1–1.0)
Monocytes Relative: 9 %
Neutro Abs: 5.3 10*3/uL (ref 1.7–7.7)
Neutrophils Relative %: 65 %
Platelets: 317 10*3/uL (ref 150–400)
RBC: 4.82 MIL/uL (ref 4.22–5.81)
RDW: 13.2 % (ref 11.5–15.5)
WBC: 8.1 10*3/uL (ref 4.0–10.5)
nRBC: 0 % (ref 0.0–0.2)

## 2022-04-19 LAB — COMPREHENSIVE METABOLIC PANEL
ALT: 15 U/L (ref 0–44)
AST: 24 U/L (ref 15–41)
Albumin: 4.4 g/dL (ref 3.5–5.0)
Alkaline Phosphatase: 71 U/L (ref 38–126)
Anion gap: 9 (ref 5–15)
BUN: 14 mg/dL (ref 6–20)
CO2: 27 mmol/L (ref 22–32)
Calcium: 9.6 mg/dL (ref 8.9–10.3)
Chloride: 106 mmol/L (ref 98–111)
Creatinine, Ser: 0.88 mg/dL (ref 0.61–1.24)
GFR, Estimated: >60 mL/min (ref >60)
Glucose, Bld: 102 mg/dL — ABNORMAL HIGH (ref 70–99)
Potassium: 3.4 mmol/L — ABNORMAL LOW (ref 3.5–5.1)
Sodium: 142 mmol/L (ref 135–145)
Total Bilirubin: 0.9 mg/dL (ref 0.3–1.2)
Total Protein: 7.5 g/dL (ref 6.5–8.1)

## 2022-04-19 LAB — RESP PANEL BY RT-PCR (RSV, FLU A&B, COVID)  RVPGX2
Influenza A by PCR: NEGATIVE
Influenza B by PCR: NEGATIVE
Resp Syncytial Virus by PCR: NEGATIVE
SARS Coronavirus 2 by RT PCR: NEGATIVE

## 2022-04-19 LAB — ACETAMINOPHEN LEVEL: Acetaminophen (Tylenol), Serum: <10 ug/mL — ABNORMAL LOW (ref 10–30)

## 2022-04-19 LAB — ETHANOL: Alcohol, Ethyl (B): <10 mg/dL (ref <10)

## 2022-04-19 LAB — SALICYLATE LEVEL: Salicylate Lvl: <7 mg/dL — ABNORMAL LOW (ref 7.0–30.0)

## 2022-04-19 MED ORDER — LORAZEPAM 0.5 MG PO TABS
0.5000 mg | ORAL_TABLET | Freq: Once | ORAL | Status: AC
  Administered 2022-04-19: 0.5 mg via ORAL
  Filled 2022-04-19: qty 1

## 2022-04-19 NOTE — ED Notes (Signed)
Psychiatry at bedside.

## 2022-04-19 NOTE — ED Notes (Signed)
Patient advised that we need urine specimen; patient unwilling to go at this time, states, "I haven't peed all day."  Patient provided cup of water.

## 2022-04-19 NOTE — ED Notes (Signed)
Patient transported to CT 

## 2022-04-19 NOTE — BH Assessment (Signed)
Comprehensive Clinical Assessment (CCA) Screening, Triage and Referral Note  04/19/2022 [place] [id number] Recommendations for Services/Supports/Treatments: Consulted with Lynder Parents., NP, who determined pt. does not meet inpatient criteria. TTS to follow up to reassess pt's desire for substance abuse treatment.  Wallace Keller Jirah Doege is a 62 y.o., Caucasian, Not Hispanic or Latino ethnicity, ENGLISH speaking male with a hx of depression, anxiety, and PTSD. Per triage note: [facility] up off [place] was here yesterday, pt. reports today worms are eating at him and now reports there are worms in his vomit. EMS did not see any vomit or worms.  Upon assessment, pt. continued report that he has tactile and visual hallucinations that are distressing. When asked why he'd presented to the ER pt. stated, "I feel like I got worms all over me". Pt explained that he feels the worms all over him and that he can see them, describing the worms as clear. The patient was calm and cooperative. Pt was drowsy, but was alert enough to awaken. Pt admitted to drinking beer and occasionally using cocaine. Pt had poor insight. The pt declined substance abuse treatment.  Pt had slowed psychomotor activity. The pt denied SI, HI.   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 Self  What Is the Reason for Your Visit/Call Today? [facility] up off [place] was here  yesterday, pt reports today worms are eating at him and now reports there are worms in his vomit.  How Long Has This Been Causing You Problems? <Week  What Do You Feel Would Help You the Most Today? Stress Management   Have You Recently Had Any Thoughts About Hurting Yourself? No  Are You Planning to Commit Suicide/Harm Yourself At This time?  No   Have you Recently Had Thoughts About [place]? No  Are You Planning to Harm Someone at This Time? No  Explanation: No data recorded  Have You Used Any Alcohol or Drugs in the Past 24 Hours? No  How Long Ago Did You Use Drugs or Alcohol? No data recorded What Did You Use and How Much? n/a   Do You Currently Have a Therapist/Psychiatrist? No  Name of Therapist/Psychiatrist: n/a   Have You Been Recently Discharged From Any Office Practice or Programs? No  Explanation of Discharge From Practice/Program: n/a    CCA Screening Triage Referral Assessment Type of Contact: Face-to-Face  Telemedicine Service Delivery:   Is this Initial or Reassessment?   Date Telepsych consult ordered in [facility]:    Time Telepsych consult ordered in [facility]:    Location of Assessment: [facility] ED  Provider Location: [facility] ED    Collateral Involvement: None provided   Does Patient Have a [place]? No data recorded Name and Contact of Legal Guardian: No data recorded If Minor and Not Living with Parent(s), Who has Custody? n/a  Is CPS involved or ever been involved? Never  Is APS involved or ever been involved? Never   Patient Determined To Be At Risk for Harm To Self or Others Based on Review of Patient Reported Information or Presenting Complaint? No  Method: No Plan  Availability of Means: No access or NA  Intent: Vague intent or NA  Notification Required: No need or identified person  Additional Information for Danger to  Others Potential: Active psychosis  Additional Comments for Danger to Others Potential: No data recorded Are There Guns or Other Weapons in [place]? -- (UTA)  Types of Guns/Weapons: UTA  Are These Weapons Safely Secured?                            -- (UTA)  Who Could Verify You Are Able To Have These Secured: UTA  Do You Have any Outstanding Charges, Pending Court Dates, Parole/Probation? None reported  Contacted To Inform of  Risk of Harm To Self or Others: Other: Comment   Does Patient Present under Involuntary Commitment? No    [place] of Residence: [place]   Patient Currently Receiving the Following Services: Not Receiving Services   Determination of Need: Emergent (2 hours)   Options For Referral: ED Visit; Therapeutic Triage Services   Discharge Disposition:     Kathi Ludwig, LCAS

## 2022-04-19 NOTE — ED Provider Notes (Addendum)
Procedures     ----------------------------------------- 9:07 AM on 04/19/2022 -----------------------------------------  Patient now awake, eating and drinking, sitting upright.  Clear speech.  He is completely lucid, oriented.  No SI or HI or auditory hallucinations.  No current visual hallucinations.  At this point he is clear that his symptoms consist of formication that started immediately after intranasal cocaine use.  His thought process is measured, linear.  Not tangential or flighty.  Affect is normal.  Nothing is having an acute psychiatric crisis such as psychosis.  He is not manic.  He has medical decision-making capacity.  I do not think he requires a psychiatry consult at this time.  Stable for discharge    Carrie Mew, MD 04/19/22 [id number]    Carrie Mew, MD 04/19/22 [phone]

## 2022-04-19 NOTE — ED Triage Notes (Signed)
Pt arrived via [facility] with reports vomiting worms and reports worms are crawling over him.  Pt has minimal amount of emesis in emesis bag that is clear with what appears to be undigested food.  Pt states he was here for the same yesterday.  Denies any alcohol and drug use.    Pt denies any SI/HI.

## 2022-04-19 NOTE — ED Provider Notes (Signed)
[facility] Provider Note    Event Date/Time   First MD Initiated Contact with Patient 04/19/22 0034     (approximate)   History   Psychiatric Evaluation   HPI  Alvin Nicholes. is a 62 y.o. male who comes in with concerns for a sensation of worms calling all over himself.  Patient reports he has no prior history of this happening.  He states that he did use cocaine and drink alcohol yesterday but he denies using anything today and he reports having continued sensation of feeling worms are all over him as well as in his vomit.  He states that they did not do anything to evaluate him yesterday for it.  He states that he does not have any SI but he is just really worried about all the worms that are on him.  He denies any falls or hitting his head.  He denies making any attempts or thoughts to try to cut himself or harm himself to take the worms out.  On review of records patient was seen yesterday for similar and was observed in the ER and was improving on examination so was discharged given did not feel he needs psychiatric evaluation   Physical Exam   Triage Vital Signs: ED Triage Vitals  Enc Vitals Group     BP 04/19/22 0033 133/84     Pulse Rate 04/19/22 0033 82     Resp 04/19/22 0033 18     Temp 04/19/22 0033 97.9 F (36.6 C)     Temp Source 04/19/22 0033 Oral     SpO2 04/19/22 0033 95 %     Weight 04/19/22 0002 130 lb (59 kg)     Height 04/19/22 0002 '5\' 5"'$  (1.651 m)     Head Circumference --      Peak Flow --      Pain Score 04/19/22 0002 0     Pain Loc --      Pain Edu? --      Excl. in [place]? --     Most recent vital signs: Vitals:   04/19/22 0033  BP: 133/84  Pulse: 82  Resp: 18  Temp: 97.9 F (36.6 C)  SpO2: 95%     General: Awake, no distress.  CV:  Good peripheral perfusion.  Resp:  Normal effort.  Abd:  No distention.  Other:  No visualization of worms noted on him.  No visualization of worms on the vomit.  No hematoma  noted to the head   ED Results / Procedures / Treatments   Labs (all labs ordered are listed, but only abnormal results are displayed) Labs Reviewed  COMPREHENSIVE METABOLIC PANEL - Abnormal; Notable for the following components:      Result Value   Potassium 3.4 (*)    Glucose, Bld 102 (*)    All other components within normal limits  SALICYLATE LEVEL - Abnormal; Notable for the following components:   Salicylate Lvl [id number] (*)    All other components within normal limits  ACETAMINOPHEN LEVEL - Abnormal; Notable for the following components:   Acetaminophen (Tylenol), Serum <10 (*)    All other components within normal limits  RESP PANEL BY RT-PCR (RSV, FLU A&B, COVID)  [id number]  ETHANOL  CBC WITH DIFFERENTIAL/PLATELET  URINE DRUG SCREEN, QUALITATIVE ([facility] ONLY)  URINALYSIS, ROUTINE W REFLEX MICROSCOPIC      RADIOLOGY I have reviewed the CT had personally interpreted and no evidence of intracranial hemorrhage PROCEDURES:  Critical  Care performed: No  Procedures   MEDICATIONS ORDERED IN ED: Medications - No data to display   IMPRESSION / MDM / [place] / ED COURSE  I reviewed the triage vital signs and the nursing notes.   Patient's presentation is most consistent with acute presentation with potential threat to life or bodily function.   Patient comes in voluntary with concerns for bugs all over him.  No blood work was done yesterday.  Will check labs to make sure no medical cause of symptoms such as low sodium and check CT head.  Will consult psychiatry. Could be substance induced put pt denies any new substance over 24 hours. Does not appear to be in active etoh  withdraw  normal vitals/ no tremors. Will trial some ativan.   COVID test is negative.  CMP reassuring.  Alcohol negative.  CBC normal salicylate Tylenol are negative.   Pt is without any acute medical complaints. No exam findings to suggest medical cause of current presentation. Will order  psychiatric screening labs and discuss further w/ psychiatric service.  At this time patient is voluntary does not meet IVC criteria he denies any SI  D/d includes but is not limited to psychiatric disease, behavioral/personality disorder, inadequate socioeconomic support, medical.  Based on HPI, exam, unremarkable labs, no concern for acute medical problem at this time. No rigidity, clonus, hyperthermia, focal neurologic deficit, diaphoresis, tachycardia, meningismus, ataxia, gait abnormality or other finding to suggest this visit represents a non-psychiatric problem. Screening labs reviewed.    Given this, pt medically cleared, to be dispositioned per Psych.    The patient has been placed in psychiatric observation due to the need to provide a safe environment for the patient while obtaining psychiatric consultation and evaluation, as well as ongoing medical and medication management to treat the patient's condition.  The patient has not been placed under full IVC at this time.    FINAL CLINICAL IMPRESSION(S) / ED DIAGNOSES   Final diagnoses:  Hallucinations     Rx / DC Orders   ED Discharge Orders     None        Note:  This document was prepared using Dragon voice recognition software and may include unintentional dictation errors.   Vanessa Merigold, MD 04/19/22 [phone]

## 2022-04-19 NOTE — Consult Note (Signed)
[place] [facility]iatry Consult   Reason for Consult:Psychiatric Evaluation   Referring Physician: Dr. Jari Pigg Patient Identification: Alvin Gonzalez. MRN:  [id number] Principal Diagnosis: <principal problem not specified> Diagnosis:  Active Problems:   Visual hallucination   Tactile hallucination   Cocaine use   Total Time spent with patient: 1 hour  Subjective: "The worms are clear,but I see it."   Alvin Gonzalez. is a 62 y.o. male patient presented to [facility]. Based on the patient's note provided, it appears that the patient presented voluntarily to the emergency department (ED) via [facility]) reporting symptoms of vomiting worms and feeling worms crawling over him. Despite minimal emesis observed in the emesis bag, the patient insists on experiencing these hallucinations. The patient denies alcohol and drug use initially but later admitted to cocaine use, both the day before and during the assessment.  During evaluation, the patient was alert, oriented x4, calm but still voicing active hallucinations. He was cooperative and mood-congruent with affect. The patient appeared to be responding to internal and external stimuli and presented with some delusional thinking. He denied auditory hallucinations but reported experiencing visual and tactile hallucinations. The patient denied any suicidal, homicidal, or self-harm ideations and did not exhibit psychotic behaviors but did display some paranoia. Despite these symptoms, the patient was able to answer questions appropriately.  After consultation with Dr. Jari Pigg, it was determined that the patient does not meet the criteria for admission to the psychiatric inpatient unit. However, the presence of active hallucinations and delusional thinking warrants further evaluation and intervention. The patient substance used was addressed. Which the patient refused rehab for his substance use.   HPI: Per Dr.  Jari Pigg, Alvin Gonzalez. is a 62 y.o. male who comes in with concerns for a sensation of worms calling all over himself.  Patient reports he has no prior history of this happening.  He states that he did use cocaine and drink alcohol yesterday but he denies using anything today and he reports having continued sensation of feeling worms are all over him as well as in his vomit.  He states that they did not do anything to evaluate him yesterday for it.  He states that he does not have any SI but he is just really worried about all the worms that are on him.  He denies any falls or hitting his head.  He denies making any attempts or thoughts to try to cut himself or harm himself to take the worms out.   On review of records patient was seen yesterday for similar and was observed in the ER and was improving on examination so was discharged given did not feel he needs psychiatric evaluation.   Past Psychiatric History:  Anxiety Chronic prescription opiate use Depression Intermittent explosive disorder Marijuana use PTSD (post-traumatic stress disorder) Suicide attempt ([place]) Tobacco abuse   Risk to Self:   Risk to Others:   Prior Inpatient Therapy:   Prior Outpatient Therapy:    Past Medical History:  Past Medical History:  Diagnosis Date   Adrenal insufficiency ([place])    AKI (acute kidney injury) ([place])    Anxiety    Cancer ([place])    Chronic back pain    Chronic pain    Chronic prescription opiate use    Depression    Depression    GERD (gastroesophageal reflux disease)    Hemorrhoids    Hypertension    Hypokalemia    Intermittent explosive disorder  Marijuana use    PTSD (post-traumatic stress disorder)    Pulmonary nodule    Risk for falls    Suicide attempt [facility])    prior to 02/18/14 admission    Tobacco abuse     Past Surgical History:  Procedure Laterality Date   CHEST TUBE INSERTION     COLONOSCOPY WITH PROPOFOL N/A 05/14/2021   Procedure: COLONOSCOPY WITH PROPOFOL;   Surgeon: Jonathon Bellows, MD;  Location: [facility] ENDOSCOPY;  Service: Gastroenterology;  Laterality: N/A;   EVALUATION UNDER ANESTHESIA WITH HEMORRHOIDECTOMY N/A 09/10/2021   Procedure: EXAM UNDER ANESTHESIA WITH HEMORRHOIDECTOMY of 2+ columns;  Surgeon: Olean Ree, MD;  Location: [facility] ORS;  Service: General;  Laterality: N/A;  Provider requesting 1.5 hours / 90 minutes for procedure.   LYMPH NODE BIOPSY     Family History:  Family History  Problem Relation Age of Onset   Diabetes Mother    Hypertension Mother    Other Mother        possible ovarian cancer   Heart disease Father    Hypertension Father    Cirrhosis Father    Liver cancer Father    Lung cancer Sister    Heart attack Maternal Grandmother    Suicidality Maternal Grandfather    Other Paternal Grandmother        unknown medical history   Suicidality Paternal Grandfather    Stomach cancer Cousin    Family Psychiatric  History:  Suicidally-Maternal Grandfather Suicidally- Paternal Grandfather  Social History:  Social History   Substance and Sexual Activity  Alcohol Use Yes   Comment: occ     Social History   Substance and Sexual Activity  Drug Use Yes   Types: Marijuana, Cocaine   Comment: special occasions , last use 05/19/21. uses cocaine, last use 04/17/22    Social History   Socioeconomic History   Marital status: Legally Separated    Spouse name: Not on file   Number of children: Not on file   Years of education: Not on file   Highest education level: Not on file  Occupational History   Not on file  Tobacco Use   Smoking status: Every Day    Packs/day: 1.00    Years: 45.00    Total pack years: 45.00    Types: Cigarettes   Smokeless tobacco: Former    Quit date: 01/30/2015  Vaping Use   Vaping Use: Former   Quit date: 01/23/2017  Substance and Sexual Activity   Alcohol use: Yes    Comment: occ   Drug use: Yes    Types: Marijuana, Cocaine    Comment: special occasions , last use 05/19/21. uses  cocaine, last use 04/17/22   Sexual activity: Yes    Birth control/protection: None  Other Topics Concern   Not on file  Social History Narrative   Not on file   Social Determinants of Health   Financial Resource Strain: Not on file  Food Insecurity: No Food Insecurity (01/23/2021)   Hunger Vital Sign    Worried About Running Out of Food in the Last Year: Never true    Ran Out of Food in the Last Year: Never true  Transportation Needs: No Transportation Needs (01/23/2021)   PRAPARE - Hydrologist (Medical): No    Lack of Transportation (Non-Medical): No  Physical Activity: Not on file  Stress: Not on file  Social Connections: Not on file   Additional Social History:    Allergies:  Allergies  Allergen Reactions   Carbamazepine Rash and Hives   Ibuprofen Nausea And Vomiting and Rash    GI Bleeding    Risperidone Swelling    Tongue swelling Tongue swelling Tongue swelling Tongue swelling? Pt self report   Naproxen Other (See Comments)    stomach bleeding stomach bleeding stomach bleeding    Labs:  Results for orders placed or performed during the hospital encounter of 04/19/22 (from the past 48 hour(s))  Comprehensive metabolic panel     Status: Abnormal   Collection Time: 04/19/22  1:02 AM  Result Value Ref Range   Sodium 142 135 - 145 mmol/L   Potassium 3.4 (L) 3.5 - 5.1 mmol/L   Chloride 106 98 - 111 mmol/L   CO2 27 22 - 32 mmol/L   Glucose, Bld 102 (H) 70 - 99 mg/dL    Comment: Glucose reference range applies only to samples taken after fasting for at least 8 hours.   BUN 14 6 - 20 mg/dL   Creatinine, Ser 0.88 0.61 - 1.24 mg/dL   Calcium 9.6 8.9 - 10.3 mg/dL   Total Protein 7.5 6.5 - 8.1 g/dL   Albumin 4.4 3.5 - 5.0 g/dL   AST 24 15 - 41 U/L   ALT 15 0 - 44 U/L   Alkaline Phosphatase 71 38 - 126 U/L   Total Bilirubin 0.9 0.3 - 1.2 mg/dL   GFR, Estimated >60 >60 mL/min    Comment: (NOTE) Calculated using the CKD-EPI  Creatinine Equation (2021)    Anion gap 9 5 - 15    Comment: Performed at [facility], [address] [place], [place] [address]  Ethanol     Status: None   Collection Time: 04/19/22  1:02 AM  Result Value Ref Range   Alcohol, Ethyl (B) <10 <10 mg/dL    Comment: (NOTE) Lowest detectable limit for serum alcohol is 10 mg/dL.  For medical purposes only. Performed at [facility], [address] [place], [place] [address]   CBC with Diff     Status: None   Collection Time: 04/19/22  1:02 AM  Result Value Ref Range   WBC 8.1 4.0 - 10.5 K/uL   RBC 4.82 4.22 - 5.81 MIL/uL   Hemoglobin 14.5 13.0 - 17.0 g/dL   HCT 43.5 39.0 - 52.0 %   MCV 90.2 80.0 - 100.0 fL   MCH 30.1 26.0 - 34.0 pg   MCHC 33.3 30.0 - 36.0 g/dL   RDW 13.2 11.5 - 15.5 %   Platelets 317 150 - 400 K/uL   nRBC 0.0 0.0 - 0.2 %   Neutrophils Relative % 65 %   Neutro Abs 5.3 1.7 - 7.7 K/uL   Lymphocytes Relative 24 %   Lymphs Abs 1.9 0.7 - 4.0 K/uL   Monocytes Relative 9 %   Monocytes Absolute 0.7 0.1 - 1.0 K/uL   Eosinophils Relative 1 %   Eosinophils Absolute 0.1 0.0 - 0.5 K/uL   Basophils Relative 1 %   Basophils Absolute 0.1 0.0 - 0.1 K/uL   Immature Granulocytes 0 %   Abs Immature Granulocytes 0.01 0.00 - 0.07 K/uL    Comment: Performed at [facility], [address] [place], [place] [id number]  Salicylate level     Status: Abnormal   Collection Time: 04/19/22  1:02 AM  Result Value Ref Range   Salicylate Lvl Q000111Q (L) 7.0 - 30.0 mg/dL    Comment: Performed at [facility], [address] [place], [place] [address]  Acetaminophen level     Status: Abnormal   Collection Time: 04/19/22  1:02 AM  Result Value Ref Range   Acetaminophen (Tylenol), Serum <10 (L) 10 - 30 ug/mL    Comment: (NOTE) Therapeutic concentrations vary significantly. A range of 10-30 ug/mL  may be an effective concentration for many patients. However, some  are best treated at concentrations  outside of this range. Acetaminophen concentrations >150 ug/mL at 4 hours after ingestion  and >50 ug/mL at 12 hours after ingestion are often associated with  toxic reactions.  Performed at [facility], [address] [place], [place] [address]   Resp panel by RT-PCR (RSV, Flu A&B, Covid) Anterior Nasal Swab     Status: None   Collection Time: 04/19/22  1:02 AM   Specimen: Anterior Nasal Swab  Result Value Ref Range   SARS Coronavirus 2 by RT PCR NEGATIVE NEGATIVE    Comment: (NOTE) SARS-CoV-2 target nucleic acids are NOT DETECTED.  The SARS-CoV-2 RNA is generally detectable in upper respiratory specimens during the acute phase of infection. The lowest concentration of SARS-CoV-2 viral copies this assay can detect is 138 copies/mL. A negative result does not preclude SARS-Cov-2 infection and should not be used as the sole basis for treatment or other patient management decisions. A negative result may occur with  improper specimen collection/handling, submission of specimen other than nasopharyngeal swab, presence of viral mutation(s) within the areas targeted by this assay, and inadequate number of viral copies(<138 copies/mL). A negative result must be combined with clinical observations, patient history, and epidemiological information. The expected result is Negative.  Fact Sheet for Patients:  EntrepreneurPulse.com.au  Fact Sheet for Healthcare Providers:  IncredibleEmployment.be  This test is no t yet approved or cleared by the [place] FDA and  has been authorized for detection and/or diagnosis of SARS-CoV-2 by FDA under an Emergency Use Authorization (EUA). This EUA will remain  in effect (meaning this test can be used) for the duration of the COVID-19 declaration under Section 564(b)(1) of the Act, 21 U.S.C.section 360bbb-3(b)(1), unless the authorization is terminated  or revoked sooner.       Influenza A by  PCR NEGATIVE NEGATIVE   Influenza B by PCR NEGATIVE NEGATIVE    Comment: (NOTE) The Xpert Xpress SARS-CoV-2/FLU/RSV plus assay is intended as an aid in the diagnosis of influenza from Nasopharyngeal swab specimens and should not be used as a sole basis for treatment. Nasal washings and aspirates are unacceptable for Xpert Xpress SARS-CoV-2/FLU/RSV testing.  Fact Sheet for Patients: EntrepreneurPulse.com.au  Fact Sheet for Healthcare Providers: IncredibleEmployment.be  This test is not yet approved or cleared by the [place] FDA and has been authorized for detection and/or diagnosis of SARS-CoV-2 by FDA under an Emergency Use Authorization (EUA). This EUA will remain in effect (meaning this test can be used) for the duration of the COVID-19 declaration under Section 564(b)(1) of the Act, 21 U.S.C. section 360bbb-3(b)(1), unless the authorization is terminated or revoked.     Resp Syncytial Virus by PCR NEGATIVE NEGATIVE    Comment: (NOTE) Fact Sheet for Patients: EntrepreneurPulse.com.au  Fact Sheet for Healthcare Providers: IncredibleEmployment.be  This test is not yet approved or cleared by the [place] FDA and has been authorized for detection and/or diagnosis of SARS-CoV-2 by FDA under an Emergency Use Authorization (EUA). This EUA will remain in effect (meaning this test can be used) for the duration of the COVID-19 declaration under Section 564(b)(1) of  the Act, 21 U.S.C. section 360bbb-3(b)(1), unless the authorization is terminated or revoked.  Performed at [facility], [address] [place], [place] [address]     No current facility-administered medications for this encounter.   Current Outpatient Medications  Medication Sig Dispense Refill   acetaminophen (TYLENOL) 500 MG tablet Take 1,000 mg by mouth every 6 (six) hours as needed.     albuterol (VENTOLIN HFA) 108  (90 Base) MCG/ACT inhaler Inhale 1-2 puffs into the lungs every 6 (six) hours as needed for wheezing or shortness of breath. (Patient taking differently: Inhale 1-2 puffs into the lungs 3 (three) times daily.) 6.7 g 0   albuterol (VENTOLIN HFA) 108 (90 Base) MCG/ACT inhaler Inhale into the lungs.     albuterol (VENTOLIN HFA) 108 (90 Base) MCG/ACT inhaler Inhale 2 inhalations into the lungs every 6 (six) hours as needed 6.7 g 11   amitriptyline (ELAVIL) 25 MG tablet Take 1 tablet (25 mg total) by mouth at bedtime 30 tablet 3   amLODipine (NORVASC) 10 MG tablet Take 1 tablet (10 mg total) by mouth daily. 90 tablet 1   amoxicillin (AMOXIL) 500 MG capsule Take 1 capsule (500 mg total) by mouth 3 (three) times daily for 7 days. 21 capsule 0   gabapentin (NEURONTIN) 400 MG capsule Take 1 capsule (400 mg total) by mouth 3 (three) times daily. 90 capsule 0   hydrochlorothiazide (HYDRODIURIL) 25 MG tablet Take 1 tablet (25 mg total) by mouth once daily 90 tablet 3   hydrocortisone (ANUSOL-HC) 2.5 % rectal cream Place 1 Application rectally 2 (two) times daily. 28 g 0   lidocaine (XYLOCAINE) 5 % ointment Apply 1 Application topically 4 (four) times daily as needed for mild pain or moderate pain. Apply to perianal area 50 g 0   lisinopril (ZESTRIL) 30 MG tablet Take 1 tablet (30 mg total) by mouth daily. 30 tablet 0   lisinopril (ZESTRIL) 40 MG tablet Take 1 tablet (40 mg total) by mouth daily. 90 tablet 1   lisinopril (ZESTRIL) 40 MG tablet Take 1 tablet (40 mg total) by mouth daily. 90 tablet 1   metaxalone (SKELAXIN) 800 MG tablet Take 1 tablet (800 mg total) by mouth 3 (three) times daily as needed for Pain (for pain or spasm) for up to 20 doses 20 tablet 0   oxyCODONE (OXY IR/ROXICODONE) 5 MG immediate release tablet Take 1 tablet (5 mg total) by mouth every 4 (four) hours as needed for severe pain. 30 tablet 0   pantoprazole (PROTONIX) 40 MG tablet Take 1 tablet (40 mg total) by mouth once daily 30 tablet  11   predniSONE (DELTASONE) 10 MG tablet Take 6 tabs by mouth daily for 2 days, 5 tabs daily for 2 days, 4 tabs daily for 2 days, 3 tabs daily for 2 days, 2 tabs for 2 days, 1 tab daily for 2 days then stop. 42 tablet 0   tamsulosin (FLOMAX) 0.4 MG CAPS capsule Take 2 capsules (0.8 mg total) by mouth daily. 60 capsule 11    Musculoskeletal: Strength & Muscle Tone: within normal limits Gait & Station: normal Patient leans: N/A Psychiatric Specialty Exam:  Presentation  General Appearance:  Appropriate for Environment  Eye Contact: Fair  Speech: Clear and Coherent  Speech Volume: Normal  Handedness: Right   Mood and Affect  Mood: Anxious  Affect: Congruent   Thought Process  Thought Processes: Coherent  Descriptions of Associations:Intact  Orientation:Full (Time, Place and Person)  Thought Content:Paranoid Ideation;  Obsessions  History of Schizophrenia/Schizoaffective disorder:No  Duration of Psychotic Symptoms:No data recorded Hallucinations:Hallucinations: Tactile; Visual Description of Visual Hallucinations: worms all over him, he feels and sees tem.  Ideas of Reference:Paranoia  Suicidal Thoughts:Suicidal Thoughts: No  Homicidal Thoughts:Homicidal Thoughts: No   Sensorium  Memory: Immediate Good; Recent Good; Remote Good  Judgment: Poor  Insight: Poor   Executive Functions  Concentration: Good  Attention Span: Good  Recall: Good  Fund of Knowledge: Good  Language: Good   Psychomotor Activity  Psychomotor Activity: Psychomotor Activity: Normal   Assets  Assets: Communication Skills; Desire for Improvement; Leisure Time; Social Support   Sleep  Sleep: Sleep: Poor   Physical Exam: Physical Exam Vitals and nursing note reviewed.  Constitutional:      Appearance: Normal appearance. He is normal weight.  HENT:     Head: Normocephalic and atraumatic.     Right Ear: External ear normal.     Left Ear: External ear  normal.     Nose: Nose normal.     Mouth/Throat:     Mouth: Mucous membranes are moist.  Cardiovascular:     Rate and Rhythm: Normal rate.     Pulses: Normal pulses.  Pulmonary:     Effort: Pulmonary effort is normal.  Musculoskeletal:        General: Normal range of motion.     Cervical back: Normal range of motion and neck supple.  Neurological:     General: No focal deficit present.     Mental Status: He is alert and oriented to person, place, and time.  Psychiatric:        Attention and Perception: Attention and perception normal.        Mood and Affect: Mood is anxious. Affect is flat.        Speech: Speech normal.        Behavior: Behavior normal. Behavior is cooperative.        Thought Content: Thought content is paranoid.        Cognition and Memory: Cognition and memory normal.        Judgment: Judgment normal.    Review of Systems  Psychiatric/Behavioral:  Positive for hallucinations and substance abuse. The patient is nervous/anxious.   All other systems reviewed and are negative.  Blood pressure 133/84, pulse 82, temperature 97.9 F (36.6 C), temperature source Oral, resp. rate 18, height '5\' 5"'$  (1.651 m), weight 59 kg, SpO2 95 %. Body mass index is 21.63 kg/m.  Treatment Plan Summary: Plan   The patient is not a safety risk to himself or others and does not require psychiatric inpatient admission for stabilization and treatment.  Disposition: No evidence of imminent risk to self or others at present.   Patient does not meet criteria for psychiatric inpatient admission. Supportive therapy provided about ongoing stressors. Discussed crisis plan, support from social network, calling 911, coming to the Emergency Department, and calling Suicide Hotline.  Caroline Sauger, NP 04/19/2022 4:32 AM

## 2022-04-19 NOTE — ED Notes (Signed)
Pt given breakfast tray

## 2022-04-20 ENCOUNTER — Other Ambulatory Visit: Payer: Self-pay

## 2022-04-20 ENCOUNTER — Emergency Department
Admission: EM | Admit: 2022-04-20 | Discharge: 2022-04-20 | Disposition: A | Discharge status: Home / Self Care | Payer: Medicaid Other | Attending: Emergency Medicine | Admitting: Emergency Medicine

## 2022-04-20 DIAGNOSIS — F14250 Cocaine dependence with cocaine-induced psychotic disorder with delusions: Secondary | ICD-10-CM

## 2022-04-20 DIAGNOSIS — Z87891 Personal history of nicotine dependence: Secondary | ICD-10-CM | POA: Insufficient documentation

## 2022-04-20 DIAGNOSIS — R441 Visual hallucinations: Secondary | ICD-10-CM | POA: Diagnosis present

## 2022-04-20 DIAGNOSIS — F22 Delusional disorders: Secondary | ICD-10-CM

## 2022-04-20 DIAGNOSIS — I10 Essential (primary) hypertension: Secondary | ICD-10-CM | POA: Insufficient documentation

## 2022-04-20 DIAGNOSIS — F14150 Cocaine abuse with cocaine-induced psychotic disorder with delusions: Secondary | ICD-10-CM | POA: Insufficient documentation

## 2022-04-20 DIAGNOSIS — F14222 Cocaine dependence with intoxication with perceptual disturbance: Secondary | ICD-10-CM | POA: Diagnosis not present

## 2022-04-20 DIAGNOSIS — F1490 Cocaine use, unspecified, uncomplicated: Secondary | ICD-10-CM

## 2022-04-20 HISTORY — DX: Cocaine dependence with intoxication with perceptual disturbance: F14.222

## 2022-04-20 LAB — ACETAMINOPHEN LEVEL: Acetaminophen (Tylenol), Serum: <10 ug/mL — ABNORMAL LOW (ref 10–30)

## 2022-04-20 LAB — COMPREHENSIVE METABOLIC PANEL
ALT: 14 U/L (ref 0–44)
AST: 28 U/L (ref 15–41)
Albumin: 3.8 g/dL (ref 3.5–5.0)
Alkaline Phosphatase: 58 U/L (ref 38–126)
Anion gap: 9 (ref 5–15)
BUN: 13 mg/dL (ref 6–20)
CO2: 24 mmol/L (ref 22–32)
Calcium: 8.8 mg/dL — ABNORMAL LOW (ref 8.9–10.3)
Chloride: 106 mmol/L (ref 98–111)
Creatinine, Ser: 0.92 mg/dL (ref 0.61–1.24)
GFR, Estimated: >60 mL/min (ref >60)
Glucose, Bld: 99 mg/dL (ref 70–99)
Potassium: 3.2 mmol/L — ABNORMAL LOW (ref 3.5–5.1)
Sodium: 139 mmol/L (ref 135–145)
Total Bilirubin: 0.7 mg/dL (ref 0.3–1.2)
Total Protein: 6.2 g/dL — ABNORMAL LOW (ref 6.5–8.1)

## 2022-04-20 LAB — URINE DRUG SCREEN, QUALITATIVE (ARMC ONLY)
Amphetamines, Ur Screen: NOT DETECTED
Barbiturates, Ur Screen: NOT DETECTED
Benzodiazepine, Ur Scrn: POSITIVE — AB
Cannabinoid 50 Ng, Ur ~~LOC~~: NOT DETECTED
Cocaine Metabolite,Ur ~~LOC~~: POSITIVE — AB
MDMA (Ecstasy)Ur Screen: NOT DETECTED
Methadone Scn, Ur: NOT DETECTED
Opiate, Ur Screen: NOT DETECTED
Phencyclidine (PCP) Ur S: NOT DETECTED
Tricyclic, Ur Screen: POSITIVE — AB

## 2022-04-20 LAB — CBC
HCT: 37.6 % — ABNORMAL LOW (ref 39.0–52.0)
Hemoglobin: 12.6 g/dL — ABNORMAL LOW (ref 13.0–17.0)
MCH: 30.1 pg (ref 26.0–34.0)
MCHC: 33.5 g/dL (ref 30.0–36.0)
MCV: 90 fL (ref 80.0–100.0)
Platelets: 242 10*3/uL (ref 150–400)
RBC: 4.18 MIL/uL — ABNORMAL LOW (ref 4.22–5.81)
RDW: 13.4 % (ref 11.5–15.5)
WBC: 4.6 10*3/uL (ref 4.0–10.5)
nRBC: 0 % (ref 0.0–0.2)

## 2022-04-20 LAB — ETHANOL: Alcohol, Ethyl (B): <10 mg/dL (ref <10)

## 2022-04-20 LAB — SALICYLATE LEVEL: Salicylate Lvl: <7 mg/dL — ABNORMAL LOW (ref 7.0–30.0)

## 2022-04-20 MED ORDER — POTASSIUM CHLORIDE CRYS ER 20 MEQ PO TBCR
40.0000 meq | EXTENDED_RELEASE_TABLET | Freq: Once | ORAL | Status: AC
  Administered 2022-04-20: 40 meq via ORAL
  Filled 2022-04-20: qty 2

## 2022-04-20 NOTE — ED Notes (Signed)
Pt given discharge instructions. Pt voiced understanding of instructions. Pt unable to sign due to pen pad not working.

## 2022-04-20 NOTE — ED Notes (Signed)
Pt given a lunch tray. 

## 2022-04-20 NOTE — Consult Note (Signed)
[facility] Consult   Reason for Consult:  delusions of bugs crawling out of his skin Referring Physician:  Charna Archer Patient Identification: Alvin Gonzalez. 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 is a 62 y.o. male patient admitted with visual hallucinations of bugs coming out of his skin.   HPI: Chart was reviewed. Patient presented to the ED this morning after he called EMS due to feeling that bugs/worms were crawling out of his skin. He has had similar presentations to the ED on 3/9 & 3/10.   On evaluation, patient was alert and oriented after sleeping for several hours and providing a UDS. UDS is positive for cocaine. Patient admits to cocaine use for many years, but states that he has never had these delusions before. Patient currently denies delusions, clearly stating that he does not see or feel any bugs/worms on his skin now. He denies any thought or intent of suicide, reports that he does not have any access to weapons, and does not remember saying anything to anyone about shooting himself. No report of previous suicide attempts.  Denies homicidal ideations. Denies auditory or visual hallucinations. Perceptions appear to be intact. He is lucid, speaks in clear, coherent sentences. Patient reports that he lives with his mother, but she is in [place] at  this time.   Discussion regarding cocaine/illicit drug use and delusions. Patient agrees but states that he can stop on his own. He states that he knows about [facility]'s services but does not want to  pursue treatment there at this time.     Past Psychiatric History: per chart review, history of substance abuse; chronic PTSD  Risk to Self:   Risk to Others:   Prior Inpatient Therapy:   Prior Outpatient Therapy:    Past Medical History:  Past Medical History:  Diagnosis Date   Adrenal insufficiency ([place])    AKI (acute kidney injury) ([place])    Anxiety    Cancer ([place])    Chronic back pain    Chronic pain    Chronic prescription opiate use    Depression    Depression    GERD (gastroesophageal reflux disease)    Hemorrhoids    Hypertension    Hypokalemia    Intermittent explosive disorder    Marijuana use    PTSD (post-traumatic stress disorder)    Pulmonary nodule    Risk for falls    Suicide attempt ([place])    prior to 02/18/14 admission    Tobacco abuse     Past Surgical History:  Procedure Laterality Date   CHEST TUBE INSERTION     COLONOSCOPY WITH PROPOFOL N/A 05/14/2021   Procedure: COLONOSCOPY WITH PROPOFOL;  Surgeon: Jonathon Bellows, MD;  Location: [facility] ENDOSCOPY;  Service: Gastroenterology;  Laterality: N/A;   EVALUATION UNDER ANESTHESIA WITH HEMORRHOIDECTOMY N/A 09/10/2021   Procedure: EXAM UNDER ANESTHESIA WITH HEMORRHOIDECTOMY of 2+ columns;  Surgeon: Olean Ree, MD;  Location: [facility] ORS;  Service: General;  Laterality: N/A;  Provider requesting 1.5 hours / 90 minutes for procedure.   LYMPH NODE BIOPSY     Family History:  Family History  Problem Relation Age of Onset   Diabetes Mother    Hypertension Mother    Other Mother        possible ovarian cancer   Heart disease Father    Hypertension Father    Cirrhosis Father    Liver cancer Father    Lung cancer Sister    Heart attack Maternal Grandmother    Suicidality Maternal Grandfather    Other Paternal Grandmother        unknown medical history   Suicidality Paternal Grandfather    Stomach cancer Cousin    Family Psychiatric  History:  Social History:  Social History   Substance and Sexual Activity   Alcohol Use Yes   Comment: occ     Social History   Substance and Sexual Activity  Drug Use Yes   Types: Marijuana, Cocaine   Comment: special occasions , last use 05/19/21. uses cocaine, last use 04/17/22    Social History   Socioeconomic History   Marital status: Legally Separated    Spouse name: Not on file   Number of children: Not on file   Years of education: Not on file   Highest education level: Not on file  Occupational History   Not on file  Tobacco Use   Smoking status: Every Day    Packs/day: 1.00    Years: 45.00    Total pack years: 45.00    Types: Cigarettes   Smokeless tobacco: Former    Quit date: 01/30/2015  Vaping Use   Vaping Use: Former   Quit date: 01/23/2017  Substance and Sexual Activity   Alcohol use: Yes    Comment: occ   Drug use: Yes    Types: Marijuana, Cocaine    Comment: special occasions , last use 05/19/21. uses cocaine, last use 04/17/22   Sexual activity: Yes    Birth control/protection: None  Other Topics Concern   Not on file  Social History Narrative   Not on file   Social Determinants of Health   Financial Resource Strain: Not on file  Food Insecurity: No Food Insecurity (01/23/2021)   Hunger Vital Sign    Worried About Running Out of Food in the Last Year: Never true    Ran Out of Food in the Last Year: Never true  Transportation Needs: No Transportation Needs (01/23/2021)   PRAPARE - Hydrologist (Medical): No    Lack of Transportation (Non-Medical): No  Physical Activity: Not on file  Stress: Not on file  Social Connections: Not on file   Additional Social History:    Allergies:   Allergies  Allergen Reactions   Carbamazepine Rash and Hives   Ibuprofen Nausea And Vomiting and Rash    GI Bleeding    Risperidone Swelling    Tongue swelling Tongue swelling Tongue swelling Tongue swelling? Pt self report   Naproxen Other (See Comments)    stomach bleeding stomach bleeding stomach  bleeding    Labs:  Results for orders placed or performed during the hospital encounter of 04/20/22 (from the past 48 hour(s))  Comprehensive metabolic panel     Status: Abnormal   Collection Time: 04/20/22  9:26 AM  Result Value Ref Range   Sodium 139 135 - 145 mmol/L   Potassium 3.2 (L) 3.5 - 5.1 mmol/L   Chloride 106 98 - 111 mmol/L   CO2 24 22 - 32 mmol/L   Glucose, Bld 99 70 - 99 mg/dL    Comment: Glucose reference  range applies only to samples taken after fasting for at least 8 hours.   BUN 13 6 - 20 mg/dL   Creatinine, Ser 0.92 0.61 - 1.24 mg/dL   Calcium 8.8 (L) 8.9 - 10.3 mg/dL   Total Protein 6.2 (L) 6.5 - 8.1 g/dL   Albumin 3.8 3.5 - 5.0 g/dL   AST 28 15 - 41 U/L   ALT 14 0 - 44 U/L   Alkaline Phosphatase 58 38 - 126 U/L   Total Bilirubin 0.7 0.3 - 1.2 mg/dL   GFR, Estimated >60 >60 mL/min    Comment: (NOTE) Calculated using the CKD-EPI Creatinine Equation (2021)    Anion gap 9 5 - 15    Comment: Performed at [facility], [address], [place], [place] [address]  Ethanol     Status: None   Collection Time: 04/20/22  9:26 AM  Result Value Ref Range   Alcohol, Ethyl (B) <10 <10 mg/dL    Comment: (NOTE) Lowest detectable limit for serum alcohol is 10 mg/dL.  For medical purposes only. Performed at [facility], [address] [place], [place] [id number]   Salicylate level     Status: Abnormal   Collection Time: 04/20/22  9:26 AM  Result Value Ref Range   Salicylate Lvl Q000111Q (L) 7.0 - 30.0 mg/dL    Comment: Performed at [facility], [address] [place], [place] [address]  Acetaminophen level     Status: Abnormal   Collection Time: 04/20/22  9:26 AM  Result Value Ref Range   Acetaminophen (Tylenol), Serum <10 (L) 10 - 30 ug/mL    Comment: (NOTE) Therapeutic concentrations vary significantly. A range of 10-30 ug/mL  may be an effective concentration for many patients. However, some  are best treated at concentrations  outside of this range. Acetaminophen concentrations >150 ug/mL at 4 hours after ingestion  and >50 ug/mL at 12 hours after ingestion are often associated with  toxic reactions.  Performed at [facility], [address] [place], [place] [address]   cbc     Status: Abnormal   Collection Time: 04/20/22  9:26 AM  Result Value Ref Range   WBC 4.6 4.0 - 10.5 K/uL   RBC 4.18 (L) 4.22 - 5.81 MIL/uL   Hemoglobin 12.6 (L) 13.0 - 17.0 g/dL   HCT 37.6 (L) 39.0 - 52.0 %   MCV 90.0 80.0 - 100.0 fL   MCH 30.1 26.0 - 34.0 pg   MCHC 33.5 30.0 - 36.0 g/dL   RDW 13.4 11.5 - 15.5 %   Platelets 242 150 - 400 K/uL   nRBC 0.0 0.0 - 0.2 %    Comment: Performed at [facility], [address] [place], [place] [address]  Urine Drug Screen, Qualitative     Status: Abnormal   Collection Time: 04/20/22  1:50 PM  Result Value Ref Range   Tricyclic, Ur Screen POSITIVE (A) NONE DETECTED   Amphetamines, Ur Screen NONE DETECTED NONE DETECTED   MDMA (Ecstasy)Ur Screen NONE DETECTED NONE DETECTED   Cocaine Metabolite,Ur [place] POSITIVE (A) NONE DETECTED   Opiate, Ur Screen NONE DETECTED NONE DETECTED   Phencyclidine (PCP) Ur S NONE DETECTED NONE DETECTED   Cannabinoid 50 Ng, Ur [place] NONE DETECTED NONE DETECTED   Barbiturates, Ur Screen NONE DETECTED NONE DETECTED   Benzodiazepine, Ur Scrn POSITIVE (A) NONE DETECTED   Methadone Scn, Ur NONE DETECTED NONE DETECTED    Comment: (NOTE) Tricyclics + metabolites, urine  Cutoff 1000 ng/mL Amphetamines + metabolites, urine  Cutoff 1000 ng/mL MDMA (Ecstasy), urine              Cutoff 500 ng/mL Cocaine Metabolite, urine          Cutoff 300 ng/mL Opiate + metabolites, urine        Cutoff 300 ng/mL Phencyclidine (PCP), urine         Cutoff 25 ng/mL Cannabinoid, urine                 Cutoff 50 ng/mL Barbiturates + metabolites, urine  Cutoff 200 ng/mL Benzodiazepine, urine              Cutoff 200 ng/mL Methadone, urine                   Cutoff 300  ng/mL  The urine drug screen provides only a preliminary, unconfirmed analytical test result and should not be used for non-medical purposes. Clinical consideration and professional judgment should be applied to any positive drug screen result due to possible interfering substances. A more specific alternate chemical method must be used in order to obtain a confirmed analytical result. Gas chromatography / mass spectrometry (GC/MS) is the preferred confirm atory method. Performed at [facility], [address] [place], [place] [address]     No current facility-administered medications for this encounter.   Current Outpatient Medications  Medication Sig Dispense Refill   acetaminophen (TYLENOL) 500 MG tablet Take 1,000 mg by mouth every 6 (six) hours as needed.     albuterol (VENTOLIN HFA) 108 (90 Base) MCG/ACT inhaler Inhale 1-2 puffs into the lungs every 6 (six) hours as needed for wheezing or shortness of breath. (Patient taking differently: Inhale 1-2 puffs into the lungs 3 (three) times daily.) 6.7 g 0   albuterol (VENTOLIN HFA) 108 (90 Base) MCG/ACT inhaler Inhale into the lungs. (Patient not taking: Reported on 04/19/2022)     albuterol (VENTOLIN HFA) 108 (90 Base) MCG/ACT inhaler Inhale 2 inhalations into the lungs every 6 (six) hours as needed 6.7 g 11   amitriptyline (ELAVIL) 25 MG tablet Take 1 tablet (25 mg total) by mouth at bedtime 30 tablet 3   amLODipine (NORVASC) 10 MG tablet Take 1 tablet (10 mg total) by mouth daily. 90 tablet 1   amoxicillin (AMOXIL) 500 MG capsule Take 1 capsule (500 mg total) by mouth 3 (three) times daily for 7 days. (Patient not taking: Reported on 04/19/2022) 21 capsule 0   gabapentin (NEURONTIN) 400 MG capsule Take 1 capsule (400 mg total) by mouth 3 (three) times daily. (Patient not taking: Reported on 04/19/2022) 90 capsule 0   hydrochlorothiazide (HYDRODIURIL) 25 MG tablet Take 1 tablet (25 mg total) by mouth once daily 90 tablet 3    hydrocortisone (ANUSOL-HC) 2.5 % rectal cream Place 1 Application rectally 2 (two) times daily. 28 g 0   lidocaine (XYLOCAINE) 5 % ointment Apply 1 Application topically 4 (four) times daily as needed for mild pain or moderate pain. Apply to perianal area 50 g 0   lisinopril (ZESTRIL) 30 MG tablet Take 1 tablet (30 mg total) by mouth daily. (Patient not taking: Reported on 04/19/2022) 30 tablet 0   lisinopril (ZESTRIL) 40 MG tablet Take 1 tablet (40 mg total) by mouth daily. 90 tablet 1   lisinopril (ZESTRIL) 40 MG tablet Take 1 tablet (40 mg total) by mouth daily. 90 tablet 1   metaxalone (SKELAXIN) 800 MG tablet Take 1 tablet (800 mg  total) by mouth 3 (three) times daily as needed for Pain (for pain or spasm) for up to 20 doses (Patient not taking: Reported on 04/19/2022) 20 tablet 0   oxyCODONE (OXY IR/ROXICODONE) 5 MG immediate release tablet Take 1 tablet (5 mg total) by mouth every 4 (four) hours as needed for severe pain. (Patient not taking: Reported on 04/19/2022) 30 tablet 0   pantoprazole (PROTONIX) 40 MG tablet Take 1 tablet (40 mg total) by mouth once daily 30 tablet 11   predniSONE (DELTASONE) 10 MG tablet Take 6 tabs by mouth daily for 2 days, 5 tabs daily for 2 days, 4 tabs daily for 2 days, 3 tabs daily for 2 days, 2 tabs for 2 days, 1 tab daily for 2 days then stop. (Patient not taking: Reported on 04/19/2022) 42 tablet 0   tamsulosin (FLOMAX) 0.4 MG CAPS capsule Take 2 capsules (0.8 mg total) by mouth daily. 60 capsule 11    Musculoskeletal: Strength & Muscle Tone: within normal limits Gait & Station: normal Patient leans: N/A    Psychiatric Specialty Exam:  Presentation  General Appearance:  Disheveled  Eye Contact: Fair  Speech: Clear and Coherent  Speech Volume: Normal  Handedness: Right   Mood and Affect  Mood: Euthymic  Affect: Blunt   Thought Process  Thought Processes: Coherent  Descriptions of Associations:Intact  Orientation:Full (Time,  Place and Person)  Thought Content:WDL  History of Schizophrenia/Schizoaffective disorder:No  Duration of Psychotic Symptoms:No data recorded Hallucinations: Denies current  Ideas of Reference:None  Suicidal Thoughts:Suicidal Thoughts: No  Homicidal Thoughts:Homicidal Thoughts: No   Sensorium  Memory: Immediate Fair; Recent Good  Judgment: Poor  Insight: Poor   Executive Functions  Concentration: Fair  Attention Span: Fair  Recall: [place] of Knowledge: Good  Language: Fair   Psychomotor Activity  Psychomotor Activity: Psychomotor Activity: Normal   Assets  Assets: Resilience; Physical Health; Housing   Sleep  Sleep: Sleep: Fair   Physical Exam: Physical Exam Vitals and nursing note reviewed.  HENT:     Head: Normocephalic.     Nose: No congestion.  Eyes:     General:        Right eye: No discharge.        Left eye: No discharge.  Cardiovascular:     Rate and Rhythm: Normal rate.  Pulmonary:     Effort: Pulmonary effort is normal.  Musculoskeletal:        General: Normal range of motion.     Cervical back: Normal range of motion.  Skin:    General: Skin is dry.  Neurological:     Mental Status: He is alert and oriented to person, place, and time.  Psychiatric:        Attention and Perception: Attention normal.        Mood and Affect: Mood normal. Affect is flat.        Speech: Speech normal.        Behavior: Behavior normal. Behavior is cooperative.        Thought Content: Thought content is not paranoid. Thought content does not include homicidal or suicidal ideation.        Cognition and Memory: Cognition normal.        Judgment: Judgment normal.    Review of Systems  Constitutional: Negative.   HENT: Negative.    Respiratory: Negative.    Musculoskeletal: Negative.   Skin: Negative.   Psychiatric/Behavioral:  Positive for substance abuse.    Blood pressure 138/89, pulse [name])  58, temperature 97.7 F (36.5 C),  temperature source Axillary, resp. rate 16, height '5\' 5"'$  (1.651 m), weight 59 kg, SpO2 100 %. Body mass index is 21.64 kg/m.  Treatment Plan Summary: Plan Patient's symptoms are consistent with  a substance-induced delusion of worms crawling on his body. He does not currently have this delusion and is refusing referral to [facility] for substance use. He states "I can quit if I want." He agrees that cocaine use can cause delusions and psychosis. He does not have any  SI/HI/AVH . Does not meet criteria for inpatient psychiatric hospitalization and no longer meets criteria for IVC. Reviewed with Dr. Charna Archer  Disposition: No evidence of imminent risk to self or others at present.   Patient does not meet criteria for psychiatric inpatient admission. Discussed crisis plan, support from social network, calling 911, coming to the Emergency Department, and calling Suicide Hotline.  Sherlon Handing, NP 04/20/2022 4:26 PM

## 2022-04-20 NOTE — Consult Note (Addendum)
1130: Attempted to consult on patient. He was asleep, had been medicated by [facility] in the field with haldol and versed. Will assess when patient becomes more alert.   Sherlon Handing, PMHNP

## 2022-04-20 NOTE — ED Provider Notes (Signed)
[facility] Provider Note    Event Date/Time   First MD Initiated Contact with Patient 04/20/22 [phone]     (approximate)   History   Chief Complaint Suicidal and Hallucinations   HPI  Alvin Gonzalez. is a 62 y.o. male with past medical history of hypertension, GERD, and chronic pain syndrome who presents to the ED for suicidal ideation.  Per EMS, patient initially called 911 due to sensation that bugs were crawling out of his skin, during the call stated that he was going to shoot himself with a gun.  When EMS arrived, they report patient was very anxious, fearful, and increasingly agitated.  He was medicated with 5 mg of IM Haldol initially followed by 5 mg of Versed via IV.  Patient somnolent on arrival to the ED, unable to answer any questions      Physical Exam   Triage Vital Signs: ED Triage Vitals  Enc Vitals Group     BP 04/20/22 0927 (!) 143/88     Pulse Rate 04/20/22 0927 70     Resp 04/20/22 0927 16     Temp 04/20/22 0927 97.7 F (36.5 C)     Temp Source 04/20/22 0927 Axillary     SpO2 04/20/22 0927 100 %     Weight 04/20/22 0929 130 lb 1.1 oz (59 kg)     Height 04/20/22 0929 '5\' 5"'$  (1.651 m)     Head Circumference --      Peak Flow --      Pain Score 04/20/22 0929 Asleep     Pain Loc --      Pain Edu? --      Excl. in [place]? --     Most recent vital signs: Vitals:   04/20/22 1300 04/20/22 1338  BP: (!) 158/73 138/89  Pulse: 60 (!) 58  Resp: 14 16  Temp:    SpO2: 100% 100%    Constitutional: Somnolent but arousable to voice. Eyes: Conjunctivae are normal.  Pupils equal, round, and reactive to light bilaterally. Head: Atraumatic. Nose: No congestion/rhinnorhea. Mouth/Throat: Mucous membranes are moist.  Cardiovascular: Normal rate, regular rhythm. Grossly normal heart sounds.  2+ radial pulses bilaterally. Respiratory: Normal respiratory effort.  No retractions. Lungs CTAB. Gastrointestinal: Soft and nontender. No  distention. Musculoskeletal: No lower extremity tenderness nor edema.  Neurologic:  No gross focal neurologic deficits are appreciated, withdraws from pain in all 4 extremities.    ED Results / Procedures / Treatments   Labs (all labs ordered are listed, but only abnormal results are displayed) Labs Reviewed  COMPREHENSIVE METABOLIC PANEL - Abnormal; Notable for the following components:      Result Value   Potassium 3.2 (*)    Calcium 8.8 (*)    Total Protein 6.2 (*)    All other components within normal limits  SALICYLATE LEVEL - Abnormal; Notable for the following components:   Salicylate Lvl Q000111Q (*)    All other components within normal limits  ACETAMINOPHEN LEVEL - Abnormal; Notable for the following components:   Acetaminophen (Tylenol), Serum <10 (*)    All other components within normal limits  CBC - Abnormal; Notable for the following components:   RBC 4.18 (*)    Hemoglobin 12.6 (*)    HCT 37.6 (*)    All other components within normal limits  URINE DRUG SCREEN, QUALITATIVE ([facility] ONLY) - Abnormal; Notable for the following components:   Tricyclic, Ur Screen POSITIVE (*)    Cocaine Metabolite,Ur  [place] POSITIVE (*)    Benzodiazepine, Ur Scrn POSITIVE (*)    All other components within normal limits  ETHANOL     EKG  ED ECG REPORT I, Blake Divine, the attending physician, personally viewed and interpreted this ECG.   Date: 04/20/2022  EKG Time: 9:56  Rate: 67  Rhythm: normal sinus rhythm  Axis: Normal  Intervals:none  ST&T Change: None  PROCEDURES:  Critical Care performed: No  Procedures   MEDICATIONS ORDERED IN ED: Medications  potassium chloride SA (KLOR-CON M) CR tablet 40 mEq (40 mEq Oral Given 04/20/22 1340)     IMPRESSION / MDM / ASSESSMENT AND PLAN / ED COURSE  I reviewed the triage vital signs and the nursing notes.                              62 y.o. male with past medical history of hypertension, GERD, and chronic pain syndrome who  presents to the ED for psychiatric evaluation after he called 911 reporting bugs crawling out of his skin, threatened to shoot himself at that time.  Patient's presentation is most consistent with acute presentation with potential threat to life or bodily function.  Differential diagnosis includes, but is not limited to, suicidal ideation, homicidal ideation, delusional disorder, psychosis, paranoia, substance abuse, electrolyte abnormality.  Patient somnolent but arousable to voice on arrival, vital signs are unremarkable and he has no significant respiratory depression.  He has no focal neurologic deficits on exam, doubt intracranial process.  He does have history of cocaine use, was seen in the ED yesterday for similar complaint of bugs crawling on his skin.  Screening labs remarkable for mild hypokalemia, which was repleted.  No significant anemia, leukocytosis, electrolyte abnormality, or AKI noted.  LFTs are also unremarkable.  Patient became gradually more awake and alert, admits to recent cocaine use which is likely contributing to his presentation.  He denies any ongoing suicidal or homicidal ideation, was cleared by psychiatry for discharge home, IVC was rescinded.  Patient counseled to return to the ED for new or worsening symptoms, otherwise avoid cocaine use.  Patient agrees with plan.      FINAL CLINICAL IMPRESSION(S) / ED DIAGNOSES   Final diagnoses:  Delusional disorder ([place])  Cocaine use     Rx / DC Orders   ED Discharge Orders     None        Note:  This document was prepared using Dragon voice recognition software and may include unintentional dictation errors.   Blake Divine, MD 04/20/22 [id number]

## 2022-04-20 NOTE — ED Notes (Signed)
IVC PENDING  CONSULT ?

## 2022-04-20 NOTE — ED Notes (Signed)
IVC PAPERS  RESCINDED  PER  LOUISE  NP  INFORMED  [place]  RN 

## 2022-04-20 NOTE — ED Notes (Signed)
Pt informed that he will get a cab voucher. Address to destination taken and written on voucher. Pt given blue scrubs to change into for departure.

## 2022-04-20 NOTE — ED Triage Notes (Addendum)
BIB [facility] from home.  Pt called in reference to having visual hallucinations of bugs crawling out of his skin. Pt told tele-communicator that he was going to shoot himself with a gun. Pt cooperative with EMS but very anxious and fearful. Pt received 51mIM haldol, '5mg'$  Versed PIV and 1L of LR through 20G to R forearm that EMS established en route. Reports of HR in 130's on arrival with EMS. CBG 106. Pt arrives with eyes closed, calm, and appears to be resting peacefully. RR even and unlabored, equal rise and fall of chest. Pt arrived naked. Hospital scrubs placed on patient. No belongings to bag.

## 2022-04-20 NOTE — ED Notes (Signed)
Refused to give urine sample at this time, states he does not have to urinate. Aware of need for sample for testing.

## 2022-04-20 NOTE — BH Assessment (Signed)
Comprehensive Clinical Assessment (CCA) Screening, Triage and Referral Note  04/20/2022 Alvin Gonzalez [id number]  Alvin Gonzalez., 62 year old male who presents to [facility] ED involuntarily for treatment. Per triage note, BIB [facility] from home. Pt called in reference to having visual hallucinations of bugs crawling out of his skin. Pt told tele-communicator that he was going to shoot himself with a gun. Pt cooperative with EMS but very anxious and fearful. Pt received 69mIM Haldol, '5mg'$  Versed PIV and 1L of LR through 20G to R forearm that EMS established en route. Reports of HR in 130's on arrival with EMS. CBG 106. Pt arrives with eyes closed, calm, and appears to be resting peacefully. RR even and unlabored, equal rise and fall of chest. Pt arrived naked. Hospital scrubs placed on patient. No belongings to bag.   During TTS assessment pt presents alert and oriented x 4, restless but cooperative, and mood-congruent with affect. The pt does not appear to be responding to internal or external stimuli. Neither is the pt presenting with any delusional thinking. Pt verified the information provided to triage RN.   Pt identifies his main complaint to be that he had "parasites" coming out of his skin. Patient reports they were all over his arms, legs and lower back. Patient states they were even on his dog. Patient [place] Patient reports he is not currently working. Patient admits to using cocaine and last used on yesterday. Patient denies using any other illicit substances and alcohol. Pt denies current SI/HI/AH/VH. Pt contracts for safety. [place]. [facility] [place] stating he did not like [facility] and he has no interest in going to rehab. "I can quit if I want to." Patient reports he no longer feels or sees "bugs" on his body.    Per LBarbaraann Share NP, pt shows no evidence of imminent risk to self or others at  present and does not meet criteria for psychiatric inpatient admission.   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 Patient paranoid and delusional.  How Long Has This Been Causing You Problems? <Week  What Do You Feel Would Help You the Most Today? -- (Assessment only)   Have You Recently Had Any Thoughts About Hurting Yourself? No  Are You Planning to Commit Suicide/Harm Yourself At This time? No   Have you Recently Had Thoughts About [place] No  Are You Planning to Harm Someone at This Time? No  Explanation: No data recorded  Have You Used Any Alcohol or Drugs in the Past 24 Hours? Yes  How Long Ago Did You Use Drugs or Alcohol? No data recorded What Did You Use and How Much? Cocaine   Do You Currently Have a Therapist/Psychiatrist? No  Name of Therapist/Psychiatrist: n/a   Have You Been Recently Discharged From Any Office Practice or Programs? No  Explanation of Discharge From Practice/Program: n/a    CCA Screening Triage Referral Assessment Type of Contact: Face-to-Face  Telemedicine Service Delivery:   Is this Initial or Reassessment?   Date Telepsych consult ordered in CHL:    Time Telepsych consult ordered in CHL:  Location of Assessment: [facility] ED  Provider Location: [facility] ED    Collateral Involvement: None provided   Does Patient Have a [place]? No data recorded Name and Contact of Legal Guardian: No data recorded If Minor and Not Living with Parent(s), Who has Custody? n/a  Is CPS involved or ever been involved? Never  Is APS involved or ever been involved? Never   Patient Determined To Be At Risk for Harm To  Self or Others Based on Review of Patient Reported Information or Presenting Complaint? No  Method: No Plan  Availability of Means: No access or NA  Intent: Vague intent or NA  Notification Required: No need or identified person  Additional Information for Danger to Others Potential: Active psychosis  Additional Comments for Danger to Others Potential: No data recorded Are There Guns or Other Weapons in Your Home? -- (UTA)  Types of Guns/Weapons: UTA  Are These Weapons Safely Secured?                            -- (UTA)  Who Could Verify You Are Able To Have These Secured: UTA  Do You Have any Outstanding Charges, Pending Court Dates, Parole/Probation? None reported  Contacted To Inform of Risk of Harm To Self or Others: Other: Comment   Does Patient Present under Involuntary Commitment? Yes    [place] of Residence: [place]   Patient Currently Receiving the Following Services: Not Receiving Services   Determination of Need: Emergent (2 hours)   Options For Referral: ED Visit; Chemical Dependency Intensive Outpatient Therapy (CDIOP); Outpatient Therapy   Discharge Disposition:     Eula Fried, Counselor, LCAS-A

## 2022-06-16 ENCOUNTER — Encounter: Payer: Self-pay | Admitting: Emergency Medicine

## 2022-06-16 ENCOUNTER — Other Ambulatory Visit: Payer: Self-pay

## 2022-06-16 ENCOUNTER — Emergency Department
Admission: EM | Admit: 2022-06-16 | Discharge: 2022-06-16 | Disposition: A | Discharge status: Home / Self Care | Payer: Medicaid Other | Attending: Emergency Medicine | Admitting: Emergency Medicine

## 2022-06-16 ENCOUNTER — Emergency Department: Payer: Medicaid Other

## 2022-06-16 DIAGNOSIS — S299XXA Unspecified injury of thorax, initial encounter: Secondary | ICD-10-CM | POA: Diagnosis present

## 2022-06-16 DIAGNOSIS — I10 Essential (primary) hypertension: Secondary | ICD-10-CM | POA: Diagnosis not present

## 2022-06-16 DIAGNOSIS — Z720 Tobacco use: Secondary | ICD-10-CM | POA: Insufficient documentation

## 2022-06-16 DIAGNOSIS — S20211A Contusion of right front wall of thorax, initial encounter: Secondary | ICD-10-CM

## 2022-06-16 MED ORDER — LIDOCAINE 5 % EX PTCH
1.0000 | MEDICATED_PATCH | CUTANEOUS | Status: DC
  Administered 2022-06-16: 1 via TRANSDERMAL
  Filled 2022-06-16: qty 1

## 2022-06-16 MED ORDER — OXYCODONE HCL 5 MG PO TABS
5.0000 mg | ORAL_TABLET | Freq: Once | ORAL | Status: AC
  Administered 2022-06-16: 5 mg via ORAL
  Filled 2022-06-16: qty 1

## 2022-06-16 MED ORDER — LIDOCAINE 5 % EX PTCH
1.0000 | MEDICATED_PATCH | Freq: Two times a day (BID) | CUTANEOUS | 0 refills | Status: AC
  Filled 2022-06-16: qty 10, 5d supply, fill #0

## 2022-06-16 NOTE — Discharge Instructions (Addendum)
Your x-rays do not show any broken bones.  Please return for any new, worsening, or change in symptoms or other concerns.  It was a pleasure caring for you today.
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
## 2022-06-30 ENCOUNTER — Other Ambulatory Visit: Payer: Self-pay

## 2022-07-01 ENCOUNTER — Other Ambulatory Visit: Payer: Self-pay

## 2022-07-14 ENCOUNTER — Other Ambulatory Visit: Payer: Self-pay

## 2022-08-11 ENCOUNTER — Other Ambulatory Visit: Payer: Self-pay

## 2022-10-09 ENCOUNTER — Ambulatory Visit: Payer: 59 | Admitting: Urology

## 2022-12-01 ENCOUNTER — Other Ambulatory Visit: Payer: Self-pay

## 2022-12-01 ENCOUNTER — Emergency Department
Admission: EM | Admit: 2022-12-01 | Discharge: 2022-12-01 | Disposition: A | Discharge status: Home / Self Care | Payer: Medicaid Other | Attending: Emergency Medicine | Admitting: Emergency Medicine

## 2022-12-01 DIAGNOSIS — M5441 Lumbago with sciatica, right side: Secondary | ICD-10-CM | POA: Insufficient documentation

## 2022-12-01 DIAGNOSIS — M5442 Lumbago with sciatica, left side: Secondary | ICD-10-CM | POA: Diagnosis not present

## 2022-12-01 DIAGNOSIS — M545 Low back pain, unspecified: Secondary | ICD-10-CM | POA: Diagnosis present

## 2022-12-01 MED ORDER — KETOROLAC TROMETHAMINE 15 MG/ML IJ SOLN
15.0000 mg | Freq: Once | INTRAMUSCULAR | Status: AC
  Administered 2022-12-01: 15 mg via INTRAMUSCULAR
  Filled 2022-12-01: qty 1

## 2022-12-01 MED ORDER — ACETAMINOPHEN 500 MG PO TABS
1000.0000 mg | ORAL_TABLET | Freq: Once | ORAL | Status: AC
  Administered 2022-12-01: 1000 mg via ORAL
  Filled 2022-12-01: qty 2

## 2022-12-01 MED ORDER — OXYCODONE HCL 5 MG PO TABS
5.0000 mg | ORAL_TABLET | Freq: Three times a day (TID) | ORAL | 0 refills | Status: DC | PRN
  Filled 2022-12-01: qty 12, 4d supply, fill #0

## 2022-12-01 MED ORDER — DEXAMETHASONE SODIUM PHOSPHATE 10 MG/ML IJ SOLN
8.0000 mg | Freq: Once | INTRAMUSCULAR | Status: AC
  Administered 2022-12-01: 8 mg via INTRAMUSCULAR
  Filled 2022-12-01: qty 1

## 2022-12-01 MED ORDER — OXYCODONE HCL 5 MG PO TABS
5.0000 mg | ORAL_TABLET | Freq: Once | ORAL | Status: AC
  Administered 2022-12-01: 5 mg via ORAL
  Filled 2022-12-01: qty 1

## 2022-12-01 MED ORDER — LIDOCAINE 5 % EX PTCH
1.0000 | MEDICATED_PATCH | CUTANEOUS | 0 refills | Status: AC
  Filled 2022-12-01: qty 14, 14d supply, fill #0

## 2022-12-01 MED ORDER — METHOCARBAMOL 750 MG PO TABS
750.0000 mg | ORAL_TABLET | Freq: Four times a day (QID) | ORAL | 0 refills | Status: AC
  Filled 2022-12-01: qty 40, 10d supply, fill #0

## 2022-12-01 MED ORDER — LIDOCAINE 5 % EX PTCH
1.0000 | MEDICATED_PATCH | CUTANEOUS | Status: DC
  Administered 2022-12-01: 1 via TRANSDERMAL
  Filled 2022-12-01: qty 1

## 2022-12-01 NOTE — ED Notes (Signed)
Pt asking for something to drink. I explained that the tech went to go ask the physician if it was okay for him to have a drink. Pt became angry and said "fuck this shit." He took his BP cuff off and walked down the hallway.

## 2022-12-01 NOTE — Discharge Instructions (Signed)
Take Tylenol 650 mg every 6 hours for pain.  Take prescribed medications and pain patches.  Talk to your primary doctor and call pain specialist for an appointment and to establish care.  Thank you for choosing [place] for your health care today!  Please see your primary doctor this week for a follow up appointment.   If you have any new, worsening, or unexpected symptoms call your doctor right away or come back to the emergency department for reevaluation.  It was my pleasure to care for you today.   Daneil Dan Modesto Charon, MD

## 2022-12-01 NOTE — ED Triage Notes (Addendum)
C/O low back pain x 2-3 days. Pain radiating down legs bilaterally.   Hx chronic back pain. Denies injury.  Tyelnol taken today at 0830.
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
## 2022-12-22 ENCOUNTER — Other Ambulatory Visit: Payer: Self-pay

## 2022-12-22 ENCOUNTER — Emergency Department
Admission: EM | Admit: 2022-12-22 | Discharge: 2022-12-22 | Disposition: A | Discharge status: Home / Self Care | Payer: Medicaid Other | Attending: Emergency Medicine | Admitting: Emergency Medicine

## 2022-12-22 DIAGNOSIS — M545 Low back pain, unspecified: Secondary | ICD-10-CM | POA: Insufficient documentation

## 2022-12-22 DIAGNOSIS — G8929 Other chronic pain: Secondary | ICD-10-CM | POA: Diagnosis not present

## 2022-12-22 MED ORDER — HYDROCODONE-ACETAMINOPHEN 5-325 MG PO TABS
1.0000 | ORAL_TABLET | Freq: Once | ORAL | Status: AC
  Administered 2022-12-22: 1 via ORAL
  Filled 2022-12-22: qty 1

## 2022-12-22 MED ORDER — CYCLOBENZAPRINE HCL 5 MG PO TABS: 5.0000 mg | ORAL_TABLET | Freq: Three times a day (TID) | ORAL | 0 refills | Status: DC | PRN

## 2022-12-22 MED ORDER — HYDROCODONE-ACETAMINOPHEN 5-325 MG PO TABS: 1.0000 | ORAL_TABLET | Freq: Three times a day (TID) | ORAL | 0 refills | Status: AC | PRN

## 2022-12-22 NOTE — ED Provider Notes (Signed)
[facility] Emergency Department Provider Note     Event Date/Time   First MD Initiated Contact with Patient 12/22/22 1618     (approximate)   History   Back Pain   HPI  Alvin Ice. is a 62 y.o. male with a history of chronic back pain, presents to the ED for evaluation of acute on chronic exacerbation per patient denies any recent injury, trauma, fall.  He denies any bladder or bowel incontinence, foot drop, or saddle anesthesia.  He reports limited benefit with OTC anti-inflammatories and acetaminophen.  Patient would also give report that he had been seen here for similar complaints in the past, and was awaiting a call from the pain clinic referral.  I advised the patient that he had to actively make the phone call to establish care with a pain management clinic.  Physical Exam   Triage Vital Signs: ED Triage Vitals  Encounter Vitals Group     BP 12/22/22 1518 (!) 154/110     Systolic BP Percentile --      Diastolic BP Percentile --      Pulse Rate 12/22/22 1518 94     Resp 12/22/22 1518 17     Temp 12/22/22 1518 97.7 F (36.5 C)     Temp Source 12/22/22 1518 Oral     SpO2 12/22/22 1518 97 %     Weight --      Height --      Head Circumference --      Peak Flow --      Pain Score 12/22/22 1517 10     Pain Loc --      Pain Education --      Exclude from Growth Chart --     Most recent vital signs: Vitals:   12/22/22 1518  BP: (!) 154/110  Pulse: 94  Resp: 17  Temp: 97.7 F (36.5 C)  SpO2: 97%    General Awake, no distress. NAD HEENT NCAT. PERRL. EOMI. No rhinorrhea. Mucous membranes are moist.  CV:  Good peripheral perfusion. RRR RESP:  Normal effort. CTA ABD:  No distention.  MSK:  Normal spinal alignment without midline tenderness, spasm, deformity, or step-off.  Normal transition from sit to stand.  Normal hip flexion and extension range on exam. NEURO: Cranial nerves II to XII grossly intact.   ED Results /  Procedures / Treatments   Labs (all labs ordered are listed, but only abnormal results are displayed) Labs Reviewed - No data to display  EKG   RADIOLOGY   No results found.   PROCEDURES:  Critical Care performed: No  Procedures   MEDICATIONS ORDERED IN ED: Medications  HYDROcodone-acetaminophen (NORCO/VICODIN) 5-325 MG per tablet 1 tablet (1 tablet Oral Given 12/22/22 1721)     IMPRESSION / MDM / ASSESSMENT AND PLAN / ED COURSE  I reviewed the triage vital signs and the nursing notes.                              Differential diagnosis includes, but is not limited to, lumbar strain, lumbar radiculopathy, acute exacerbation of chronic pain  Patient's presentation is most consistent with acute complicated illness / injury requiring diagnostic workup.  Patient's diagnosis is consistent with acute on chronic midline low back pain without sciatica.  With reassuring exam and workup at this time.  No acute neuro muscle deficits noted.  No cerebellar ataxia.  No  red flags on exam.  Patient will be discharged home with prescriptions for cyclobenzaprine and hydrocodone. Patient is to follow up with PCP or [facility] pain management clinic as discussed, as needed or otherwise directed. Patient is given ED precautions to return to the ED for any worsening or new symptoms.   FINAL CLINICAL IMPRESSION(S) / ED DIAGNOSES   Final diagnoses:  Chronic midline low back pain without sciatica     Rx / DC Orders   ED Discharge Orders          Ordered    cyclobenzaprine (FLEXERIL) 5 MG tablet  3 times daily PRN        12/22/22 1714    HYDROcodone-acetaminophen (NORCO) 5-325 MG tablet  3 times daily PRN        12/22/22 1714             Note:  This document was prepared using Dragon voice recognition software and may include unintentional dictation errors.    Lissa Hoard, PA-C 12/23/22 2320    Jene Every, MD 12/25/22 [phone]

## 2022-12-22 NOTE — Discharge Instructions (Addendum)
Take the prescription meds as directed. Follow-up with your primary provider for further evaluation. You should also call to set up an evaluation with [facility].

## 2022-12-22 NOTE — ED Triage Notes (Signed)
Pt to ED via POV from home. Pt reports low back pain. Pt denies radiation. Pt denies injury. Pt reports hx of chronic back pain. Pt reports no relief with at home medication.

## 2023-01-19 NOTE — Congregational Nurse Program (Signed)
  Dept: [phone]   Congregational Nurse Program Note  Date of Encounter: 01/19/2023 Client to [facility] day center with request for Rn assistance in making an vision screening apt and also an apt with his PCP. Apt made at [name] care center for December 24 at 9:40 pm and with Dr. Clydie Braun at [place] clinic on 12/11 at 2:15. Client reports he has transportation. RN was able to verify his current Medicaid is with [place] [facility] and that this is accepted at both offices. Francesco Runner BSN, RN Past Medical History: Past Medical History:  Diagnosis Date   Adrenal insufficiency (HCC)    AKI (acute kidney injury) (HCC)    Anxiety    Cancer (HCC)    Chronic back pain    Chronic pain    Chronic prescription opiate use    Depression    Depression    GERD (gastroesophageal reflux disease)    Hemorrhoids    Hypertension    Hypokalemia    Intermittent explosive disorder    Marijuana use    PTSD (post-traumatic stress disorder)    Pulmonary nodule    Risk for falls    Suicide attempt [facility])    prior to 02/18/14 admission    Tobacco abuse     Encounter Details:  Community Questionnaire - 01/19/23 1105       Questionnaire   Ask client: Do you give verbal consent for me to treat you today? Yes    Student Assistance N/A    Location Patient Served  [facility]    Encounter Setting CN site    Population Status Unhoused    Insurance Medicaid    Insurance/Financial Assistance Referral N/A    Medication Have Medication Insecurities    Medical Provider Yes    Screening Referrals Made N/A    Medical Referrals Made [facility] PCP/Clinic;[facility]    Medical Appointment Completed N/A    CNP Interventions Advocate/Support;Navigate Healthcare System;Case Management    Screenings CN Performed N/A    ED Visit Averted N/A    Life-Saving Intervention Made N/A

## 2023-05-16 ENCOUNTER — Other Ambulatory Visit: Payer: Self-pay

## 2023-05-16 ENCOUNTER — Emergency Department
Admission: EM | Admit: 2023-05-16 | Discharge: 2023-05-16 | Discharge status: Against Medical Advice | Attending: Emergency Medicine | Admitting: Emergency Medicine

## 2023-05-16 ENCOUNTER — Emergency Department

## 2023-05-16 DIAGNOSIS — Z5321 Procedure and treatment not carried out due to patient leaving prior to being seen by health care provider: Secondary | ICD-10-CM | POA: Diagnosis not present

## 2023-05-16 DIAGNOSIS — M545 Low back pain, unspecified: Secondary | ICD-10-CM | POA: Insufficient documentation

## 2023-05-16 DIAGNOSIS — M79641 Pain in right hand: Secondary | ICD-10-CM | POA: Diagnosis not present

## 2023-05-16 NOTE — ED Notes (Signed)
 Called pt from [facility] lobby, to be taken to treatment room, no answer from pt, pt not seen in [facility] lobby, Will call again momentary

## 2023-05-16 NOTE — ED Triage Notes (Signed)
 Pt comes with lower back pain for two-three days. Pt states no recent injuries. Pt states pain in right hand like pins and needles and in his toes. Pt denies being diabetic.   Pt denies any dizziness, cp, sob or slurred speech.

## 2023-05-16 NOTE — ED Notes (Signed)
 3rd call for pt to treatment area from [facility] lobby, no answer at this time, assume pt has left the premises, will remove from system

## 2023-05-18 ENCOUNTER — Encounter: Payer: Self-pay | Admitting: Emergency Medicine

## 2023-05-18 ENCOUNTER — Emergency Department
Admission: RE | Admit: 2023-05-18 | Discharge: 2023-05-18 | Disposition: A | Discharge status: Home / Self Care | Source: Ambulatory Visit | Attending: Emergency Medicine | Admitting: Emergency Medicine

## 2023-05-18 ENCOUNTER — Other Ambulatory Visit: Payer: Self-pay

## 2023-05-18 ENCOUNTER — Emergency Department
Admission: EM | Admit: 2023-05-18 | Discharge: 2023-05-18 | Disposition: A | Discharge status: Home / Self Care | Attending: Emergency Medicine | Admitting: Emergency Medicine

## 2023-05-18 ENCOUNTER — Emergency Department

## 2023-05-18 DIAGNOSIS — I639 Cerebral infarction, unspecified: Secondary | ICD-10-CM

## 2023-05-18 DIAGNOSIS — I10 Essential (primary) hypertension: Secondary | ICD-10-CM | POA: Diagnosis not present

## 2023-05-18 DIAGNOSIS — R200 Anesthesia of skin: Secondary | ICD-10-CM | POA: Insufficient documentation

## 2023-05-18 DIAGNOSIS — Z5329 Procedure and treatment not carried out because of patient's decision for other reasons: Secondary | ICD-10-CM | POA: Insufficient documentation

## 2023-05-18 DIAGNOSIS — G8929 Other chronic pain: Secondary | ICD-10-CM | POA: Diagnosis not present

## 2023-05-18 DIAGNOSIS — I6381 Other cerebral infarction due to occlusion or stenosis of small artery: Secondary | ICD-10-CM

## 2023-05-18 DIAGNOSIS — M545 Low back pain, unspecified: Secondary | ICD-10-CM | POA: Diagnosis not present

## 2023-05-18 HISTORY — DX: Other cerebral infarction due to occlusion or stenosis of small artery: I63.81

## 2023-05-18 LAB — COMPREHENSIVE METABOLIC PANEL WITH GFR
ALT: 14 U/L (ref 0–44)
AST: 20 U/L (ref 15–41)
Albumin: 4.1 g/dL (ref 3.5–5.0)
Alkaline Phosphatase: 81 U/L (ref 38–126)
Anion gap: 9 (ref 5–15)
BUN: 16 mg/dL (ref 6–20)
CO2: 28 mmol/L (ref 22–32)
Calcium: 9.3 mg/dL (ref 8.9–10.3)
Chloride: 103 mmol/L (ref 98–111)
Creatinine, Ser: 0.84 mg/dL (ref 0.61–1.24)
GFR, Estimated: >60 mL/min (ref >60)
Glucose, Bld: 161 mg/dL — ABNORMAL HIGH (ref 70–99)
Potassium: 3.6 mmol/L (ref 3.5–5.1)
Sodium: 140 mmol/L (ref 135–145)
Total Bilirubin: 0.8 mg/dL (ref 0.0–1.2)
Total Protein: 7 g/dL (ref 6.5–8.1)

## 2023-05-18 LAB — CBC
HCT: 47.9 % (ref 39.0–52.0)
Hemoglobin: 16.1 g/dL (ref 13.0–17.0)
MCH: 29.7 pg (ref 26.0–34.0)
MCHC: 33.6 g/dL (ref 30.0–36.0)
MCV: 88.4 fL (ref 80.0–100.0)
Platelets: 249 10*3/uL (ref 150–400)
RBC: 5.42 MIL/uL (ref 4.22–5.81)
RDW: 13.3 % (ref 11.5–15.5)
WBC: 5.3 10*3/uL (ref 4.0–10.5)
nRBC: 0 % (ref 0.0–0.2)

## 2023-05-18 LAB — ETHANOL: Alcohol, Ethyl (B): <10 mg/dL (ref <10)

## 2023-05-18 LAB — DIFFERENTIAL
Abs Immature Granulocytes: 0.01 10*3/uL (ref 0.00–0.07)
Basophils Absolute: 0.1 10*3/uL (ref 0.0–0.1)
Basophils Relative: 1 %
Eosinophils Absolute: 0.1 10*3/uL (ref 0.0–0.5)
Eosinophils Relative: 2 %
Immature Granulocytes: 0 %
Lymphocytes Relative: 24 %
Lymphs Abs: 1.2 10*3/uL (ref 0.7–4.0)
Monocytes Absolute: 0.4 10*3/uL (ref 0.1–1.0)
Monocytes Relative: 8 %
Neutro Abs: 3.4 10*3/uL (ref 1.7–7.7)
Neutrophils Relative %: 65 %

## 2023-05-18 LAB — APTT: aPTT: 34 s (ref 24–36)

## 2023-05-18 LAB — PROTIME-INR
INR: 0.9 (ref 0.8–1.2)
Prothrombin Time: 12.5 s (ref 11.4–15.2)

## 2023-05-18 MED ORDER — SODIUM CHLORIDE 0.9% FLUSH: 3.0000 mL | Freq: Once | INTRAVENOUS | Status: DC

## 2023-05-18 MED ORDER — ASPIRIN 81 MG PO CHEW: 324.0000 mg | CHEWABLE_TABLET | Freq: Once | ORAL | Status: DC

## 2023-05-18 NOTE — ED Triage Notes (Signed)
 Patient to ED via POV for lower back pain that radiates down right leg x3 days. PT states numbness and tingling in leg. Also reporting right arm numbness and tingling x3 days. States he has been dropping things with that hand. Denies weakness. Clear speech.

## 2023-05-18 NOTE — ED Notes (Signed)
 This RN saw pt walk out to lobby from room 44.

## 2023-05-18 NOTE — ED Notes (Signed)
 Pt still not in room. Informed EDP.

## 2023-05-18 NOTE — ED Provider Notes (Signed)
 [facility] Provider Note    Event Date/Time   First MD Initiated Contact with Patient 05/18/23 1132     (approximate)   History   Chief Complaint Numbness (/)   HPI  Alvin Gonzalez. is a 63 y.o. male with past medical history of hypertension, adrenal insufficiency, and chronic pain syndrome presents to the ED complaining of numbness.  Patient ports that he has had 3 days of numbness in his right arm and right leg, also states that it has felt like his right leg has given out on him at times and he has been dropping things with his right hand.  He states he has been able to walk despite this, does not feel like he has any weakness in his right arm.  He does complain of chronic lower back pain, denies radiation of pain to either lower extremity.  He has not had any urinary retention or incontinence, denies saddle anesthesia.      Physical Exam   Triage Vital Signs: ED Triage Vitals  Encounter Vitals Group     BP 05/18/23 1031 (!) 180/103     Systolic BP Percentile --      Diastolic BP Percentile --      Pulse Rate 05/18/23 1031 69     Resp 05/18/23 1031 18     Temp 05/18/23 1031 97.6 F (36.4 C)     Temp Source 05/18/23 1031 Oral     SpO2 05/18/23 1031 100 %     Weight 05/18/23 1032 140 lb (63.5 kg)     Height 05/18/23 1032 5\' 5"  (1.651 m)     Head Circumference --      Peak Flow --      Pain Score 05/18/23 1032 8     Pain Loc --      Pain Education --      Exclude from Growth Chart --     Most recent vital signs: Vitals:   05/18/23 1031  BP: (!) 180/103  Pulse: 69  Resp: 18  Temp: 97.6 F (36.4 C)  SpO2: 100%    Constitutional: Alert and oriented. Eyes: Conjunctivae are normal. Head: Atraumatic. Nose: No congestion/rhinnorhea. Mouth/Throat: Mucous membranes are moist.  Cardiovascular: Normal rate, regular rhythm. Grossly normal heart sounds.  2+ radial pulses bilaterally. Respiratory: Normal respiratory effort.  No  retractions. Lungs CTAB. Gastrointestinal: Soft and nontender. No distention. Musculoskeletal: No lower extremity tenderness nor edema.  Neurologic:  Normal speech and language. No gross focal neurologic deficits are appreciated.    ED Results / Procedures / Treatments   Labs (all labs ordered are listed, but only abnormal results are displayed) Labs Reviewed  COMPREHENSIVE METABOLIC PANEL WITH GFR - Abnormal; Notable for the following components:      Result Value   Glucose, Bld 161 (*)    All other components within normal limits  PROTIME-INR  APTT  CBC  DIFFERENTIAL  ETHANOL     EKG  ED ECG REPORT I, Chesley Noon, the attending physician, personally viewed and interpreted this ECG.   Date: 05/18/2023  EKG Time: 10:39  Rate: 72  Rhythm: normal sinus rhythm  Axis: Normal  Intervals:none  ST&T Change: None  RADIOLOGY CT head reviewed and interpreted by me with no hemorrhage or midline shift.  PROCEDURES:  Critical Care performed: No  Procedures   MEDICATIONS ORDERED IN ED: Medications - No data to display   IMPRESSION / MDM / ASSESSMENT AND PLAN / ED COURSE  I reviewed the triage vital signs and the nursing notes.                              63 y.o. male with past medical history of hypertension, adrenal insufficiency, and chronic pain syndrome who presents to the ED complaining of right-sided numbness and dropping things with his right arm for the past 3 days.  Patient's presentation is most consistent with acute presentation with potential threat to life or bodily function.  Differential diagnosis includes, but is not limited to, stroke, TIA, cervical myelopathy, cauda equina, lumbar radiculopathy, lumbar strain, chronic pain syndrome, anemia, electrolyte abnormality.  Patient nontoxic-appearing and in no acute distress, vital signs remarkable for hypertension but otherwise reassuring.  Patient has subjective decreased sensation over his right arm  and right leg but no focal weakness noted.  Presentation concerning for stroke, he does have chronic back pain but this does not account for his right arm symptoms and no findings concerning for cauda equina.  Labs without significant anemia, leukocytosis, electrolyte abnormality, or AKI.  LFTs are unremarkable.  CT head performed and concerning for subacute left thalamic stroke, which seems consistent with his presentation.  I discussed this finding with the patient and recommended he be admitted to the hospital, which he adamantly declined.  I explained the risk of worsening neurologic deficits and potentially death if he were to have a larger stroke, but patient continues to state that he does not want to be admitted, expresses understanding of the risk.  He was initially agreeable to stay for MRI and CTA, but subsequently eloped from the emergency department prior to completion of further testing.      FINAL CLINICAL IMPRESSION(S) / ED DIAGNOSES   Final diagnoses:  Cerebrovascular accident (CVA), unspecified mechanism (HCC)  Chronic midline low back pain without sciatica     Rx / DC Orders   ED Discharge Orders     None        Note:  This document was prepared using Dragon voice recognition software and may include unintentional dictation errors.   Chesley Noon, MD 05/18/23 [phone]

## 2023-05-26 NOTE — Congregational Nurse Program (Signed)
     Dept: [phone]   Congregational Nurse Program Note  Date of Encounter: 05/26/2023 [facility] Past Medical History: Past Medical History:  Diagnosis Date   Adrenal insufficiency (HCC)    AKI (acute kidney injury) (HCC)    Anxiety    Cancer (HCC)    Chronic back pain    Chronic pain    Chronic prescription opiate use    Depression    Depression    GERD (gastroesophageal reflux disease)    Hemorrhoids    Hypertension    Hypokalemia    Intermittent explosive disorder    Marijuana use    PTSD (post-traumatic stress disorder)    Pulmonary nodule    Risk for falls    Suicide attempt (HCC)    prior to 02/18/14 admission    Tobacco abuse    Encounter Details:  At [facility] today client expressed desire to speak to nurse about recent symptoms and desired BP screening. Patient states "..not taking medication and believes BP is high and he doesn't feel right today". Client complaining of intermittent SOB, right arm numbness and pins and needles in right hand and right foot with decreased mobility that "...started several weeks ago." Would not give a start time to current symptoms. States doctors office has been leaving messages on his mother's phone since he had a "stroke" a few weeks ago. He states that on April 8th he went to ER however he did not stay for testing or treatment due to "long wait in ER".   Agrees to a follow up visit with [name]. Appointment scheduled with Dr. Emi Hanson 06/01/2022 @ 1:30. BP screening performed. Recommended client go to hospital via ambulance. He declined ambulance to pick him up and bring him to hospital. Client stated he planned on riding home on moped [facility] then getting a ride from family to the ER. Patient expressed anger once discussed ambulance transportation.   [facility] called 911. Once fireman arrived and approached client, client  refused assessment and walked out to moped leaving site.   Gery Kubas RN  Gery Kubas RN

## 2023-05-27 ENCOUNTER — Other Ambulatory Visit: Payer: Self-pay

## 2023-05-27 ENCOUNTER — Observation Stay

## 2023-05-27 ENCOUNTER — Encounter: Payer: Self-pay | Admitting: Internal Medicine

## 2023-05-27 ENCOUNTER — Observation Stay
Admission: EM | Admit: 2023-05-27 | Discharge: 2023-05-29 | Disposition: A | Discharge status: Home / Self Care | Attending: Internal Medicine | Admitting: Internal Medicine

## 2023-05-27 ENCOUNTER — Emergency Department

## 2023-05-27 DIAGNOSIS — I639 Cerebral infarction, unspecified: Secondary | ICD-10-CM | POA: Diagnosis not present

## 2023-05-27 DIAGNOSIS — R112 Nausea with vomiting, unspecified: Secondary | ICD-10-CM | POA: Diagnosis not present

## 2023-05-27 DIAGNOSIS — F1910 Other psychoactive substance abuse, uncomplicated: Secondary | ICD-10-CM

## 2023-05-27 DIAGNOSIS — Z859 Personal history of malignant neoplasm, unspecified: Secondary | ICD-10-CM | POA: Insufficient documentation

## 2023-05-27 DIAGNOSIS — E876 Hypokalemia: Secondary | ICD-10-CM | POA: Insufficient documentation

## 2023-05-27 DIAGNOSIS — Z79899 Other long term (current) drug therapy: Secondary | ICD-10-CM | POA: Diagnosis not present

## 2023-05-27 DIAGNOSIS — Z720 Tobacco use: Secondary | ICD-10-CM | POA: Diagnosis present

## 2023-05-27 DIAGNOSIS — J449 Chronic obstructive pulmonary disease, unspecified: Secondary | ICD-10-CM

## 2023-05-27 DIAGNOSIS — I6521 Occlusion and stenosis of right carotid artery: Secondary | ICD-10-CM

## 2023-05-27 DIAGNOSIS — M549 Dorsalgia, unspecified: Secondary | ICD-10-CM | POA: Diagnosis not present

## 2023-05-27 DIAGNOSIS — M545 Low back pain, unspecified: Secondary | ICD-10-CM

## 2023-05-27 DIAGNOSIS — E2740 Unspecified adrenocortical insufficiency: Secondary | ICD-10-CM | POA: Diagnosis present

## 2023-05-27 DIAGNOSIS — I10 Essential (primary) hypertension: Secondary | ICD-10-CM | POA: Diagnosis present

## 2023-05-27 DIAGNOSIS — F17210 Nicotine dependence, cigarettes, uncomplicated: Secondary | ICD-10-CM | POA: Insufficient documentation

## 2023-05-27 DIAGNOSIS — R200 Anesthesia of skin: Secondary | ICD-10-CM | POA: Diagnosis present

## 2023-05-27 DIAGNOSIS — I6381 Other cerebral infarction due to occlusion or stenosis of small artery: Principal | ICD-10-CM | POA: Insufficient documentation

## 2023-05-27 DIAGNOSIS — G8929 Other chronic pain: Secondary | ICD-10-CM | POA: Insufficient documentation

## 2023-05-27 DIAGNOSIS — F418 Other specified anxiety disorders: Secondary | ICD-10-CM | POA: Diagnosis present

## 2023-05-27 DIAGNOSIS — R197 Diarrhea, unspecified: Secondary | ICD-10-CM

## 2023-05-27 DIAGNOSIS — K529 Noninfective gastroenteritis and colitis, unspecified: Secondary | ICD-10-CM | POA: Diagnosis not present

## 2023-05-27 HISTORY — DX: Other cerebral infarction due to occlusion or stenosis of small artery: I63.81

## 2023-05-27 HISTORY — DX: Other psychoactive substance abuse, uncomplicated: F19.10

## 2023-05-27 HISTORY — DX: Chronic obstructive pulmonary disease, unspecified: J44.9

## 2023-05-27 HISTORY — DX: Nausea with vomiting, unspecified: R11.2

## 2023-05-27 LAB — COMPREHENSIVE METABOLIC PANEL WITH GFR
ALT: 18 U/L (ref 0–44)
AST: 24 U/L (ref 15–41)
Albumin: 4.1 g/dL (ref 3.5–5.0)
Alkaline Phosphatase: 73 U/L (ref 38–126)
Anion gap: 8 (ref 5–15)
BUN: 16 mg/dL (ref 8–23)
CO2: 27 mmol/L (ref 22–32)
Calcium: 8.7 mg/dL — ABNORMAL LOW (ref 8.9–10.3)
Chloride: 107 mmol/L (ref 98–111)
Creatinine, Ser: 1.13 mg/dL (ref 0.61–1.24)
GFR, Estimated: >60 mL/min (ref >60)
Glucose, Bld: 105 mg/dL — ABNORMAL HIGH (ref 70–99)
Potassium: 3.5 mmol/L (ref 3.5–5.1)
Sodium: 142 mmol/L (ref 135–145)
Total Bilirubin: 1.3 mg/dL — ABNORMAL HIGH (ref 0.0–1.2)
Total Protein: 6.9 g/dL (ref 6.5–8.1)

## 2023-05-27 LAB — CBC WITH DIFFERENTIAL/PLATELET
Abs Immature Granulocytes: 0.02 10*3/uL (ref 0.00–0.07)
Basophils Absolute: 0 10*3/uL (ref 0.0–0.1)
Basophils Relative: 0 %
Eosinophils Absolute: 0.1 10*3/uL (ref 0.0–0.5)
Eosinophils Relative: 1 %
HCT: 46.3 % (ref 39.0–52.0)
Hemoglobin: 15.7 g/dL (ref 13.0–17.0)
Immature Granulocytes: 0 %
Lymphocytes Relative: 4 %
Lymphs Abs: 0.4 10*3/uL — ABNORMAL LOW (ref 0.7–4.0)
MCH: 30 pg (ref 26.0–34.0)
MCHC: 33.9 g/dL (ref 30.0–36.0)
MCV: 88.5 fL (ref 80.0–100.0)
Monocytes Absolute: 0.3 10*3/uL (ref 0.1–1.0)
Monocytes Relative: 4 %
Neutro Abs: 8.1 10*3/uL — ABNORMAL HIGH (ref 1.7–7.7)
Neutrophils Relative %: 91 %
Platelets: 255 10*3/uL (ref 150–400)
RBC: 5.23 MIL/uL (ref 4.22–5.81)
RDW: 13 % (ref 11.5–15.5)
WBC: 8.9 10*3/uL (ref 4.0–10.5)
nRBC: 0 % (ref 0.0–0.2)

## 2023-05-27 LAB — HEMOGLOBIN A1C
Hgb A1c MFr Bld: 5.2 % (ref 4.8–5.6)
Mean Plasma Glucose: 102.54 mg/dL

## 2023-05-27 LAB — LIPASE, BLOOD: Lipase: 28 U/L (ref 11–51)

## 2023-05-27 MED ORDER — GABAPENTIN 300 MG PO CAPS
400.0000 mg | ORAL_CAPSULE | Freq: Three times a day (TID) | ORAL | Status: DC
  Administered 2023-05-28 – 2023-05-29 (×5): 400 mg via ORAL
  Filled 2023-05-27 (×5): qty 1

## 2023-05-27 MED ORDER — SODIUM CHLORIDE 0.9 % IV BOLUS: 1000.0000 mL | Freq: Once | INTRAVENOUS | Status: DC

## 2023-05-27 MED ORDER — CYCLOBENZAPRINE HCL 10 MG PO TABS: 5.0000 mg | ORAL_TABLET | Freq: Three times a day (TID) | ORAL | Status: DC | PRN

## 2023-05-27 MED ORDER — CLOPIDOGREL BISULFATE 75 MG PO TABS
75.0000 mg | ORAL_TABLET | Freq: Every day | ORAL | Status: DC
  Administered 2023-05-28 – 2023-05-29 (×3): 75 mg via ORAL
  Filled 2023-05-27 (×3): qty 1

## 2023-05-27 MED ORDER — STROKE: EARLY STAGES OF RECOVERY BOOK: Freq: Once | Status: AC

## 2023-05-27 MED ORDER — ACETAMINOPHEN 325 MG PO TABS: 650.0000 mg | ORAL_TABLET | ORAL | Status: DC | PRN

## 2023-05-27 MED ORDER — PANTOPRAZOLE SODIUM 40 MG PO TBEC
40.0000 mg | DELAYED_RELEASE_TABLET | Freq: Every day | ORAL | Status: DC
  Administered 2023-05-28 – 2023-05-29 (×3): 40 mg via ORAL
  Filled 2023-05-27 (×3): qty 1

## 2023-05-27 MED ORDER — ONDANSETRON HCL 4 MG/2ML IJ SOLN
4.0000 mg | Freq: Three times a day (TID) | INTRAMUSCULAR | Status: DC | PRN
  Administered 2023-05-27: 4 mg via INTRAVENOUS
  Filled 2023-05-27: qty 2

## 2023-05-27 MED ORDER — IOHEXOL 350 MG/ML SOLN
75.0000 mL | Freq: Once | INTRAVENOUS | Status: AC | PRN
  Administered 2023-05-27: 75 mL via INTRAVENOUS

## 2023-05-27 MED ORDER — AMLODIPINE BESYLATE 10 MG PO TABS
10.0000 mg | ORAL_TABLET | Freq: Every day | ORAL | Status: DC
  Administered 2023-05-28 – 2023-05-29 (×2): 10 mg via ORAL
  Filled 2023-05-27 (×2): qty 1

## 2023-05-27 MED ORDER — HYDRALAZINE HCL 20 MG/ML IJ SOLN
5.0000 mg | INTRAMUSCULAR | Status: DC | PRN
  Administered 2023-05-27: 5 mg via INTRAVENOUS
  Filled 2023-05-27: qty 1

## 2023-05-27 MED ORDER — AMITRIPTYLINE HCL 25 MG PO TABS
25.0000 mg | ORAL_TABLET | Freq: Every day | ORAL | Status: DC
  Administered 2023-05-28 (×2): 25 mg via ORAL
  Filled 2023-05-27 (×2): qty 1

## 2023-05-27 MED ORDER — NICOTINE 21 MG/24HR TD PT24
21.0000 mg | MEDICATED_PATCH | Freq: Every day | TRANSDERMAL | Status: DC
  Filled 2023-05-27 (×3): qty 1

## 2023-05-27 MED ORDER — ACETAMINOPHEN 650 MG RE SUPP: 650.0000 mg | RECTAL | Status: DC | PRN

## 2023-05-27 MED ORDER — MORPHINE SULFATE (PF) 4 MG/ML IV SOLN
4.0000 mg | Freq: Once | INTRAVENOUS | Status: AC
  Administered 2023-05-27: 4 mg via INTRAVENOUS
  Filled 2023-05-27: qty 1

## 2023-05-27 MED ORDER — DM-GUAIFENESIN ER 30-600 MG PO TB12: 1.0000 | ORAL_TABLET | Freq: Two times a day (BID) | ORAL | Status: DC | PRN

## 2023-05-27 MED ORDER — ALBUTEROL SULFATE (2.5 MG/3ML) 0.083% IN NEBU: 2.5000 mg | INHALATION_SOLUTION | RESPIRATORY_TRACT | Status: DC | PRN

## 2023-05-27 MED ORDER — ACETAMINOPHEN 160 MG/5ML PO SOLN: 650.0000 mg | ORAL | Status: DC | PRN

## 2023-05-27 MED ORDER — TAMSULOSIN HCL 0.4 MG PO CAPS
0.8000 mg | ORAL_CAPSULE | Freq: Every day | ORAL | Status: DC
  Administered 2023-05-28 (×2): 0.8 mg via ORAL
  Filled 2023-05-27 (×2): qty 2

## 2023-05-27 MED ORDER — ATORVASTATIN CALCIUM 20 MG PO TABS
40.0000 mg | ORAL_TABLET | Freq: Every day | ORAL | Status: DC
  Administered 2023-05-28 (×2): 40 mg via ORAL
  Filled 2023-05-27 (×2): qty 2

## 2023-05-27 MED ORDER — IOHEXOL 300 MG/ML  SOLN
100.0000 mL | Freq: Once | INTRAMUSCULAR | Status: AC | PRN
  Administered 2023-05-27: 100 mL via INTRAVENOUS

## 2023-05-27 MED ORDER — ENOXAPARIN SODIUM 40 MG/0.4ML IJ SOSY
40.0000 mg | PREFILLED_SYRINGE | INTRAMUSCULAR | Status: DC
  Administered 2023-05-28 – 2023-05-29 (×2): 40 mg via SUBCUTANEOUS
  Filled 2023-05-27 (×2): qty 0.4

## 2023-05-27 MED ORDER — SODIUM CHLORIDE 0.9 % IV BOLUS
1000.0000 mL | Freq: Once | INTRAVENOUS | Status: AC
  Administered 2023-05-27: 1000 mL via INTRAVENOUS

## 2023-05-27 MED ORDER — ONDANSETRON HCL 4 MG/2ML IJ SOLN
4.0000 mg | Freq: Once | INTRAMUSCULAR | Status: AC
  Administered 2023-05-27: 4 mg via INTRAVENOUS
  Filled 2023-05-27: qty 2

## 2023-05-27 MED ORDER — AMLODIPINE BESYLATE 5 MG PO TABS
5.0000 mg | ORAL_TABLET | Freq: Once | ORAL | Status: AC
  Administered 2023-05-28: 5 mg via ORAL
  Filled 2023-05-27: qty 1

## 2023-05-27 MED ORDER — SENNOSIDES-DOCUSATE SODIUM 8.6-50 MG PO TABS: 1.0000 | ORAL_TABLET | Freq: Every evening | ORAL | Status: DC | PRN

## 2023-05-27 NOTE — ED Triage Notes (Signed)
 Pt arrives via [facility] from home for n/v/d. Pt reports sx started today. Pt c/o general aches and malaise. Pt received 4mg  zofran en route

## 2023-05-27 NOTE — ED Notes (Signed)
 Patient to MRI.

## 2023-05-27 NOTE — ED Provider Notes (Signed)
 [facility] Provider Note    Event Date/Time   First MD Initiated Contact with Patient 05/27/23 1722     (approximate)   History   Emesis and Diarrhea   HPI Alvin Gonzalez. is a 63 y.o. male with history of polysubstance abuse, COPD, HTN, GERD presenting today for nausea, vomiting, and diarrhea.  Patient states onset of symptoms shortly after waking up this morning.  Has had generalized abdominal pain since then.  Otherwise denies chest pain, shortness of breath, cough, congestion, dysuria.  Separately, patient also notes that he had ED visit recently for numbness and was told he had a stroke.  Patient left AMA from the ED and was never admitted for further stroke workup.  He denies any new numbness or tingling but still states having intermittent numbness and tingling to his left arm and left foot.  No new symptoms.     Physical Exam   Triage Vital Signs: ED Triage Vitals  Encounter Vitals Group     BP 05/27/23 1713 (!) 177/109     Systolic BP Percentile --      Diastolic BP Percentile --      Pulse Rate 05/27/23 1713 84     Resp 05/27/23 1713 18     Temp 05/27/23 1713 98 F (36.7 C)     Temp Source 05/27/23 1713 Oral     SpO2 05/27/23 1713 97 %     Weight 05/27/23 1714 140 lb (63.5 kg)     Height 05/27/23 1714 5\' 5"  (1.651 m)     Head Circumference --      Peak Flow --      Pain Score 05/27/23 1714 0     Pain Loc --      Pain Education --      Exclude from Growth Chart --     Most recent vital signs: Vitals:   05/27/23 1726 05/27/23 1752  BP:  (!) 186/101  Pulse:  80  Resp:    Temp:  98.4 F (36.9 C)  SpO2: 99% 100%   Physical Exam: I have reviewed the vital signs and nursing notes. General: Awake, alert, no acute distress.  Nontoxic appearing. Head:  Atraumatic, normocephalic.   ENT:  EOM intact, PERRL. Oral mucosa is pink and moist with no lesions. Neck: Neck is supple with full range of motion, No meningeal  signs. Cardiovascular:  RRR, No murmurs. Peripheral pulses palpable and equal bilaterally. Respiratory:  Symmetrical chest wall expansion.  No rhonchi, rales, or wheezes.  Good air movement throughout.  No use of accessory muscles.   Musculoskeletal:  No cyanosis or edema. Moving extremities with full ROM Abdomen:  Soft, generalized tenderness palpation throughout abdomen, nondistended. Neuro:  GCS 15, moving all four extremities, interacting appropriately. Speech clear.  Cranial nerves II through XII intact with no sensation deficits.  Reports sensation deficits along entire right upper extremity as well as from mid tibia down in the right lower extremity.  No sensation changes to the left lower extremity.  5 out of 5 strength throughout bilateral upper and lower extremities. Psych:  Calm, appropriate.   Skin:  Warm, dry, no rash.    ED Results / Procedures / Treatments   Labs (all labs ordered are listed, but only abnormal results are displayed) Labs Reviewed  CBC WITH DIFFERENTIAL/PLATELET - Abnormal; Notable for the following components:      Result Value   Neutro Abs 8.1 (*)    Lymphs Abs 0.4 (*)  All other components within normal limits  COMPREHENSIVE METABOLIC PANEL WITH GFR - Abnormal; Notable for the following components:   Glucose, Bld 105 (*)    Calcium 8.7 (*)    Total Bilirubin 1.3 (*)    All other components within normal limits  LIPASE, BLOOD     EKG    RADIOLOGY Independently interpreted CT abdomen/pelvis with no acute abnormalities.  Advil interpreted CT head showing evidence of the known recent CVA in the lacunar region   PROCEDURES:  Critical Care performed: No  Procedures   MEDICATIONS ORDERED IN ED: Medications  ondansetron (ZOFRAN) injection 4 mg (4 mg Intravenous Given 05/27/23 1750)  sodium chloride 0.9 % bolus 1,000 mL (0 mLs Intravenous Stopped 05/27/23 1849)  morphine (PF) 4 MG/ML injection 4 mg (4 mg Intravenous Given 05/27/23 1750)   iohexol (OMNIPAQUE) 300 MG/ML solution 100 mL (100 mLs Intravenous Contrast Given 05/27/23 1805)     IMPRESSION / MDM / ASSESSMENT AND PLAN / ED COURSE  I reviewed the triage vital signs and the nursing notes.                              Differential diagnosis includes, but is not limited to, viral gastroenteritis, colitis, enteritis, diverticulitis, appendicitis, SBO, ongoing CVA  Patient's presentation is most consistent with acute complicated illness / injury requiring diagnostic workup.  Patient is a 63 year old male presenting today primarily for nausea, vomiting, and diarrhea.  Generalized tenderness to palpation throughout abdomen on exam otherwise vital signs are stable.  Laboratory workup with unremarkable CBC, CMP, and lipase.  CT abdomen/pelvis shows no acute intra-abdominal pathology.  He was treated with 1 L fluids, morphine, and Zofran.  CT head still showing area of concern for possible lacunar infarct.  Given ongoing weakness with difficulty using his hand, patient is agreeable to be admitted to hospitalist at this time.  Admitted to hospitalist for ongoing stroke workup.  The patient is on the cardiac monitor to evaluate for evidence of arrhythmia and/or significant heart rate changes.     FINAL CLINICAL IMPRESSION(S) / ED DIAGNOSES   Final diagnoses:  Cerebrovascular accident (CVA), unspecified mechanism (HCC)  Nausea vomiting and diarrhea     Rx / DC Orders   ED Discharge Orders     None        Note:  This document was prepared using Dragon voice recognition software and may include unintentional dictation errors.   Kandee Orion, MD 05/27/23 2051

## 2023-05-27 NOTE — H&P (Signed)
 History and Physical    Alvin Gonzalez. RUE:[id number] DOB: Oct 13, 1960 DOA: 05/27/2023  Referring MD/NP/PA:   PCP: Alvin Gold, MD   Patient coming from:  The patient is coming from home.     Chief Complaint: Numbness and tingling in right arm and right foot, nausea, vomiting, diarrhea and abdominal pain  HPI: Alvin Gonzalez. is a 63 y.o. male with medical history significant of polysubstance abuse (tobacco, cocaine, marijuana), hypertension, COPD, GERD, GI bleeding, depression with anxiety, BPH, PTSD, adrenal insufficiency, chronic pain, medication noncompliance, who presents with numbness, tingling in right arm and right foot, nausea, vomiting, diarrhea and abdominal pain.  Patient was seen in the ED 05/18/23 due to numbness and tingling in right arm and right foot.  Patient had CT of the head which showed left thalamocapsular lacunar infarct not clearly seen on recent prior, suspecting acute infarct. Pt left hospital AMA.  He states that his symptoms has been persistent.  He still has numbness and tingling mainly in right hand fingers and right foot toes.  No weakness in extremities.  No facial droop or slurred speech. He reports nausea vomiting, diarrhea and abdominal pain started this morning.  He has had multiple episodes of nonbilious nonbloody vomiting and about 5 times of watery diarrhea today.  He also has generalized diffuse crampy abdominal pain.  No fever or chills.  No symptoms of UTI. Patient states that he is not taking any medications for a while.  He wants to restart all his home medications.  Data reviewed independently and ED Course: pt was found to have WBC 8.9, GFR > 60, temperature normal, blood pressure 186/101, heart rate 84, RR 18, oxygen saturation 100% on room air.  Patient is placed in telemetry bed for observation.  CT of head: Stable area of decreased attenuation in the lateral aspect of the left thalamus suspicious for lacunar infarct. No significant  interval change is noted.  MRI-brain 1. 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 2. Moderate chronic microvascular ischemic disease and remote left thalamocapsular infarct.  CTA of neck and head 1. No emergent large vessel occlusion. 2. Approximately 80% stenosis of the right ICA origin. 3. Aortic Atherosclerosis (ICD10-I70.0) and Emphysema (ICD10-J43.9).  EKG:   Not done in ED, will get one.     Review of Systems:   General: no fevers, chills, no body weight gain, has fatigue HEENT: no blurry vision, hearing changes or sore throat Respiratory: no dyspnea, coughing, wheezing CV: no chest pain, no palpitations GI: has nausea, vomiting, abdominal pain, diarrhea, no constipation GU: no dysuria, burning on urination, increased urinary frequency, hematuria  Ext: no leg edema Neuro:  no vision change or hearing loss. Has numbness and tingling in right arm and right foot Skin: no rash, no skin tear. MSK: No muscle spasm, no deformity, no limitation of range of movement in spin Heme: No easy bruising.  Travel history: No recent long distant travel.   Allergy:  Allergies  Allergen Reactions   Carbamazepine Rash and Hives   Ibuprofen Nausea And Vomiting and Rash    GI Bleeding    Risperidone  Swelling    Tongue swelling Tongue swelling Tongue swelling Tongue swelling? Pt self report   Naproxen  Other (See Comments)    stomach bleeding stomach bleeding stomach bleeding    Past Medical History:  Diagnosis Date   Adrenal insufficiency (HCC)    AKI (acute kidney injury) (HCC)    Anxiety    Cancer (HCC)  Chronic back pain    Chronic pain    Chronic prescription opiate use    Depression    Depression    GERD (gastroesophageal reflux disease)    Hemorrhoids    Hypertension    Hypokalemia    Intermittent explosive disorder    Marijuana use    Nausea vomiting and diarrhea 05/27/2023   PTSD (post-traumatic stress disorder)    Pulmonary nodule    Risk for  falls    Suicide attempt [facility])    prior to 02/18/14 admission    Tobacco abuse     Past Surgical History:  Procedure Laterality Date   CHEST TUBE INSERTION     COLONOSCOPY WITH PROPOFOL  N/A 05/14/2021   Procedure: COLONOSCOPY WITH PROPOFOL ;  Surgeon: Luke Salaam, MD;  Location: [facility] ENDOSCOPY;  Service: Gastroenterology;  Laterality: N/A;   EVALUATION UNDER ANESTHESIA WITH HEMORRHOIDECTOMY N/A 09/10/2021   Procedure: EXAM UNDER ANESTHESIA WITH HEMORRHOIDECTOMY of 2+ columns;  Surgeon: Emmalene Hare, MD;  Location: [facility] ORS;  Service: General;  Laterality: N/A;  Provider requesting 1.5 hours / 90 minutes for procedure.   LYMPH NODE BIOPSY      Social History:  reports that he has been smoking cigarettes. He has a 45 pack-year smoking history. He quit smokeless tobacco use about 8 years ago. He reports current alcohol use. He reports current drug use. Drugs: Marijuana and Cocaine.  Family History:  Family History  Problem Relation Age of Onset   Diabetes Mother    Hypertension Mother    Other Mother        possible ovarian cancer   Heart disease Father    Hypertension Father    Cirrhosis Father    Liver cancer Father    Lung cancer Sister    Heart attack Maternal Grandmother    Suicidality Maternal Grandfather    Other Paternal Grandmother        unknown medical history   Suicidality Paternal Grandfather    Stomach cancer Cousin      Prior to Admission medications   Medication Sig Start Date End Date Taking? Authorizing Provider  acetaminophen  (TYLENOL ) 500 MG tablet Take 1,000 mg by mouth every 6 (six) hours as needed. Patient not taking: Reported on 05/27/2023    [provider]  albuterol  (VENTOLIN  HFA) 108 (90 Base) MCG/ACT inhaler Inhale 1-2 puffs into the lungs every 6 (six) hours as needed for wheezing or shortness of breath. Patient not taking: Reported on 05/27/2023 08/27/21   Iloabachie, Chioma E, NP  albuterol  (VENTOLIN  HFA) 108 (90 Base) MCG/ACT inhaler Inhale  into the lungs. Patient not taking: Reported on 04/19/2022 08/27/21   [provider]  albuterol  (VENTOLIN  HFA) 108 (90 Base) MCG/ACT inhaler Inhale 2 inhalations into the lungs every 6 (six) hours as needed Patient not taking: Reported on 05/27/2023 11/11/21     amitriptyline  (ELAVIL ) 25 MG tablet Take 1 tablet (25 mg total) by mouth at bedtime Patient not taking: Reported on 05/27/2023 11/11/21     amLODipine  (NORVASC ) 10 MG tablet Take 1 tablet (10 mg total) by mouth daily. Patient not taking: Reported on 05/27/2023 11/11/21     cyclobenzaprine  (FLEXERIL ) 5 MG tablet Take 1 tablet (5 mg total) by mouth 3 (three) times daily as needed. Patient not taking: Reported on 05/27/2023 12/22/22   Menshew, Raye Cai, PA-C  gabapentin  (NEURONTIN ) 400 MG capsule Take 1 capsule (400 mg total) by mouth 3 (three) times daily. Patient not taking: Reported on 04/19/2022 08/27/21   Iloabachie,  Chioma E, NP  hydrochlorothiazide  (HYDRODIURIL ) 25 MG tablet Take 1 tablet (25 mg total) by mouth once daily Patient not taking: Reported on 05/27/2023 12/12/21     hydrocortisone  (ANUSOL -HC) 2.5 % rectal cream Place 1 Application rectally 2 (two) times daily. Patient not taking: Reported on 05/27/2023 09/24/21   Emmalene Hare, MD  lisinopril  (ZESTRIL ) 30 MG tablet Take 1 tablet (30 mg total) by mouth daily. Patient not taking: Reported on 04/19/2022 12/09/21     lisinopril  (ZESTRIL ) 40 MG tablet Take 1 tablet (40 mg total) by mouth daily. Patient not taking: Reported on 05/27/2023 11/11/21     lisinopril  (ZESTRIL ) 40 MG tablet Take 1 tablet (40 mg total) by mouth daily. Patient not taking: Reported on 05/27/2023 12/12/21     pantoprazole  (PROTONIX ) 40 MG tablet Take 1 tablet (40 mg total) by mouth once daily Patient not taking: Reported on 05/27/2023 11/11/21     tamsulosin  (FLOMAX ) 0.4 MG CAPS capsule Take 2 capsules (0.8 mg total) by mouth daily. Patient not taking: Reported on 05/27/2023 10/08/21   Geraline Knapp, MD   DULoxetine  (CYMBALTA ) 30 MG capsule Take 1 capsule (30 mg total) by mouth daily for 7 days, THEN 2 capsules (60 mg total) daily 10/23/21 01/12/22      Physical Exam: Vitals:   05/27/23 2145 05/27/23 2200 05/27/23 2230 05/28/23 0009  BP: (!) 181/96 (!) 155/96 (!) 160/91 (!) 164/97  Pulse:   88 83  Resp:   18 18  Temp:    98.3 F (36.8 C)  TempSrc:    Oral  SpO2:   98% 98%  Weight:      Height:       General: Not in acute distress.  Dry mucous membrane HEENT:       Eyes: PERRL, EOMI, no jaundice       ENT: No discharge from the ears and nose, no pharynx injection, no tonsillar enlargement.        Neck: No JVD, no bruit, no mass felt. Heme: No neck lymph node enlargement. Cardiac: S1/S2, RRR, No murmurs, No gallops or rubs. Respiratory: No rales, wheezing, rhonchi or rubs. GI: Soft, nondistended, mild diffuse tenderness, no rebound pain, no organomegaly, BS present. GU: No hematuria Ext: No pitting leg edema bilaterally. 1+DP/PT pulse bilaterally. Musculoskeletal: No joint deformities, No joint redness or warmth, no limitation of ROM in spin. Skin: No rashes.  Neuro: Alert, oriented X3, cranial nerves II-XII grossly intact, moves all extremities normally. Muscle strength 5/5 in all extremities, sensation to light touch decreased in right hand and the right foot.  Psych: Patient is not psychotic, no suicidal or hemocidal ideation.  Labs on Admission: I have personally reviewed following labs and imaging studies  CBC: Recent Labs  Lab 05/27/23 1716  WBC 8.9  NEUTROABS 8.1*  HGB 15.7  HCT 46.3  MCV 88.5  PLT 255   Basic Metabolic Panel: Recent Labs  Lab 05/27/23 1716  NA 142  K 3.5  CL 107  CO2 27  GLUCOSE 105*  BUN 16  CREATININE 1.13  CALCIUM  8.7*   GFR: Estimated Creatinine Clearance: 59.7 mL/min (by C-G formula based on SCr of 1.13 mg/dL). Liver Function Tests: Recent Labs  Lab 05/27/23 1716  AST 24  ALT 18  ALKPHOS 73  BILITOT 1.3*  PROT 6.9   ALBUMIN  4.1   Recent Labs  Lab 05/27/23 1716  LIPASE 28   No results for input(s): "AMMONIA" in the last 168 hours. Coagulation Profile: No results for  input(s): "INR", "PROTIME" in the last 168 hours. Cardiac Enzymes: No results for input(s): "CKTOTAL", "CKMB", "CKMBINDEX", "TROPONINI" in the last 168 hours. BNP (last 3 results) No results for input(s): "PROBNP" in the last 8760 hours. HbA1C: Recent Labs    05/27/23 1716  HGBA1C 5.2   CBG: No results for input(s): "GLUCAP" in the last 168 hours. Lipid Profile: No results for input(s): "CHOL", "HDL", "LDLCALC", "TRIG", "CHOLHDL", "LDLDIRECT" in the last 72 hours. Thyroid Function Tests: No results for input(s): "TSH", "T4TOTAL", "FREET4", "T3FREE", "THYROIDAB" in the last 72 hours. Anemia Panel: No results for input(s): "VITAMINB12", "FOLATE", "FERRITIN", "TIBC", "IRON", "RETICCTPCT" in the last 72 hours. Urine analysis:    Component Value Date/Time   COLORURINE YELLOW (A) 07/28/2021 2017   APPEARANCEUR Clear 09/05/2021 0838   LABSPEC 1.017 07/28/2021 2017   LABSPEC 1.004 02/18/2014 0914   PHURINE 7.0 07/28/2021 2017   GLUCOSEU Negative 09/05/2021 0838   GLUCOSEU Negative 02/18/2014 0914   HGBUR LARGE (A) 07/28/2021 2017   BILIRUBINUR Negative 09/05/2021 0838   BILIRUBINUR Negative 02/18/2014 0914   KETONESUR NEGATIVE 07/28/2021 2017   PROTEINUR Negative 09/05/2021 0838   PROTEINUR 100 (A) 07/28/2021 2017   UROBILINOGEN 0.2 06/19/2021 1112   UROBILINOGEN 0.2 02/18/2014 1223   NITRITE Negative 09/05/2021 0838   NITRITE NEGATIVE 07/28/2021 2017   LEUKOCYTESUR Negative 09/05/2021 0838   LEUKOCYTESUR NEGATIVE 07/28/2021 2017   LEUKOCYTESUR Negative 02/18/2014 0914   Sepsis Labs: @LABRCNTIP (procalcitonin:4,lacticidven:4) )No results found for this or any previous visit (from the past 240 hours).   Radiological Exams on Admission:   Assessment/Plan Principal Problem:   Stroke [facility]) Active Problems:   Nausea  vomiting and diarrhea   HTN (hypertension)   COPD (chronic obstructive pulmonary disease) (HCC)   Adrenal insufficiency (HCC)   Depression with anxiety   Tobacco abuse   Polysubstance abuse (HCC)   Assessment and Plan:  Stroke [facility]): MRI showed acute perforator infarct in the left basal ganglia. CTA negative for LOV.  -Placed on tele bed for observation - pt does not need permissive hypertension since he is out of window for permissive hypertension - Plavix  75 mg daily  - Patient has high risk for GI bleeding using NSAIDs.  Will not start aspirin .   - Statin: Lipitor 40 mg daily - fasting lipid panel and HbA1c  - 2D transthoracic echocardiography  - swallowing screen. If fails, will get SLP - Check UDS  - PT/OT consult  Nausea vomiting and diarrhea -check c diff and GI path   HTN (hypertension) -Restart amlodipine  10 mg daily - IV hydralazine  as needed  COPD (chronic obstructive pulmonary disease) (HCC): Stable -Bronchodilators as needed Mucinex   History of adrenal insufficiency (HCC): Blood pressure 186/101, will not give stress dose steroid. -Open up closely  Depression with anxiety -Restart amitriptyline   Tobacco abuse and  Polysubstance abuse (HCC) - Due to counseled about importance of quitting substance use - Nicotine  patch - Check UDS    DVT ppx: SQ Lovenox   Code Status: DNR (I discussed with patient and explained the meaning of CODE STATUS. Patient wants to be DNR)  Family Communication:     not done, no family member is at bed side.     Disposition Plan:  Anticipate discharge back to previous environment  Consults called:  none  Admission status and Level of care: Telemetry Medical:    for obs    Dispo: The patient is from: Home2  Anticipated d/c is to: Home              Anticipated d/c date is: 1 day              Patient currently is not medically stable to d/c.    Severity of Illness:  The appropriate patient status for this  patient is OBSERVATION. Observation status is judged to be reasonable and necessary in order to provide the required intensity of service to ensure the patient's safety. The patient's presenting symptoms, physical exam findings, and initial radiographic and laboratory data in the context of their medical condition is felt to place them at decreased risk for further clinical deterioration. Furthermore, it is anticipated that the patient will be medically stable for discharge from the hospital within 2 midnights of admission.        Date of Service 05/28/2023    Miracle Mongillo [facility]   If 7PM-7AM, please contact night-coverage [email or web address] 05/28/2023, 2:34 AM

## 2023-05-28 ENCOUNTER — Observation Stay
Admit: 2023-05-28 | Discharge: 2023-05-28 | Disposition: A | Discharge status: Home / Self Care | Attending: Internal Medicine | Admitting: Internal Medicine

## 2023-05-28 ENCOUNTER — Observation Stay (HOSPITAL_BASED_OUTPATIENT_CLINIC_OR_DEPARTMENT_OTHER)
Admit: 2023-05-28 | Discharge: 2023-05-28 | Disposition: A | Discharge status: Home / Self Care | Attending: Internal Medicine | Admitting: Internal Medicine

## 2023-05-28 DIAGNOSIS — I6521 Occlusion and stenosis of right carotid artery: Secondary | ICD-10-CM | POA: Diagnosis not present

## 2023-05-28 DIAGNOSIS — I6381 Other cerebral infarction due to occlusion or stenosis of small artery: Secondary | ICD-10-CM | POA: Diagnosis not present

## 2023-05-28 DIAGNOSIS — I63532 Cerebral infarction due to unspecified occlusion or stenosis of left posterior cerebral artery: Secondary | ICD-10-CM | POA: Diagnosis not present

## 2023-05-28 DIAGNOSIS — F1990 Other psychoactive substance use, unspecified, uncomplicated: Secondary | ICD-10-CM | POA: Diagnosis not present

## 2023-05-28 DIAGNOSIS — I6389 Other cerebral infarction: Secondary | ICD-10-CM | POA: Diagnosis not present

## 2023-05-28 DIAGNOSIS — R112 Nausea with vomiting, unspecified: Secondary | ICD-10-CM | POA: Diagnosis not present

## 2023-05-28 DIAGNOSIS — R197 Diarrhea, unspecified: Secondary | ICD-10-CM | POA: Diagnosis not present

## 2023-05-28 HISTORY — DX: Occlusion and stenosis of right carotid artery: I65.21

## 2023-05-28 LAB — ECHOCARDIOGRAM COMPLETE
AV Peak grad: 3.6 mmHg
Ao pk vel: 0.96 m/s
Area-P 1/2: 4.29 cm2
Height: 65 in
Weight: 2240 [oz_av]

## 2023-05-28 LAB — BASIC METABOLIC PANEL WITH GFR
Anion gap: 7 (ref 5–15)
BUN: 15 mg/dL (ref 8–23)
CO2: 24 mmol/L (ref 22–32)
Calcium: 8.4 mg/dL — ABNORMAL LOW (ref 8.9–10.3)
Chloride: 105 mmol/L (ref 98–111)
Creatinine, Ser: 0.97 mg/dL (ref 0.61–1.24)
GFR, Estimated: >60 mL/min (ref >60)
Glucose, Bld: 102 mg/dL — ABNORMAL HIGH (ref 70–99)
Potassium: 3.4 mmol/L — ABNORMAL LOW (ref 3.5–5.1)
Sodium: 136 mmol/L (ref 135–145)

## 2023-05-28 LAB — LIPID PANEL
Cholesterol: 140 mg/dL (ref 0–200)
HDL: 57 mg/dL (ref >40)
LDL Cholesterol: 72 mg/dL (ref 0–99)
Total CHOL/HDL Ratio: 2.5 ratio
Triglycerides: 54 mg/dL (ref <150)
VLDL: 11 mg/dL (ref 0–40)

## 2023-05-28 LAB — URINE DRUG SCREEN, QUALITATIVE (ARMC ONLY)
Amphetamines, Ur Screen: NOT DETECTED
Barbiturates, Ur Screen: NOT DETECTED
Benzodiazepine, Ur Scrn: NOT DETECTED
Cannabinoid 50 Ng, Ur ~~LOC~~: POSITIVE — AB
Cocaine Metabolite,Ur ~~LOC~~: POSITIVE — AB
MDMA (Ecstasy)Ur Screen: NOT DETECTED
Methadone Scn, Ur: NOT DETECTED
Opiate, Ur Screen: POSITIVE — AB
Phencyclidine (PCP) Ur S: NOT DETECTED
Tricyclic, Ur Screen: POSITIVE — AB

## 2023-05-28 LAB — MAGNESIUM: Magnesium: 2.1 mg/dL (ref 1.7–2.4)

## 2023-05-28 LAB — PROTIME-INR
INR: 1 (ref 0.8–1.2)
Prothrombin Time: 13.6 s (ref 11.4–15.2)

## 2023-05-28 LAB — HIV ANTIBODY (ROUTINE TESTING W REFLEX): HIV Screen 4th Generation wRfx: NONREACTIVE

## 2023-05-28 LAB — APTT: aPTT: 33 s (ref 24–36)

## 2023-05-28 MED ORDER — ATORVASTATIN CALCIUM 20 MG PO TABS
80.0000 mg | ORAL_TABLET | Freq: Every day | ORAL | Status: DC
  Administered 2023-05-29: 80 mg via ORAL
  Filled 2023-05-28: qty 4

## 2023-05-28 MED ORDER — POTASSIUM CHLORIDE CRYS ER 20 MEQ PO TBCR
40.0000 meq | EXTENDED_RELEASE_TABLET | ORAL | Status: AC
  Administered 2023-05-28 (×2): 40 meq via ORAL
  Filled 2023-05-28 (×2): qty 2

## 2023-05-28 NOTE — Progress Notes (Signed)
 Received report. Patient resting in bed. Call bell in reach. Assessment to follow.

## 2023-05-28 NOTE — Progress Notes (Signed)
 Introduced patient to role of Statistician. Intake questions completed.  Patient lives alone in a rented apartment. He receives SSDI. He does not have a vehicle, but does ride a scooter for transportation. When asked about having enough food to eat, patient stated "I eat one good meal a day." Will add food resources to AVS. Patient does admit to using much of monthly funds, after paying rent, to support his "crack" cocaine habit. Patient uses by smoking. He denies any IV drug use. States he used to snort cocaine, but had a reconstruction surgery involving his nose years ago and has been unable to do so since.  Patient attributes his drug use to chronic, severe back pain. 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 Patient states if he had some sort of pain management he would no longer use cocaine.  Will reach out to attending to see about obtaining an outpatient referral to pain management. Patient does have an extensive incarceration history. With his most recent stint being for 5 years with release in December 2023. Provided with information for [employer]. Patient encouraged to call with questions or concerns.

## 2023-05-28 NOTE — Progress Notes (Signed)
 SLP Cancellation Note  Patient Details Name: Alvin Gonzalez. MRN: [id number] DOB: November 17, 1960   Cancelled treatment:       Reason Eval/Treat Not Completed: SLP screened, no needs identified, will sign off (chart reviewed; consulted NSG and met w/ pt)   Per chart notes, pt is a 63 y.o. male with medical history significant of polysubstance abuse (tobacco, cocaine, marijuana), hypertension, COPD, GERD, GI bleeding, depression with anxiety, BPH, PTSD, adrenal insufficiency, chronic pain, medication noncompliance, who presents with numbness, tingling in right arm and right foot, nausea, vomiting, diarrhea and abdominal pain. No facial droop or slurred speech.   At bedside, pt denied any difficulty swallowing and is currently on a regular diet; tolerates swallowing pills w/ water per NSG. Pt conversed in basic conversation w/out expressive/receptive deficits noted; pt denied any speech-language deficits. Speech intelligible but muttered at times as he was waking up then turning over in bed again to sleep. Also noted Missing Dentition.   No further skilled ST services indicated as pt appears at his functional communication baseline. Pt agreed. NSG to reconsult if any change in status while admitted.     Darla Alann, MS, CCC-SLP Speech Language Pathologist Rehab Services; [facility] [phone] (ascom) Kadiatou Oplinger 05/28/2023, 8:29 AM

## 2023-05-28 NOTE — Consult Note (Signed)
 NEUROLOGY CONSULT NOTE   Date of service: May 28, 2023 Patient Name: Alvin Gonzalez. MRN:  [id number] DOB:  1960/07/16 Chief Complaint: "R sided numbness in R arm and R leg" Requesting Provider: Donaciano Frizzle, MD  History of Present Illness  Alvin Helmes. is a 63 y.o. male with hx of polysubstance use, HTN, COPD, GERD, GI bleed who was seen in the ED on 05/18/23 for R sided numbness with CT Head concerning for a L thalamocapsular infarct suspected to be acute. MRI Brain and further workup recommended and patient left AMA. He comes back with persistent numbness. 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  Neurology consulted for further evaluation and workup. He endorses using cocaine and marijuana and smoking about half a pack a day. Drinks a little bit of alcohol. Does not have a PCP and has not seen a doctor in a really long time.  No prior hx of strokes.  He reports that he initially came into the ED on 6 April.  He waited for a few hours but given extended wait times, he left.  He came again on 8 April and after getting the CT head, he left again because it was going to be a really long time before he will get the MRI.  He came in again yesterday as his right-sided numbness was persistent and he has been intermittently dropping things from his right hand.  LKW:05/15/23 Modified rankin score: 0-Completely asymptomatic and back to baseline post- stroke IV Thrombolysis: Not offered, he is outside the window.   EVT: Not offered, no LVO.    NIHSS components Score: Comment  1a Level of Conscious 0[x]  1[]  2[]  3[]      1b LOC Questions 0[x]  1[]  2[]       1c LOC Commands 0[x]  1[]  2[]       2 Best Gaze 0[x]  1[]  2[]       3 Visual 0[x]  1[]  2[]  3[]      4 Facial Palsy 0[x]  1[]  2[]  3[]      5a Motor Arm - left 0[x]  1[]  2[]  3[]  4[]  UN[]    5b Motor Arm - Right 0[x]  1[]  2[]  3[]  4[]  UN[]    6a Motor Leg - Left 0[x]  1[]  2[]  3[]  4[]  UN[]    6b Motor Leg - Right 0[x]  1[]  2[]  3[]   4[]  UN[]    7 Limb Ataxia 0[x]  1[]  2[]  3[]  UN[]     8 Sensory 0[x]  1[]  2[]  UN[]      9 Best Language 0[x]  1[]  2[]  3[]      10 Dysarthria 0[x]  1[]  2[]  UN[]      11 Extinct. and Inattention 0[x]  1[]  2[]       TOTAL: 0      ROS  Comprehensive ROS performed and pertinent positives documented in HPI   Past History   Past Medical History:  Diagnosis Date   Adrenal insufficiency (HCC)    AKI (acute kidney injury) (HCC)    Anxiety    Cancer (HCC)    Chronic back pain    Chronic pain    Chronic prescription opiate use    Depression    Depression    GERD (gastroesophageal reflux disease)    Hemorrhoids    Hypertension    Hypokalemia    Intermittent explosive disorder    Marijuana use    Nausea vomiting and diarrhea 05/27/2023   PTSD (post-traumatic stress disorder)    Pulmonary nodule    Risk for falls    Suicide attempt (HCC)    prior to  02/18/14 admission    Tobacco abuse     Past Surgical History:  Procedure Laterality Date   CHEST TUBE INSERTION     COLONOSCOPY WITH PROPOFOL  N/A 05/14/2021   Procedure: COLONOSCOPY WITH PROPOFOL ;  Surgeon: Luke Salaam, MD;  Location: [facility] ENDOSCOPY;  Service: Gastroenterology;  Laterality: N/A;   EVALUATION UNDER ANESTHESIA WITH HEMORRHOIDECTOMY N/A 09/10/2021   Procedure: EXAM UNDER ANESTHESIA WITH HEMORRHOIDECTOMY of 2+ columns;  Surgeon: Emmalene Hare, MD;  Location: [facility] ORS;  Service: General;  Laterality: N/A;  Provider requesting 1.5 hours / 90 minutes for procedure.   LYMPH NODE BIOPSY      Family History: Family History  Problem Relation Age of Onset   Diabetes Mother    Hypertension Mother    Other Mother        possible ovarian cancer   Heart disease Father    Hypertension Father    Cirrhosis Father    Liver cancer Father    Lung cancer Sister    Heart attack Maternal Grandmother    Suicidality Maternal Grandfather    Other Paternal Grandmother        unknown medical history   Suicidality Paternal Grandfather    Stomach  cancer Cousin     Social History  reports that he has been smoking cigarettes. He has a 45 pack-year smoking history. He quit smokeless tobacco use about 8 years ago. He reports current alcohol use. He reports current drug use. Drugs: Marijuana and Cocaine.  Allergies  Allergen Reactions   Carbamazepine Rash and Hives   Ibuprofen Nausea And Vomiting and Rash    GI Bleeding    Risperidone  Swelling    Tongue swelling Tongue swelling Tongue swelling Tongue swelling? Pt self report   Naproxen  Other (See Comments)    stomach bleeding stomach bleeding stomach bleeding    Medications   Current Facility-Administered Medications:    acetaminophen  (TYLENOL ) tablet 650 mg, 650 mg, Oral, Q4H PRN **OR** acetaminophen  (TYLENOL ) 160 MG/5ML solution 650 mg, 650 mg, Per Tube, Q4H PRN **OR** acetaminophen  (TYLENOL ) suppository 650 mg, 650 mg, Rectal, Q4H PRN, Niu, Xilin, MD   albuterol  (PROVENTIL ) (2.5 MG/3ML) 0.083% nebulizer solution 2.5 mg, 2.5 mg, Inhalation, Q4H PRN, Niu, Xilin, MD   amitriptyline  (ELAVIL ) tablet 25 mg, 25 mg, Oral, QHS, Niu, Xilin, MD, 25 mg at 05/28/23 0020   amLODipine  (NORVASC ) tablet 10 mg, 10 mg, Oral, Daily, Niu, Xilin, MD, 10 mg at 05/28/23 1002   atorvastatin  (LIPITOR) tablet 40 mg, 40 mg, Oral, Daily, Niu, Xilin, MD, 40 mg at 05/28/23 1002   clopidogrel  (PLAVIX ) tablet 75 mg, 75 mg, Oral, Daily, Niu, Xilin, MD, 75 mg at 05/28/23 1002   cyclobenzaprine  (FLEXERIL ) tablet 5 mg, 5 mg, Oral, TID PRN, Coretta Dexter, RPH   dextromethorphan-guaiFENesin  (MUCINEX  DM) 30-600 MG per 12 hr tablet 1 tablet, 1 tablet, Oral, BID PRN, Niu, Xilin, MD   enoxaparin  (LOVENOX ) injection 40 mg, 40 mg, Subcutaneous, Q24H, Niu, Xilin, MD, 40 mg at 05/28/23 1008   gabapentin  (NEURONTIN ) capsule 400 mg, 400 mg, Oral, TID, Niu, Xilin, MD, 400 mg at 05/28/23 1002   hydrALAZINE  (APRESOLINE ) injection 5 mg, 5 mg, Intravenous, Q2H PRN, Niu, Xilin, MD, 5 mg at 05/27/23 2146   nicotine  (NICODERM  CQ - dosed in mg/24 hours) patch 21 mg, 21 mg, Transdermal, Daily, Niu, Xilin, MD   ondansetron  (ZOFRAN ) injection 4 mg, 4 mg, Intravenous, Q8H PRN, Niu, Xilin, MD, 4 mg at 05/27/23 2138   pantoprazole  (PROTONIX ) EC tablet 40  mg, 40 mg, Oral, Daily, Niu, Xilin, MD, 40 mg at 05/28/23 1002   senna-docusate (Senokot-S) tablet 1 tablet, 1 tablet, Oral, QHS PRN, Niu, Xilin, MD   sodium chloride  0.9 % bolus 1,000 mL, 1,000 mL, Intravenous, Once, Niu, Xilin, MD, Stopped at 05/28/23 0020   tamsulosin  (FLOMAX ) capsule 0.8 mg, 0.8 mg, Oral, q1800, Niu, Xilin, MD, 0.8 mg at 05/28/23 0020  Vitals   Vitals:   05/28/23 0009 05/28/23 0451 05/28/23 0834 05/28/23 1140  BP: (!) 164/97 132/76 (!) 137/91 119/68  Pulse: 83 97 90 92  Resp: 18 18 18 18   Temp: 98.3 F (36.8 C) 98 F (36.7 C) 97.9 F (36.6 C) 97.8 F (36.6 C)  TempSrc: Oral Oral Oral   SpO2: 98% 95% 98% 97%  Weight:      Height:        Body mass index is 23.3 kg/m.  Physical Exam  General: Laying comfortably in bed; in no acute distress.  HENT: Normal oropharynx and mucosa. Normal external appearance of ears and nose.  Neck: Supple, no pain or tenderness  CV: No JVD. No peripheral edema.  Pulmonary: Symmetric Chest rise. Normal respiratory effort.  Abdomen: Soft to touch, non-tender.  Ext: No cyanosis, edema, or deformity  Skin: No rash. Normal palpation of skin.   Musculoskeletal: Normal digits and nails by inspection. No clubbing.   Neurologic Examination  Mental status/Cognition: Alert, oriented to self, place, month and year, good attention.  Speech/language: Fluent, comprehension intact, object naming intact, repetition intact.  Cranial nerves:   CN II Pupils equal and reactive to light, no VF deficits    CN III,IV,VI EOM intact, no gaze preference or deviation, no nystagmus    CN V normal sensation in V1, V2, and V3 segments bilaterally    CN VII no asymmetry, no nasolabial fold flattening    CN VIII normal hearing to  speech    CN IX & X normal palatal elevation, no uvular deviation    CN XI 5/5 head turn and 5/5 shoulder shrug bilaterally    CN XII midline tongue protrusion    Motor:  Muscle bulk: normal, tone normal, pronator drift noted in RUE. tremor none Mvmt Root Nerve  Muscle Right Left Comments  SA C5/6 Ax Deltoid 5 5   EF C5/6 Mc Biceps 5 5   EE C6/7/8 Rad Triceps 5 5   WF C6/7 Med FCR     WE C7/8 PIN ECU     F Ab C8/T1 U ADM/FDI 5 5   HF L1/2/3 Fem Illopsoas 5 5   KE L2/3/4 Fem Quad 5 5   DF L4/5 D Peron Tib Ant 5 5   PF S1/2 Tibial Grc/Sol 5 5    Sensation:  Light touch Slight RUE numbness.   Pin prick    Temperature    Vibration   Proprioception    Coordination/Complex Motor:  - Finger to Nose with some slowing in RUE but no ataxia. - Heel to shin intact BL - Rapid alternating movement are slowed on the right - Gait: deferred.  Labs/Imaging/Neurodiagnostic studies   CBC:  Recent Labs  Lab Jun 09, 2023 1716  WBC 8.9  NEUTROABS 8.1*  HGB 15.7  HCT 46.3  MCV 88.5  PLT 255   Basic Metabolic Panel:  Lab Results  Component Value Date   NA 136 05/28/2023   K 3.4 (L) 05/28/2023   CO2 24 05/28/2023   GLUCOSE 102 (H) 05/28/2023   BUN 15 05/28/2023   CREATININE  0.97 05/28/2023   CALCIUM  8.4 (L) 05/28/2023   GFRNONAA >60 05/28/2023   GFRAA >60 11/01/2016   Lipid Panel:  Lab Results  Component Value Date   LDLCALC 72 05/28/2023   HgbA1c:  Lab Results  Component Value Date   HGBA1C 5.2 05/27/2023   Urine Drug Screen:     Component Value Date/Time   LABOPIA NONE DETECTED 04/20/2022 1350   LABOPIA NONE DETECTED 02/18/2014 1224   COCAINSCRNUR POSITIVE (A) 04/20/2022 1350   LABBENZ POSITIVE (A) 04/20/2022 1350   LABBENZ POSITIVE (A) 02/18/2014 1224   AMPHETMU NONE DETECTED 04/20/2022 1350   AMPHETMU NONE DETECTED 02/18/2014 1224   THCU NONE DETECTED 04/20/2022 1350   THCU NONE DETECTED 02/18/2014 1224   LABBARB NONE DETECTED 04/20/2022 1350   LABBARB NONE  DETECTED 02/18/2014 1224    Alcohol Level     Component Value Date/Time   ETH <10 05/18/2023 1050   INR  Lab Results  Component Value Date   INR 1.0 05/28/2023   APTT  Lab Results  Component Value Date   APTT 33 05/28/2023   AED levels: No results found for: "PHENYTOIN", "ZONISAMIDE", "LAMOTRIGINE", "LEVETIRACETA"  CT Head without contrast(Personally reviewed): Stable area of decreased attenuation in the lateral aspect of the left thalamus suspicious for lacunar infarct. No significant interval change is noted.  CT angio Head and Neck with contrast(Personally reviewed): 1. No emergent large vessel occlusion. 2. Approximately 80% stenosis of the right ICA origin. 3. Aortic Atherosclerosis (ICD10-I70.0) and Emphysema (ICD10-J43.9).  MRI Brain(Personally reviewed): 1. 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 2. Moderate chronic microvascular ischemic disease and remote left thalamocapsular infarct.  ASSESSMENT   Alvin Huebert. is a 63 y.o. male with hx of polysubstance use, HTN, COPD, GERD, GI bleed who presents with RUE and RLE numbness with some weakness. Symptoms started on 05/15/23. He is outside the window for [facility].  He is not a candidate for thrombectomy due to no LVO.  I suspect that the etiology for his stroke is likely small vessel stroke secondary to polysubstance use.  I would not expect the right ICA stenosis to cause a left basal ganglia infarct.  I counseled him extensively on the importance of reducing cocaine, marijuana use and smoking cigarettes to reduce risk of stroke in the future.  At this time, he wants to continue to use cocaine.  He understands the associated risks.  RECOMMENDATIONS  - Frequent Neuro checks per stroke unit protocol - Recommend obtaining TTE  - LDL elevated to 72, will increase atorvastatin  to 80. - Hemoglobin A1c is not suggestive of diabetes. - Antithrombotic -aspirin  81 mg daily along with Plavix  75 mg daily for  21 days, followed by aspirin  81 mg daily alone. - Recommend DVT ppx - SBP goal -aim for gradual normotension. - Recommend Telemetry monitoring for arrythmia - Recommend bedside swallow screen prior to PO intake. - Stroke education booklet - Recommend PT/OT/SLP consult - Recommend Urine Tox screen.  ______________________________________________________________________  Plan discussed with patient in detail and I also discussed with Dr. Jeane Miguel with the hospitalist team over secure chat.  Signed, Tashawn Greff, MD [facility]

## 2023-05-28 NOTE — Progress Notes (Signed)
 OT Cancellation Note  Patient Details Name: Alvin Gonzalez. MRN: [id number] DOB: Dec 22, 1960   Cancelled Treatment:    Reason Eval/Treat Not Completed: OT screened, no needs identified, will sign off. On arrival pt in bed, reports no issues with ADLs at this time. B grip equal 5/5. When asked about tingling/numbness pt states "I dont know." Per PT discussion pt at baseline for functional mobility. No skilled acute OT needs identified, will sign off.   Gordan Latina, M.S. OTR/L  05/28/23, 10:09 AM  ascom [phone]

## 2023-05-28 NOTE — Plan of Care (Signed)
  Problem: Education: Goal: Knowledge of disease or condition will improve Outcome: Progressing Goal: Knowledge of secondary prevention will improve (MUST DOCUMENT ALL) Outcome: Progressing Goal: Knowledge of patient specific risk factors will improve

## 2023-05-28 NOTE — Progress Notes (Signed)
  Progress Note   Patient: Alvin Gonzalez. WJX:[id number] DOB: 1960/11/07 DOA: 05/27/2023     0 DOS: the patient was seen and examined on 05/28/2023   Brief hospital course: Alvin Gonzalez. is a 63 y.o. male with medical history significant of polysubstance abuse (tobacco, cocaine, marijuana), hypertension, COPD, GERD, GI bleeding, depression with anxiety, BPH, PTSD, adrenal insufficiency, chronic pain, medication noncompliance, who presents with numbness, tingling in right arm and right foot, nausea, vomiting, diarrhea and abdominal pain.  Similar symptoms of numbness and tingling was present on 05/18/2023, at that time, CT of the head already showed a left thalamic lacunar infarct. Arrived in the hospital, MRI of the brain was performed, from the left basilar ganglia stroke.  CT angiogram showed right-sided ICA stenosis of 80%.  Neurologic consult obtained.  Vascular surgery consult obtained. Patient is treated with Plavix  and atorvastatin . Diarrhea has resolved on second day of hospitalization.   Principal Problem:   Stroke [facility]) Active Problems:   Nausea vomiting and diarrhea   HTN (hypertension)   COPD (chronic obstructive pulmonary disease) (HCC)   Adrenal insufficiency (HCC)   Depression with anxiety   Tobacco abuse   Polysubstance abuse (HCC)   Stenosis of right carotid artery   Assessment and Plan: Acute ischemic stroke of the left basal ganglia. Right internal carotid artery stenosis. LDL 72, slightly above goal.  Echocardiogram still pending. Neurology consult is also pending. Will continue Plavix  and atorvastatin . Patient also being seen by vascular surgery, consider intervention in a later date.  Hypokalemia. Acute gastroenteritis. Symptom has resolved, potassium slightly low.  Will replete.  Tobacco abuse. Advised to quit  COPD Appears stable.  Chronic back pain. Patient will be referred to pain clinic.  Polysubstance abuse.  History of cocaine  abuse. May be the cause of his stroke.     Subjective:  Patient doing better today, no weakness.  Physical Exam: Vitals:   05/28/23 0009 05/28/23 0451 05/28/23 0834 05/28/23 1140  BP: (!) 164/97 132/76 (!) 137/91 119/68  Pulse: 83 97 90 92  Resp: 18 18 18 18   Temp: 98.3 F (36.8 C) 98 F (36.7 C) 97.9 F (36.6 C) 97.8 F (36.6 C)  TempSrc: Oral Oral Oral   SpO2: 98% 95% 98% 97%  Weight:      Height:       General exam: Appears calm and comfortable  Respiratory system: Clear to auscultation. Respiratory effort normal. Cardiovascular system: S1 & S2 heard, RRR. No JVD, murmurs, rubs, gallops or clicks. No pedal edema. Gastrointestinal system: Abdomen is nondistended, soft and nontender. No organomegaly or masses felt. Normal bowel sounds heard. Central nervous system: Alert and oriented. No focal neurological deficits. Extremities: Symmetric 5 x 5 power. Skin: No rashes, lesions or ulcers Psychiatry: Judgement and insight appear normal. Mood & affect appropriate.    Data Reviewed:  MRI results, CT scan results and lab results.  Family Communication: None  Disposition: Status is: Observation      Time spent: 35 minutes  Author: Donaciano Frizzle, MD 05/28/2023 1:45 PM  For on call review [email or web address].

## 2023-05-28 NOTE — Discharge Instructions (Addendum)
 Your nurse navigator, Chennel Olivos, can be reached at [phone]   [employer] [place] Coord[employer] [address] [employer] [place]- Case Manager [phone]  Food Resources: Served HOT Meals  [facility] [address] [place] Thursday 11am-1pm  [employer] [address] [place] Monday, Wednesday & Friday 1pm-3:30pm

## 2023-05-28 NOTE — Consult Note (Signed)
 Hospital Consult    Reason for Consult:  Right Carotid Stenosis Requesting Physician:  Dr. Donaciano Frizzle MD  MRN #:  [id number]  History of Present Illness: This is a 63 y.o. male with medical history significant of polysubstance abuse (tobacco, cocaine, marijuana), hypertension, COPD, GERD, GI bleeding, depression with anxiety, BPH, PTSD, adrenal insufficiency, chronic pain, medication noncompliance, who presents with numbness, tingling in right arm and right foot, nausea, vomiting, diarrhea and abdominal pain.   Patient was seen in the ED 05/18/23 due to numbness and tingling in right arm and right foot.  Patient had CT of the head which showed left thalamocapsular lacunar infarct not clearly seen on recent prior, suspecting acute infarct. Pt left hospital AMA.  He states that his symptoms has been persistent.  He still has numbness and tingling mainly in right hand fingers and right foot toes.  No weakness in extremities.  No facial droop or slurred speech. He reports nausea vomiting, diarrhea and abdominal pain started this morning.  He has had multiple episodes of nonbilious nonbloody vomiting and about 5 times of watery diarrhea today.  He also has generalized diffuse crampy abdominal pain.  No fever or chills.  No symptoms of UTI.  Upon workup for his symptoms patient underwent CTA scan of the head and neck.  He was noted on the scan that he has an 80% stenosis of his right carotid.  Vascular surgery was consulted to evaluate.  Past Medical History:  Diagnosis Date   Adrenal insufficiency (HCC)    AKI (acute kidney injury) (HCC)    Anxiety    Cancer (HCC)    Chronic back pain    Chronic pain    Chronic prescription opiate use    Depression    Depression    GERD (gastroesophageal reflux disease)    Hemorrhoids    Hypertension    Hypokalemia    Intermittent explosive disorder    Marijuana use    Nausea vomiting and diarrhea 05/27/2023   PTSD (post-traumatic stress disorder)     Pulmonary nodule    Risk for falls    Suicide attempt (HCC)    prior to 02/18/14 admission    Tobacco abuse     Past Surgical History:  Procedure Laterality Date   CHEST TUBE INSERTION     COLONOSCOPY WITH PROPOFOL  N/A 05/14/2021   Procedure: COLONOSCOPY WITH PROPOFOL ;  Surgeon: Luke Salaam, MD;  Location: [facility] ENDOSCOPY;  Service: Gastroenterology;  Laterality: N/A;   EVALUATION UNDER ANESTHESIA WITH HEMORRHOIDECTOMY N/A 09/10/2021   Procedure: EXAM UNDER ANESTHESIA WITH HEMORRHOIDECTOMY of 2+ columns;  Surgeon: Emmalene Hare, MD;  Location: [facility] ORS;  Service: General;  Laterality: N/A;  Provider requesting 1.5 hours / 90 minutes for procedure.   LYMPH NODE BIOPSY      Allergies  Allergen Reactions   Carbamazepine Rash and Hives   Ibuprofen Nausea And Vomiting and Rash    GI Bleeding    Risperidone  Swelling    Tongue swelling Tongue swelling Tongue swelling Tongue swelling? Pt self report   Naproxen  Other (See Comments)    stomach bleeding stomach bleeding stomach bleeding    Prior to Admission medications   Medication Sig Start Date End Date Taking? Authorizing Provider  acetaminophen  (TYLENOL ) 500 MG tablet Take 1,000 mg by mouth every 6 (six) hours as needed. Patient not taking: Reported on 05/27/2023    [provider]  albuterol  (VENTOLIN  HFA) 108 (90 Base) MCG/ACT inhaler Inhale 1-2 puffs into the lungs every 6 (six)  hours as needed for wheezing or shortness of breath. Patient not taking: Reported on 05/27/2023 08/27/21   Iloabachie, Chioma E, NP  albuterol  (VENTOLIN  HFA) 108 (90 Base) MCG/ACT inhaler Inhale into the lungs. Patient not taking: Reported on 04/19/2022 08/27/21   [provider]  albuterol  (VENTOLIN  HFA) 108 (90 Base) MCG/ACT inhaler Inhale 2 inhalations into the lungs every 6 (six) hours as needed Patient not taking: Reported on 05/27/2023 11/11/21     amitriptyline  (ELAVIL ) 25 MG tablet Take 1 tablet (25 mg total) by mouth at  bedtime Patient not taking: Reported on 05/27/2023 11/11/21     amLODipine  (NORVASC ) 10 MG tablet Take 1 tablet (10 mg total) by mouth daily. Patient not taking: Reported on 05/27/2023 11/11/21     cyclobenzaprine  (FLEXERIL ) 5 MG tablet Take 1 tablet (5 mg total) by mouth 3 (three) times daily as needed. Patient not taking: Reported on 05/27/2023 12/22/22   Menshew, Raye Cai, PA-C  gabapentin  (NEURONTIN ) 400 MG capsule Take 1 capsule (400 mg total) by mouth 3 (three) times daily. Patient not taking: Reported on 04/19/2022 08/27/21   Iloabachie, Chioma E, NP  hydrochlorothiazide  (HYDRODIURIL ) 25 MG tablet Take 1 tablet (25 mg total) by mouth once daily Patient not taking: Reported on 05/27/2023 12/12/21     hydrocortisone  (ANUSOL -HC) 2.5 % rectal cream Place 1 Application rectally 2 (two) times daily. Patient not taking: Reported on 05/27/2023 09/24/21   Emmalene Hare, MD  lisinopril  (ZESTRIL ) 30 MG tablet Take 1 tablet (30 mg total) by mouth daily. Patient not taking: Reported on 04/19/2022 12/09/21     lisinopril  (ZESTRIL ) 40 MG tablet Take 1 tablet (40 mg total) by mouth daily. Patient not taking: Reported on 05/27/2023 11/11/21     lisinopril  (ZESTRIL ) 40 MG tablet Take 1 tablet (40 mg total) by mouth daily. Patient not taking: Reported on 05/27/2023 12/12/21     pantoprazole  (PROTONIX ) 40 MG tablet Take 1 tablet (40 mg total) by mouth once daily Patient not taking: Reported on 05/27/2023 11/11/21     tamsulosin  (FLOMAX ) 0.4 MG CAPS capsule Take 2 capsules (0.8 mg total) by mouth daily. Patient not taking: Reported on 05/27/2023 10/08/21   Geraline Knapp, MD  DULoxetine  (CYMBALTA ) 30 MG capsule Take 1 capsule (30 mg total) by mouth daily for 7 days, THEN 2 capsules (60 mg total) daily 10/23/21 01/12/22      Social History   Socioeconomic History   Marital status: Legally Separated    Spouse name: Not on file   Number of children: Not on file   Years of education: Not on file   Highest education  level: Not on file  Occupational History   Not on file  Tobacco Use   Smoking status: Every Day    Current packs/day: 1.00    Average packs/day: 1 pack/day for 45.0 years (45.0 ttl pk-yrs)    Types: Cigarettes   Smokeless tobacco: Former    Quit date: 01/30/2015  Vaping Use   Vaping status: Former   Quit date: 01/23/2017  Substance and Sexual Activity   Alcohol use: Yes    Comment: occ   Drug use: Yes    Types: Marijuana, Cocaine    Comment: special occasions , last use 05/19/21. uses cocaine, last use 04/17/22   Sexual activity: Yes    Birth control/protection: None  Other Topics Concern   Not on file  Social History Narrative   Not on file   Social Drivers of Corporate investment banker  Strain: Patient Declined (01/20/2023)   Received from [facility]   Overall Financial Resource Strain (CARDIA)    Difficulty of Paying Living Expenses: Patient declined  Food Insecurity: Patient Declined (05/28/2023)   Hunger Vital Sign    Worried About Running Out of Food in the Last Year: Patient declined    Ran Out of Food in the Last Year: Patient declined  Transportation Needs: Patient Declined (05/28/2023)   PRAPARE - Administrator, Civil Service (Medical): Patient declined    Lack of Transportation (Non-Medical): Patient declined  Physical Activity: Not on file  Stress: Not on file  Social Connections: Not on file  Intimate Partner Violence: Patient Declined (05/28/2023)   Humiliation, Afraid, Rape, and Kick questionnaire    Fear of Current or Ex-Partner: Patient declined    Emotionally Abused: Patient declined    Physically Abused: Patient declined    Sexually Abused: Patient declined     Family History  Problem Relation Age of Onset   Diabetes Mother    Hypertension Mother    Other Mother        possible ovarian cancer   Heart disease Father    Hypertension Father    Cirrhosis Father    Liver cancer Father    Lung cancer Sister    Heart  attack Maternal Grandmother    Suicidality Maternal Grandfather    Other Paternal Grandmother        unknown medical history   Suicidality Paternal Grandfather    Stomach cancer Cousin     ROS: Otherwise negative unless mentioned in HPI  Physical Examination  Vitals:   05/28/23 0451 05/28/23 0834  BP: 132/76 (!) 137/91  Pulse: 97 90  Resp: 18 18  Temp: 98 F (36.7 C) 97.9 F (36.6 C)  SpO2: 95% 98%   Body mass index is 23.3 kg/m.  General:  WDWN in NAD Gait: Not observed HENT: WNL, normocephalic Pulmonary: normal non-labored breathing, without Rales, rhonchi,  wheezing Cardiac: regular, without  Murmurs, rubs or gallops; without carotid bruits Abdomen: Positive bowel sounds throughout, soft, NT/ND, no masses Skin: without rashes Vascular Exam/Pulses: Palpable pulses throughout, all extremities are warm to touch. Extremities: without ischemic changes, without Gangrene , without cellulitis; without open wounds;  Musculoskeletal: no muscle wasting or atrophy  Neurologic: A&O X 3;  No focal weakness or paresthesias are detected; speech is fluent/normal Psychiatric:  The pt has Normal affect. Lymph:  Unremarkable  CBC    Component Value Date/Time   WBC 8.9 05/27/2023 1716   RBC 5.23 05/27/2023 1716   HGB 15.7 05/27/2023 1716   HGB 14.1 02/18/2014 0914   HCT 46.3 05/27/2023 1716   HCT 41.9 02/18/2014 0914   PLT 255 05/27/2023 1716   PLT 272 02/18/2014 0914   MCV 88.5 05/27/2023 1716   MCV 86 02/18/2014 0914   MCH 30.0 05/27/2023 1716   MCHC 33.9 05/27/2023 1716   RDW 13.0 05/27/2023 1716   RDW 13.8 02/18/2014 0914   LYMPHSABS 0.4 (L) 05/27/2023 1716   LYMPHSABS 0.7 (L) 02/18/2014 0914   MONOABS 0.3 05/27/2023 1716   MONOABS 0.7 02/18/2014 0914   EOSABS 0.1 05/27/2023 1716   EOSABS 0.0 02/18/2014 0914   BASOSABS 0.0 05/27/2023 1716   BASOSABS 0.0 02/18/2014 0914    BMET    Component Value Date/Time   NA 142 05/27/2023 1716   NA 144 01/23/2021 1918   NA  144 02/18/2014 0914   K 3.5 05/27/2023 1716  K 3.0 (L) 02/18/2014 0914   CL 107 05/27/2023 1716   CL 106 02/18/2014 0914   CO2 27 05/27/2023 1716   CO2 28 02/18/2014 0914   GLUCOSE 105 (H) 05/27/2023 1716   GLUCOSE 137 (H) 02/18/2014 0914   BUN 16 05/27/2023 1716   BUN 13 01/23/2021 1918   BUN 15 02/18/2014 0914   CREATININE 1.13 05/27/2023 1716   CREATININE 2.18 (H) 02/18/2014 0914   CALCIUM  8.7 (L) 05/27/2023 1716   CALCIUM  8.9 02/18/2014 0914   GFRNONAA >60 05/27/2023 1716   GFRNONAA 34 (L) 02/18/2014 0914   GFRNONAA >60 07/16/2011 2028   GFRAA >60 11/01/2016 1421   GFRAA 41 (L) 02/18/2014 0914   GFRAA >60 07/16/2011 2028    COAGS: Lab Results  Component Value Date   INR 1.0 05/28/2023   INR 0.9 05/18/2023   INR 0.91 01/30/2015     Non-Invasive Vascular Imaging:     EXAM:05/28/23 CT ANGIOGRAPHY HEAD AND NECK WITH AND WITHOUT CONTRAST   TECHNIQUE: Multidetector CT imaging of the head and neck was performed using the standard protocol during bolus administration of intravenous contrast. Multiplanar CT image reconstructions and MIPs were obtained to evaluate the vascular anatomy. Carotid stenosis measurements (when applicable) are obtained utilizing NASCET criteria, using the distal internal carotid diameter as the denominator.   RADIATION DOSE REDUCTION: This exam was performed according to the departmental dose-optimization program which includes automated exposure control, adjustment of the mA and/or kV according to patient size and/or use of iterative reconstruction technique.   CONTRAST:  75mL OMNIPAQUE  IOHEXOL  350 MG/ML SOLN   COMPARISON:  CT head April 8, 25   FINDINGS: CTA NECK FINDINGS   Aortic arch: Great vessel origins are patent. 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 Aortic atherosclerosis.   Right carotid system: Atherosclerosis at the carotid bifurcation with approximately 80% stenosis of the ICA  origin.   Left carotid system: No significant (greater than 50%) stenosis.   Vertebral arteries: Codominant. No evidence of dissection, stenosis (50% or greater), or occlusion.   Skeleton: No evidence of acute abnormality on limited assessment.   Other neck:  No evidence of acute abnormality on limited assessment.   Upper chest: Emphysema.   Review of the MIP images confirms the above findings   CTA HEAD FINDINGS   Anterior circulation: Bilateral intracranial ICAs, MCAs, and ACAs are patent without proximal hemodynamically significant stenosis.   Posterior circulation: Bilateral intradural vertebral arteries, basilar artery and bilateral posterior cerebral arteries are patent without proximal hemodynamically significant stenosis.   Venous sinuses: As permitted by contrast timing, patent.   Review of the MIP images confirms the above findings   IMPRESSION: 1. No emergent large vessel occlusion. 2. Approximately 80% stenosis of the right ICA origin. 3. Aortic Atherosclerosis (ICD10-I70.0) and Emphysema (ICD10-J43.9).  EXAM:05/28/23 MRI HEAD WITHOUT CONTRAST   TECHNIQUE: Multiplanar, multiecho pulse sequences of the brain and surrounding structures were obtained without intravenous contrast.   COMPARISON:  CT head from today.   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 Mild associated edema without mass effect. Remote left thalamocapsular infarct. Moderate patchy T2/FLAIR hyperintensities, nonspecific but compatible with chronic microvascular ischemic disease. No evidence of acute hemorrhage, mass lesion, or midline shift.   Vascular: Major arterial flow voids are maintained at the skull base.   Skull and upper cervical spine: Normal marrow signal.   Sinuses/Orbits: Clear sinuses.  No acute orbital findings.   IMPRESSION: 1. 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 2. Moderate chronic microvascular ischemic disease and remote  left thalamocapsular infarct.  Statin:  No. Beta Blocker:  No. Aspirin :  No. ACEI:  Yes.   ARB:  No. CCB use:  Yes Other antiplatelets/anticoagulants:  No.    ASSESSMENT/PLAN: This is a 63 y.o. male who presents to [facility] emergency department with numbness, tingling in right arm and right foot, nausea, vomiting, diarrhea and abdominal pain.  Upon workup patient underwent CTA of the head and neck.  As well as an MRI of the brain.  CTA showed no emergent large vessel occlusion but did show 80% stenosis of the right carotid artery.  MRI did show acute perforator infarct in the left basal ganglia with moderate chronic microvascular ischemia disease and remote left thalamocapsular infarct.  After reviewing the CTA and MRI with Dr. Mikki Alexander MD today it appears that his right carotid stenosis is most likely not contributory to his symptoms he is having today.  However he does seem to have an 80% or greater stenosis to the right carotid which will require an intervention or surgery.  Patient has already been placed on Plavix  75 mg daily and Lipitor 40 mg daily.  We recommend he continue to take that outpatient.  We will see the patient back in clinic 2 weeks after discharge for evaluation of procedure or surgery at that time.  I counseled the patient today on the importance of taking Plavix  on a daily basis once he is discharged from the hospital prior to seeing us  as a pertains to him progressively having more of a stroke.  He verbalizes understanding.  I will set up outpatient follow-up for him to be seen in 2 to 3 weeks.   -I discussed the case in detail with Dr. Mikki Alexander MD and he agrees with the plan.   Annamaria Barrette [facility] 05/28/2023 9:16 AM

## 2023-05-28 NOTE — Hospital Course (Addendum)
 Alvin Gonzalez. is a 63 y.o. male with medical history significant of polysubstance abuse (tobacco, cocaine, marijuana), hypertension, COPD, GERD, GI bleeding, depression with anxiety, BPH, PTSD, adrenal insufficiency, chronic pain, medication noncompliance, who presents with numbness, tingling in right arm and right foot, nausea, vomiting, diarrhea and abdominal pain.  Similar symptoms of numbness and tingling was present on 05/18/2023, at that time, CT of the head already showed a left thalamic lacunar infarct. Arrived in the hospital, MRI of the brain was performed, from the left basilar ganglia stroke.  CT angiogram showed right-sided ICA stenosis of 80%.  Neurologic consult obtained.  Vascular surgery consult obtained. Patient is treated with Plavix  and atorvastatin . Diarrhea has resolved on second day of hospitalization.

## 2023-05-28 NOTE — Progress Notes (Signed)
 PT Cancellation Note  Patient Details Name: Alvin Gonzalez. MRN: [id number] DOB: [birth year]   Cancelled Treatment:    Reason Eval/Treat Not Completed: PT screened, no needs identified, will sign off. Orders received and chart reviewed. Upon entry pt seated in recliner. Denies R sided weakness but has persistent N/T in fingers/toes on R side. Pt independent with STS and ambulation (180'). Mildly unsteady but pt reports this is his baseline. LE strength appropriate for tasks performed. Pt declining stair attempts/screening while OOB returning to recliner eating breakfast. Pt with no acute needs. Deferred care and education to medical team. PT to sign off.    Marc Senior. Fairly IV, PT, DPT Physical Therapist- [place]  [facility]  05/28/2023, 9:16 AM

## 2023-05-28 NOTE — Progress Notes (Signed)
 Echocardiogram 2D Echocardiogram has been performed.  Alvin Gonzalez 05/28/2023, 4:37 PM

## 2023-05-29 DIAGNOSIS — R112 Nausea with vomiting, unspecified: Secondary | ICD-10-CM | POA: Diagnosis not present

## 2023-05-29 DIAGNOSIS — F1910 Other psychoactive substance abuse, uncomplicated: Secondary | ICD-10-CM | POA: Diagnosis not present

## 2023-05-29 DIAGNOSIS — I6381 Other cerebral infarction due to occlusion or stenosis of small artery: Principal | ICD-10-CM

## 2023-05-29 DIAGNOSIS — I6521 Occlusion and stenosis of right carotid artery: Secondary | ICD-10-CM | POA: Diagnosis not present

## 2023-05-29 LAB — BASIC METABOLIC PANEL WITH GFR
Anion gap: 9 (ref 5–15)
BUN: 13 mg/dL (ref 8–23)
CO2: 25 mmol/L (ref 22–32)
Calcium: 8.8 mg/dL — ABNORMAL LOW (ref 8.9–10.3)
Chloride: 103 mmol/L (ref 98–111)
Creatinine, Ser: 0.83 mg/dL (ref 0.61–1.24)
GFR, Estimated: >60 mL/min (ref >60)
Glucose, Bld: 106 mg/dL — ABNORMAL HIGH (ref 70–99)
Potassium: 4.2 mmol/L (ref 3.5–5.1)
Sodium: 137 mmol/L (ref 135–145)

## 2023-05-29 LAB — MAGNESIUM: Magnesium: 2.1 mg/dL (ref 1.7–2.4)

## 2023-05-29 MED ORDER — ASPIRIN 81 MG PO TBEC: 81.0000 mg | DELAYED_RELEASE_TABLET | Freq: Every day | ORAL | 2 refills | Status: AC

## 2023-05-29 MED ORDER — ASPIRIN 81 MG PO TBEC: 81.0000 mg | DELAYED_RELEASE_TABLET | Freq: Every day | ORAL | 0 refills | Status: DC

## 2023-05-29 MED ORDER — AMLODIPINE BESYLATE 10 MG PO TABS: 10.0000 mg | ORAL_TABLET | Freq: Every day | ORAL | 0 refills | Status: AC

## 2023-05-29 MED ORDER — ATORVASTATIN CALCIUM 80 MG PO TABS: 80.0000 mg | ORAL_TABLET | Freq: Every day | ORAL | 0 refills | Status: AC

## 2023-05-29 MED ORDER — SENNOSIDES-DOCUSATE SODIUM 8.6-50 MG PO TABS
2.0000 | ORAL_TABLET | Freq: Two times a day (BID) | ORAL | 0 refills | Status: DC | PRN
Start: 2023-05-29 — End: 2023-08-23

## 2023-05-29 MED ORDER — ASPIRIN 81 MG PO TBEC
81.0000 mg | DELAYED_RELEASE_TABLET | Freq: Every day | ORAL | Status: DC
  Filled 2023-05-29: qty 1

## 2023-05-29 MED ORDER — TAMSULOSIN HCL 0.4 MG PO CAPS: 0.8000 mg | ORAL_CAPSULE | Freq: Every day | ORAL | 0 refills | Status: DC

## 2023-05-29 MED ORDER — CLOPIDOGREL BISULFATE 75 MG PO TABS: 75.0000 mg | ORAL_TABLET | Freq: Every day | ORAL | 0 refills | Status: AC

## 2023-05-29 MED ORDER — NICOTINE 21 MG/24HR TD PT24: 21.0000 mg | MEDICATED_PATCH | Freq: Every day | TRANSDERMAL | 0 refills | Status: DC

## 2023-05-29 NOTE — Progress Notes (Signed)
 Room 102 staff approached this writer at the nurse's station indicating the pt was not in his room;  the telemetry heart monitor was beeping and the leads were on the bed; Rainelle Bur RN and this Clinical research associate checked the trash cans in the room and in the pt's bathroom for the pt's IV cannula, nothing in the trash cans; the unit clerk called the pt on his cell phone [phone] to see where he was at, the pt told her the doctor said he was going home today so he left, at present there are no orders in [facility] to discharge the pt home today; this writer took the phone to advise the pt that as of this moment there are no discharge orders in place and he has left Against Medical Advice; the pt hung the phone up; advised the [facility], Oren Bing RN of said activity; contacted [employer] for nonemergency, advised them of said activity and that the pt should have an IV in his right upper arm 18ga and that his H&P indicates a history of drug use; gave [employer] Department this writer's name and number for further contact

## 2023-05-29 NOTE — Progress Notes (Signed)
 Went into patient's room at about 1210 to round on patient, patient was no where to be found. Searched around unit and went out to entrance, but patient was not seen anywhere. Charge RN, Jerrold Morgan made aware. See nurse's note for details. Dr. Jeane Miguel also made aware. MD advised to call patient and make him aware to pick up medications from pharmacy. This Clinical research associate called patient's number on chart and his mother, Alvin Gonzalez, answered the phone stating that patient did not have a phone and it was her number I had called. Alvin Gonzalez reported that she had just dropped patient off at his home and that she was going to let him know. Alvin Gonzalez asked how much the medications cost and I told her that I was not sure. Alvin Gonzalez responded that patient did not have any money to get the medications. This Clinical research associate encouraged mother to notify patient anyhow. Mother verbalized understanding and promised to make patient aware.

## 2023-05-29 NOTE — Discharge Summary (Signed)
**Note Alvin-Identified via Obfuscation**  Physician Discharge Summary   Patient: Alvin Gonzalez. MRN: [id number] DOB: 1960-04-20  Admit date:     05/27/2023  Discharge date: 05/29/23  Discharge Physician: Donaciano Frizzle   PCP: Eartha Gold, MD   Recommendations at discharge:   Follow-up with PCP in 1 week. Follow-up with neurology in 2 months. Follow-up with vascular surgery in 1 months. Referred to pain clinic.  Discharge Diagnoses: Principal Problem:   Stroke [facility]) Active Problems:   Nausea vomiting and diarrhea   HTN (hypertension)   COPD (chronic obstructive pulmonary disease) (HCC)   Adrenal insufficiency (HCC)   Depression with anxiety   Tobacco abuse   Polysubstance abuse (HCC)   Stenosis of right carotid artery  Resolved Problems:   * No resolved hospital problems. *  Hospital Course: Alvin Gonzalez. is a 63 y.o. male with medical history significant of polysubstance abuse (tobacco, cocaine, marijuana), hypertension, COPD, GERD, GI bleeding, depression with anxiety, BPH, PTSD, adrenal insufficiency, chronic pain, medication noncompliance, who presents with numbness, tingling in right arm and right foot, nausea, vomiting, diarrhea and abdominal pain.  Similar symptoms of numbness and tingling was present on 05/18/2023, at that time, CT of the head already showed a left thalamic lacunar infarct. Arrived in the hospital, MRI of the brain was performed, from the left basilar ganglia stroke.  CT angiogram showed right-sided ICA stenosis of 80%.  Neurologic consult obtained.  Vascular surgery consult obtained. Patient is treated with Plavix  and atorvastatin . Diarrhea has resolved on second day of hospitalization.  Assessment and Plan: Acute ischemic stroke of the left basal ganglia. Right internal carotid artery stenosis. LDL 72, slightly above goal.  Echocardiogram showed ejection fraction 55 to 60%, grade 1 diastolic dysfunction.  Doppler did not show any evidence of a PFO. Neurology consult is  obtained, recommend treated with aspirin  long-term, Plavix  for 21 days and high-dose of atorvastatin . Patient also being seen by vascular surgery, consider intervention in a later date.   Hypokalemia. Acute gastroenteritis. Symptom has resolved, potassium normalized.   Tobacco abuse. Advised to quit   COPD Appears stable.   Chronic back pain. Patient will be referred to pain clinic.   Polysubstance abuse.  Cocaine abuse. May be the cause of his stroke. Advised to quit.          Consultants: Neurology Procedures performed: None  Disposition: Home Diet recommendation:  Cardiac diet DISCHARGE MEDICATION: Allergies as of 05/29/2023       Reactions   Carbamazepine Rash, Hives   Ibuprofen Nausea And Vomiting, Rash   GI Bleeding   Risperidone  Swelling   Tongue swelling Tongue swelling Tongue swelling Tongue swelling? Pt self report   Naproxen  Other (See Comments)   stomach bleeding stomach bleeding stomach bleeding        Medication List     STOP taking these medications    acetaminophen  500 MG tablet Commonly known as: TYLENOL    amitriptyline  25 MG tablet Commonly known as: ELAVIL    cyclobenzaprine  5 MG tablet Commonly known as: FLEXERIL    gabapentin  400 MG capsule Commonly known as: NEURONTIN    hydrochlorothiazide  25 MG tablet Commonly known as: HYDRODIURIL    lisinopril  30 MG tablet Commonly known as: ZESTRIL    lisinopril  40 MG tablet Commonly known as: ZESTRIL    pantoprazole  40 MG tablet Commonly known as: PROTONIX    Procto-Med HC  2.5 % rectal cream Generic drug: hydrocortisone        TAKE these medications    amLODipine  10 MG tablet Commonly known as: NORVASC   Take 1 tablet (10 mg total) by mouth daily.   aspirin  EC 81 MG tablet Take 1 tablet (81 mg total) by mouth daily. Swallow whole.   aspirin  EC 81 MG tablet Take 1 tablet (81 mg total) by mouth daily. Swallow whole.   atorvastatin  80 MG tablet Commonly known as:  LIPITOR Take 1 tablet (80 mg total) by mouth daily. Start taking on: May 30, 2023   clopidogrel  75 MG tablet Commonly known as: PLAVIX  Take 1 tablet (75 mg total) by mouth daily for 21 days. Start taking on: May 30, 2023   nicotine  21 mg/24hr patch Commonly known as: NICODERM CQ  - dosed in mg/24 hours Place 1 patch (21 mg total) onto the skin daily. Start taking on: May 30, 2023   tamsulosin  0.4 MG Caps capsule Commonly known as: FLOMAX  Take 2 capsules (0.8 mg total) by mouth daily.   Ventolin  HFA 108 (90 Base) MCG/ACT inhaler Generic drug: albuterol  Inhale 2 inhalations into the lungs every 6 (six) hours as needed What changed: Another medication with the same name was removed. Continue taking this medication, and follow the directions you see here.        Follow-up Information     Eartha Gold, MD Follow up in 1 week(s).   Specialty: Infectious Diseases Why: Hospital follow up Contact information: [address] [place] [address] [phone]         [facility] Follow up in 2 month(s).   Contact information: [address] [place]  [address] [phone]        [employer], MD Follow up in 1 month(s).   Specialties: Vascular Surgery, Radiology, Interventional Cardiology Contact information: [address] [place] [address] [phone]                Discharge Exam: Alvin Gonzalez Weights   05/27/23 1714  Weight: 63.5 kg   General exam: Appears calm and comfortable  Respiratory system: Clear to auscultation. Respiratory effort normal. Cardiovascular system: S1 & S2 heard, RRR. No JVD, murmurs, rubs, gallops or clicks. No pedal edema. Gastrointestinal system: Abdomen is nondistended, soft and nontender. No organomegaly or masses felt. Normal bowel sounds heard. Central nervous system: Alert and oriented. No focal neurological deficits. Extremities: Symmetric  5 x 5 power. Skin: No rashes, lesions or ulcers Psychiatry: Judgement and insight appear normal. Mood & affect appropriate.    Condition at discharge: good  The results of significant diagnostics from this hospitalization (including imaging, microbiology, ancillary and laboratory) are listed below for reference.   Imaging Studies: ECHOCARDIOGRAM COMPLETE Result Date: 05/28/2023    ECHOCARDIOGRAM REPORT   Patient Name:   Alvin Gonzalez. Date of Exam: 05/28/2023 Medical Rec #:  [id number]            Height:       65.0 in Accession #:    [id number]           Weight:       140.0 lb Date of Birth:  September 08, 1960            BSA:          1.700 m Patient Age:    63 years             BP:           119/68 mmHg Patient Gender: M  HR:           86 bpm. Exam Location:  [facility] Procedure: 2D Echo, Cardiac Doppler and Color Doppler (Both Spectral and Color            Flow Doppler were utilized during procedure). Indications:     Stroke I63.9  History:         Patient has no prior history of Echocardiogram examinations.                  Stroke and COPD, Signs/Symptoms:Hypotension; Risk                  Factors:Hypertension and Current Smoker.  Sonographer:     Terrilee Few RCS Referring Phys:  [id number] XILIN NIU Diagnosing Phys: Constancia Delton MD  Sonographer Comments: Image acquisition challenging due to patient body habitus. IMPRESSIONS  1. Left ventricular ejection fraction, by estimation, is 55 to 60%. The left ventricle has normal function. The left ventricle has no regional wall motion abnormalities. Left ventricular diastolic parameters are consistent with Grade I diastolic dysfunction (impaired relaxation).  2. Right ventricular systolic function is normal. The right ventricular size is normal.  3. The mitral valve is normal in structure. No evidence of mitral valve regurgitation.  4. The aortic valve is tricuspid. Aortic valve regurgitation is not visualized.  5. The inferior vena cava is  normal in size with greater than 50% respiratory variability, suggesting right atrial pressure of 3 mmHg. FINDINGS  Left Ventricle: Left ventricular ejection fraction, by estimation, is 55 to 60%. The left ventricle has normal function. The left ventricle has no regional wall motion abnormalities. The left ventricular internal cavity size was normal in size. There is  no left ventricular hypertrophy. Left ventricular diastolic parameters are consistent with Grade I diastolic dysfunction (impaired relaxation). Right Ventricle: The right ventricular size is normal. No increase in right ventricular wall thickness. Right ventricular systolic function is normal. Left Atrium: Left atrial size was normal in size. Right Atrium: Right atrial size was normal in size. Pericardium: There is no evidence of pericardial effusion. Mitral Valve: The mitral valve is normal in structure. No evidence of mitral valve regurgitation. Tricuspid Valve: The tricuspid valve is normal in structure. Tricuspid valve regurgitation is trivial. Aortic Valve: The aortic valve is tricuspid. Aortic valve regurgitation is not visualized. Aortic valve peak gradient measures 3.6 mmHg. Pulmonic Valve: The pulmonic valve was normal in structure. Pulmonic valve regurgitation is mild. Aorta: The aortic root is normal in size and structure. Venous: The inferior vena cava is normal in size with greater than 50% respiratory variability, suggesting right atrial pressure of 3 mmHg. IAS/Shunts: No atrial level shunt detected by color flow Doppler.   Diastology LV e' medial:    7.94 cm/s LV E/e' medial:  5.5 LV e' lateral:   9.79 cm/s LV E/e' lateral: 4.5  RIGHT VENTRICLE             IVC RV S prime:     14.90 cm/s  IVC diam: 1.40 cm TAPSE (M-mode): 2.5 cm RIGHT ATRIUM           Index RA Area:     11.40 cm RA Volume:   25.90 ml  15.24 ml/m  AORTIC VALVE AV Vmax:      95.50 cm/s AV Peak Grad: 3.6 mmHg LVOT Vmax:    58.85 cm/s LVOT Vmean:   36.500 cm/s LVOT VTI:      0.083 m MITRAL VALVE MV Area (PHT): 4.29 cm  SHUNTS MV Decel Time: 177 msec    Systemic VTI: 0.08 m MV E velocity: 44.00 cm/s MV A velocity: 68.10 cm/s MV E/A ratio:  0.65 Constancia Delton MD Electronically signed by Constancia Delton MD Signature Date/Time: 05/28/2023/5:01:21 PM    Final    MR BRAIN WO CONTRAST Result Date: 05/28/2023 CLINICAL DATA:  Insert streak EXAM: MRI HEAD WITHOUT CONTRAST TECHNIQUE: Multiplanar, multiecho pulse sequences of the brain and surrounding structures were obtained without intravenous contrast. COMPARISON:  CT head from today. 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 Mild associated edema without mass effect. Remote left thalamocapsular infarct. Moderate patchy T2/FLAIR hyperintensities, nonspecific but compatible with chronic microvascular ischemic disease. No evidence of acute hemorrhage, mass lesion, or midline shift. Vascular: Major arterial flow voids are maintained at the skull base. Skull and upper cervical spine: Normal marrow signal. Sinuses/Orbits: Clear sinuses.  No acute orbital findings. IMPRESSION: 1. 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 2. Moderate chronic microvascular ischemic disease and remote left thalamocapsular infarct. Electronically Signed   By: Stevenson Elbe M.D.   On: 05/28/2023 02:12   CT ANGIO HEAD NECK W WO CM Result Date: 05/28/2023 CLINICAL DATA:  Neuro deficit, acute, stroke suspected EXAM: CT ANGIOGRAPHY HEAD AND NECK WITH AND WITHOUT CONTRAST TECHNIQUE: Multidetector CT imaging of the head and neck was performed using the standard protocol during bolus administration of intravenous contrast. Multiplanar CT image reconstructions and MIPs were obtained to evaluate the vascular anatomy. Carotid stenosis measurements (when applicable) are obtained utilizing NASCET criteria, using the distal internal carotid diameter as the denominator. RADIATION DOSE REDUCTION: This exam was performed according to  the departmental dose-optimization program which includes automated exposure control, adjustment of the mA and/or kV according to patient size and/or use of iterative reconstruction technique. CONTRAST:  75mL OMNIPAQUE  IOHEXOL  350 MG/ML SOLN COMPARISON:  CT head April 8, 25 FINDINGS: CTA NECK FINDINGS Aortic arch: Great vessel origins are patent. 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 Aortic atherosclerosis. Right carotid system: Atherosclerosis at the carotid bifurcation with approximately 80% stenosis of the ICA origin. Left carotid system: No significant (greater than 50%) stenosis. Vertebral arteries: Codominant. No evidence of dissection, stenosis (50% or greater), or occlusion. Skeleton: No evidence of acute abnormality on limited assessment. Other neck:  No evidence of acute abnormality on limited assessment. Upper chest: Emphysema. Review of the MIP images confirms the above findings CTA HEAD FINDINGS Anterior circulation: Bilateral intracranial ICAs, MCAs, and ACAs are patent without proximal hemodynamically significant stenosis. Posterior circulation: Bilateral intradural vertebral arteries, basilar artery and bilateral posterior cerebral arteries are patent without proximal hemodynamically significant stenosis. Venous sinuses: As permitted by contrast timing, patent. Review of the MIP images confirms the above findings IMPRESSION: 1. No emergent large vessel occlusion. 2. Approximately 80% stenosis of the right ICA origin. 3. Aortic Atherosclerosis (ICD10-I70.0) and Emphysema (ICD10-J43.9). Electronically Signed   By: Stevenson Elbe M.D.   On: 05/28/2023 02:10   CT Head Wo Contrast Result Date: 05/27/2023 CLINICAL DATA:  Recent left thalamic struck EXAM: CT HEAD WITHOUT CONTRAST TECHNIQUE: Contiguous axial images were obtained from the base of the skull through the vertex without intravenous contrast. RADIATION DOSE REDUCTION: This exam was performed  according to the departmental dose-optimization program which includes automated exposure control, adjustment of the mA and/or kV according to patient size and/or use of iterative reconstruction technique. COMPARISON:  05/18/2023 FINDINGS: Brain: A vague area of decreased attenuation is identified within the lateral aspect  of the left thalamus similar to that seen on the prior exam. No significant interval changes noted. No new associated hemorrhage is seen. No acute hemorrhage or acute infarction is seen. Vascular: No hyperdense vessel or unexpected calcification. Skull: Normal. Negative for fracture or focal lesion. Sinuses/Orbits: No acute finding. Other: None. IMPRESSION: Stable area of decreased attenuation in the lateral aspect of the left thalamus suspicious for lacunar infarct. No significant interval change is noted. Electronically Signed   By: Violeta Grey M.D.   On: 05/27/2023 20:08   CT ABDOMEN PELVIS W CONTRAST Result Date: 05/27/2023 CLINICAL DATA:  Generalized abdominal pain with nausea and vomiting EXAM: CT ABDOMEN AND PELVIS WITH CONTRAST TECHNIQUE: Multidetector CT imaging of the abdomen and pelvis was performed using the standard protocol following bolus administration of intravenous contrast. RADIATION DOSE REDUCTION: This exam was performed according to the departmental dose-optimization program which includes automated exposure control, adjustment of the mA and/or kV according to patient size and/or use of iterative reconstruction technique. CONTRAST:  OMNIPAQUE  IOHEXOL  300 MG/ML  SOLN COMPARISON:  01/30/2015 FINDINGS: Lower chest: No acute abnormality. Hepatobiliary: No focal liver abnormality is seen. No gallstones, gallbladder wall thickening, or biliary dilatation. Pancreas: Unremarkable. No pancreatic ductal dilatation or surrounding inflammatory changes. Spleen: Normal in size without focal abnormality. Adrenals/Urinary Tract: Adrenal glands are within normal limits. Kidneys are  well visualized bilaterally. No renal calculi or obstructive changes are noted. The bladder is well distended. Stomach/Bowel: Colon is decompressed. No obstructive changes are noted. The appendix is within normal limits. Small bowel and stomach are unremarkable. Vascular/Lymphatic: Aortic atherosclerosis. No enlarged abdominal or pelvic lymph nodes. Reproductive: Prostate is unremarkable. Other: No abdominal wall hernia or abnormality. No abdominopelvic ascites. Musculoskeletal: No acute or significant osseous findings. IMPRESSION: No acute abnormality to correspond with the given clinical history. Electronically Signed   By: Violeta Grey M.D.   On: 05/27/2023 20:04   CT HEAD WO CONTRAST Result Date: 05/18/2023 CLINICAL DATA:  Neuro deficit with acute stroke suspected EXAM: CT HEAD WITHOUT CONTRAST TECHNIQUE: Contiguous axial images were obtained from the base of the skull through the vertex without intravenous contrast. RADIATION DOSE REDUCTION: This exam was performed according to the departmental dose-optimization program which includes automated exposure control, adjustment of the mA and/or kV according to patient size and/or use of iterative reconstruction technique. COMPARISON:  Head CT 04/19/2022 FINDINGS: Brain: Subtle low-density at the lateral left thalamus, not clearly seen on prior. There is chronic small vessel disease in the cerebral white matter which is moderately extensive. No acute hemorrhage, hydrocephalus, mass, or collection. Vascular: No hyperdense vessel or unexpected calcification. Skull: Normal. Negative for fracture or focal lesion. Sinuses/Orbits: No acute finding. IMPRESSION: Left thalamocapsular lacunar infarct not clearly seen on recent prior, suspect acute infarct. Chronic small vessel disease in the cerebral white matter. Electronically Signed   By: Ronnette Coke M.D.   On: 05/18/2023 11:45   DG Lumbar Spine 2-3 Views Result Date: 05/16/2023 CLINICAL DATA:  Low back pain.  No  recent injury. EXAM: LUMBAR SPINE - 2-3 VIEW COMPARISON:  Radiograph 09/13/2015 FINDINGS: Five non-rib-bearing lumbar vertebra. Chronic anterior wedging of T12 and L1. No evidence of acute fracture. Trace retrolisthesis of L1 on L2 and L2 on L3, unchanged from prior. Space narrowing and anterior spurring T12-L1, L1-L2, and L2-L3. Mild multilevel facet hypertrophy. No visible pars defects or focal bone abnormality. Sacroiliac joints are congruent. IMPRESSION: 1. No acute findings. 2. Mild multilevel degenerative disc disease and facet hypertrophy. 3. Chronic  anterior wedging of T12 and L1. Electronically Signed   By: Chadwick Colonel M.D.   On: 05/16/2023 18:11    Microbiology: Results for orders placed or performed during the hospital encounter of 04/19/22  Resp panel by RT-PCR (RSV, Flu A&B, Covid) Anterior Nasal Swab     Status: None   Collection Time: 04/19/22  1:02 AM   Specimen: Anterior Nasal Swab  Result Value Ref Range Status   SARS Coronavirus 2 by RT PCR NEGATIVE NEGATIVE Final    Comment: (NOTE) SARS-CoV-2 target nucleic acids are NOT DETECTED.  The SARS-CoV-2 RNA is generally detectable in upper respiratory specimens during the acute phase of infection. The lowest concentration of SARS-CoV-2 viral copies this assay can detect is 138 copies/mL. A negative result does not preclude SARS-Cov-2 infection and should not be used as the sole basis for treatment or other patient management decisions. A negative result may occur with  improper specimen collection/handling, submission of specimen other than nasopharyngeal swab, presence of viral mutation(s) within the areas targeted by this assay, and inadequate number of viral copies(<138 copies/mL). A negative result must be combined with clinical observations, patient history, and epidemiological information. The expected result is Negative.  Fact Sheet for Patients:  BloggerCourse.com  Fact Sheet for  Healthcare Providers:  SeriousBroker.it  This test is no t yet approved or cleared by the [place]  FDA and  has been authorized for detection and/or diagnosis of SARS-CoV-2 by FDA under an Emergency Use Authorization (EUA). This EUA will remain  in effect (meaning this test can be used) for the duration of the COVID-19 declaration under Section 564(b)(1) of the Act, 21 U.S.C.section 360bbb-3(b)(1), unless the authorization is terminated  or revoked sooner.       Influenza A by PCR NEGATIVE NEGATIVE Final   Influenza B by PCR NEGATIVE NEGATIVE Final    Comment: (NOTE) The Xpert Xpress SARS-CoV-2/FLU/RSV plus assay is intended as an aid in the diagnosis of influenza from Nasopharyngeal swab specimens and should not be used as a sole basis for treatment. Nasal washings and aspirates are unacceptable for Xpert Xpress SARS-CoV-2/FLU/RSV testing.  Fact Sheet for Patients: BloggerCourse.com  Fact Sheet for Healthcare Providers: SeriousBroker.it  This test is not yet approved or cleared by the [place]  FDA and has been authorized for detection and/or diagnosis of SARS-CoV-2 by FDA under an Emergency Use Authorization (EUA). This EUA will remain in effect (meaning this test can be used) for the duration of the COVID-19 declaration under Section 564(b)(1) of the Act, 21 U.S.C. section 360bbb-3(b)(1), unless the authorization is terminated or revoked.     Resp Syncytial Virus by PCR NEGATIVE NEGATIVE Final    Comment: (NOTE) Fact Sheet for Patients: BloggerCourse.com  Fact Sheet for Healthcare Providers: SeriousBroker.it  This test is not yet approved or cleared by the [place]  FDA and has been authorized for detection and/or diagnosis of SARS-CoV-2 by FDA under an Emergency Use Authorization (EUA). This EUA will remain in effect (meaning this  test can be used) for the duration of the COVID-19 declaration under Section 564(b)(1) of the Act, 21 U.S.C. section 360bbb-3(b)(1), unless the authorization is terminated or revoked.  Performed at [facility], [address] [place], [place] [address]     Labs: CBC: Recent Labs  Lab 05/27/23 1716  WBC 8.9  NEUTROABS 8.1*  HGB 15.7  HCT 46.3  MCV 88.5  PLT 255   Basic Metabolic Panel: Recent Labs  Lab 05/27/23 1716 05/28/23 0313 05/29/23 6578  NA 142 136 137  K 3.5 3.4* 4.2  CL 107 105 103  CO2 27 24 25   GLUCOSE 105* 102* 106*  BUN 16 15 13   CREATININE 1.13 0.97 0.83  CALCIUM  8.7* 8.4* 8.8*  MG  --  2.1 2.1   Liver Function Tests: Recent Labs  Lab 05/27/23 1716  AST 24  ALT 18  ALKPHOS 73  BILITOT 1.3*  PROT 6.9  ALBUMIN  4.1   CBG: No results for input(s): "GLUCAP" in the last 168 hours.  Discharge time spent: greater than 30 minutes.  Signed: Donaciano Frizzle, MD [facility] 05/29/2023

## 2023-06-01 ENCOUNTER — Telehealth: Payer: Self-pay

## 2023-06-04 ENCOUNTER — Emergency Department

## 2023-06-04 ENCOUNTER — Other Ambulatory Visit: Payer: Self-pay

## 2023-06-04 ENCOUNTER — Emergency Department
Admission: EM | Admit: 2023-06-04 | Discharge: 2023-06-04 | Disposition: A | Discharge status: Home / Self Care | Attending: Emergency Medicine | Admitting: Emergency Medicine

## 2023-06-04 DIAGNOSIS — Y92410 Unspecified street and highway as the place of occurrence of the external cause: Secondary | ICD-10-CM | POA: Diagnosis not present

## 2023-06-04 DIAGNOSIS — S70211A Abrasion, right hip, initial encounter: Secondary | ICD-10-CM | POA: Diagnosis not present

## 2023-06-04 DIAGNOSIS — S80211A Abrasion, right knee, initial encounter: Secondary | ICD-10-CM | POA: Diagnosis not present

## 2023-06-04 DIAGNOSIS — S40211A Abrasion of right shoulder, initial encounter: Secondary | ICD-10-CM | POA: Insufficient documentation

## 2023-06-04 DIAGNOSIS — S90511A Abrasion, right ankle, initial encounter: Secondary | ICD-10-CM | POA: Insufficient documentation

## 2023-06-04 DIAGNOSIS — S4991XA Unspecified injury of right shoulder and upper arm, initial encounter: Secondary | ICD-10-CM | POA: Diagnosis present

## 2023-06-04 MED ORDER — OXYCODONE HCL 5 MG PO TABS
5.0000 mg | ORAL_TABLET | Freq: Once | ORAL | Status: AC
  Administered 2023-06-04: 5 mg via ORAL
  Filled 2023-06-04: qty 1

## 2023-06-04 MED ORDER — OXYCODONE HCL 5 MG PO TABS
5.0000 mg | ORAL_TABLET | Freq: Three times a day (TID) | ORAL | 0 refills | Status: DC | PRN
  Filled 2023-06-04: qty 10, 4d supply, fill #0

## 2023-06-04 MED ORDER — KETOROLAC TROMETHAMINE 30 MG/ML IJ SOLN
30.0000 mg | Freq: Once | INTRAMUSCULAR | Status: AC
  Administered 2023-06-04: 30 mg via INTRAMUSCULAR
  Filled 2023-06-04: qty 1

## 2023-06-04 MED ORDER — ACETAMINOPHEN 500 MG PO TABS
1000.0000 mg | ORAL_TABLET | Freq: Once | ORAL | Status: AC
  Administered 2023-06-04: 1000 mg via ORAL
  Filled 2023-06-04: qty 2
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
## 2023-06-04 NOTE — Discharge Instructions (Signed)
Gently wash the wound with soap and water.  It is okay to shower, but do not submerge in a bath or go swimming as it is healing.  Do not vigorously scrub.   Gently pat dry.   Once dry, then apply Neosporin or bacitracin or even Vaseline ointment to the area to act as a barrier to help prevent infection.    Please take Tylenol and ibuprofen/Advil for your pain.  It is safe to take them together, or to alternate them every few hours.  Take up to 1000mg  of Tylenol at a time, up to 4 times per day.  Do not take more than 4000 mg of Tylenol in 24 hours.  For ibuprofen, take 400-600 mg, 3 - 4 times per day.
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
## 2023-06-18 ENCOUNTER — Telehealth: Payer: Self-pay

## 2023-06-25 ENCOUNTER — Ambulatory Visit (INDEPENDENT_AMBULATORY_CARE_PROVIDER_SITE_OTHER): Admitting: Vascular Surgery

## 2023-06-25 ENCOUNTER — Encounter (INDEPENDENT_AMBULATORY_CARE_PROVIDER_SITE_OTHER): Payer: Self-pay | Admitting: Vascular Surgery

## 2023-06-25 VITALS — BP 118/72 | HR 79 | Resp 18 | Ht 65.0 in | Wt 134.4 lb

## 2023-06-25 DIAGNOSIS — I6521 Occlusion and stenosis of right carotid artery: Secondary | ICD-10-CM | POA: Diagnosis not present

## 2023-06-25 DIAGNOSIS — I6381 Other cerebral infarction due to occlusion or stenosis of small artery: Secondary | ICD-10-CM | POA: Diagnosis not present

## 2023-06-25 DIAGNOSIS — I10 Essential (primary) hypertension: Secondary | ICD-10-CM | POA: Diagnosis not present

## 2023-06-25 DIAGNOSIS — R7303 Prediabetes: Secondary | ICD-10-CM | POA: Diagnosis not present

## 2023-06-25 NOTE — Assessment & Plan Note (Signed)
 The patient has an 80% or greater right internal carotid artery stenosis at its origin.  I have independently reviewed his CT angiogram.  This is extensively calcified and a suboptimal candidate for stent placement.  I have recommended right carotid endarterectomy for stroke risk reduction.  It was again stressed that this did not have anything to do with his left-sided stroke from last month.  It was discussed that we were doing this only for stroke risk reduction long-term.  He is on aspirin  and a statin agent and should continue these.  I have discussed the risks and benefits of the procedure.  I discussed the 2 to 3% risk of stroke, myocardial infarction, bleeding, infection, and cranial nerve injury.  I have discussed that we essentially perform the surgery and trade upfront risk for long-term benefit.  He voices his understanding and desires to proceed.

## 2023-06-25 NOTE — Progress Notes (Signed)
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 according to the departmental dose-optimization program which includes automated exposure control, adjustment of the mA and/or kV according to patient size and/or use of iterative reconstruction technique. CONTRAST:  OMNIPAQUE  IOHEXOL  300 MG/ML  SOLN COMPARISON:  01/30/2015 FINDINGS: Lower chest: No acute abnormality. Hepatobiliary: No focal liver abnormality is seen. No gallstones, gallbladder wall thickening, or biliary dilatation. Pancreas: Unremarkable. No pancreatic ductal dilatation or surrounding inflammatory changes. Spleen: Normal in size without focal abnormality. Adrenals/Urinary Tract: Adrenal glands are within normal limits. Kidneys are well visualized bilaterally. No renal calculi or obstructive changes are noted. The  bladder is well distended. Stomach/Bowel: Colon is decompressed. No obstructive changes are noted. The appendix is within normal limits. Small bowel and stomach are unremarkable. Vascular/Lymphatic: Aortic atherosclerosis. No enlarged abdominal or pelvic lymph nodes. Reproductive: Prostate is unremarkable. Other: No abdominal wall hernia or abnormality. No abdominopelvic ascites. Musculoskeletal: No acute or significant osseous findings. IMPRESSION: No acute abnormality to correspond with the given clinical history.  Electronically Signed   By: Violeta Grey M.D.   On: 05/27/2023 20:04    Labs Recent Results (from the past 2160 hours)  Protime-INR     Status: None   Collection Time: 05/18/23 10:50 AM  Result Value Ref Range   Prothrombin Time 12.5 11.4 - 15.2 seconds   INR 0.9 0.8 - 1.2    Comment: (NOTE) INR goal varies based on device and disease states. Performed at [facility], [address] [place], [place] [address]   APTT     Status: None   Collection Time: 05/18/23 10:50 AM  Result Value Ref Range   aPTT 34 24 - 36 seconds    Comment: Performed at [facility], [address] [place], [place] [address]  CBC     Status: None   Collection Time: 05/18/23 10:50 AM  Result Value Ref Range   WBC 5.3 4.0 - 10.5 K/uL   RBC 5.42 4.22 - 5.81 MIL/uL   Hemoglobin 16.1 13.0 - 17.0 g/dL   HCT 09.8 11.9 - 14.7 %   MCV 88.4 80.0 - 100.0 fL   MCH 29.7 26.0 - 34.0 pg   MCHC 33.6 30.0 - 36.0 g/dL   RDW 82.9 56.2 - 13.0 %   Platelets 249 150 - 400 K/uL   nRBC 0.0 0.0 - 0.2 %    Comment: Performed at [facility], [address] [place], [place] [address]  Differential     Status: None   Collection Time: 05/18/23 10:50 AM  Result Value Ref Range   Neutrophils Relative % 65 %   Neutro Abs 3.4 1.7 - 7.7 K/uL   Lymphocytes Relative 24 %   Lymphs Abs 1.2 0.7 - 4.0 K/uL   Monocytes Relative 8 %   Monocytes Absolute 0.4 0.1 - 1.0 K/uL   Eosinophils Relative 2 %   Eosinophils Absolute 0.1 0.0 - 0.5 K/uL   Basophils Relative 1 %   Basophils Absolute 0.1 0.0 - 0.1 K/uL   Immature Granulocytes 0 %   Abs Immature Granulocytes 0.01 0.00 - 0.07 K/uL    Comment: Performed at [facility], [address] [place], [place] [address]  Comprehensive metabolic panel     Status: Abnormal   Collection Time: 05/18/23 10:50 AM  Result Value Ref Range   Sodium 140 135 - 145 mmol/L   Potassium 3.6 3.5 - 5.1 mmol/L   Chloride 103 98 - 111 mmol/L   CO2 28 22 - 32 mmol/L    Glucose, Bld 161 (H) 70 - 99 mg/dL    Comment: Glucose reference range applies only to samples taken after fasting for at least 8 hours.   BUN 16 6 - 20 mg/dL   Creatinine, Ser 9.52 0.61 - 1.24 mg/dL   Calcium  9.3 8.9 - 10.3 mg/dL   Total Protein 7.0 6.5 - 8.1 g/dL   Albumin  4.1 3.5 - 5.0 g/dL   AST 20 15 - 41 U/L   ALT 14 0 - 44 U/L   Alkaline Phosphatase 81 38 - 126 U/L   Total Bilirubin 0.8  0.0 - 1.2 mg/dL   GFR, Estimated >10 >27 mL/min    Comment: (NOTE) Calculated using the CKD-EPI Creatinine Equation (2021)    Anion gap 9 5 - 15    Comment: Performed at [facility], [address] [place], [place] [address]  Ethanol     Status: None   Collection Time: 05/18/23 10:50 AM  Result Value Ref Range   Alcohol, Ethyl (B) <10 <10 mg/dL    Comment: (NOTE) Lowest detectable limit for serum alcohol is 10 mg/dL.  For medical purposes only. Performed at [facility], [address] [place], [place] [address]   CBC with Differential     Status: Abnormal   Collection Time: 05/27/23  5:16 PM  Result Value Ref Range   WBC 8.9 4.0 - 10.5 K/uL   RBC 5.23 4.22 - 5.81 MIL/uL   Hemoglobin 15.7 13.0 - 17.0 g/dL   HCT 74.2 59.5 - 63.8 %   MCV 88.5 80.0 - 100.0 fL   MCH 30.0 26.0 - 34.0 pg   MCHC 33.9 30.0 - 36.0 g/dL   RDW 75.6 43.3 - 29.5 %   Platelets 255 150 - 400 K/uL   nRBC 0.0 0.0 - 0.2 %   Neutrophils Relative % 91 %   Neutro Abs 8.1 (H) 1.7 - 7.7 K/uL   Lymphocytes Relative 4 %   Lymphs Abs 0.4 (L) 0.7 - 4.0 K/uL   Monocytes Relative 4 %   Monocytes Absolute 0.3 0.1 - 1.0 K/uL   Eosinophils Relative 1 %   Eosinophils Absolute 0.1 0.0 - 0.5 K/uL   Basophils Relative 0 %   Basophils Absolute 0.0 0.0 - 0.1 K/uL   Immature Granulocytes 0 %   Abs Immature Granulocytes 0.02 0.00 - 0.07 K/uL    Comment: Performed at [facility], [address] [place], [place] [address]  Comprehensive metabolic panel     Status: Abnormal   Collection Time:  05/27/23  5:16 PM  Result Value Ref Range   Sodium 142 135 - 145 mmol/L   Potassium 3.5 3.5 - 5.1 mmol/L   Chloride 107 98 - 111 mmol/L   CO2 27 22 - 32 mmol/L   Glucose, Bld 105 (H) 70 - 99 mg/dL    Comment: Glucose reference range applies only to samples taken after fasting for at least 8 hours.   BUN 16 8 - 23 mg/dL   Creatinine, Ser 6.60 0.61 - 1.24 mg/dL   Calcium  8.7 (L) 8.9 - 10.3 mg/dL   Total Protein 6.9 6.5 - 8.1 g/dL   Albumin  4.1 3.5 - 5.0 g/dL   AST 24 15 - 41 U/L   ALT 18 0 - 44 U/L   Alkaline Phosphatase 73 38 - 126 U/L   Total Bilirubin 1.3 (H) 0.0 - 1.2 mg/dL   GFR, Estimated >63 >01 mL/min    Comment: (NOTE) Calculated using the CKD-EPI Creatinine Equation (2021)    Anion gap 8 5 - 15    Comment: Performed at [facility], [address] [place], [place] [address]  Lipase, blood     Status: None   Collection Time: 05/27/23  5:16 PM  Result Value Ref Range   Lipase 28 11 - 51 U/L    Comment: Performed at [facility], [address] [place], [place] [address]  Hemoglobin A1c     Status: None   Collection Time: 05/27/23  5:16 PM  Result Value Ref Range   Hgb A1c  MFr Bld 5.2 4.8 - 5.6 %    Comment: (NOTE) Pre diabetes:          5.7%-6.4%  Diabetes:              >6.4%  Glycemic control for   <7.0% adults with diabetes    Mean Plasma Glucose 102.54 mg/dL    Comment: Performed at [facility], [address], [place], [place] [address]  Lipid panel     Status: None   Collection Time: 05/28/23  3:13 AM  Result Value Ref Range   Cholesterol 140 0 - 200 mg/dL   Triglycerides 54 <829 mg/dL   HDL 57 >56 mg/dL   Total CHOL/HDL Ratio 2.5 RATIO   VLDL 11 0 - 40 mg/dL   LDL Cholesterol 72 0 - 99 mg/dL    Comment:        Total Cholesterol/HDL:CHD Risk Coronary Heart Disease Risk Table                     Men   Women  1/2 Average Risk   3.4   3.3  Average Risk       5.0   4.4  2 X Average Risk   9.6   7.1  3 X Average Risk  23.4    11.0        Use the calculated Patient Ratio above and the CHD Risk Table to determine the patient's CHD Risk.        ATP III CLASSIFICATION (LDL):  <100     mg/dL   Optimal  213-086  mg/dL   Near or Above                    Optimal  130-159  mg/dL   Borderline  578-469  mg/dL   High  >629     mg/dL   Very High Performed at [facility], [address] [place], [place] [address]   HIV Antibody (routine testing w rflx)     Status: None   Collection Time: 05/28/23  3:13 AM  Result Value Ref Range   HIV Screen 4th Generation wRfx Non Reactive Non Reactive    Comment: Performed at [facility], [address], [place], [place] [address]  Protime-INR     Status: None   Collection Time: 05/28/23  3:13 AM  Result Value Ref Range   Prothrombin Time 13.6 11.4 - 15.2 seconds   INR 1.0 0.8 - 1.2    Comment: (NOTE) INR goal varies based on device and disease states. Performed at [facility], [address] [place], [place] [address]   APTT     Status: None   Collection Time: 05/28/23  3:13 AM  Result Value Ref Range   aPTT 33 24 - 36 seconds    Comment: Performed at [facility], [address] [place], [place] [address]  Basic metabolic panel with GFR     Status: Abnormal   Collection Time: 05/28/23  3:13 AM  Result Value Ref Range   Sodium 136 135 - 145 mmol/L   Potassium 3.4 (L) 3.5 - 5.1 mmol/L   Chloride 105 98 - 111 mmol/L   CO2 24 22 - 32 mmol/L   Glucose, Bld 102 (H) 70 - 99 mg/dL    Comment: Glucose reference range applies only to samples taken after fasting for at least 8 hours.   BUN 15 8 - 23 mg/dL  Creatinine, Ser 0.97 0.61 - 1.24 mg/dL   Calcium  8.4 (L) 8.9 - 10.3 mg/dL   GFR, Estimated >47 >82 mL/min    Comment: (NOTE) Calculated using the CKD-EPI Creatinine Equation (2021)    Anion gap 7 5 - 15    Comment: Performed at [facility], [address] [place], [place] [address]  Magnesium      Status: None    Collection Time: 05/28/23  3:13 AM  Result Value Ref Range   Magnesium  2.1 1.7 - 2.4 mg/dL    Comment: Performed at [facility], [address] [place], [place] [address]  ECHOCARDIOGRAM COMPLETE     Status: None   Collection Time: 05/28/23  3:33 PM  Result Value Ref Range   Weight 2,240 oz   Height 65 in   BP 119/68 mmHg   Ao pk vel 0.96 m/s   AV Peak grad 3.6 mmHg   Area-P 1/2 4.29 cm2   Est EF 55 - 60%   Urine Drug Screen, Qualitative ([facility] only)     Status: Abnormal   Collection Time: 05/28/23  6:45 PM  Result Value Ref Range   Tricyclic, Ur Screen POSITIVE (A) NONE DETECTED   Amphetamines, Ur Screen NONE DETECTED NONE DETECTED   MDMA (Ecstasy)Ur Screen NONE DETECTED NONE DETECTED   Cocaine Metabolite,Ur [place] POSITIVE (A) NONE DETECTED   Opiate, Ur Screen POSITIVE (A) NONE DETECTED   Phencyclidine (PCP) Ur S NONE DETECTED NONE DETECTED   Cannabinoid 50 Ng, Ur [place] POSITIVE (A) NONE DETECTED   Barbiturates, Ur Screen NONE DETECTED NONE DETECTED   Benzodiazepine, Ur Scrn NONE DETECTED NONE DETECTED   Methadone Scn, Ur NONE DETECTED NONE DETECTED    Comment: (NOTE) Tricyclics + metabolites, urine    Cutoff 1000 ng/mL Amphetamines + metabolites, urine  Cutoff 1000 ng/mL MDMA (Ecstasy), urine              Cutoff 500 ng/mL Cocaine Metabolite, urine          Cutoff 300 ng/mL Opiate + metabolites, urine        Cutoff 300 ng/mL Phencyclidine (PCP), urine         Cutoff 25 ng/mL Cannabinoid, urine                 Cutoff 50 ng/mL Barbiturates + metabolites, urine  Cutoff 200 ng/mL Benzodiazepine, urine              Cutoff 200 ng/mL Methadone, urine                   Cutoff 300 ng/mL  The urine drug screen provides only a preliminary, unconfirmed analytical test result and should not be used for non-medical purposes. Clinical consideration and professional judgment should be applied to any positive drug screen result due to possible interfering substances. A more specific  alternate chemical method must be used in order to obtain a confirmed analytical result. Gas chromatography / mass spectrometry (GC/MS) is the preferred confirm atory method. Performed at [facility], [address] [place], [place] [address]   Basic metabolic panel with GFR     Status: Abnormal   Collection Time: 05/29/23  5:08 AM  Result Value Ref Range   Sodium 137 135 - 145 mmol/L   Potassium 4.2 3.5 - 5.1 mmol/L   Chloride 103 98 - 111 mmol/L   CO2 25 22 - 32 mmol/L   Glucose, Bld 106 (H) 70 - 99 mg/dL    Comment: Glucose  reference range applies only to samples taken after fasting for at least 8 hours.   BUN 13 8 - 23 mg/dL   Creatinine, Ser 0.98 0.61 - 1.24 mg/dL   Calcium  8.8 (L) 8.9 - 10.3 mg/dL   GFR, Estimated >11 >91 mL/min    Comment: (NOTE) Calculated using the CKD-EPI Creatinine Equation (2021)    Anion gap 9 5 - 15    Comment: Performed at [facility], [address] [place], [place] [address]  Magnesium      Status: None   Collection Time: 05/29/23  5:08 AM  Result Value Ref Range   Magnesium  2.1 1.7 - 2.4 mg/dL    Comment: Performed at [facility], [address] [place], [place] [address]    Assessment/Plan:  Stroke [facility] Small, left-sided, and not related to his carotid disease.  Seems to have recovered largely from this.  HTN (hypertension) blood pressure control important in reducing the progression of atherosclerotic disease. On appropriate oral medications.   Stenosis of right carotid artery The patient has an 80% or greater right internal carotid artery stenosis at its origin.  I have independently reviewed his CT angiogram.  This is extensively calcified and a suboptimal candidate for stent placement.  I have recommended right carotid endarterectomy for stroke risk reduction.  It was again stressed that this did not have anything to do with his left-sided stroke from last month.  It was discussed that we were doing  this only for stroke risk reduction long-term.  He is on aspirin  and a statin agent and should continue these.  I have discussed the risks and benefits of the procedure.  I discussed the 2 to 3% risk of stroke, myocardial infarction, bleeding, infection, and cranial nerve injury.  I have discussed that we essentially perform the surgery and trade upfront risk for long-term benefit.  He voices his understanding and desires to proceed.      Mikki Alexander 06/25/2023, 4:02 PM   This note was created with Dragon medical transcription system.  Any errors from dictation are unintentional.

## 2023-06-25 NOTE — Assessment & Plan Note (Signed)
 Small, left-sided, and not related to his carotid disease.  Seems to have recovered largely from this.

## 2023-06-25 NOTE — Patient Instructions (Signed)
Carotid Endarterectomy A carotid endarterectomy is a surgery to remove a blockage in the carotid arteries. The carotid arteries are the large blood vessels on both sides of the neck that supply blood to the brain. Carotid artery disease, also called carotid artery stenosis, is the narrowing or blockage of one or both carotid arteries. Carotid artery disease is usually caused by atherosclerosis, which is a buildup of fat and plaque in the arteries. Some buildup of plaque normally occurs with aging. The plaque may partially or totally block blood flow or cause a clot to form in the carotid arteries. This may cause a stroke. Tell a health care provider about: Any allergies you have. All medicines you are taking, including vitamins, herbs, eye drops, creams, and over-the-counter medicines. Any problems you or family members have had with anesthetic medicines. Any bleeding problems you have. Any surgeries you have had. Any medical conditions you have, or have had, including diabetes, kidney problems, and infections. Whether you are pregnant or may be pregnant. What are the risks? Generally, this is a safe procedure. However, problems may occur, including: Infection. Bleeding. Blood clots. Allergic reactions to medicines. Damage to nerves near the carotid arteries. This can cause a hoarse voice or weakness of muscles in your face. Stroke. Seizures. Heart attack (myocardial infarction). Narrowing of the opened blood vessel (restenosis). This may require another surgery. What happens before the procedure? When to stop eating and drinking Follow instructions from your health care provider about what you may eat and drink before your procedure. These may include: 8 hours before your procedure Stop eating most foods. Do not eat meat, fried foods, or fatty foods. Eat only light foods, such as toast or crackers. All liquids are okay except energy drinks and alcohol. 6 hours before your procedure Stop  eating. Drink only clear liquids, such as water, clear fruit juice, black coffee, plain tea, and sports drinks. Do not drink energy drinks or alcohol. 2 hours before your procedure Stop drinking all liquids. You may be allowed to take medicines with small sips of water. If you do not follow your health care provider's instructions, your procedure may be delayed or canceled. Medicines Ask your health care provider about: Changing or stopping your regular medicines. This is especially important if you are taking diabetes medicines or blood thinners. Starting aspirin prior to the surgery if you are not already on a daily aspirin. Taking medicines such as naproxen or ibuprofen. These medicines can thin your blood. Do not take these medicines unless your health care provider tells you to take them. Taking over-the-counter medicines, vitamins, herbs, and supplements. General instructions Do not use any products that contain nicotine or tobacco for at least 4 weeks before the procedure. These products include cigarettes, chewing tobacco, and vaping devices, such as e-cigarettes. If you need help quitting, ask your health care provider. You may need to have blood tests, a test to check heart rhythm (electrocardiogram, or ECG), or a test to check blood flow (angiogram). Ask your health care provider: How your surgery site will be marked. What steps will be taken to help prevent infection. These steps may include: Removing hair at the surgery site. Washing skin with a germ-killing soap. Taking antibiotic medicine. Plan to have a responsible adult: Take you home from the hospital or clinic. You will not be allowed to drive. Care for you for the time you are told. What happens during the procedure?  An IV will be inserted into one of your veins. You  will be given one or more of the following: A medicine to help you relax (sedative). A medicine to make you fall asleep (general anesthetic). An  electroencephalogram (EEG), where electrodes with thin wires are pasted on to your scalp, is used to monitor brain activity during the procedure. The surgeon will make a small incision in your neck to expose the carotid artery. A tube may be inserted into the carotid artery above and below the blockage. This tube will allow blood to flow around the blockage during the surgery. An incision will be made in the carotid artery at the location of the blockage. The blockage will be removed. In some cases, a section of the carotid artery may be removed and a graft patch may be used to repair the artery. The carotid artery will be closed with stitches (sutures). If a tube was inserted into the artery to allow blood flow around the blockage during surgery, the tube will be removed. Once the tube is removed, blood flow through the carotid artery will be restored. The incision in the neck will be closed with sutures. A bandage (dressing) will be placed over your incision. The procedure may vary among health care providers and hospitals. What happens after the procedure? Your blood pressure, heart rate, breathing rate, and blood oxygen level will be monitored until you leave the hospital or clinic. You may have some pain or an ache in your neck for about 2 weeks. This is normal. If you were given a sedative during the procedure, it can affect you for several hours. Do not drive or operate machinery until your health care provider says that it is safe. Summary A carotid endarterectomy is a surgery to remove a blockage in the internal carotid arteries. The internal carotid arteries are the large blood vessels on both sides of the neck that supply blood to the brain. Before the procedure, ask your health care provider about changing or stopping your regular medicines. Follow instructions from your health care provider about eating and drinking before the procedure. If you were given a sedative during the  procedure, it can affect you for several hours. Do not drive or operate machinery until your health care provider says that it is safe. This information is not intended to replace advice given to you by your health care provider. Make sure you discuss any questions you have with your health care provider. Document Revised: 12/24/2020 Document Reviewed: 12/24/2020 Elsevier Patient Education  2024 [employer].

## 2023-06-25 NOTE — Assessment & Plan Note (Signed)
 blood pressure control important in reducing the progression of atherosclerotic disease. On appropriate oral medications.

## 2023-07-01 ENCOUNTER — Other Ambulatory Visit: Payer: Self-pay | Admitting: Cardiology

## 2023-07-01 DIAGNOSIS — Z01818 Encounter for other preprocedural examination: Secondary | ICD-10-CM

## 2023-07-22 ENCOUNTER — Ambulatory Visit
Admission: RE | Admit: 2023-07-22 | Discharge: 2023-07-22 | Disposition: A | Discharge status: Home / Self Care | Source: Ambulatory Visit | Attending: Cardiology | Admitting: Cardiology

## 2023-07-22 DIAGNOSIS — Z01818 Encounter for other preprocedural examination: Secondary | ICD-10-CM | POA: Insufficient documentation

## 2023-08-08 ENCOUNTER — Emergency Department
Admission: EM | Admit: 2023-08-08 | Discharge: 2023-08-09 | Disposition: A | Discharge status: Home / Self Care | Attending: Emergency Medicine | Admitting: Emergency Medicine

## 2023-08-08 ENCOUNTER — Encounter: Payer: Self-pay | Admitting: Emergency Medicine

## 2023-08-08 ENCOUNTER — Emergency Department

## 2023-08-08 ENCOUNTER — Other Ambulatory Visit: Payer: Self-pay

## 2023-08-08 DIAGNOSIS — R1084 Generalized abdominal pain: Secondary | ICD-10-CM | POA: Insufficient documentation

## 2023-08-08 DIAGNOSIS — R197 Diarrhea, unspecified: Secondary | ICD-10-CM | POA: Diagnosis not present

## 2023-08-08 DIAGNOSIS — Z8673 Personal history of transient ischemic attack (TIA), and cerebral infarction without residual deficits: Secondary | ICD-10-CM | POA: Insufficient documentation

## 2023-08-08 DIAGNOSIS — R112 Nausea with vomiting, unspecified: Secondary | ICD-10-CM | POA: Insufficient documentation

## 2023-08-08 LAB — COMPREHENSIVE METABOLIC PANEL WITH GFR
ALT: 15 U/L (ref 0–44)
AST: 21 U/L (ref 15–41)
Albumin: 4.4 g/dL (ref 3.5–5.0)
Alkaline Phosphatase: 78 U/L (ref 38–126)
Anion gap: 12 (ref 5–15)
BUN: 29 mg/dL — ABNORMAL HIGH (ref 8–23)
CO2: 26 mmol/L (ref 22–32)
Calcium: 9.5 mg/dL (ref 8.9–10.3)
Chloride: 103 mmol/L (ref 98–111)
Creatinine, Ser: 1.03 mg/dL (ref 0.61–1.24)
GFR, Estimated: >60 mL/min (ref >60)
Glucose, Bld: 108 mg/dL — ABNORMAL HIGH (ref 70–99)
Potassium: 3.6 mmol/L (ref 3.5–5.1)
Sodium: 141 mmol/L (ref 135–145)
Total Bilirubin: 0.6 mg/dL (ref 0.0–1.2)
Total Protein: 7.4 g/dL (ref 6.5–8.1)

## 2023-08-08 LAB — CBC
HCT: 45 % (ref 39.0–52.0)
Hemoglobin: 15.2 g/dL (ref 13.0–17.0)
MCH: 29.6 pg (ref 26.0–34.0)
MCHC: 33.8 g/dL (ref 30.0–36.0)
MCV: 87.7 fL (ref 80.0–100.0)
Platelets: 311 10*3/uL (ref 150–400)
RBC: 5.13 MIL/uL (ref 4.22–5.81)
RDW: 13.3 % (ref 11.5–15.5)
WBC: 8.7 10*3/uL (ref 4.0–10.5)
nRBC: 0 % (ref 0.0–0.2)

## 2023-08-08 LAB — LIPASE, BLOOD: Lipase: 30 U/L (ref 11–51)

## 2023-08-08 MED ORDER — SODIUM CHLORIDE 0.9 % IV BOLUS
1000.0000 mL | Freq: Once | INTRAVENOUS | Status: AC
  Administered 2023-08-08: 1000 mL via INTRAVENOUS

## 2023-08-08 MED ORDER — ONDANSETRON HCL 4 MG/2ML IJ SOLN
4.0000 mg | Freq: Once | INTRAMUSCULAR | Status: AC
  Administered 2023-08-08: 4 mg via INTRAVENOUS
  Filled 2023-08-08: qty 2

## 2023-08-08 MED ORDER — IOHEXOL 300 MG/ML  SOLN
100.0000 mL | Freq: Once | INTRAMUSCULAR | Status: AC | PRN
  Administered 2023-08-08: 100 mL via INTRAVENOUS

## 2023-08-08 MED ORDER — MORPHINE SULFATE (PF) 2 MG/ML IV SOLN
2.0000 mg | Freq: Once | INTRAVENOUS | Status: AC
  Administered 2023-08-08: 2 mg via INTRAVENOUS
  Filled 2023-08-08: qty 1

## 2023-08-08 NOTE — ED Triage Notes (Addendum)
 FIRST NURSE NOTE:  Pt arrived via [facility] with c/o abd pain, vomiting, diarrhea since noon today, rates pain 10/10 per EMS  CBG 101 146/86 96% RA P 85 97.7  Hx of CVA 2 months ago.

## 2023-08-08 NOTE — ED Notes (Signed)
 Modesto Charon, MD at bedside

## 2023-08-08 NOTE — ED Triage Notes (Signed)
 Pt to ED via EMS from his mother's house. Pt states abd pain that has bent me double. Pt also c/o vomiting and diarrhea. Pt c/o epigastric pain as well. Pt alert and oriented in triage. Pt c/o feeling like pain is a burning sensation at this time.

## 2023-08-08 NOTE — ED Provider Notes (Signed)
 [facility] Provider Note    Event Date/Time   First MD Initiated Contact with Patient 08/08/23 2256     (approximate)   History   Abdominal Pain   HPI  Alvin Gonzalez. is a 63 y.o. male   Past medical history of PTSD, substance use, chronic pain, adrenal insufficiency who presents to the emergency department nausea vomiting diarrhea over the last 1 day.  He has had a chronic history of bouts of nausea vomiting diarrhea in the past.  He states diffuse abdominal pain.  No GI bleeding.  No urinary symptoms or testicular pain.  Denies chest pain or shortness of breath.  No fevers or chills.  External Medical Documents Reviewed: Hospitalization notes from earlier this year diagnosed with a stroke      Physical Exam   Triage Vital Signs: ED Triage Vitals  Encounter Vitals Group     BP 08/08/23 2200 (!) 138/99     Girls Systolic BP Percentile --      Girls Diastolic BP Percentile --      Boys Systolic BP Percentile --      Boys Diastolic BP Percentile --      Pulse Rate 08/08/23 2200 88     Resp 08/08/23 2200 18     Temp 08/08/23 2200 98.1 F (36.7 C)     Temp Source 08/08/23 2200 Oral     SpO2 08/08/23 2200 99 %     Weight 08/08/23 2201 135 lb (61.2 kg)     Height 08/08/23 2201 5' 5 (1.651 m)     Head Circumference --      Peak Flow --      Pain Score 08/08/23 2201 8     Pain Loc --      Pain Education --      Exclude from Growth Chart --     Most recent vital signs: Vitals:   08/08/23 2200 08/08/23 2330  BP: (!) 138/99 (!) 141/90  Pulse: 88 72  Resp: 18 18  Temp: 98.1 F (36.7 C)   SpO2: 99% 97%    General: Awake, no distress.  CV:  Good peripheral perfusion.  Resp:  Normal effort.  Abd:  No distention.  Other:  Awake alert comfortable appearing normal vital signs slightly hypertensive with mild diffuse tenderness to palpation the abdomen without rigidity or guarding.  Skin appears warm well-perfused.   ED Results /  Procedures / Treatments   Labs (all labs ordered are listed, but only abnormal results are displayed) Labs Reviewed  COMPREHENSIVE METABOLIC PANEL WITH GFR - Abnormal; Notable for the following components:      Result Value   Glucose, Bld 108 (*)    BUN 29 (*)    All other components within normal limits  LIPASE, BLOOD  CBC  URINALYSIS, ROUTINE W REFLEX MICROSCOPIC     I ordered and reviewed the above labs they are notable for no leukocytosis, lipase normal, LFTs normal  EKG  ED ECG REPORT I, Ginnie Shams, the attending physician, personally viewed and interpreted this ECG.   Date: 08/08/2023  EKG Time: 2205  Rate: 81  Rhythm: sinus  Axis: nl  Intervals:nl  ST&T Change: no stemi    RADIOLOGY I independently reviewed and interpreted CT abdomen pelvis see no obvious obstructive or inflammatory changes I also reviewed radiologist's formal read.   PROCEDURES:  Critical Care performed: No  Procedures   MEDICATIONS ORDERED IN ED: Medications  sodium chloride  0.9 %  bolus 1,000 mL (1,000 mLs Intravenous New Bag/Given 08/08/23 2327)  ondansetron  (ZOFRAN ) injection 4 mg (4 mg Intravenous Given 08/08/23 2328)  morphine  (PF) 2 MG/ML injection 2 mg (2 mg Intravenous Given 08/08/23 2328)  iohexol  (OMNIPAQUE ) 300 MG/ML solution 100 mL (100 mLs Intravenous Contrast Given 08/08/23 2352)    IMPRESSION / MDM / ASSESSMENT AND PLAN / ED COURSE  I reviewed the triage vital signs and the nursing notes.                                Patient's presentation is most consistent with acute presentation with potential threat to life or bodily function.  Differential diagnosis includes, but is not limited to, intra-abdominal infection, obstruction, dehydration or electrolyte derangements   The patient is on the cardiac monitor to evaluate for evidence of arrhythmia and/or significant heart rate changes.  MDM:    Benign abdominal exam, diffuse tenderness, CT abdomen shows no emergent  findings.  Patient stable, hydrated, antiemetic given.  Considered surgical abdominal pathologies but less likely given relatively benign exam and unremarkable lab and CT findings today.  Plan will be for discharge with Zofran , PMD follow-up.       FINAL CLINICAL IMPRESSION(S) / ED DIAGNOSES   Final diagnoses:  Nausea vomiting and diarrhea     Rx / DC Orders   ED Discharge Orders          Ordered    ondansetron  (ZOFRAN ) 4 MG tablet  Every 8 hours PRN        08/09/23 0024             Note:  This document was prepared using Dragon voice recognition software and may include unintentional dictation errors.    Cyrena Mylar, MD 08/09/23 [phone]

## 2023-08-09 ENCOUNTER — Other Ambulatory Visit: Payer: Self-pay

## 2023-08-09 LAB — URINALYSIS, ROUTINE W REFLEX MICROSCOPIC
Bacteria, UA: NONE SEEN
Bilirubin Urine: NEGATIVE
Glucose, UA: NEGATIVE mg/dL
Hgb urine dipstick: NEGATIVE
Ketones, ur: NEGATIVE mg/dL
Leukocytes,Ua: NEGATIVE
Nitrite: NEGATIVE
Protein, ur: 30 mg/dL — AB
Specific Gravity, Urine: 1.039 — ABNORMAL HIGH (ref 1.005–1.030)
Squamous Epithelial / HPF: 0 /HPF (ref 0–5)
pH: 5 (ref 5.0–8.0)

## 2023-08-09 MED ORDER — ONDANSETRON HCL 4 MG PO TABS
4.0000 mg | ORAL_TABLET | Freq: Three times a day (TID) | ORAL | 0 refills | Status: DC | PRN
  Filled 2023-08-09: qty 20, 7d supply, fill #0

## 2023-08-09 NOTE — ED Notes (Signed)
Patient given discharge instructions including prescriptions x1 and importance of follow up appt as needed with stated understanding. INT removed, cannula intact, pressure dressing applied. Patient stable and ambulatory with steady even gait on dispo.

## 2023-08-09 NOTE — Discharge Instructions (Signed)
 Drink plenty of fluids to stay well-hydrated.  Find Pedialyte or similar electrolyte rehydration formulas at your local pharmacy. Take Zofran  for nausea as needed.  Thank you for choosing us  for your health care today!  Please see your primary doctor this week for a follow up appointment.   If you have any new, worsening, or unexpected symptoms call your doctor right away or come back to the emergency department for reevaluation.  It was my pleasure to care for you today.   Ginnie EDISON Cyrena, MD

## 2023-08-16 ENCOUNTER — Telehealth (INDEPENDENT_AMBULATORY_CARE_PROVIDER_SITE_OTHER): Payer: Self-pay

## 2023-08-16 NOTE — Telephone Encounter (Signed)
 I attempted to contact the patient to schedule him for a RCEA with Dr. Marea. Someone answered the patient's phone and stated he was not there and took a message to have him call back.

## 2023-08-17 ENCOUNTER — Telehealth (INDEPENDENT_AMBULATORY_CARE_PROVIDER_SITE_OTHER): Payer: Self-pay

## 2023-08-17 NOTE — Telephone Encounter (Signed)
 I attempted to contact the patient again today to schedule him for his right CEA with Dr. Dew. I now have the prior auth for the surgery and scheduled the patient on 08/26/23 at the [facility]. Pre-admit will call with a pre-op for the [facility]. Pre-surgical instructions will be mailed.

## 2023-08-23 ENCOUNTER — Other Ambulatory Visit: Payer: Self-pay

## 2023-08-23 ENCOUNTER — Encounter: Payer: Self-pay | Admitting: Vascular Surgery

## 2023-08-23 ENCOUNTER — Inpatient Hospital Stay
Admission: RE | Admit: 2023-08-23 | Discharge: 2023-08-23 | Disposition: A | Discharge status: Home / Self Care | Source: Ambulatory Visit | Attending: Vascular Surgery | Admitting: Vascular Surgery

## 2023-08-23 ENCOUNTER — Other Ambulatory Visit (INDEPENDENT_AMBULATORY_CARE_PROVIDER_SITE_OTHER): Payer: Self-pay | Admitting: Nurse Practitioner

## 2023-08-23 VITALS — BP 138/90 | HR 76 | Temp 98.3°F | Resp 18 | Ht 65.0 in | Wt 126.9 lb

## 2023-08-23 DIAGNOSIS — I10 Essential (primary) hypertension: Secondary | ICD-10-CM | POA: Insufficient documentation

## 2023-08-23 DIAGNOSIS — I6521 Occlusion and stenosis of right carotid artery: Secondary | ICD-10-CM

## 2023-08-23 DIAGNOSIS — Z01812 Encounter for preprocedural laboratory examination: Secondary | ICD-10-CM | POA: Diagnosis present

## 2023-08-23 LAB — SURGICAL PCR SCREEN
MRSA, PCR: NEGATIVE
Staphylococcus aureus: POSITIVE — AB

## 2023-08-23 NOTE — Patient Instructions (Signed)
 Your procedure is scheduled on:   THURSDAY JULY 17  Report to the Registration Desk on the 1st floor of the [facility] To find out your arrival time, please call [phone] between 1PM - 3PM on: [facility] JULY 16  If your arrival time is 6:00 am, do not arrive before that time as the [facility] entrance doors do not open until 6:00 am.  REMEMBER: Instructions that are not followed completely may result in serious medical risk, up to and including death; or upon the discretion of your surgeon and anesthesiologist your surgery may need to be rescheduled.  Do not eat food after midnight the night before surgery.  No gum chewing or hard candies.  One week prior to surgery: Stop Anti-inflammatories (NSAIDS) such as Advil, Aleve , Ibuprofen, Motrin, Naproxen , Naprosyn  and Aspirin  based products such as Excedrin, Goody's Powder, BC Powder. Stop ANY OVER THE COUNTER supplements until after surgery.  You may however, continue to take Tylenol  if needed for pain up until the day of surgery.  **Follow recommendations regarding stopping blood thinners.** aspirin  EC 81 do nto take the day of surgery  Continue taking all of your other prescription medications up until the day of surgery.  ON THE DAY OF SURGERY DO NOT TAKE ANY MEDICATIONS  Use inhalers on the day of surgery and bring to the hospital. albuterol  (VENTOLIN  HFA)   No Alcohol for 24 hours before or after surgery.  No Smoking including e-cigarettes for 24 hours before surgery.  No chewable tobacco products for at least 6 hours before surgery.  No nicotine  patches on the day of surgery.  Do not use any recreational drugs for at least a week (preferably 2 weeks) before your surgery.  Please be advised that the combination of cocaine and anesthesia may have negative outcomes, up to and including death. If you test positive for cocaine, your surgery will be cancelled.  On the morning of surgery brush your teeth with toothpaste  and water, you may rinse your mouth with mouthwash if you wish. Do not swallow any toothpaste or mouthwash.  Use CHG Soap as directed on instruction sheet.  Do not wear jewelry, make-up, hairpins, clips or nail polish.  For welded (permanent) jewelry: bracelets, anklets, waist bands, etc.  Please have this removed prior to surgery.  If it is not removed, there is a chance that hospital personnel will need to cut it off on the day of surgery.  Do not wear lotions, powders, or perfumes.   Do not shave body hair from the neck down 48 hours before surgery.  Contact lenses, hearing aids and dentures may not be worn into surgery.  Do not bring valuables to the hospital. [facility] is not responsible for any missing/lost belongings or valuables.   Notify your doctor if there is any change in your medical condition (cold, fever, infection).  Wear comfortable clothing (specific to your surgery type) to the hospital.  After surgery, you can help prevent lung complications by doing breathing exercises.  Take deep breaths and cough every 1-2 hours.  If you are being admitted to the hospital overnight, leave your suitcase in the car. After surgery it may be brought to your room.  In case of increased patient census, it may be necessary for you, the patient, to continue your postoperative care in the Same Day Surgery department.  If you are being discharged the day of surgery, you will not be allowed to drive home. You will need a responsible individual  to drive you home and stay with you for 24 hours after surgery.   If you are taking public transportation, you will need to have a responsible individual with you.  Please call the Pre-admissions Testing Dept. at [phone] if you have any questions about these instructions.  Surgery Visitation Policy:  Patients having surgery or a procedure may have two visitors.  Children under the age of 48 must have an adult with them who is not the  patient.  Inpatient Visitation:    Visiting hours are 7 a.m. to 8 p.m. Up to four visitors are allowed at one time in a patient room. The visitors may rotate out with other people during the day.  One visitor age 25 or older may stay with the patient overnight and must be in the room by 8 p.m.   Merchandiser, retail to address health-related social needs:  [email or web address]      STARTING WEDNESDAY JULY 16   Preparing for Surgery with CHLORHEXIDINE  GLUCONATE (CHG) Soap  Chlorhexidine  Gluconate (CHG) Soap  o An antiseptic cleaner that kills germs and bonds with the skin to continue killing germs even after washing  o Used for showering the night before surgery and morning of surgery  Before surgery, you can play an important role by reducing the number of germs on your skin.  CHG (Chlorhexidine  gluconate) soap is an antiseptic cleanser which kills germs and bonds with the skin to continue killing germs even after washing.  Please do not use if you have an allergy to CHG or antibacterial soaps. If your skin becomes reddened/irritated stop using the CHG.  1. Shower the NIGHT BEFORE SURGERY and the MORNING OF SURGERY with CHG soap.  2. If you choose to wash your hair, wash your hair first as usual with your normal shampoo.  3. After shampooing, rinse your hair and body thoroughly to remove the shampoo.  4. Use CHG as you would any other liquid soap. You can apply CHG directly to the skin and wash gently with a scrungie or a clean washcloth.  5. Apply the CHG soap to your body only from the neck down. Do not use on open wounds or open sores. Avoid contact with your eyes, ears, mouth, and genitals (private parts). Wash face and genitals (private parts) with your normal soap.  6. Wash thoroughly, paying special attention to the area where your surgery will be performed.  7. Thoroughly rinse your body with warm water.  8. Do not shower/wash with your normal  soap after using and rinsing off the CHG soap.  9. Pat yourself dry with a clean towel.  10. Wear clean pajamas to bed the night before surgery.  11. Place clean sheets on your bed the night of your first shower and do not sleep with pets.  12. Shower again with the CHG soap on the day of surgery prior to arriving at the hospital.  13. Do not apply any deodorants/lotions/powders.  14. Please wear clean clothes to the hospital.

## 2023-08-24 ENCOUNTER — Encounter: Payer: Self-pay | Admitting: Urgent Care

## 2023-08-24 NOTE — Progress Notes (Signed)
 Perioperative / Anesthesia Services  Pre-Admission Testing Clinical Review / Pre-Operative Anesthesia Consult  Date: 08/24/23  PATIENT DEMOGRAPHICS: Name: Alvin Gonzalez. DOB: [birth year] MRN:   [id number]  Note: Available PAT nursing documentation and vital signs have been reviewed. Clinical nursing staff has updated patient's PMH/PSHx, current medication list, and drug allergies/intolerances to ensure complete and comprehensive history available to assist care teams in MDM as it pertains to the aforementioned surgical procedure and anticipated anesthetic course. Extensive review of available clinical information personally performed. [place] PMH and PSHx updated with any diagnoses/procedures that  may have been inadvertently omitted during his intake with the pre-admission testing department's nursing staff.  PLANNED SURGICAL PROCEDURE(S):   Case: [id number] Date/Time: 08/26/23 0715   Procedure: ENDARTERECTOMY, CAROTID (Right)   Anesthesia type: General   Diagnosis: Stenosis of right carotid artery [I65.21]   Pre-op diagnosis: STENOSIS OF RIGHT CAROTID ARTERY   Location: [facility] OR ROOM 08 / [facility]S FOR ANEST[facility]IA [facility]   Surgeons: Marea Selinda RAMAN, MD        CLINICAL DISCUSSION: Alvin Gonzalez. is a 63 y.o. male who is submitted for pre-surgical anesthesia review and clearance prior to him undergoing the above procedure. Patient is a Current Smoker (45 pack years). 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  Patient is followed by cardiology [name], MD). He was last seen in the cardiology clinic on 06/29/2023; notes reviewed. At the time of his clinic visit, patient doing well overall from a cardiovascular perspective. Patient denied any chest pain, shortness of breath, PND, orthopnea, palpitations, significant peripheral edema, weakness, fatigue, vertiginous symptoms, or presyncope/syncope. Patient with a past medical history significant for cardiovascular diagnoses. Documented physical exam was grossly benign, providing no evidence of acute exacerbation and/or decompensation of the patient's known cardiovascular conditions.  CT imaging of the brain performed on 05/18/2023 demonstrated evidence of an acute LEFT thalamocapsular lacunar infarction.  Subsequent MRI imaging of the brain performed on 05/27/2023 demonstrated further evidence of an acute perforator infarct in the LEFT basal ganglia.  CTA of the head and neck performed on 05/27/2023 demonstrated significant atherosclerosis at the carotid bifurcation with approximately 80% stenosis of the RICA origin.  No significant stenosis noted contralaterally in the LICA.  BILATERAL intracranial ICAs, MCAs, ACAs,  intradural vertebral arteries, basilar artery, and posterior cerebral arteries patent without evidence of proximal  hemodynamically significant stenosis.  TTE performed on 05/28/2023 revealed a normal left ventricular systolic function with an EF of 55-60%. There were no regional wall motion abnormalities. Left ventricular diastolic Doppler parameters consistent with abnormal relaxation (G1DD). Right ventricular size and function normal with a TAPSE measuring 2.5 cm  (normal range >/= 1.6 cm). There was trivial to mild tricuspid and pulmonic valve regurgitation.  All transvalvular gradients were noted to be normal providing no evidence of hemodynamically significant valvular stenosis. Aorta normal in size with no evidence of ectasia or  aneurysmal dilatation.  Blood pressure reasonably controlled at 140/78 mmHg on currently prescribed CCB (amlodipine ) and diuretic (HCTZ) therapies.  Patient is on atorvastatin  for his HLD diagnosis and ASCVD prevention.  Patient has a prediabetes diagnosis that he is managing with diet  lifestyle modification.  Most recent hemoglobin A1c was 5.2% when checked on 05/27/2023.  Functional capacity somewhat limited by residual post CVA weakness and numbness on the RIGHT.  Additionally, patient has multiple medical comorbidities.  With that said, patient able to complete all of his ADLs/IADLs without cardiovascular limitation.  Per the DASI, patient felt to be able to achieve at least 4 METS physical activity without experiencing any significant degrees of angina/anginal equivalent symptoms.  No changes were made to his medication regimen.  In light of upcoming vascular surgery, nuclear stress test was recommended to further risk stratify patient prior to surgery.  Study was scheduled, however patient ultimately called and canceled the appointment at a later date.  Despite recommendations for a 3-week follow-up, patient ultimately canceled his return to clinic visit with cardiology.  Alvin Earl Pargas Jr. is scheduled for an elective ENDARTERECTOMY, CAROTID (Right) on 08/26/2023 with Dr. [name].  Given patient's past medical history significant for cardiovascular diagnoses, presurgical cardiac clearance was sought by the PAT team.  Per cardiology, I saw this patient for preop evaluation recently for carotid endarterectomy with history of stroke couple months ago.  Echocardiogram was normal.  He has multiple CAD risk factors.  No chest pain symptoms, has some exertional dyspnea.  I recommended a stress test, the patient agreed but canceled the stress test later.  Our office has reached out to him when he continues to mention that he does not want to have the stress test and even canceled the follow-up. Regarding  preop evaluation, I thought the stress test would help further risk stratify him.  As detailed, he does not have any chest pain symptoms (has some exertional dyspnea), EKG/echo with no significant abnormalities in April 2025.  Without stress test, I can say he will be at an elevated but not prohibitive risk for surgery.  Otherwise, the patient is optimized from cardiac standpoint.  As patient does not want stress test, please forward this note to surgical/perioperative team and further plan should be based on patient/surgeon discussion/decision.  In review of the patient's chart, it is noted that he is on daily oral antithrombotic therapy.  Per standing orders for this procedure, patient will continue his daily antithrombotic therapy (low-dose ASA) throughout his perioperative course.  He was asked to hold his dose on the morning of surgery only.  Patient denies previous perioperative complications with anesthesia in the past. In review his EMR, it is noted that patient underwent a general anesthetic course here at [facility] ([facility]) in 09/2021 without documented complications.   MOST RECENT VITAL SIGNS:    08/23/2023   10:48 AM 08/09/2023   12:15 AM 08/08/2023   11:30 PM  Vitals with BMI  Height 5' 5    Weight 126 lbs 14 oz    BMI 21.12    Systolic 138 144 858  Diastolic 90 104 90  Pulse 76 76 72   PROVIDERS/SPECIALISTS: NOTE: Primary physician provider listed below. Patient may have been seen by APP or partner within same practice.   PROVIDER ROLE / SPECIALTY LAST SHERLEAN Gu Selinda GORMAN, MD Vascular Surgery (Surgeon) 06/25/2023  Epifanio Alm SQUIBB, MD Primary Care Provider 06/01/2023  Wilburn Fillers, MD Cardiology 06/29/2023   ALLERGIES: Allergies  Allergen Reactions   Carbamazepine Rash and Hives   Ibuprofen Nausea And Vomiting, Rash and Other (See Comments)    GI Bleeding    Risperidone  Swelling    Tongue swelling   Naproxen  Other (  See Comments)     GI bleeding    CURRENT HOME MEDICATIONS: No current facility-administered medications for this encounter.    albuterol  (VENTOLIN  HFA) 108 (90 Base) MCG/ACT inhaler   amLODipine  (NORVASC ) 10 MG tablet   aspirin  EC 81 MG tablet   atorvastatin  (LIPITOR) 80 MG tablet   hydrochlorothiazide  (HYDRODIURIL ) 25 MG tablet   HISTORY: Past Medical History:  Diagnosis Date   Acute hypoxemic respiratory failure (HCC) 07/27/2021   Adrenal insufficiency (HCC)    Anxiety    Aortic atherosclerosis (HCC)    BPH (benign prostatic hyperplasia)    Cancer (HCC)    Cerebral microvascular disease    Cerebrovascular accident (CVA) of left basal ganglia (HCC) 05/27/2023   a.) Brain MRI 05/27/2023: acute perforator infarct in the LEFT basal ganglia   Cervical disc disease with myelopathy 07/29/2021   Chronic back pain    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, with delusions and tactile/visual hallucinations 04/20/2022   COPD (chronic obstructive pulmonary disease) (HCC) 05/27/2023   Depression    Diastolic dysfunction    Emphysema of lung (HCC)    Erectile dysfunction 03/26/2017   GERD (gastroesophageal reflux disease)    Hemorrhoids    HLD (hyperlipidemia)    Hypertension    Intermittent explosive disorder    Left sided lacunar infarction (HCC) 05/18/2023   a.) CT head 05/18/2023: acute LEFT thalamocapsular lacunar infarction   Polysubstance abuse (HCC)    a.) cocaine + THC + opioids + tobacco   Prediabetes 02/20/2021   PTSD (post-traumatic stress disorder)    Pulmonary nodule 12/10/2016   Right-sided carotid artery disease (HCC)    a.) CTA head/neck 05/27/2023: 80% origin RICA   Risk for falls    Stenosis of right carotid artery 05/28/2023   Suicide attempt (HCC)    prior to 02/18/14 admission    Past Surgical History:  Procedure Laterality Date   CHEST TUBE INSERTION     COLONOSCOPY WITH PROPOFOL  N/A 05/14/2021    Procedure: COLONOSCOPY WITH PROPOFOL ;  Surgeon: Therisa Bi, MD;  Location: [facility] ENDOSCOPY;  Service: Gastroenterology;  Laterality: N/A;   EVALUATION UNDER ANESTHESIA WITH HEMORRHOIDECTOMY N/A 09/10/2021   Procedure: EXAM UNDER ANESTHESIA WITH HEMORRHOIDECTOMY of 2+ columns;  Surgeon: Desiderio Schanz, MD;  Location: [facility] ORS;  Service: General;  Laterality: N/A;  Provider requesting 1.5 hours / 90 minutes for procedure.   LYMPH NODE BIOPSY     Family History  Problem Relation Age of Onset   Diabetes Mother    Hypertension Mother    Other Mother        possible ovarian cancer   Heart disease Father    Hypertension Father    Cirrhosis Father    Liver cancer Father    Lung cancer Sister    Heart attack Maternal Grandmother    Suicidality Maternal Grandfather    Other Paternal Grandmother        unknown medical history   Suicidality Paternal Grandfather    Stomach cancer Cousin    Social History   Tobacco Use   Smoking status: Every Day    Current packs/day: 1.00    Average packs/day: 1 pack/day for 45.0 years (45.0 ttl pk-yrs)    Types: Cigarettes   Smokeless tobacco: Former    Quit date: 01/30/2015  Substance Use Topics   Alcohol use: Yes    Comment: occ   LABS:  Hospital Outpatient Visit on 08/23/2023  Component Date Value Ref Range Status  MRSA, PCR 08/23/2023 NEGATIVE  NEGATIVE Final   Staphylococcus aureus 08/23/2023 POSITIVE (A)  NEGATIVE Final   Comment: (NOTE) The Xpert SA Assay (FDA approved for NASAL specimens in patients 53 years of age and older), is one component of a comprehensive surveillance program. It is not intended to diagnose infection nor to guide or monitor treatment. Performed at [facility], [address] [place], [place] [address]   Admission on 08/08/2023, Discharged on 08/09/2023  Component Date Value Ref Range Status   Lipase 08/08/2023 30  11 - 51 U/L Final   Performed at [facility], [address]  [place], [place] [address]   Sodium 08/08/2023 141  135 - 145 mmol/L Final   Potassium 08/08/2023 3.6  3.5 - 5.1 mmol/L Final   Chloride 08/08/2023 103  98 - 111 mmol/L Final   CO2 08/08/2023 26  22 - 32 mmol/L Final   Glucose, Bld 08/08/2023 108 (H)  70 - 99 mg/dL Final   Glucose reference range applies only to samples taken after fasting for at least 8 hours.   BUN 08/08/2023 29 (H)  8 - 23 mg/dL Final   Creatinine, Ser 08/08/2023 1.03  0.61 - 1.24 mg/dL Final   Calcium  08/08/2023 9.5  8.9 - 10.3 mg/dL Final   Total Protein 93/70/7974 7.4  6.5 - 8.1 g/dL Final   Albumin  08/08/2023 4.4  3.5 - 5.0 g/dL Final   AST 93/70/7974 21  15 - 41 U/L Final   ALT 08/08/2023 15  0 - 44 U/L Final   Alkaline Phosphatase 08/08/2023 78  38 - 126 U/L Final   Total Bilirubin 08/08/2023 0.6  0.0 - 1.2 mg/dL Final   GFR, Estimated 08/08/2023 >60  >60 mL/min Final   Comment: (NOTE) Calculated using the CKD-EPI Creatinine Equation (2021)    Anion gap 08/08/2023 12  5 - 15 Final   Performed at [facility], [address] [place], [place] [address]   WBC 08/08/2023 8.7  4.0 - 10.5 K/uL Final   RBC 08/08/2023 5.13  4.22 - 5.81 MIL/uL Final   Hemoglobin 08/08/2023 15.2  13.0 - 17.0 g/dL Final   HCT 93/70/7974 45.0  39.0 - 52.0 % Final   MCV 08/08/2023 87.7  80.0 - 100.0 fL Final   MCH 08/08/2023 29.6  26.0 - 34.0 pg Final   MCHC 08/08/2023 33.8  30.0 - 36.0 g/dL Final   RDW 93/70/7974 13.3  11.5 - 15.5 % Final   Platelets 08/08/2023 311  150 - 400 K/uL Final   nRBC 08/08/2023 0.0  0.0 - 0.2 % Final   Performed at [facility], [address] [place], [place] [address]   Color, Urine 08/09/2023 YELLOW (A)  YELLOW Final   APPearance 08/09/2023 CLEAR (A)  CLEAR Final   Specific Gravity, Urine 08/09/2023 1.039 (H)  1.005 - 1.030 Final   pH 08/09/2023 5.0  5.0 - 8.0 Final   Glucose, UA 08/09/2023 NEGATIVE  NEGATIVE mg/dL Final   Hgb urine dipstick 08/09/2023 NEGATIVE  NEGATIVE Final    Bilirubin Urine 08/09/2023 NEGATIVE  NEGATIVE Final   Ketones, ur 08/09/2023 NEGATIVE  NEGATIVE mg/dL Final   Protein, ur 93/69/7974 30 (A)  NEGATIVE mg/dL Final   Nitrite 93/69/7974 NEGATIVE  NEGATIVE Final   Leukocytes,Ua 08/09/2023 NEGATIVE  NEGATIVE Final   RBC / HPF 08/09/2023 0-5  0 - 5 RBC/hpf Final   WBC, UA 08/09/2023 0-5  0 - 5 WBC/hpf Final   Bacteria, UA 08/09/2023  NONE SEEN  NONE SEEN Final   Squamous Epithelial / HPF 08/09/2023 0  0 - 5 /HPF Final   Mucus 08/09/2023 PRESENT   Final   Hyaline Casts, UA 08/09/2023 PRESENT   Final   Performed at [facility], [address] [place] [address] [place], [place] [address]    ECG: Date: 08/08/2023  Time ECG obtained: 2205 PM Rate: 81 bpm Rhythm: normal sinus Axis (leads I and aVF): normal Intervals: PR 160 ms. QRS 86 ms. QTc 425 ms. ST segment and T wave changes: No evidence of acute T wave abnormalities or significant ST segment elevation or depression.  Evidence of a possible, age undetermined, prior infarct:  No Comparison: Similar to previous tracing obtained on 05/18/2023   IMAGING / PROCEDURES: CT ABDOMEN PELVIS W CONTRAST performed on 08/08/2023 No acute or active process within the abdomen or pelvis. Multiple chronic right-sided rib fractures. Aortic atherosclerosis.  CT HEAD AND CERVICAL SPINE WO CONTRAST performed on 06/04/2023 No evidence of an acute intracranial abnormality. Parenchymal atrophy, chronic small vessel ischemic disease and chronic lacunar infarcts within the left corona radiata/basal ganglia and at the left thalamocapsular junction. No evidence of an acute cervical spine fracture. Levocurvature of the cervical spine. Mild C2-C3 grade 1 anterolisthesis, unchanged from the prior cervical spine CT of 07/26/2021. Cervical spondylosis as described within the body of the report. Multilevel spinal canal narrowing. Most notably, at least moderate spinal canal stenosis is suspected C3-C4 and C4-C5. Multilevel  bony neural foraminal narrowing.   TRANSTHORACIC ECHOCARDIOGRAM performed on 05/28/2023 Left ventricular ejection fraction, by estimation, is 55 to 60%. The left ventricle has normal function. The left ventricle has no regional wall motion abnormalities. Left ventricular diastolic parameters are consistent with Grade I diastolic dysfunction (impaired relaxation).  Right ventricular systolic function is normal. The right ventricular size is normal.  The mitral valve is normal in structure. No evidence of mitral valve regurgitation.  The aortic valve is tricuspid. Aortic valve regurgitation is not visualized.  The inferior vena cava is normal in size with greater than 50% respiratory variability, suggesting right atrial pressure of 3 mmHg.   MR BRAIN WO CONTRAST performed on 05/27/2023 Acute perforator infarct in the left basal ganglia. Moderate chronic microvascular ischemic disease and remote left thalamocapsular infarct.   CT ANGIO HEAD NECK W WO CM performed on 05/27/2023 No emergent large vessel occlusion. Approximately 80% stenosis of the right ICA origin. Aortic atherosclerosis Emphysema  IMPRESSION AND PLAN: Jeremie Giangrande. has been referred for pre-anesthesia review and clearance prior to him undergoing the planned anesthetic and procedural courses. Available labs, pertinent testing, and imaging results were personally reviewed by me in preparation for upcoming operative/procedural course. [facility] medical record has been updated following extensive record review and patient interview with PAT staff.   This patient has been appropriately cleared by cardiology with an overall ACCEPTABLE (elevated but not prohibitive risk; see comments on clearance from cardiology above) of patient experiencing significant perioperative cardiovascular complications. Based on clinical review performed today (08/24/23), barring any significant acute changes in the patient's overall condition, it is  anticipated that he will be able to proceed with the planned surgical intervention. Any acute changes in clinical condition may necessitate his procedure being postponed and/or cancelled. Patient will meet with anesthesia team (MD and/or CRNA) on the day of his procedure for preoperative evaluation/assessment. Questions regarding anesthetic course will be fielded at that time.   Pre-surgical instructions were reviewed with the patient during his PAT appointment, and  questions were fielded to satisfaction by PAT clinical staff. He has been instructed on which medications that he will need to hold prior to surgery, as well as the ones that have been deemed safe/appropriate to take on the day of his procedure. As part of the general education provided by PAT, patient made aware both verbally and in writing, that he would need to abstain from the use of any illegal substances during his perioperative course. He was advised that failure to follow the provided instructions could necessitate case cancellation or result in serious perioperative complications up to and including death. Patient encouraged to contact PAT and/or his surgeon's office to discuss any questions or concerns that may arise prior to surgery; verbalized understanding.   Dorise Pereyra, MSN, APRN, FNP-C, CEN [facility]  Perioperative Services Nurse Practitioner Phone: [phone] Fax: [phone] 08/24/23 12:54 PM  NOTE: This note has been prepared using Dragon dictation software. Despite my best ability to proofread, there is always the potential that unintentional transcriptional errors may still occur from this process.

## 2023-08-26 ENCOUNTER — Encounter: Payer: Self-pay | Admitting: Vascular Surgery

## 2023-08-26 ENCOUNTER — Other Ambulatory Visit: Payer: Self-pay

## 2023-08-26 ENCOUNTER — Inpatient Hospital Stay: Payer: Self-pay | Admitting: Urgent Care

## 2023-08-26 ENCOUNTER — Encounter
Admission: RE | Discharge status: Against Medical Advice | Payer: Self-pay | Source: Ambulatory Visit | Attending: Vascular Surgery

## 2023-08-26 ENCOUNTER — Inpatient Hospital Stay
Admission: RE | Admit: 2023-08-26 | Discharge: 2023-08-27 | DRG: 039 | Discharge status: Against Medical Advice | Source: Ambulatory Visit | Attending: Vascular Surgery | Admitting: Vascular Surgery

## 2023-08-26 DIAGNOSIS — E785 Hyperlipidemia, unspecified: Secondary | ICD-10-CM | POA: Diagnosis present

## 2023-08-26 DIAGNOSIS — F4310 Post-traumatic stress disorder, unspecified: Secondary | ICD-10-CM | POA: Diagnosis present

## 2023-08-26 DIAGNOSIS — J439 Emphysema, unspecified: Secondary | ICD-10-CM | POA: Diagnosis present

## 2023-08-26 DIAGNOSIS — Z79899 Other long term (current) drug therapy: Secondary | ICD-10-CM | POA: Diagnosis not present

## 2023-08-26 DIAGNOSIS — Z886 Allergy status to analgesic agent status: Secondary | ICD-10-CM

## 2023-08-26 DIAGNOSIS — Z7982 Long term (current) use of aspirin: Secondary | ICD-10-CM

## 2023-08-26 DIAGNOSIS — I10 Essential (primary) hypertension: Secondary | ICD-10-CM | POA: Diagnosis present

## 2023-08-26 DIAGNOSIS — R7303 Prediabetes: Secondary | ICD-10-CM | POA: Diagnosis present

## 2023-08-26 DIAGNOSIS — Z801 Family history of malignant neoplasm of trachea, bronchus and lung: Secondary | ICD-10-CM | POA: Diagnosis not present

## 2023-08-26 DIAGNOSIS — I700 Atherosclerosis of aorta: Secondary | ICD-10-CM | POA: Diagnosis present

## 2023-08-26 DIAGNOSIS — Z8249 Family history of ischemic heart disease and other diseases of the circulatory system: Secondary | ICD-10-CM

## 2023-08-26 DIAGNOSIS — Z9151 Personal history of suicidal behavior: Secondary | ICD-10-CM | POA: Diagnosis not present

## 2023-08-26 DIAGNOSIS — Z7902 Long term (current) use of antithrombotics/antiplatelets: Secondary | ICD-10-CM | POA: Diagnosis not present

## 2023-08-26 DIAGNOSIS — Z8041 Family history of malignant neoplasm of ovary: Secondary | ICD-10-CM

## 2023-08-26 DIAGNOSIS — F17210 Nicotine dependence, cigarettes, uncomplicated: Secondary | ICD-10-CM | POA: Diagnosis present

## 2023-08-26 DIAGNOSIS — Z9889 Other specified postprocedural states: Secondary | ICD-10-CM | POA: Diagnosis not present

## 2023-08-26 DIAGNOSIS — Z833 Family history of diabetes mellitus: Secondary | ICD-10-CM

## 2023-08-26 DIAGNOSIS — Z888 Allergy status to other drugs, medicaments and biological substances status: Secondary | ICD-10-CM

## 2023-08-26 DIAGNOSIS — Z800 Family history of malignant neoplasm of digestive organs: Secondary | ICD-10-CM

## 2023-08-26 DIAGNOSIS — N400 Enlarged prostate without lower urinary tract symptoms: Secondary | ICD-10-CM | POA: Diagnosis present

## 2023-08-26 DIAGNOSIS — Z5329 Procedure and treatment not carried out because of patient's decision for other reasons: Secondary | ICD-10-CM | POA: Diagnosis present

## 2023-08-26 DIAGNOSIS — Z8673 Personal history of transient ischemic attack (TIA), and cerebral infarction without residual deficits: Secondary | ICD-10-CM

## 2023-08-26 DIAGNOSIS — Z91198 Patient's noncompliance with other medical treatment and regimen for other reason: Secondary | ICD-10-CM | POA: Diagnosis not present

## 2023-08-26 DIAGNOSIS — Z9181 History of falling: Secondary | ICD-10-CM | POA: Diagnosis not present

## 2023-08-26 DIAGNOSIS — I6521 Occlusion and stenosis of right carotid artery: Principal | ICD-10-CM

## 2023-08-26 HISTORY — DX: Other cerebrovascular disease: I67.89

## 2023-08-26 HISTORY — PX: ENDARTERECTOMY: SHX5162

## 2023-08-26 HISTORY — DX: Hyperlipidemia, unspecified: E78.5

## 2023-08-26 HISTORY — DX: Disorder of arteries and arterioles, unspecified: I77.9

## 2023-08-26 HISTORY — DX: Other ill-defined heart diseases: I51.89

## 2023-08-26 HISTORY — DX: Atherosclerosis of aorta: I70.0

## 2023-08-26 HISTORY — DX: Benign prostatic hyperplasia without lower urinary tract symptoms: N40.0

## 2023-08-26 HISTORY — DX: Emphysema, unspecified: J43.9

## 2023-08-26 LAB — MRSA NEXT GEN BY PCR, NASAL: MRSA by PCR Next Gen: NOT DETECTED

## 2023-08-26 LAB — GLUCOSE, CAPILLARY: Glucose-Capillary: 116 mg/dL — ABNORMAL HIGH (ref 70–99)

## 2023-08-26 SURGERY — ENDARTERECTOMY, CAROTID
Anesthesia: General | Laterality: Right

## 2023-08-26 MED ORDER — SODIUM CHLORIDE 0.9 % IV SOLN
INTRAVENOUS | Status: DC | PRN
Start: 2023-08-26 — End: 2023-08-26

## 2023-08-26 MED ORDER — ALBUTEROL SULFATE (2.5 MG/3ML) 0.083% IN NEBU: 3.0000 mL | INHALATION_SOLUTION | RESPIRATORY_TRACT | Status: DC | PRN

## 2023-08-26 MED ORDER — MAGNESIUM HYDROXIDE 400 MG/5ML PO SUSP: 30.0000 mL | Freq: Every day | ORAL | Status: DC | PRN

## 2023-08-26 MED ORDER — CEFAZOLIN SODIUM-DEXTROSE 2-4 GM/100ML-% IV SOLN
2.0000 g | Freq: Three times a day (TID) | INTRAVENOUS | Status: AC
  Administered 2023-08-26 (×2): 2 g via INTRAVENOUS
  Filled 2023-08-26 (×2): qty 100

## 2023-08-26 MED ORDER — ROCURONIUM BROMIDE 10 MG/ML (PF) SYRINGE
PREFILLED_SYRINGE | INTRAVENOUS | Status: AC
  Filled 2023-08-26: qty 10

## 2023-08-26 MED ORDER — ESMOLOL HCL-SODIUM CHLORIDE 2000 MG/100ML IV SOLN
25.0000 ug/kg/min | INTRAVENOUS | Status: DC
  Filled 2023-08-26: qty 100

## 2023-08-26 MED ORDER — LACTATED RINGERS IV SOLN: INTRAVENOUS | Status: DC

## 2023-08-26 MED ORDER — ONDANSETRON HCL 4 MG/2ML IJ SOLN
INTRAMUSCULAR | Status: DC | PRN
Start: 2023-08-26 — End: 2023-08-26
  Administered 2023-08-26: 4 mg via INTRAVENOUS

## 2023-08-26 MED ORDER — HYDROMORPHONE HCL 1 MG/ML IJ SOLN: 1.0000 mg | Freq: Once | INTRAMUSCULAR | Status: DC | PRN

## 2023-08-26 MED ORDER — ONDANSETRON HCL 4 MG/2ML IJ SOLN: 4.0000 mg | Freq: Four times a day (QID) | INTRAMUSCULAR | Status: DC | PRN

## 2023-08-26 MED ORDER — PHENYLEPHRINE 80 MCG/ML (10ML) SYRINGE FOR IV PUSH (FOR BLOOD PRESSURE SUPPORT)
PREFILLED_SYRINGE | INTRAVENOUS | Status: DC | PRN
  Administered 2023-08-26 (×4): 160 ug via INTRAVENOUS

## 2023-08-26 MED ORDER — SODIUM CHLORIDE 0.9 % IV SOLN: 500.0000 mL | Freq: Once | INTRAVENOUS | Status: DC | PRN

## 2023-08-26 MED ORDER — DEXAMETHASONE SODIUM PHOSPHATE 10 MG/ML IJ SOLN
INTRAMUSCULAR | Status: DC | PRN
  Administered 2023-08-26: 5 mg via INTRAVENOUS

## 2023-08-26 MED ORDER — MORPHINE SULFATE (PF) 2 MG/ML IV SOLN
2.0000 mg | INTRAVENOUS | Status: DC | PRN
  Administered 2023-08-26: 2 mg via INTRAVENOUS
  Administered 2023-08-27: 5 mg via INTRAVENOUS
  Filled 2023-08-26: qty 3
  Filled 2023-08-26: qty 2
  Filled 2023-08-26: qty 1

## 2023-08-26 MED ORDER — AMLODIPINE BESYLATE 5 MG PO TABS
10.0000 mg | ORAL_TABLET | Freq: Every day | ORAL | Status: DC
  Administered 2023-08-27: 10 mg via ORAL
  Filled 2023-08-26: qty 2

## 2023-08-26 MED ORDER — CEFAZOLIN SODIUM-DEXTROSE 2-4 GM/100ML-% IV SOLN
2.0000 g | INTRAVENOUS | Status: AC
  Administered 2023-08-26: 2 g via INTRAVENOUS

## 2023-08-26 MED ORDER — CHLORHEXIDINE GLUCONATE CLOTH 2 % EX PADS: 6.0000 | MEDICATED_PAD | Freq: Once | CUTANEOUS | Status: DC

## 2023-08-26 MED ORDER — CEFAZOLIN SODIUM-DEXTROSE 2-4 GM/100ML-% IV SOLN
INTRAVENOUS | Status: AC
  Filled 2023-08-26: qty 100

## 2023-08-26 MED ORDER — ACETAMINOPHEN 325 MG RE SUPP
325.0000 mg | RECTAL | Status: DC | PRN
  Filled 2023-08-26: qty 2

## 2023-08-26 MED ORDER — HYDROMORPHONE HCL 1 MG/ML IJ SOLN
0.5000 mg | INTRAMUSCULAR | Status: DC | PRN
  Administered 2023-08-26 (×2): 0.5 mg via INTRAVENOUS

## 2023-08-26 MED ORDER — DROPERIDOL 2.5 MG/ML IJ SOLN: 0.6250 mg | Freq: Once | INTRAMUSCULAR | Status: DC | PRN

## 2023-08-26 MED ORDER — NITROGLYCERIN IN D5W 200-5 MCG/ML-% IV SOLN
INTRAVENOUS | Status: AC
  Filled 2023-08-26: qty 250

## 2023-08-26 MED ORDER — PROPOFOL 1000 MG/100ML IV EMUL
INTRAVENOUS | Status: AC
  Filled 2023-08-26: qty 100

## 2023-08-26 MED ORDER — FENTANYL CITRATE (PF) 100 MCG/2ML IJ SOLN
INTRAMUSCULAR | Status: AC
Start: 2023-08-26 — End: 2023-08-26
  Filled 2023-08-26: qty 2

## 2023-08-26 MED ORDER — HYDRALAZINE HCL 20 MG/ML IJ SOLN
5.0000 mg | INTRAMUSCULAR | Status: DC | PRN
  Administered 2023-08-26: 5 mg via INTRAVENOUS
  Filled 2023-08-26: qty 0.25
  Filled 2023-08-26: qty 1

## 2023-08-26 MED ORDER — SUGAMMADEX SODIUM 200 MG/2ML IV SOLN
INTRAVENOUS | Status: DC | PRN
  Administered 2023-08-26: 200 mg via INTRAVENOUS

## 2023-08-26 MED ORDER — ACETAMINOPHEN 325 MG PO TABS: 325.0000 mg | ORAL_TABLET | ORAL | Status: DC | PRN

## 2023-08-26 MED ORDER — FENTANYL CITRATE (PF) 100 MCG/2ML IJ SOLN
INTRAMUSCULAR | Status: AC
  Filled 2023-08-26: qty 2

## 2023-08-26 MED ORDER — FENTANYL CITRATE (PF) 100 MCG/2ML IJ SOLN
INTRAMUSCULAR | Status: DC | PRN
  Administered 2023-08-26 (×2): 25 ug via INTRAVENOUS
  Administered 2023-08-26: 50 ug via INTRAVENOUS

## 2023-08-26 MED ORDER — DEXAMETHASONE SODIUM PHOSPHATE 10 MG/ML IJ SOLN
INTRAMUSCULAR | Status: AC
  Filled 2023-08-26: qty 1

## 2023-08-26 MED ORDER — EPHEDRINE SULFATE-NACL 50-0.9 MG/10ML-% IV SOSY
PREFILLED_SYRINGE | INTRAVENOUS | Status: DC | PRN
  Administered 2023-08-26: 10 mg via INTRAVENOUS

## 2023-08-26 MED ORDER — HEPARIN SODIUM (PORCINE) 1000 UNIT/ML IJ SOLN
INTRAMUSCULAR | Status: AC
  Filled 2023-08-26: qty 10

## 2023-08-26 MED ORDER — HEPARIN SODIUM (PORCINE) 5000 UNIT/ML IJ SOLN
INTRAMUSCULAR | Status: AC
  Filled 2023-08-26: qty 2

## 2023-08-26 MED ORDER — CHLORHEXIDINE GLUCONATE 0.12 % MT SOLN
OROMUCOSAL | Status: AC
  Filled 2023-08-26: qty 15

## 2023-08-26 MED ORDER — LIDOCAINE HCL (CARDIAC) PF 100 MG/5ML IV SOSY
PREFILLED_SYRINGE | INTRAVENOUS | Status: DC | PRN
  Administered 2023-08-26: 60 mg via INTRAVENOUS

## 2023-08-26 MED ORDER — LABETALOL HCL 5 MG/ML IV SOLN
10.0000 mg | INTRAVENOUS | Status: DC | PRN
  Filled 2023-08-26: qty 4

## 2023-08-26 MED ORDER — LIDOCAINE HCL 1 % IJ SOLN
INTRAMUSCULAR | Status: DC | PRN
  Administered 2023-08-26: 20 mL

## 2023-08-26 MED ORDER — MIDAZOLAM HCL 2 MG/2ML IJ SOLN
INTRAMUSCULAR | Status: DC | PRN
  Administered 2023-08-26: 2 mg via INTRAVENOUS

## 2023-08-26 MED ORDER — PROPOFOL 10 MG/ML IV BOLUS
INTRAVENOUS | Status: DC | PRN
  Administered 2023-08-26: 150 mg via INTRAVENOUS

## 2023-08-26 MED ORDER — HEPARIN SODIUM (PORCINE) 1000 UNIT/ML IJ SOLN
INTRAMUSCULAR | Status: DC | PRN
Start: 2023-08-26 — End: 2023-08-26
  Administered 2023-08-26: 6000 [IU] via INTRAVENOUS

## 2023-08-26 MED ORDER — DOCUSATE SODIUM 100 MG PO CAPS
100.0000 mg | ORAL_CAPSULE | Freq: Every day | ORAL | Status: DC
  Administered 2023-08-27: 100 mg via ORAL
  Filled 2023-08-26: qty 1

## 2023-08-26 MED ORDER — PHENOL 1.4 % MT LIQD
1.0000 | OROMUCOSAL | Status: DC | PRN
  Administered 2023-08-26: 1 via OROMUCOSAL
  Filled 2023-08-26 (×2): qty 177

## 2023-08-26 MED ORDER — MIDAZOLAM HCL 2 MG/2ML IJ SOLN
INTRAMUSCULAR | Status: AC
  Filled 2023-08-26: qty 2

## 2023-08-26 MED ORDER — PHENYLEPHRINE HCL-NACL 20-0.9 MG/250ML-% IV SOLN
INTRAVENOUS | Status: DC | PRN
  Administered 2023-08-26: 35 ug/min via INTRAVENOUS

## 2023-08-26 MED ORDER — CHLORHEXIDINE GLUCONATE CLOTH 2 % EX PADS
6.0000 | MEDICATED_PAD | Freq: Every day | CUTANEOUS | Status: DC
  Administered 2023-08-26 – 2023-08-27 (×2): 6 via TOPICAL

## 2023-08-26 MED ORDER — METOPROLOL TARTRATE 5 MG/5ML IV SOLN: 2.5000 mg | INTRAVENOUS | Status: DC | PRN

## 2023-08-26 MED ORDER — ATORVASTATIN CALCIUM 20 MG PO TABS
80.0000 mg | ORAL_TABLET | Freq: Every day | ORAL | Status: DC
Start: 2023-08-26 — End: 2023-08-27
  Administered 2023-08-26 – 2023-08-27 (×2): 80 mg via ORAL
  Filled 2023-08-26 (×2): qty 4

## 2023-08-26 MED ORDER — HYDROMORPHONE HCL 1 MG/ML IJ SOLN
INTRAMUSCULAR | Status: AC
  Filled 2023-08-26: qty 1

## 2023-08-26 MED ORDER — ONDANSETRON HCL 4 MG/2ML IJ SOLN
INTRAMUSCULAR | Status: AC
  Filled 2023-08-26: qty 2

## 2023-08-26 MED ORDER — PHENYLEPHRINE HCL-NACL 20-0.9 MG/250ML-% IV SOLN
INTRAVENOUS | Status: AC
  Filled 2023-08-26: qty 250

## 2023-08-26 MED ORDER — PROPOFOL 500 MG/50ML IV EMUL
INTRAVENOUS | Status: DC | PRN
  Administered 2023-08-26: 80 ug/kg/min via INTRAVENOUS

## 2023-08-26 MED ORDER — POTASSIUM CHLORIDE CRYS ER 20 MEQ PO TBCR: 40.0000 meq | EXTENDED_RELEASE_TABLET | Freq: Every day | ORAL | Status: DC | PRN

## 2023-08-26 MED ORDER — NITROGLYCERIN IN D5W 200-5 MCG/ML-% IV SOLN
5.0000 ug/min | INTRAVENOUS | Status: DC
  Administered 2023-08-26: 5 ug/min via INTRAVENOUS
  Administered 2023-08-27: 80 ug/min via INTRAVENOUS
  Filled 2023-08-26 (×3): qty 250

## 2023-08-26 MED ORDER — OXYCODONE-ACETAMINOPHEN 5-325 MG PO TABS
1.0000 | ORAL_TABLET | Freq: Four times a day (QID) | ORAL | Status: DC | PRN
  Administered 2023-08-26 (×2): 1 via ORAL
  Administered 2023-08-27: 2 via ORAL
  Filled 2023-08-26: qty 2
  Filled 2023-08-26 (×2): qty 1

## 2023-08-26 MED ORDER — CHLORHEXIDINE GLUCONATE 0.12 % MT SOLN
15.0000 mL | Freq: Once | OROMUCOSAL | Status: AC
  Administered 2023-08-26: 15 mL via OROMUCOSAL

## 2023-08-26 MED ORDER — ASPIRIN 81 MG PO TBEC
81.0000 mg | DELAYED_RELEASE_TABLET | Freq: Every day | ORAL | Status: DC
Start: 2023-08-26 — End: 2023-08-27
  Administered 2023-08-26 – 2023-08-27 (×2): 81 mg via ORAL
  Filled 2023-08-26 (×2): qty 1

## 2023-08-26 MED ORDER — FENTANYL CITRATE (PF) 100 MCG/2ML IJ SOLN
25.0000 ug | INTRAMUSCULAR | Status: DC | PRN
  Administered 2023-08-26: 50 ug via INTRAVENOUS
  Administered 2023-08-26 (×2): 25 ug via INTRAVENOUS

## 2023-08-26 MED ORDER — SODIUM CHLORIDE 0.9 % IV SOLN
INTRAVENOUS | Status: DC | PRN
  Administered 2023-08-26: 250 mL via INTRAMUSCULAR

## 2023-08-26 MED ORDER — HEMOSTATIC AGENTS (NO CHARGE) OPTIME
TOPICAL | Status: DC | PRN
  Administered 2023-08-26: 1 via TOPICAL

## 2023-08-26 MED ORDER — ORAL CARE MOUTH RINSE
15.0000 mL | Freq: Once | OROMUCOSAL | Status: AC
Start: 2023-08-26 — End: 2023-08-26

## 2023-08-26 MED ORDER — LACTATED RINGERS IV SOLN
INTRAVENOUS | Status: DC | PRN
Start: 2023-08-26 — End: 2023-08-26

## 2023-08-26 MED ORDER — LIDOCAINE HCL (PF) 1 % IJ SOLN
INTRAMUSCULAR | Status: AC
  Filled 2023-08-26: qty 30

## 2023-08-26 MED ORDER — HYDROCHLOROTHIAZIDE 25 MG PO TABS
25.0000 mg | ORAL_TABLET | Freq: Every day | ORAL | Status: DC
  Administered 2023-08-27: 25 mg via ORAL
  Filled 2023-08-26: qty 1

## 2023-08-26 MED ORDER — ROCURONIUM BROMIDE 100 MG/10ML IV SOLN
INTRAVENOUS | Status: DC | PRN
  Administered 2023-08-26 (×2): 50 mg via INTRAVENOUS

## 2023-08-26 MED ORDER — CLOPIDOGREL BISULFATE 75 MG PO TABS
75.0000 mg | ORAL_TABLET | Freq: Every day | ORAL | Status: DC
  Administered 2023-08-27: 75 mg via ORAL
  Filled 2023-08-26: qty 1

## 2023-08-26 SURGICAL SUPPLY — 47 items
BAG DECANTER FOR FLEXI CONT (MISCELLANEOUS) ×1 IMPLANT
BLADE SURG 15 STRL LF DISP TIS (BLADE) ×1 IMPLANT
BLADE SURG SZ11 CARB STEEL (BLADE) ×1 IMPLANT
BRUSH SCRUB EZ 4% CHG (MISCELLANEOUS) ×1 IMPLANT
CHLORAPREP W/TINT 26 (MISCELLANEOUS) ×1 IMPLANT
CLAMP SUTURE YELLOW 5 PAIRS (MISCELLANEOUS) ×1 IMPLANT
CLEANSER WND VASHE 34 (WOUND CARE) IMPLANT
DERMABOND ADVANCED .7 DNX12 (GAUZE/BANDAGES/DRESSINGS) IMPLANT
DRAPE INCISE IOBAN 66X45 STRL (DRAPES) ×1 IMPLANT
DRAPE LAPAROTOMY 77X122 PED (DRAPES) ×1 IMPLANT
DRAPE SHEET LG 3/4 BI-LAMINATE (DRAPES) ×1 IMPLANT
DRESSING SURGICEL FIBRLLR 1X2 (HEMOSTASIS) ×1 IMPLANT
ELECT CAUTERY BLADE 6.4 (BLADE) ×1 IMPLANT
ELECTRODE REM PT RTRN 9FT ADLT (ELECTROSURGICAL) ×1 IMPLANT
GLOVE BIO SURGEON STRL SZ7 (GLOVE) ×2 IMPLANT
GOWN STRL REUS W/ TWL LRG LVL3 (GOWN DISPOSABLE) ×3 IMPLANT
GOWN STRL REUS W/TWL 2XL LVL3 (GOWN DISPOSABLE) ×2 IMPLANT
HEMOSTAT HEMOBLAST BELLOWS (HEMOSTASIS) IMPLANT
IV NS 250ML BAXH (IV SOLUTION) ×1 IMPLANT
KIT TURNOVER KIT A (KITS) ×1 IMPLANT
LABEL OR SOLS (LABEL) ×1 IMPLANT
LOOP VESSEL MAXI 1X406 RED (MISCELLANEOUS) ×2 IMPLANT
LOOP VESSEL MINI 0.8X406 BLUE (MISCELLANEOUS) ×1 IMPLANT
MANIFOLD NEPTUNE II (INSTRUMENTS) ×1 IMPLANT
NDL FILTER BLUNT 18X1 1/2 (NEEDLE) ×1 IMPLANT
NDL HYPO 25X1 1.5 SAFETY (NEEDLE) ×1 IMPLANT
NEEDLE FILTER BLUNT 18X1 1/2 (NEEDLE) ×1 IMPLANT
NEEDLE HYPO 25X1 1.5 SAFETY (NEEDLE) ×1 IMPLANT
NS IRRIG 500ML POUR BTL (IV SOLUTION) ×1 IMPLANT
PACK BASIN MAJOR ARMC (MISCELLANEOUS) ×1 IMPLANT
PATCH VASC XENOSURE .8CM X 8CM (Vascular Products) IMPLANT
SET WALTER ACTIVATION W/DRAPE (SET/KITS/TRAYS/PACK) ×1 IMPLANT
SHUNT W TPORT 9FR PRUITT F3 (SHUNT) ×1 IMPLANT
SPONGE T-LAP 18X18 ~~LOC~~+RFID (SPONGE) ×2 IMPLANT
SUT PROLENE 6 0 BV (SUTURE) ×4 IMPLANT
SUT PROLENE 7 0 BV 1 (SUTURE) ×2 IMPLANT
SUT SILK 2-0 18XBRD TIE 12 (SUTURE) ×1 IMPLANT
SUT SILK 3-0 18XBRD TIE 12 (SUTURE) ×1 IMPLANT
SUT SILK 4-0 18XBRD TIE 12 (SUTURE) ×1 IMPLANT
SUT VIC AB 3-0 SH 27X BRD (SUTURE) ×2 IMPLANT
SUTURE MNCRL 4-0 27XMF (SUTURE) ×1 IMPLANT
SYR 10ML LL (SYRINGE) ×2 IMPLANT
SYR 20ML LL LF (SYRINGE) ×1 IMPLANT
TRAP FLUID SMOKE EVACUATOR (MISCELLANEOUS) ×1 IMPLANT
TRAY FOLEY MTR SLVR 16FR STAT (SET/KITS/TRAYS/PACK) ×1 IMPLANT
TUBING CONNECTING 10 (TUBING) IMPLANT
WATER STERILE IRR 500ML POUR (IV SOLUTION) ×1 IMPLANT

## 2023-08-26 NOTE — Anesthesia Procedure Notes (Signed)
 Procedure Name: Intubation Date/Time: 08/26/2023 7:52 AM  Performed by: Niki Manus SAUNDERS, CRNAPre-anesthesia Checklist: Patient identified, Patient being monitored, Timeout performed, Emergency Drugs available and Suction available Patient Re-evaluated:Patient Re-evaluated prior to induction Oxygen Delivery Method: Circle system utilized Preoxygenation: Pre-oxygenation with 100% oxygen Induction Type: IV induction Ventilation: Mask ventilation without difficulty Laryngoscope Size: McGrath and 4 Grade View: Grade I Tube type: Oral Tube size: 7.5 mm Number of attempts: 1 Airway Equipment and Method: Stylet Placement Confirmation: ETT inserted through vocal cords under direct vision, positive ETCO2 and breath sounds checked- equal and bilateral Secured at: 21 cm Tube secured with: Tape Dental Injury: Teeth and Oropharynx as per pre-operative assessment

## 2023-08-26 NOTE — Anesthesia Procedure Notes (Signed)
 Arterial Line Insertion Start/End7/17/2025 7:55 AM, 08/26/2023 8:00 AM Performed by: Dario Barter, MD, anesthesiologist  Patient location: OR. Preanesthetic checklist: patient identified, IV checked, site marked, risks and benefits discussed, surgical consent, monitors and equipment checked, pre-op evaluation, timeout performed and anesthesia consent Lidocaine  1% used for infiltration Left, radial was placed Catheter size: 20 G Hand hygiene performed  and maximum sterile barriers used   Attempts: 1 Procedure performed without using ultrasound guided technique. Following insertion, dressing applied and Biopatch. Post procedure assessment: normal and unchanged  Patient tolerated the procedure well with no immediate complications.

## 2023-08-26 NOTE — H&P (Signed)
 [facility] Admission History & Physical  MRN : [id number]  Alvin Gonzalez. is a 63 y.o. (September 20, 1960) male who presents with chief complaint of No chief complaint on file. SABRA  History of Present Illness: Patient with right grade right ICA stenosis here today for right CEA. No changes since last clinic visit. Has previous left hemispheric stroke.  Current Facility-Administered Medications  Medication Dose Route Frequency Provider Last Rate Last Admin   ceFAZolin  (ANCEF ) IVPB 2g/100 mL premix  2 g Intravenous On Call to OR Brown, Fallon E, NP       Chlorhexidine  Gluconate Cloth 2 % PADS 6 each  6 each Topical Once Brown, Fallon E, NP       And   Chlorhexidine  Gluconate Cloth 2 % PADS 6 each  6 each Topical Once Brown, Fallon E, NP       lactated ringers  infusion   Intravenous Continuous Myra Lynwood MATSU, MD 10 mL/hr at 08/26/23 0716 New Bag at 08/26/23 0716    Past Medical History:  Diagnosis Date   Acute hypoxemic respiratory failure (HCC) 07/27/2021   Adrenal insufficiency (HCC)    Anxiety    Aortic atherosclerosis (HCC)    BPH (benign prostatic hyperplasia)    Cancer (HCC)    Cerebral microvascular disease    Cerebrovascular accident (CVA) of left basal ganglia (HCC) 05/27/2023   a.) Brain MRI 05/27/2023: acute perforator infarct in the LEFT basal ganglia   Cervical disc disease with myelopathy 07/29/2021   Chronic back pain    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, with delusions and tactile/visual hallucinations 04/20/2022   COPD (chronic obstructive pulmonary disease) (HCC) 05/27/2023   Depression    Diastolic dysfunction    Emphysema of lung (HCC)    Erectile dysfunction 03/26/2017   GERD (gastroesophageal reflux disease)    Hemorrhoids    HLD (hyperlipidemia)    Hypertension    Intermittent explosive disorder    Left sided lacunar infarction (HCC) 05/18/2023   a.) CT head  05/18/2023: acute LEFT thalamocapsular lacunar infarction   Polysubstance abuse (HCC)    a.) cocaine + THC + opioids + tobacco   Prediabetes 02/20/2021   PTSD (post-traumatic stress disorder)    Pulmonary nodule 12/10/2016   Right-sided carotid artery disease (HCC)    a.) CTA head/neck 05/27/2023: 80% origin RICA   Risk for falls    Stenosis of right carotid artery 05/28/2023   Suicide attempt (HCC)    prior to 02/18/14 admission     Past Surgical History:  Procedure Laterality Date   CHEST TUBE INSERTION     COLONOSCOPY WITH PROPOFOL  N/A 05/14/2021   Procedure: COLONOSCOPY WITH PROPOFOL ;  Surgeon: Therisa Bi, MD;  Location: [facility] ENDOSCOPY;  Service: Gastroenterology;  Laterality: N/A;   EVALUATION UNDER ANESTHESIA WITH HEMORRHOIDECTOMY N/A 09/10/2021   Procedure: EXAM UNDER ANESTHESIA WITH HEMORRHOIDECTOMY of 2+ columns;  Surgeon: Desiderio Schanz, MD;  Location: [facility] ORS;  Service: General;  Laterality: N/A;  Provider requesting 1.5 hours / 90 minutes for procedure.   LYMPH NODE BIOPSY       Social History   Tobacco Use   Smoking status: Every Day    Current packs/day: 1.00    Average packs/day: 1 pack/day for 45.0 years (45.0 ttl pk-yrs)    Types: Cigarettes   Smokeless tobacco: Former    Quit date: 01/30/2015  Vaping Use   Vaping status: Former   Quit date: 01/23/2017  Substance  Use Topics   Alcohol use: Yes    Comment: occ   Drug use: Yes    Types: Marijuana, Cocaine    Comment: special occasions , last use 05/19/21. uses cocaine, last use 04/17/22     Family History  Problem Relation Age of Onset   Diabetes Mother    Hypertension Mother    Other Mother        possible ovarian cancer   Heart disease Father    Hypertension Father    Cirrhosis Father    Liver cancer Father    Lung cancer Sister    Heart attack Maternal Grandmother    Suicidality Maternal Grandfather    Other Paternal Grandmother        unknown medical history   Suicidality Paternal Grandfather     Stomach cancer Cousin     Allergies  Allergen Reactions   Carbamazepine Rash and Hives   Ibuprofen Nausea And Vomiting, Rash and Other (See Comments)    GI Bleeding    Risperidone  Swelling    Tongue swelling   Naproxen  Other (See Comments)    GI bleeding     REVIEW OF SYSTEMS (Negative unless checked)   Constitutional: [] Weight loss  [] Fever  [] Chills Cardiac: [] Chest pain   [] Chest pressure   [] Palpitations   [] Shortness of breath when laying flat   [] Shortness of breath at rest   [x] Shortness of breath with exertion. Vascular:  [] Pain in legs with walking   [] Pain in legs at rest   [] Pain in legs when laying flat   [] Claudication   [] Pain in feet when walking  [] Pain in feet at rest  [] Pain in feet when laying flat   [] History of DVT   [] Phlebitis   [] Swelling in legs   [] Varicose veins   [] Non-healing ulcers Pulmonary:   [] Uses home oxygen   [] Productive cough   [] Hemoptysis   [] Wheeze  [x] COPD   [] Asthma Neurologic:  [] Dizziness  [] Blackouts   [] Seizures   [x] History of stroke   [] History of TIA  [] Aphasia   [] Temporary blindness   [] Dysphagia   [] Weakness or numbness in arms   [] Weakness or numbness in legs Musculoskeletal:  [] Arthritis   [] Joint swelling   [] Joint pain   [] Low back pain Hematologic:  [] Easy bruising  [] Easy bleeding   [] Hypercoagulable state   [] Anemic  [] Hepatitis Gastrointestinal:  [] Blood in stool   [] Vomiting blood  [x] Gastroesophageal reflux/heartburn   [] Abdominal pain Genitourinary:  [] Chronic kidney disease   [] Difficult urination  [] Frequent urination  [] Burning with urination   [] Hematuria Skin:  [] Rashes   [] Ulcers   [] Wounds Psychological:  [x] History of anxiety   [x]  History of major depression.  Physical Examination  Vitals:   08/26/23 0649  BP: (!) 181/98  Pulse: 68  Resp: 18  Temp: 98.9 F (37.2 C)  TempSrc: Temporal  SpO2: 98%  Weight: 57.6 kg  Height: 5' 5 (1.651 m)   Body mass index is 21.12 kg/m. Gen: WD/WN, NAD Head:  [place]/AT, No temporalis wasting.  Ear/Nose/Throat: Hearing grossly intact, nares w/o erythema or drainage, oropharynx w/o Erythema/Exudate,  Eyes: Conjunctiva clear, sclera non-icteric Neck: Trachea midline.  No JVD.  Pulmonary:  Good air movement, respirations not labored, no use of accessory muscles.  Cardiac: RRR, normal S1, S2. Vascular:  Vessel Right Left  Radial Palpable Palpable           Musculoskeletal: M/S 5/5 throughout.  Extremities without ischemic changes.  No deformity or atrophy.  Neurologic: Sensation grossly  intact in extremities.  Symmetrical.  Speech is fluent. Motor exam as listed above. Psychiatric: Judgment intact, Mood & affect appropriate for pt's clinical situation. Dermatologic: No rashes or ulcers noted.  No cellulitis or open wounds.      CBC Lab Results  Component Value Date   WBC 8.7 08/08/2023   HGB 15.2 08/08/2023   HCT 45.0 08/08/2023   MCV 87.7 08/08/2023   PLT 311 08/08/2023    BMET    Component Value Date/Time   NA 141 08/08/2023 2202   NA 144 01/23/2021 1918   NA 144 02/18/2014 0914   K 3.6 08/08/2023 2202   K 3.0 (L) 02/18/2014 0914   CL 103 08/08/2023 2202   CL 106 02/18/2014 0914   CO2 26 08/08/2023 2202   CO2 28 02/18/2014 0914   GLUCOSE 108 (H) 08/08/2023 2202   GLUCOSE 137 (H) 02/18/2014 0914   BUN 29 (H) 08/08/2023 2202   BUN 13 01/23/2021 1918   BUN 15 02/18/2014 0914   CREATININE 1.03 08/08/2023 2202   CREATININE 2.18 (H) 02/18/2014 0914   CALCIUM  9.5 08/08/2023 2202   CALCIUM  8.9 02/18/2014 0914   GFRNONAA >60 08/08/2023 2202   GFRNONAA 34 (L) 02/18/2014 0914   GFRNONAA >60 07/16/2011 2028   GFRAA >60 11/01/2016 1421   GFRAA 41 (L) 02/18/2014 0914   GFRAA >60 07/16/2011 2028   Estimated Creatinine Clearance: 61.4 mL/min (by C-G formula based on SCr of 1.03 mg/dL).  COAG Lab Results  Component Value Date   INR 1.0 05/28/2023   INR 0.9 05/18/2023   INR 0.91 01/30/2015    Radiology CT ABDOMEN PELVIS W  CONTRAST Result Date: 08/09/2023 CLINICAL DATA:  Abdominal pain with vomiting and diarrhea. EXAM: CT ABDOMEN AND PELVIS WITH CONTRAST TECHNIQUE: Multidetector CT imaging of the abdomen and pelvis was performed using the standard protocol following bolus administration of intravenous contrast. RADIATION DOSE REDUCTION: This exam was performed according to the departmental dose-optimization program which includes automated exposure control, adjustment of the mA and/or kV according to patient size and/or use of iterative reconstruction technique. CONTRAST:  OMNIPAQUE  IOHEXOL  300 MG/ML  SOLN COMPARISON:  May 27, 2023 FINDINGS: Lower chest: No acute abnormality. Hepatobiliary: No focal liver abnormality is seen. No gallstones, gallbladder wall thickening, or biliary dilatation. Pancreas: Unremarkable. No pancreatic ductal dilatation or surrounding inflammatory changes. Spleen: Normal in size without focal abnormality. Adrenals/Urinary Tract: Adrenal glands are unremarkable. Kidneys are normal, without renal calculi, focal lesion, or hydronephrosis. Bladder is unremarkable. Stomach/Bowel: Stomach is within normal limits. Appendix appears normal. No evidence of bowel wall thickening, distention, or inflammatory changes. Vascular/Lymphatic: Aortic atherosclerosis. No enlarged abdominal or pelvic lymph nodes. Reproductive: The prostate gland is mildly enlarged. Other: No abdominal wall hernia or abnormality. No abdominopelvic ascites. Musculoskeletal: Multiple chronic right-sided rib fractures are seen. Multilevel degenerative changes are noted throughout the lumbar spine. IMPRESSION: 1. No acute or active process within the abdomen or pelvis. 2. Multiple chronic right-sided rib fractures. 3. Aortic atherosclerosis. Electronically Signed   By: Suzen Dials M.D.   On: 08/09/2023 00:10     Assessment/Plan Stroke (HCC) Small, left-sided, and not related to his carotid disease.  Seems to have recovered  largely from this.   HTN (hypertension) blood pressure control important in reducing the progression of atherosclerotic disease. On appropriate oral medications.     Stenosis of right carotid artery The patient has an 80% or greater right internal carotid artery stenosis at its origin.  I have independently reviewed  his CT angiogram.  This is extensively calcified and a suboptimal candidate for stent placement.  I have recommended right carotid endarterectomy for stroke risk reduction.  It was again stressed that this did not have anything to do with his left-sided stroke from last month.  It was discussed that we were doing this only for stroke risk reduction long-term.  He is on aspirin  and a statin agent and should continue these.  I have discussed the risks and benefits of the procedure.  I discussed the 2 to 3% risk of stroke, myocardial infarction, bleeding, infection, and cranial nerve injury.  I have discussed that we essentially perform the surgery and trade upfront risk for long-term benefit.  He voices his understanding and desires to proceed.   [name], MD  08/26/2023 7:19 AM

## 2023-08-26 NOTE — Transfer of Care (Signed)
 Immediate Anesthesia Transfer of Care Note  Patient: Alvin Gonzalez.  Procedure(s) Performed: ENDARTERECTOMY, CAROTID (Right)  Patient Location: PACU  Anesthesia Type:General  Level of Consciousness: awake and alert   Airway & Oxygen Therapy: Patient Spontanous Breathing and Patient connected to nasal cannula oxygen  Post-op Assessment: Report given to RN and Post -op Vital signs reviewed and stable  Post vital signs: Reviewed and stable  Last Vitals:  Vitals Value Taken Time  BP 148/97 08/26/23 10:00  Temp 36.1 C 08/26/23 09:52  Pulse 69 08/26/23 10:01  Resp 13 08/26/23 10:01  SpO2 98 % 08/26/23 10:01  Vitals shown include unfiled device data.  Last Pain:  Vitals:   08/26/23 0952  TempSrc:   PainSc: Asleep         Complications: No notable events documented.

## 2023-08-26 NOTE — Progress Notes (Signed)
 Notified MD of slight L facial droop as well as small amount of dysarthria, NIH of 2, Per MD,-baseline from previous CVA, will continue to monitor

## 2023-08-26 NOTE — Anesthesia Preprocedure Evaluation (Signed)
 Anesthesia Evaluation  Patient identified by MRN, date of birth, ID band Patient awake    Reviewed: Allergy & Precautions, NPO status , Patient's Chart, lab work & pertinent test results  History of Anesthesia Complications Negative for: history of anesthetic complications  Airway Mallampati: III  TM Distance: >3 FB Neck ROM: full    Dental  (+) Dental Advidsory Given, Missing, Poor Dentition   Pulmonary neg pulmonary ROS, neg shortness of breath, neg sleep apnea, neg COPD, neg recent URI, Current Smoker   Pulmonary exam normal        Cardiovascular hypertension, (-) angina (-) CAD, (-) Past MI and (-) CABG negative cardio ROS Normal cardiovascular exam(-) dysrhythmias (-) Valvular Problems/Murmurs     Neuro/Psych neg Seizures PSYCHIATRIC DISORDERS Anxiety Depression    CVA    GI/Hepatic Neg liver ROS,GERD  ,,  Endo/Other  negative endocrine ROS    Renal/GU      Musculoskeletal   Abdominal   Peds  Hematology negative hematology ROS (+)   Anesthesia Other Findings Past Medical History: No date: Adrenal insufficiency (HCC) No date: AKI (acute kidney injury) (HCC) No date: Anxiety No date: Cancer (HCC) No date: Chronic back pain No date: Chronic pain No date: Chronic prescription opiate use No date: Depression No date: Depression No date: GERD (gastroesophageal reflux disease) No date: Hemorrhoids No date: Hypertension No date: Hypokalemia No date: Intermittent explosive disorder No date: Marijuana use No date: PTSD (post-traumatic stress disorder) No date: Pulmonary nodule No date: Risk for falls No date: Suicide attempt [facility])     Comment:  prior to 02/18/14 admission  No date: Tobacco abuse  Past Surgical History: No date: CHEST TUBE INSERTION 05/14/2021: COLONOSCOPY WITH PROPOFOL ; N/A     Comment:  Procedure: COLONOSCOPY WITH PROPOFOL ;  Surgeon: Therisa Bi, MD;  Location: [facility]  ENDOSCOPY;  Service:               Gastroenterology;  Laterality: N/A; No date: LYMPH NODE BIOPSY  BMI    Body Mass Index: 24.46 kg/m      Reproductive/Obstetrics negative OB ROS                              Anesthesia Physical Anesthesia Plan  ASA: 2  Anesthesia Plan: General   Post-op Pain Management:    Induction: Intravenous  PONV Risk Score and Plan: 2 and Dexamethasone , Ondansetron , Midazolam  and Treatment may vary due to age or medical condition  Airway Management Planned: Oral ETT  Additional Equipment:   Intra-op Plan:   Post-operative Plan: Extubation in OR  Informed Consent: I have reviewed the patients History and Physical, chart, labs and discussed the procedure including the risks, benefits and alternatives for the proposed anesthesia with the patient or authorized representative who has indicated his/her understanding and acceptance.     Dental Advisory Given  Plan Discussed with: Anesthesiologist, CRNA and Surgeon  Anesthesia Plan Comments: (Patient consented for risks of anesthesia including but not limited to:  - adverse reactions to medications - damage to eyes, teeth, lips or other oral mucosa - nerve damage due to positioning  - sore throat or hoarseness - Damage to heart, brain, nerves, lungs, other parts of body or loss of life  Patient voiced understanding.)         Anesthesia Quick Evaluation

## 2023-08-26 NOTE — Op Note (Signed)
 [facility]   OPERATIVE NOTE  PROCEDURE:   1.  Right carotid endarterectomy with bovine pericardial patch reconstruction  PRE-OPERATIVE DIAGNOSIS: 1.  High grade right carotid stenosis 2. Previous left hemispheric stroke  POST-OPERATIVE DIAGNOSIS: same as above   SURGEON: [name], MD  ASSISTANT(S): none  ANESTHESIA: general  ESTIMATED BLOOD LOSS: 25 cc  FINDING(S): 1.  right carotid plaque.  SPECIMEN(S):  Carotid plaque (sent to Pathology)  INDICATIONS:   Alvin Gonzalez. is a 63 y.o. male who presents with high-grade right carotid stenosis of greater than 75%.  He has had a previous left hemispheric stroke and has completely recovered from that.  His lesion was highly calcified was more suitable for carotid endarterectomy than carotid stenting.  I discussed with the patient the risks, benefits, and alternatives to carotid endarterectomy.  I discussed the differences between carotid stenting and carotid endarterectomy. I discussed the procedural details of carotid endarterectomy with the patient.  The patient is aware that the risks of carotid endarterectomy include but are not limited to: bleeding, infection, stroke, myocardial infarction, death, cranial nerve injuries both temporary and permanent, neck hematoma, possible airway compromise, labile blood pressure post-operatively, cerebral hyperperfusion syndrome, and possible need for additional interventions in the future. The patient is aware of the risks and agrees to proceed forward with the procedure.  DESCRIPTION: After full informed written consent was obtained from the patient, the patient was brought back to the operating room and placed supine upon the operating table.  Prior to induction, the patient received IV antibiotics.  After obtaining adequate anesthesia, the patient was placed into a modified beach chair position with a shoulder roll in place and the patient's neck slightly hyperextended  and rotated away from the surgical site.  The patient was prepped in the standard fashion for a carotid endarterectomy.  I made an incision anterior to the sternocleidomastoid muscle and dissected down through the subcutaneous tissue.  The platysmas was opened with electrocautery.  Then I dissected down to the internal jugular vein and facial vein.  The facial vein is ligated and divided between 2-0 silk ties.  This was dissected posteriorly until I obtained visualization of the common carotid artery.  This was dissected out and then a vessel loop was placed around the common carotid artery.  I then dissected in a periadventitial fashion along the common carotid artery up to the bifurcation.  I then identified the external carotid artery and the superior thyroid artery.  I placed a vessel loop around the superior thyroid artery, and I also dissected out the external carotid artery and placed a vessel loop around it. In the process of this dissection, the hypoglossal nerve was identified and protected from harm.  I then dissected out the internal carotid artery until I identified an area in the internal carotid artery clearly above the stenosis.  I dissected slightly distal to this area, and placed a vessel loop around the artery.  At this point, we gave the patient 6000 units of intravenous heparin .  After this was allowed to circulate for several minutes, I pulled up control on the vessel loops to clamp the internal carotid artery, external carotid artery, superior thyroid artery, and then the common carotid artery.  I then made an arteriotomy in the common carotid artery with a 11 blade, and extended the arteriotomy with a Potts scissor down into the common carotid artery, then I carried the arteriotomy through the bifurcation into the internal carotid artery  until I reached an area that was not diseased.  At this point, I took the [place] shunt that previously been prepared and I inserted it into the  internal carotid artery first, and then into the common carotid artery taking care to flush and de-air prior to release of control. At this point, I started the endarterectomy in the common carotid artery with a [place] elevator and carried this dissection down into the common carotid artery circumferentially.  Then I transected the plaque at a segment where it was adherent and transected the plaque with Potts scissors.  I then carried this dissection up into the external carotid artery.  The plaque was extracted by unclamping the external carotid artery and performing an eversion endarterectomy.  The dissection was then carried into the internal carotid artery where a nice feathered end point was created with gentle traction.  I passed the plaque off the field as a specimen. At this point I removed all loose flecks and remaining disease possible.  At this point, I was satisfied that the minimal remaining disease was densely adherent to the wall and wall integrity was intact. The distal endpoint was tacked down with two 7-0 Prolene sutures.  I then fashioned a bovine pericardial patch for the artery and sewed it in place with two running stitch of 6-0 Prolene.  I started at the distal endpoint and ran one half the length of the arteriotomy.  I then cut and beveled the patch to an appropriate length to match the arteriotomy.  I started the second 6-0 Prolene at the proximal end point.  The medial suture line was completed and the lateral suture line was run approximately one quarter the length of the arteriotomy.  Prior to completing this patch angioplasty, I removed the shunt first from the internal carotid artery, from which there was excellent backbleeding, and clamped it.  Then I removed the shunt from the common carotid artery, from which there was excellent antegrade bleeding, and then clamped it.  At this point, I allowed the external carotid artery to backbleed, which was excellent.  Then I instilled  heparinized saline in this patched artery and then completed the patch angioplasty in the usual fashion.  First, I released the clamp on the external carotid artery, then I released it on the common carotid artery.  After waiting a few seconds, I then released it on the internal carotid artery. Several minutes of pressure were held and 6-0 Prolene patch sutures were used as need for hemostasis.  At this point, I placed Fibrillar and Hemoblast topical hemostatic agents.  There was no more active bleeding in the surgical site.  The sternocleidomastoid space was closed with three interrupted 3-0 Vicryl sutures. I then reapproximated the platysma muscle with a running stitch of 3-0 Vicryl.  The skin was then closed with a running subcuticular 4-0 Monocryl.  The skin was then cleaned, dried and Dermabond was used to reinforce the skin closure.  The patient awakened and was taken to the recovery room in stable condition, following commands and moving all four extremities without any apparent deficits.    COMPLICATIONS: none  CONDITION: stable  [name]  08/26/2023, 10:01 AM    This note was created with Dragon Medical transcription system. Any errors in dictation are purely unintentional.

## 2023-08-26 NOTE — Progress Notes (Signed)
 Pts bp continued to climb throughout shift, as well as the swelling at his incision site. Notified MD who advised to keep pts head elevated and try to keep bp on the lower side. Eventually had to start nitroglycerin  gtt after prn hydralazine  and pain med didn't work. Unable to use 1st line of treatment for bp prn due to hr in 50's and 60's. Pt denies difficulty swallowing or breathing. Will continue to monitor

## 2023-08-26 NOTE — Plan of Care (Signed)

## 2023-08-27 ENCOUNTER — Encounter: Payer: Self-pay | Admitting: Vascular Surgery

## 2023-08-27 DIAGNOSIS — Z91198 Patient's noncompliance with other medical treatment and regimen for other reason: Secondary | ICD-10-CM | POA: Diagnosis not present

## 2023-08-27 DIAGNOSIS — Z9889 Other specified postprocedural states: Secondary | ICD-10-CM | POA: Diagnosis not present

## 2023-08-27 DIAGNOSIS — I10 Essential (primary) hypertension: Secondary | ICD-10-CM

## 2023-08-27 DIAGNOSIS — Z79899 Other long term (current) drug therapy: Secondary | ICD-10-CM | POA: Diagnosis not present

## 2023-08-27 LAB — CBC
HCT: 34.6 % — ABNORMAL LOW (ref 39.0–52.0)
Hemoglobin: 11.6 g/dL — ABNORMAL LOW (ref 13.0–17.0)
MCH: 29.4 pg (ref 26.0–34.0)
MCHC: 33.5 g/dL (ref 30.0–36.0)
MCV: 87.8 fL (ref 80.0–100.0)
Platelets: 260 K/uL (ref 150–400)
RBC: 3.94 MIL/uL — ABNORMAL LOW (ref 4.22–5.81)
RDW: 13.4 % (ref 11.5–15.5)
WBC: 9.5 K/uL (ref 4.0–10.5)
nRBC: 0 % (ref 0.0–0.2)

## 2023-08-27 LAB — BASIC METABOLIC PANEL WITH GFR
Anion gap: 8 (ref 5–15)
BUN: 13 mg/dL (ref 8–23)
CO2: 27 mmol/L (ref 22–32)
Calcium: 8.6 mg/dL — ABNORMAL LOW (ref 8.9–10.3)
Chloride: 100 mmol/L (ref 98–111)
Creatinine, Ser: 0.83 mg/dL (ref 0.61–1.24)
GFR, Estimated: >60 mL/min (ref >60)
Glucose, Bld: 112 mg/dL — ABNORMAL HIGH (ref 70–99)
Potassium: 3.1 mmol/L — ABNORMAL LOW (ref 3.5–5.1)
Sodium: 135 mmol/L (ref 135–145)

## 2023-08-27 LAB — SURGICAL PATHOLOGY

## 2023-08-27 MED ORDER — POTASSIUM CHLORIDE CRYS ER 20 MEQ PO TBCR: 60.0000 meq | EXTENDED_RELEASE_TABLET | Freq: Once | ORAL | Status: DC

## 2023-08-27 MED ORDER — POTASSIUM CHLORIDE 20 MEQ PO PACK
60.0000 meq | PACK | Freq: Once | ORAL | Status: AC
  Administered 2023-08-27: 60 meq via ORAL
  Filled 2023-08-27: qty 3

## 2023-08-27 NOTE — Anesthesia Postprocedure Evaluation (Signed)
 Anesthesia Post Note  Patient: Alvin Earl Ungaro Jr.  Procedure(s) Performed: ENDARTERECTOMY, CAROTID (Right)  Patient location during evaluation: ICU Anesthesia Type: General Level of consciousness: awake and alert Pain management: pain level controlled Vital Signs Assessment: post-procedure vital signs reviewed and stable Respiratory status: spontaneous breathing Cardiovascular status: stable Postop Assessment: no apparent nausea or vomiting Anesthetic complications: no   No notable events documented.   Last Vitals:  Vitals:   08/27/23 0630 08/27/23 0701  BP: 115/65 128/74  Pulse: 83 82  Resp: 11 19  Temp:    SpO2: 93% 95%    Last Pain:  Vitals:   08/27/23 0531  TempSrc:   PainSc: 5                  Andrez Lieurance [address]

## 2023-08-27 NOTE — Progress Notes (Signed)
 Progress Note    08/27/2023 8:38 AM 1 Day Post-Op  Subjective:  Alvin Gonzalez is a 63 yo male now POD #1 from:  PROCEDURE:   1.  Right carotid endarterectomy with bovine pericardial patch reconstruction  Patient is resting comfortably in ICU this morning.  Patient was on nitroglycerin  infusion over due to elevated blood pressures.  This morning he continues on that infusion.  Right neck incision is clean dry and intact sutured together and covered with Dermabond to closure.  No dehiscence noted.  Patient's neck is tight with edema and without erythema.  Patient endorses a sore throat but not difficulty swallowing.  The pressure on his throat.  He denies any breathing difficulties.  Oxygen saturation this morning on room air was 98%.  Patient denies any headache, dizziness, blurred vision or any neurologic deficits.  Patient is 5 out of 5 strength upper extremities as well as lower extremities this morning.  No complications overnight vitals are remained stable.   Vitals:   08/27/23 0701 08/27/23 0800  BP: 128/74 134/65  Pulse: 82 66  Resp: 19 10  Temp:  98.1 F (36.7 C)  SpO2: 95% 93%   Physical Exam: Cardiac:  RRR with tachycardia in 100's. No murmurs to appreciate.  Lungs:  Clear on auscultation but diminished in the bases. No rales Rhonchi or wheezing noted.  Incisions:  Right neck incision. Intact with dermabond. +2 edema to the incision. No erythema or dehiscence noted.  Extremities:  Palpable pulses throughout, all extremities are warm to the touch.   Abdomen:  Positive bowel sounds throughout, soft non tender and non distended.  Neurologic: AAOX4.   CBC    Component Value Date/Time   WBC 9.5 08/27/2023 0548   RBC 3.94 (L) 08/27/2023 0548   HGB 11.6 (L) 08/27/2023 0548   HGB 14.1 02/18/2014 0914   HCT 34.6 (L) 08/27/2023 0548   HCT 41.9 02/18/2014 0914   PLT 260 08/27/2023 0548   PLT 272 02/18/2014 0914   MCV 87.8 08/27/2023 0548   MCV 86 02/18/2014 0914   MCH 29.4  08/27/2023 0548   MCHC 33.5 08/27/2023 0548   RDW 13.4 08/27/2023 0548   RDW 13.8 02/18/2014 0914   LYMPHSABS 0.4 (L) 05/27/2023 1716   LYMPHSABS 0.7 (L) 02/18/2014 0914   MONOABS 0.3 05/27/2023 1716   MONOABS 0.7 02/18/2014 0914   EOSABS 0.1 05/27/2023 1716   EOSABS 0.0 02/18/2014 0914   BASOSABS 0.0 05/27/2023 1716   BASOSABS 0.0 02/18/2014 0914    BMET    Component Value Date/Time   NA 135 08/27/2023 0548   NA 144 01/23/2021 1918   NA 144 02/18/2014 0914   K 3.1 (L) 08/27/2023 0548   K 3.0 (L) 02/18/2014 0914   CL 100 08/27/2023 0548   CL 106 02/18/2014 0914   CO2 27 08/27/2023 0548   CO2 28 02/18/2014 0914   GLUCOSE 112 (H) 08/27/2023 0548   GLUCOSE 137 (H) 02/18/2014 0914   BUN 13 08/27/2023 0548   BUN 13 01/23/2021 1918   BUN 15 02/18/2014 0914   CREATININE 0.83 08/27/2023 0548   CREATININE 2.18 (H) 02/18/2014 0914   CALCIUM  8.6 (L) 08/27/2023 0548   CALCIUM  8.9 02/18/2014 0914   GFRNONAA >60 08/27/2023 0548   GFRNONAA 34 (L) 02/18/2014 0914   GFRNONAA >60 07/16/2011 2028   GFRAA >60 11/01/2016 1421   GFRAA 41 (L) 02/18/2014 0914   GFRAA >60 07/16/2011 2028    INR    Component Value Date/Time  INR 1.0 05/28/2023 0313     Intake/Output Summary (Last 24 hours) at 08/27/2023 0838 Last data filed at 08/27/2023 0800 Gross per 24 hour  Intake 2022.85 ml  Output 1795 ml  Net 227.85 ml     Assessment/Plan:  63 y.o. male is s/p See ABOVE 1 Day Post-Op   PLAN Continue to control patients Blood Pressure while weaning Nitroglycerin  infusion. Plan to continue oral antihypertensive medications.  Pain medication Prn.  OOB to the chair with assistance.    DVT prophylaxis:  ASA 81 mg daily and Plavix  75 mg daily and Lipitor 80 mg daily.    Gwendlyn JONELLE [facility] 08/27/2023 8:38 AM

## 2023-08-27 NOTE — Progress Notes (Signed)
 I was contacted by the patient's bedside nurse Sonny Albino RN.  Patient called her in the room and asked her to take him outside so he could smoke.  Unfortunately hospital policy is patients are not allowed at any time to leave the hospital to smoke.  [name] is less than 24 hours postoperative from right carotid endarterectomy.  He was on a nitroglycerin  drip to help control hypertensive blood pressures.  His last blood pressure is 142/82 heart rate of 74.  Nurse paused the nitroglycerin  infusion to see what his blood pressure would be.  It remained in the systolics of 140 or higher.  This is very much contraindicated to the care of the patient post carotid open endarterectomy.  Therefore I spoke to the patient at the bedside with his mother present.  We had a long detailed conversation with as to how his smoking has contributed to his need for surgery and has disastrous effects on his vasculature.  I offered the patient multiple medical avenues to treat his smoking addiction such as nicotine  patches as well as Ativan  and Wellbutrin to help with withdrawal from smoking.  Patient has refused all efforts to assist him in staying and recovering from his surgery and control his smoking addiction.  Patient wishes to leave AMA and states that he will do so if not allowed to go outside and smoke.  I asked the patient in front of his mother not to do that but he refuses.  I discussed in detail with his mother all the difficulties that he may have postoperative and what to watch for such as swelling to the incision site in his neck.  As well as stroke symptoms or signs and symptoms of a heart attack.  She verbalized her understanding and said she would watch him as he lives with her.  I asked the patient 1 more time not to leave AMA which is AGAINST MEDICAL ADVICE and let us  continue to treat him and possibly release him tomorrow morning if his blood pressure corrects.  Again he refused and said he is going home to  smoke. Sonny Albino RN his nurse was at the bedside during the entire conversation.   Patient will leave the hospital AMA.  He is instructed to go home and take his blood pressure medication.  He will not be given any new medications upon discharge at this time as he is leaving AGAINST MEDICAL ADVICE to go home and smoke.  Dr. [name] MD his surgeon was made aware of the patient's decisions.

## 2023-08-28 ENCOUNTER — Emergency Department

## 2023-08-28 ENCOUNTER — Encounter
Admission: EM | Disposition: A | Discharge status: Home / Self Care | Payer: Self-pay | Source: Home / Self Care | Attending: Vascular Surgery

## 2023-08-28 ENCOUNTER — Inpatient Hospital Stay: Admitting: Certified Registered Nurse Anesthetist

## 2023-08-28 ENCOUNTER — Inpatient Hospital Stay
Admission: EM | Admit: 2023-08-28 | Discharge: 2023-08-30 | DRG: 908 | Disposition: A | Discharge status: Home / Self Care | Attending: Vascular Surgery | Admitting: Vascular Surgery

## 2023-08-28 ENCOUNTER — Other Ambulatory Visit: Payer: Self-pay

## 2023-08-28 DIAGNOSIS — Z800 Family history of malignant neoplasm of digestive organs: Secondary | ICD-10-CM | POA: Diagnosis not present

## 2023-08-28 DIAGNOSIS — F17210 Nicotine dependence, cigarettes, uncomplicated: Secondary | ICD-10-CM | POA: Diagnosis present

## 2023-08-28 DIAGNOSIS — I6529 Occlusion and stenosis of unspecified carotid artery: Secondary | ICD-10-CM | POA: Diagnosis present

## 2023-08-28 DIAGNOSIS — T819XXA Unspecified complication of procedure, initial encounter: Secondary | ICD-10-CM

## 2023-08-28 DIAGNOSIS — N400 Enlarged prostate without lower urinary tract symptoms: Secondary | ICD-10-CM | POA: Diagnosis present

## 2023-08-28 DIAGNOSIS — F14150 Cocaine abuse with cocaine-induced psychotic disorder with delusions: Secondary | ICD-10-CM | POA: Diagnosis present

## 2023-08-28 DIAGNOSIS — I10 Essential (primary) hypertension: Secondary | ICD-10-CM | POA: Diagnosis present

## 2023-08-28 DIAGNOSIS — I671 Cerebral aneurysm, nonruptured: Secondary | ICD-10-CM | POA: Diagnosis present

## 2023-08-28 DIAGNOSIS — Z716 Tobacco abuse counseling: Secondary | ICD-10-CM | POA: Diagnosis not present

## 2023-08-28 DIAGNOSIS — Z79899 Other long term (current) drug therapy: Secondary | ICD-10-CM

## 2023-08-28 DIAGNOSIS — Z8673 Personal history of transient ischemic attack (TIA), and cerebral infarction without residual deficits: Secondary | ICD-10-CM | POA: Diagnosis not present

## 2023-08-28 DIAGNOSIS — R6883 Chills (without fever): Secondary | ICD-10-CM | POA: Diagnosis present

## 2023-08-28 DIAGNOSIS — Z833 Family history of diabetes mellitus: Secondary | ICD-10-CM

## 2023-08-28 DIAGNOSIS — Z9151 Personal history of suicidal behavior: Secondary | ICD-10-CM

## 2023-08-28 DIAGNOSIS — F4312 Post-traumatic stress disorder, chronic: Secondary | ICD-10-CM | POA: Diagnosis present

## 2023-08-28 DIAGNOSIS — Z7982 Long term (current) use of aspirin: Secondary | ICD-10-CM | POA: Diagnosis not present

## 2023-08-28 DIAGNOSIS — Z8249 Family history of ischemic heart disease and other diseases of the circulatory system: Secondary | ICD-10-CM | POA: Diagnosis not present

## 2023-08-28 DIAGNOSIS — J439 Emphysema, unspecified: Secondary | ICD-10-CM | POA: Diagnosis present

## 2023-08-28 DIAGNOSIS — I97638 Postprocedural hematoma of a circulatory system organ or structure following other circulatory system procedure: Principal | ICD-10-CM | POA: Diagnosis present

## 2023-08-28 DIAGNOSIS — S1093XA Contusion of unspecified part of neck, initial encounter: Secondary | ICD-10-CM | POA: Diagnosis present

## 2023-08-28 DIAGNOSIS — I6521 Occlusion and stenosis of right carotid artery: Secondary | ICD-10-CM | POA: Diagnosis present

## 2023-08-28 DIAGNOSIS — G8918 Other acute postprocedural pain: Secondary | ICD-10-CM | POA: Diagnosis present

## 2023-08-28 DIAGNOSIS — Z9181 History of falling: Secondary | ICD-10-CM | POA: Diagnosis not present

## 2023-08-28 DIAGNOSIS — Z9889 Other specified postprocedural states: Secondary | ICD-10-CM | POA: Diagnosis not present

## 2023-08-28 DIAGNOSIS — G894 Chronic pain syndrome: Secondary | ICD-10-CM | POA: Diagnosis present

## 2023-08-28 DIAGNOSIS — K219 Gastro-esophageal reflux disease without esophagitis: Secondary | ICD-10-CM | POA: Diagnosis present

## 2023-08-28 DIAGNOSIS — Z91148 Patient's other noncompliance with medication regimen for other reason: Secondary | ICD-10-CM | POA: Diagnosis not present

## 2023-08-28 DIAGNOSIS — F1210 Cannabis abuse, uncomplicated: Secondary | ICD-10-CM | POA: Diagnosis present

## 2023-08-28 DIAGNOSIS — F419 Anxiety disorder, unspecified: Secondary | ICD-10-CM | POA: Diagnosis present

## 2023-08-28 DIAGNOSIS — Z801 Family history of malignant neoplasm of trachea, bronchus and lung: Secondary | ICD-10-CM | POA: Diagnosis not present

## 2023-08-28 HISTORY — PX: ENDARTERECTOMY: SHX5162

## 2023-08-28 LAB — COMPREHENSIVE METABOLIC PANEL WITH GFR
ALT: 11 U/L (ref 0–44)
AST: 20 U/L (ref 15–41)
Albumin: 3.8 g/dL (ref 3.5–5.0)
Alkaline Phosphatase: 72 U/L (ref 38–126)
Anion gap: 10 (ref 5–15)
BUN: 13 mg/dL (ref 8–23)
CO2: 30 mmol/L (ref 22–32)
Calcium: 9 mg/dL (ref 8.9–10.3)
Chloride: 99 mmol/L (ref 98–111)
Creatinine, Ser: 0.74 mg/dL (ref 0.61–1.24)
GFR, Estimated: >60 mL/min (ref >60)
Glucose, Bld: 94 mg/dL (ref 70–99)
Potassium: 4.1 mmol/L (ref 3.5–5.1)
Sodium: 139 mmol/L (ref 135–145)
Total Bilirubin: 0.7 mg/dL (ref 0.0–1.2)
Total Protein: 7.3 g/dL (ref 6.5–8.1)

## 2023-08-28 LAB — CBC WITH DIFFERENTIAL/PLATELET
Abs Immature Granulocytes: 0.02 K/uL (ref 0.00–0.07)
Basophils Absolute: 0.1 K/uL (ref 0.0–0.1)
Basophils Relative: 1 %
Eosinophils Absolute: 0.1 K/uL (ref 0.0–0.5)
Eosinophils Relative: 1 %
HCT: 38.8 % — ABNORMAL LOW (ref 39.0–52.0)
Hemoglobin: 12.9 g/dL — ABNORMAL LOW (ref 13.0–17.0)
Immature Granulocytes: 0 %
Lymphocytes Relative: 18 %
Lymphs Abs: 1.4 K/uL (ref 0.7–4.0)
MCH: 29.7 pg (ref 26.0–34.0)
MCHC: 33.2 g/dL (ref 30.0–36.0)
MCV: 89.4 fL (ref 80.0–100.0)
Monocytes Absolute: 1.2 K/uL — ABNORMAL HIGH (ref 0.1–1.0)
Monocytes Relative: 15 %
Neutro Abs: 5.4 K/uL (ref 1.7–7.7)
Neutrophils Relative %: 65 %
Platelets: 254 K/uL (ref 150–400)
RBC: 4.34 MIL/uL (ref 4.22–5.81)
RDW: 13.4 % (ref 11.5–15.5)
WBC: 8.2 K/uL (ref 4.0–10.5)
nRBC: 0 % (ref 0.0–0.2)

## 2023-08-28 LAB — PROTIME-INR
INR: 1 (ref 0.8–1.2)
Prothrombin Time: 13.6 s (ref 11.4–15.2)

## 2023-08-28 LAB — LACTIC ACID, PLASMA: Lactic Acid, Venous: 1.5 mmol/L (ref 0.5–1.9)

## 2023-08-28 SURGERY — ENDARTERECTOMY, CAROTID
Anesthesia: General | Laterality: Right

## 2023-08-28 MED ORDER — MIDAZOLAM HCL 2 MG/2ML IJ SOLN
INTRAMUSCULAR | Status: DC | PRN
  Administered 2023-08-28: 2 mg via INTRAVENOUS

## 2023-08-28 MED ORDER — PROPOFOL 10 MG/ML IV BOLUS
INTRAVENOUS | Status: DC | PRN
  Administered 2023-08-28: 200 mg via INTRAVENOUS

## 2023-08-28 MED ORDER — HYDROMORPHONE HCL 1 MG/ML IJ SOLN
1.0000 mg | Freq: Once | INTRAMUSCULAR | Status: AC
  Administered 2023-08-28: 1 mg via INTRAVENOUS
  Filled 2023-08-28: qty 1

## 2023-08-28 MED ORDER — LIDOCAINE HCL (PF) 2 % IJ SOLN
INTRAMUSCULAR | Status: AC
  Filled 2023-08-28: qty 5

## 2023-08-28 MED ORDER — PHENYLEPHRINE HCL-NACL 20-0.9 MG/250ML-% IV SOLN
INTRAVENOUS | Status: DC | PRN
  Administered 2023-08-28: 50 ug/min via INTRAVENOUS

## 2023-08-28 MED ORDER — SUCCINYLCHOLINE CHLORIDE 200 MG/10ML IV SOSY
PREFILLED_SYRINGE | INTRAVENOUS | Status: AC
  Filled 2023-08-28: qty 10

## 2023-08-28 MED ORDER — DEXMEDETOMIDINE HCL IN NACL 80 MCG/20ML IV SOLN
INTRAVENOUS | Status: DC | PRN
  Administered 2023-08-28 (×2): 4 ug via INTRAVENOUS
  Administered 2023-08-29: 12 ug via INTRAVENOUS

## 2023-08-28 MED ORDER — CEFAZOLIN SODIUM-DEXTROSE 2-3 GM-%(50ML) IV SOLR
INTRAVENOUS | Status: DC | PRN
  Administered 2023-08-28: 2 g via INTRAVENOUS

## 2023-08-28 MED ORDER — MIDAZOLAM HCL 2 MG/2ML IJ SOLN
INTRAMUSCULAR | Status: AC
  Filled 2023-08-28: qty 2

## 2023-08-28 MED ORDER — HEPARIN SODIUM (PORCINE) 5000 UNIT/ML IJ SOLN
INTRAMUSCULAR | Status: AC
  Filled 2023-08-28: qty 1

## 2023-08-28 MED ORDER — PROPOFOL 10 MG/ML IV BOLUS
INTRAVENOUS | Status: AC
  Filled 2023-08-28: qty 20

## 2023-08-28 MED ORDER — FENTANYL CITRATE (PF) 100 MCG/2ML IJ SOLN
INTRAMUSCULAR | Status: DC | PRN
  Administered 2023-08-28 (×2): 50 ug via INTRAVENOUS

## 2023-08-28 MED ORDER — IOHEXOL 350 MG/ML SOLN
75.0000 mL | Freq: Once | INTRAVENOUS | Status: AC | PRN
  Administered 2023-08-28: 75 mL via INTRAVENOUS

## 2023-08-28 MED ORDER — LIDOCAINE HCL (CARDIAC) PF 100 MG/5ML IV SOSY
PREFILLED_SYRINGE | INTRAVENOUS | Status: DC | PRN
  Administered 2023-08-28: 80 mg via INTRAVENOUS

## 2023-08-28 MED ORDER — LACTATED RINGERS IV SOLN: INTRAVENOUS | Status: DC | PRN

## 2023-08-28 MED ORDER — GELATIN ABSORBABLE 12-7 MM EX MISC
CUTANEOUS | Status: AC
  Filled 2023-08-28: qty 1

## 2023-08-28 MED ORDER — DEXAMETHASONE SODIUM PHOSPHATE 10 MG/ML IJ SOLN
INTRAMUSCULAR | Status: DC | PRN
  Administered 2023-08-28: 10 mg via INTRAVENOUS

## 2023-08-28 MED ORDER — VASHE WOUND IRRIGATION OPTIME
TOPICAL | Status: DC | PRN
  Administered 2023-08-28: 34 [oz_av]

## 2023-08-28 MED ORDER — FENTANYL CITRATE (PF) 100 MCG/2ML IJ SOLN
INTRAMUSCULAR | Status: AC
Start: 2023-08-28 — End: 2023-08-28
  Filled 2023-08-28: qty 2

## 2023-08-28 MED ORDER — HEMOSTATIC AGENTS (NO CHARGE) OPTIME
TOPICAL | Status: DC | PRN
  Administered 2023-08-28: 1 via TOPICAL

## 2023-08-28 MED ORDER — PHENYLEPHRINE HCL-NACL 20-0.9 MG/250ML-% IV SOLN
INTRAVENOUS | Status: AC
Start: 2023-08-28 — End: 2023-08-28
  Filled 2023-08-28: qty 250

## 2023-08-28 MED ORDER — ONDANSETRON HCL 4 MG/2ML IJ SOLN
INTRAMUSCULAR | Status: AC
  Filled 2023-08-28: qty 2

## 2023-08-28 MED ORDER — ONDANSETRON HCL 4 MG/2ML IJ SOLN
INTRAMUSCULAR | Status: DC | PRN
  Administered 2023-08-28: 4 mg via INTRAVENOUS

## 2023-08-28 MED ORDER — 0.9 % SODIUM CHLORIDE (POUR BTL) OPTIME
TOPICAL | Status: DC | PRN
  Administered 2023-08-28: 1000 mL

## 2023-08-28 MED ORDER — LIDOCAINE HCL (PF) 1 % IJ SOLN
INTRAMUSCULAR | Status: AC
  Filled 2023-08-28: qty 30

## 2023-08-28 MED ORDER — PHENYLEPHRINE 80 MCG/ML (10ML) SYRINGE FOR IV PUSH (FOR BLOOD PRESSURE SUPPORT)
PREFILLED_SYRINGE | INTRAVENOUS | Status: DC | PRN
  Administered 2023-08-28 (×2): 160 ug via INTRAVENOUS
  Administered 2023-08-28: 320 ug via INTRAVENOUS
  Administered 2023-08-28 (×2): 80 ug via INTRAVENOUS
  Administered 2023-08-28: 160 ug via INTRAVENOUS
  Administered 2023-08-28: 80 ug via INTRAVENOUS
  Administered 2023-08-28: 160 ug via INTRAVENOUS

## 2023-08-28 MED ORDER — SUCCINYLCHOLINE CHLORIDE 200 MG/10ML IV SOSY
PREFILLED_SYRINGE | INTRAVENOUS | Status: DC | PRN
  Administered 2023-08-28: 100 mg via INTRAVENOUS

## 2023-08-28 MED ORDER — ROCURONIUM BROMIDE 100 MG/10ML IV SOLN
INTRAVENOUS | Status: DC | PRN
  Administered 2023-08-28: 40 mg via INTRAVENOUS

## 2023-08-28 SURGICAL SUPPLY — 45 items
BAG DECANTER FOR FLEXI CONT (MISCELLANEOUS) ×1 IMPLANT
BLADE SURG 15 STRL LF DISP TIS (BLADE) ×1 IMPLANT
BLADE SURG SZ11 CARB STEEL (BLADE) ×1 IMPLANT
BRUSH SCRUB EZ 4% CHG (MISCELLANEOUS) ×1 IMPLANT
CHLORAPREP W/TINT 26 (MISCELLANEOUS) ×1 IMPLANT
CLAMP SUTURE YELLOW 5 PAIRS (MISCELLANEOUS) ×1 IMPLANT
CLEANSER WND VASHE 34 (WOUND CARE) IMPLANT
DERMABOND ADVANCED .7 DNX12 (GAUZE/BANDAGES/DRESSINGS) ×1 IMPLANT
DRAPE INCISE IOBAN 66X45 STRL (DRAPES) ×1 IMPLANT
DRAPE LAPAROTOMY 77X122 PED (DRAPES) ×1 IMPLANT
DRAPE SHEET LG 3/4 BI-LAMINATE (DRAPES) ×1 IMPLANT
DRESSING SURGICEL FIBRLLR 1X2 (HEMOSTASIS) ×1 IMPLANT
DRSG TELFA 4X8 ISLAND PHMB (GAUZE/BANDAGES/DRESSINGS) IMPLANT
ELECT CAUTERY BLADE 6.4 (BLADE) ×1 IMPLANT
ELECTRODE REM PT RTRN 9FT ADLT (ELECTROSURGICAL) ×1 IMPLANT
GLOVE BIO SURGEON STRL SZ7 (GLOVE) ×2 IMPLANT
GOWN STRL REUS W/ TWL LRG LVL3 (GOWN DISPOSABLE) ×3 IMPLANT
GOWN STRL REUS W/TWL 2XL LVL3 (GOWN DISPOSABLE) ×2 IMPLANT
IV NS 250ML BAXH (IV SOLUTION) ×1 IMPLANT
KIT TURNOVER KIT A (KITS) ×1 IMPLANT
LABEL OR SOLS (LABEL) ×1 IMPLANT
LOOP VESSEL MAXI 1X406 RED (MISCELLANEOUS) ×2 IMPLANT
LOOP VESSEL MINI 0.8X406 BLUE (MISCELLANEOUS) ×1 IMPLANT
MANIFOLD NEPTUNE II (INSTRUMENTS) ×1 IMPLANT
NDL FILTER BLUNT 18X1 1/2 (NEEDLE) ×1 IMPLANT
NDL HYPO 25X1 1.5 SAFETY (NEEDLE) ×1 IMPLANT
NEEDLE FILTER BLUNT 18X1 1/2 (NEEDLE) ×1 IMPLANT
NEEDLE HYPO 25X1 1.5 SAFETY (NEEDLE) ×1 IMPLANT
NS IRRIG 500ML POUR BTL (IV SOLUTION) ×1 IMPLANT
OINTMENT BETADINE 1.5GM (MISCELLANEOUS) IMPLANT
PACK BASIN MAJOR ARMC (MISCELLANEOUS) ×1 IMPLANT
SET WALTER ACTIVATION W/DRAPE (SET/KITS/TRAYS/PACK) ×1 IMPLANT
SHUNT W TPORT 9FR PRUITT F3 (SHUNT) ×1 IMPLANT
SPONGE SURGIFOAM ABS GEL 12-7 (HEMOSTASIS) IMPLANT
SPONGE T-LAP 18X18 ~~LOC~~+RFID (SPONGE) ×2 IMPLANT
SUT PROLENE 6 0 BV (SUTURE) ×4 IMPLANT
SUT PROLENE 7 0 BV 1 (SUTURE) ×2 IMPLANT
SUT SILK 2-0 18XBRD TIE 12 (SUTURE) ×1 IMPLANT
SUT SILK 3-0 18XBRD TIE 12 (SUTURE) ×1 IMPLANT
SUT SILK 4-0 18XBRD TIE 12 (SUTURE) ×1 IMPLANT
SUT VIC AB 3-0 SH 27X BRD (SUTURE) ×2 IMPLANT
SUTURE MNCRL 4-0 27XMF (SUTURE) ×1 IMPLANT
SYR 10ML LL (SYRINGE) ×2 IMPLANT
SYR 20ML LL LF (SYRINGE) ×1 IMPLANT
TRAP FLUID SMOKE EVACUATOR (MISCELLANEOUS) ×1 IMPLANT

## 2023-08-28 NOTE — Anesthesia Procedure Notes (Signed)
 Procedure Name: Intubation Date/Time: 08/28/2023 11:01 PM  Performed by: Delores Evalene BROCKS, CRNAPre-anesthesia Checklist: Patient identified, Patient being monitored, Timeout performed, Emergency Drugs available and Suction available Patient Re-evaluated:Patient Re-evaluated prior to induction Oxygen Delivery Method: Circle system utilized Preoxygenation: Pre-oxygenation with 100% oxygen Induction Type: IV induction and Rapid sequence Laryngoscope Size: McGrath and 4 Grade View: Grade I Tube type: Oral Tube size: 7.5 mm Number of attempts: 1 Airway Equipment and Method: Stylet and Video-laryngoscopy Placement Confirmation: ETT inserted through vocal cords under direct vision, positive ETCO2 and breath sounds checked- equal and bilateral Secured at: 21 cm Tube secured with: Tape Dental Injury: Teeth and Oropharynx as per pre-operative assessment

## 2023-08-28 NOTE — ED Triage Notes (Signed)
 Pt arrives via [facility] from home due to swelling and fluid coming from incision site from carotid artery surgery that the pt had on Thursday   EMS Vitals 99.3 120 CBG 90 bpm  98 o2 153/100

## 2023-08-28 NOTE — H&P (Addendum)
 [facility] Admission History & Physical  MRN : [id number]  Alvin Gonzalez. is a 63 y.o. ([birth year]) male who presents with chief complaint of  Chief Complaint  Patient presents with   Post-op Problem  .  History of Present Illness: 13 yom s/p RIGHT Carotid endarterectomy on 08/26/2023. Postoperatively the patient required nitroglycerin  gtt for elevated blood pressure. On POD #1 the patient left AMA to smoke and went home. He returns this evening with progressive swelling and drainage from the right neck incision. He states it has been increasingly difficult to swallow. Denies difficulty with breathing. Denies new weakness in extremities, change in speech or other neurologic symptoms.  Presented to ER, upon arrival CTA obtained- large right neck hematoma without clear evidence of extravasation.  No current facility-administered medications for this encounter.   Current Outpatient Medications  Medication Sig Dispense Refill   albuterol  (VENTOLIN  HFA) 108 (90 Base) MCG/ACT inhaler Inhale 2 inhalations into the lungs every 6 (six) hours as needed 6.7 g 11   amLODipine  (NORVASC ) 10 MG tablet Take 1 tablet (10 mg total) by mouth daily. 90 tablet 0   aspirin  EC 81 MG tablet Take 1 tablet (81 mg total) by mouth daily. Swallow whole. 150 tablet 2   atorvastatin  (LIPITOR) 80 MG tablet Take 1 tablet (80 mg total) by mouth daily. (Patient not taking: Reported on 08/23/2023) 30 tablet 0   hydrochlorothiazide  (HYDRODIURIL ) 25 MG tablet Take 1 tablet by mouth daily.      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, with delusions and tactile/visual hallucinations 04/20/2022   COPD (chronic obstructive pulmonary disease) (HCC) 05/27/2023   Depression    Diastolic dysfunction    Emphysema of lung (HCC)    Erectile dysfunction 03/26/2017   GERD (gastroesophageal reflux disease)    Hemorrhoids    HLD (hyperlipidemia)    Hypertension    Intermittent explosive disorder    Left sided lacunar infarction (HCC) 05/18/2023   a.) CT head 05/18/2023: acute LEFT thalamocapsular lacunar infarction   Polysubstance abuse (HCC)    a.) cocaine + THC + opioids + tobacco   Prediabetes 02/20/2021   PTSD (post-traumatic stress disorder)    Pulmonary nodule 12/10/2016   Right-sided carotid artery disease (HCC)    a.) CTA head/neck 05/27/2023: 80% origin RICA   Risk for falls    Stenosis of right carotid artery 05/28/2023   Suicide attempt (HCC)    prior to 02/18/14 admission     Past Surgical History:  Procedure Laterality Date   CHEST TUBE INSERTION     COLONOSCOPY WITH PROPOFOL  N/A 05/14/2021   Procedure: COLONOSCOPY WITH PROPOFOL ;  Surgeon: Therisa Bi, MD;  Location: [facility] ENDOSCOPY;  Service: Gastroenterology;  Laterality: N/A;   ENDARTERECTOMY Right 08/26/2023   Procedure: ENDARTERECTOMY, CAROTID;  Surgeon: Marea Selinda RAMAN, MD;  Location: [facility] ORS;  Service: Vascular;  Laterality: Right;   EVALUATION UNDER ANESTHESIA WITH HEMORRHOIDECTOMY N/A 09/10/2021  Procedure: EXAM UNDER ANESTHESIA WITH HEMORRHOIDECTOMY of 2+ columns;  Surgeon: Desiderio Schanz, MD;  Location: [facility] ORS;  Service: General;  Laterality: N/A;  Provider requesting 1.5 hours / 90 minutes for procedure.   LYMPH NODE BIOPSY       Social History   Tobacco Use   Smoking status: Every Day    Current packs/day: 1.00    Average packs/day: 1 pack/day for 45.0 years  (45.0 ttl pk-yrs)    Types: Cigarettes   Smokeless tobacco: Former    Quit date: 01/30/2015  Vaping Use   Vaping status: Former   Quit date: 01/23/2017  Substance Use Topics   Alcohol use: Yes    Comment: occ   Drug use: Yes    Types: Marijuana, Cocaine    Comment: special occasions , last use 05/19/21. uses cocaine, last use 04/17/22     Family History  Problem Relation Age of Onset   Diabetes Mother    Hypertension Mother    Other Mother        possible ovarian cancer   Heart disease Father    Hypertension Father    Cirrhosis Father    Liver cancer Father    Lung cancer Sister    Heart attack Maternal Grandmother    Suicidality Maternal Grandfather    Other Paternal Grandmother        unknown medical history   Suicidality Paternal Grandfather    Stomach cancer Cousin     Allergies  Allergen Reactions   Carbamazepine Rash and Hives   Ibuprofen Nausea And Vomiting, Rash and Other (See Comments)    GI Bleeding    Risperidone  Swelling    Tongue swelling   Naproxen  Other (See Comments)    GI bleeding     REVIEW OF SYSTEMS (Negative unless checked)  Constitutional: [] Weight loss  [] Fever  [] Chills Cardiac: [] Chest pain   [] Chest pressure   [] Palpitations   [] Shortness of breath when laying flat   [] Shortness of breath at rest   [] Shortness of breath with exertion. Vascular:  [] Pain in legs with walking   [] Pain in legs at rest   [] Pain in legs when laying flat   [] Claudication   [] Pain in feet when walking  [] Pain in feet at rest  [] Pain in feet when laying flat   [] History of DVT   [] Phlebitis   [] Swelling in legs   [] Varicose veins   [] Non-healing ulcers Pulmonary:   [] Uses home oxygen   [] Productive cough   [] Hemoptysis   [] Wheeze  [] COPD   [] Asthma Neurologic:  [] Dizziness  [] Blackouts   [] Seizures   [] History of stroke   [] History of TIA  [] Aphasia   [] Temporary blindness   [] Dysphagia   [] Weakness or numbness in arms   [] Weakness or numbness in  legs Musculoskeletal:  [] Arthritis   [] Joint swelling   [] Joint pain   [] Low back pain Hematologic:  [] Easy bruising  [] Easy bleeding   [] Hypercoagulable state   [] Anemic  [] Hepatitis Gastrointestinal:  [] Blood in stool   [] Vomiting blood  [] Gastroesophageal reflux/heartburn   [] Difficulty swallowing. Genitourinary:  [] Chronic kidney disease   [] Difficult urination  [] Frequent urination  [] Burning with urination   [] Blood in urine Skin:  [] Rashes   [] Ulcers   [] Wounds Psychological:  [] History of anxiety   []  History of major depression.  Physical Examination  Vitals:   08/28/23 1911 08/28/23 1912 08/28/23 1915 08/28/23 1934  BP:   (!) 165/99   Pulse:   93 100  Resp:  18 (!) 30  Temp:      TempSrc:      SpO2: 99%  99% 93%  Weight:  61.7 kg    Height:  5' 5 (1.651 m)     Body mass index is 22.64 kg/m. Gen: WD/WN, NAD Head: [place]/AT, No temporalis wasting. Prominent temp pulse not noted. Ear/Nose/Throat: Hearing grossly intact, nares w/o erythema or drainage, oropharynx w/o Erythema/Exudate,  Neck: Trachea midline. RIGHT neck- large hematoma with swelling, firm, tender, serosang drainage from distal incision  Pulmonary:  Good air movement, respirations not labored, no use of accessory muscles.  Cardiac: RRR, normal S1, S2. Vascular:  Vessel Right Left  Radial Palpable Palpable                          PT Palpable Palpable  DP Palpable Palpable   Gastrointestinal: soft, non-tender/non-distended. No guarding/reflex.  Musculoskeletal: M/S 5/5 throughout.  Extremities without ischemic changes.  No deformity or atrophy.  Neurologic: Sensation grossly intact in extremities.  Symmetrical.  Speech is fluent. Motor exam as listed above. Psychiatric: Judgment intact, Mood & affect appropriate for pt's clinical situation.      CBC Lab Results  Component Value Date   WBC 8.2 08/28/2023   HGB 12.9 (L) 08/28/2023   HCT 38.8 (L) 08/28/2023   MCV 89.4 08/28/2023   PLT 254  08/28/2023    BMET    Component Value Date/Time   NA 139 08/28/2023 1916   NA 144 01/23/2021 1918   NA 144 02/18/2014 0914   K 4.1 08/28/2023 1916   K 3.0 (L) 02/18/2014 0914   CL 99 08/28/2023 1916   CL 106 02/18/2014 0914   CO2 30 08/28/2023 1916   CO2 28 02/18/2014 0914   GLUCOSE 94 08/28/2023 1916   GLUCOSE 137 (H) 02/18/2014 0914   BUN 13 08/28/2023 1916   BUN 13 01/23/2021 1918   BUN 15 02/18/2014 0914   CREATININE 0.74 08/28/2023 1916   CREATININE 2.18 (H) 02/18/2014 0914   CALCIUM  9.0 08/28/2023 1916   CALCIUM  8.9 02/18/2014 0914   GFRNONAA >60 08/28/2023 1916   GFRNONAA 34 (L) 02/18/2014 0914   GFRNONAA >60 07/16/2011 2028   GFRAA >60 11/01/2016 1421   GFRAA 41 (L) 02/18/2014 0914   GFRAA >60 07/16/2011 2028   Estimated Creatinine Clearance: 84.3 mL/min (by C-G formula based on SCr of 0.74 mg/dL).  COAG Lab Results  Component Value Date   INR 1.0 08/28/2023   INR 1.0 05/28/2023   INR 0.9 05/18/2023    Radiology CT Angio Neck W and/or Wo Contrast Result Date: 08/28/2023 EXAM: CTA Neck 08/28/2023 07:48:30 PM TECHNIQUE: CT of the neck was performed with the administration of intravenous contrast. Multiplanar 2D and/or 3D reformatted images are provided for review. Automated exposure control, iterative reconstruction, and/or weight based adjustment of the mA/kV was utilized to reduce the radiation dose to as low as reasonably achievable. Stenosis of the internal carotid arteries measured using NASCET criteria. COMPARISON: 05/27/2023 CLINICAL HISTORY: 2 days out from right carotid endarterectomy. Local swelling, pain and bleeding. Evaluate abscess, leaking bovine graft. FINDINGS: AORTIC ARCH AND ARCH VESSELS: No dissection or arterial injury. Aberrant right subclavian artery. No significant stenosis of the brachiocephalic or subclavian arteries. CERVICAL CAROTID ARTERIES: Postsurgical changes of right carotid endarterectomy. Despite the large hematoma, there is no  visualized active extravasation from the right carotid system. 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 CERVICAL VERTEBRAL ARTERIES: No dissection, arterial injury, or significant stenosis. LUNGS AND MEDIASTINUM: Mild biapical emphysema. SOFT TISSUES: Large hematoma in the right neck originating in the carotid space and measuring 4.4 x 3.0 x 7.0 cm. BONES: No acute abnormality. IMPRESSION: 1. Large hematoma in the right neck originating in the carotid space, measuring 4.4 x 3.0 x 7.0 cm, without active extravasation from the right carotid system. 2. 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 Electronically signed by: Franky Stanford MD 08/28/2023 08:53 PM EDT RP Workstation: [id number]   DG Chest Port 1 View Result Date: 08/28/2023 CLINICAL DATA:  Questionable sepsis - evaluate for abnormality EXAM: PORTABLE CHEST - 1 VIEW COMPARISON:  06/04/2023 FINDINGS: Lungs are clear. Persistent blunting of the right lateral costophrenic angle. Heart size and mediastinal contours are within normal limits. No effusion. Visualized bones unremarkable. IMPRESSION: No acute cardiopulmonary disease. Electronically Signed   By: JONETTA Faes M.D.   On: 08/28/2023 20:01   CT ABDOMEN PELVIS W CONTRAST Result Date: 08/09/2023 CLINICAL DATA:  Abdominal pain with vomiting and diarrhea. EXAM: CT ABDOMEN AND PELVIS WITH CONTRAST TECHNIQUE: Multidetector CT imaging of the abdomen and pelvis was performed using the standard protocol following bolus administration of intravenous contrast. RADIATION DOSE REDUCTION: This exam was performed according to the departmental dose-optimization program which includes automated exposure control, adjustment of the mA and/or kV according to patient size and/or use of iterative reconstruction technique. CONTRAST:  OMNIPAQUE  IOHEXOL  300 MG/ML  SOLN  COMPARISON:  May 27, 2023 FINDINGS: Lower chest: No acute abnormality. Hepatobiliary: No focal liver abnormality is seen. No gallstones, gallbladder wall thickening, or biliary dilatation. Pancreas: Unremarkable. No pancreatic ductal dilatation or surrounding inflammatory changes. Spleen: Normal in size without focal abnormality. Adrenals/Urinary Tract: Adrenal glands are unremarkable. Kidneys are normal, without renal calculi, focal lesion, or hydronephrosis. Bladder is unremarkable. Stomach/Bowel: Stomach is within normal limits. Appendix appears normal. No evidence of bowel wall thickening, distention, or inflammatory changes. Vascular/Lymphatic: Aortic atherosclerosis. No enlarged abdominal or pelvic lymph nodes. Reproductive: The prostate gland is mildly enlarged. Other: No abdominal wall hernia or abnormality. No abdominopelvic ascites. Musculoskeletal: Multiple chronic right-sided rib fractures are seen. Multilevel degenerative changes are noted throughout the lumbar spine. IMPRESSION: 1. No acute or active process within the abdomen or pelvis. 2. Multiple chronic right-sided rib fractures. 3. Aortic atherosclerosis. Electronically Signed   By: Suzen Dials M.D.   On: 08/09/2023 00:10     Assessment/Plan 1. S/P RIGHT Carotid endarterectomy- now with large hematoma and drainage.  Will take EMERGENTLY to OR for RIGHT neck Hematoma evacuation and carotid exploration. Patient will require ICU for BP control and continued monitoring. Extensive discussion with patient regarding risks, benefits and alternatives to intended procedure. Including but not limited to- infection, bleeding, further procedures, stroke. All questions answered. Understanding expressed. Consent obtained.    Tisa Curry LABOR, MD  08/28/2023 10:15 PM

## 2023-08-28 NOTE — ED Provider Notes (Signed)
 [facility] Provider Note    Event Date/Time   First MD Initiated Contact with Patient 08/28/23 1911     (approximate)   History   Post-op Problem   HPI  Alvin Gonzalez. is a 63 y.o. male who presents to the ED for evaluation of Post-op Problem   I reviewed op note from 2 days ago.  Patient had an open right sided carotid endarterectomy with bovine pericardial patch reconstruction.   Patient presents to the ED from home via EMS for evaluation of painful swelling to the right side of his neck.  Reports that it has been this swollen since he got out from surgery but he has a generally sensation of feeling unwell and worsening odynophagia. Subjective chills/sweats.  Reports there has been some discharge from the incision, some blood, some white or clear stuff. No vision changes or weakness to the extremities or other strokelike symptoms.   Physical Exam   Triage Vital Signs: ED Triage Vitals [08/28/23 1910]  Encounter Vitals Group     BP      Girls Systolic BP Percentile      Girls Diastolic BP Percentile      Boys Systolic BP Percentile      Boys Diastolic BP Percentile      Pulse      Resp      Temp 98.8 F (37.1 C)     Temp Source Oral     SpO2      Weight      Height      Head Circumference      Peak Flow      Pain Score      Pain Loc      Pain Education      Exclude from Growth Chart     Most recent vital signs: Vitals:   08/28/23 1915 08/28/23 1934  BP: (!) 165/99   Pulse: 93 100  Resp: 18 (!) 30  Temp:    SpO2: 99% 93%    General: Awake, no distress.  CV:  Good peripheral perfusion.  Resp:  Normal effort.  Abd:  No distention.  MSK:  No deformity noted.  Neuro:  No focal deficits appreciated. Other:  Swelling localized to the right side of the neck without overlying rash, erythema or particular skin changes.  No dehiscence.  Unable to palpate any pulsatile sensation.  Able to open his mouth widely without signs of  upper airway obstruction or uvular deviation.  Speaking in a normal voice      ED Results / Procedures / Treatments   Labs (all labs ordered are listed, but only abnormal results are displayed) Labs Reviewed  CBC WITH DIFFERENTIAL/PLATELET - Abnormal; Notable for the following components:      Result Value   Hemoglobin 12.9 (*)    HCT 38.8 (*)    Monocytes Absolute 1.2 (*)    All other components within normal limits  CULTURE, BLOOD (ROUTINE X 2)  CULTURE, BLOOD (ROUTINE X 2)  LACTIC ACID, PLASMA  COMPREHENSIVE METABOLIC PANEL WITH GFR  PROTIME-INR  LACTIC ACID, PLASMA  URINALYSIS, ROUTINE W REFLEX MICROSCOPIC    EKG   RADIOLOGY CTA neck interpreted me with large hematoma but no active extravasation.  Official radiology report(s): CT Angio Neck W and/or Wo Contrast Result Date: 08/28/2023 EXAM: CTA Neck 08/28/2023 07:48:30 PM TECHNIQUE: CT of the neck was performed with the administration of intravenous contrast. Multiplanar 2D and/or 3D reformatted images are provided  for review. Automated exposure control, iterative reconstruction, and/or weight based adjustment of the mA/kV was utilized to reduce the radiation dose to as low as reasonably achievable. Stenosis of the internal carotid arteries measured using NASCET criteria. COMPARISON: 05/27/2023 CLINICAL HISTORY: 2 days out from right carotid endarterectomy. Local swelling, pain and bleeding. Evaluate abscess, leaking bovine graft. FINDINGS: AORTIC ARCH AND ARCH VESSELS: No dissection or arterial injury. Aberrant right subclavian artery. No significant stenosis of the brachiocephalic or subclavian arteries. CERVICAL CAROTID ARTERIES: Postsurgical changes of right carotid endarterectomy. Despite the large hematoma, there is no visualized active extravasation from the right carotid system. 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 CERVICAL VERTEBRAL ARTERIES: No  dissection, arterial injury, or significant stenosis. LUNGS AND MEDIASTINUM: Mild biapical emphysema. SOFT TISSUES: Large hematoma in the right neck originating in the carotid space and measuring 4.4 x 3.0 x 7.0 cm. BONES: No acute abnormality. IMPRESSION: 1. Large hematoma in the right neck originating in the carotid space, measuring 4.4 x 3.0 x 7.0 cm, without active extravasation from the right carotid system. 2. 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 Electronically signed by: Franky Stanford MD 08/28/2023 08:53 PM EDT RP Workstation: [id number]   DG Chest Port 1 View Result Date: 08/28/2023 CLINICAL DATA:  Questionable sepsis - evaluate for abnormality EXAM: PORTABLE CHEST - 1 VIEW COMPARISON:  06/04/2023 FINDINGS: Lungs are clear. Persistent blunting of the right lateral costophrenic angle. Heart size and mediastinal contours are within normal limits. No effusion. Visualized bones unremarkable. IMPRESSION: No acute cardiopulmonary disease. Electronically Signed   By: JONETTA Faes M.D.   On: 08/28/2023 20:01    PROCEDURES and INTERVENTIONS:  .Critical Care  Performed by: Claudene Rover, MD Authorized by: Claudene Rover, MD   Critical care provider statement:    Critical care time (minutes):  30   Critical care time was exclusive of:  Separately billable procedures and treating other patients   Critical care was necessary to treat or prevent imminent or life-threatening deterioration of the following conditions:  Circulatory failure   Critical care was time spent personally by me on the following activities:  Development of treatment plan with patient or surrogate, discussions with consultants, evaluation of patient's response to treatment, examination of patient, ordering and review of laboratory studies, ordering and review of radiographic studies, ordering and performing treatments and interventions, pulse oximetry,  re-evaluation of patient's condition and review of old charts   Medications  iohexol  (OMNIPAQUE ) 350 MG/ML injection 75 mL (75 mLs Intravenous Contrast Given 08/28/23 1940)  HYDROmorphone  (DILAUDID ) injection 1 mg (1 mg Intravenous Given 08/28/23 2006)     IMPRESSION / MDM / ASSESSMENT AND PLAN / ED COURSE  I reviewed the triage vital signs and the nursing notes.  Differential diagnosis includes, but is not limited to, abscess, upper airway obstruction, postoperative hematoma  {Patient presents with symptoms of an acute illness or injury that is potentially life-threatening.  Patient presents with a postoperative vascular hematoma to right carotid artery following endarterectomy requiring return to the OR with vascular surgery.  Hematoma does not seem to be rapidly expanding within the 3 hours that I have him.  No signs of neurologic deficits or airway obstruction.  No signs of sepsis or superimposed infection or dehiscence of the wound.  CTA imaging as above.  Consult vascular surgery  who evaluates the patient and takes him to the OR for evacuation of hematoma and exploration.  Clinical Course as of 08/28/23 2204  Sat Aug 28, 2023  8076 reassessed [DS]  2106 I consult with Dr. Tisa, she will come evaluate [DS]  2149 Dr. Tisa has seen the patient and will take to the OR [DS]    Clinical Course User Index [DS] Claudene Rover, MD     FINAL CLINICAL IMPRESSION(S) / ED DIAGNOSES   Final diagnoses:  Post-operative pain  Postoperative hematoma involving circulatory system following other circulatory system procedure     Rx / DC Orders   ED Discharge Orders     None        Note:  This document was prepared using Dragon voice recognition software and may include unintentional dictation errors.   Claudene Rover, MD 08/28/23 [phone]

## 2023-08-28 NOTE — Anesthesia Preprocedure Evaluation (Addendum)
 Anesthesia Evaluation  Patient identified by MRN, date of birth, ID band Patient awake    Reviewed: Allergy & Precautions, NPO status , Patient's Chart, lab work & pertinent test results  History of Anesthesia Complications Negative for: history of anesthetic complications  Airway Mallampati: III  TM Distance: >3 FB Neck ROM: full    Dental  (+) Dental Advidsory Given, Missing, Poor Dentition   Pulmonary neg shortness of breath, neg sleep apnea, neg COPD, neg recent URI, Current Smoker   Pulmonary exam normal        Cardiovascular hypertension, (-) angina (-) CAD, (-) Past MI and (-) CABG Normal cardiovascular exam(-) dysrhythmias (-) Valvular Problems/Murmurs     Neuro/Psych neg Seizures PSYCHIATRIC DISORDERS Anxiety Depression    CVA    GI/Hepatic Neg liver ROS,GERD  Medicated,,  Endo/Other  negative endocrine ROS    Renal/GU      Musculoskeletal   Abdominal   Peds  Hematology negative hematology ROS (+)   Anesthesia Other Findings Patient ate full dinner around 6 hrs ago. Per surgeon's request, will proceed as it is an emergency. Patient with an elevated blood pressure after having a right carotid endarterectomy 2 days ago, and left AMA to go home and smoke. Discussed with the patient that he is at an elevated risk of complications under anesthesia including heart attack, stroke and possibly death. After a lengthy discussion about risk, patient dismissed any concerns for complications and didn't think his blood pressure had anything to do with his current complications.   Past Medical History: No date: Adrenal insufficiency (HCC) No date: AKI (acute kidney injury) (HCC) No date: Anxiety No date: Cancer (HCC) No date: Chronic back pain No date: Chronic pain No date: Chronic prescription opiate use No date: Depression No date: Depression No date: GERD (gastroesophageal reflux disease) No date: Hemorrhoids No  date: Hypertension No date: Hypokalemia No date: Intermittent explosive disorder No date: Marijuana use No date: PTSD (post-traumatic stress disorder) No date: Pulmonary nodule No date: Risk for falls No date: Suicide attempt [facility])     Comment:  prior to 02/18/14 admission  No date: Tobacco abuse  Past Surgical History: No date: CHEST TUBE INSERTION 05/14/2021: COLONOSCOPY WITH PROPOFOL ; N/A     Comment:  Procedure: COLONOSCOPY WITH PROPOFOL ;  Surgeon: Therisa Bi, MD;  Location: [facility] ENDOSCOPY;  Service:               Gastroenterology;  Laterality: N/A; No date: LYMPH NODE BIOPSY  BMI    Body Mass Index: 24.46 kg/m      Reproductive/Obstetrics negative OB ROS                              Anesthesia Physical Anesthesia Plan  ASA: 4 and emergent  Anesthesia Plan: General   Post-op Pain Management:    Induction: Intravenous and Rapid sequence  PONV Risk Score and Plan: 2 and Dexamethasone , Ondansetron , Midazolam  and Treatment may vary due to age or medical condition  Airway Management Planned: Oral ETT  Additional Equipment: Arterial line  Intra-op Plan:   Post-operative Plan: Extubation in OR  Informed Consent: I have reviewed the patients History and Physical, chart, labs and discussed the procedure including the risks, benefits and alternatives for the proposed anesthesia with the patient or authorized representative who has indicated his/her understanding and acceptance.     Dental Advisory  Given  Plan Discussed with: Anesthesiologist, CRNA and Surgeon  Anesthesia Plan Comments: (Patient consented for risks of anesthesia including but not limited to:  - adverse reactions to medications - damage to eyes, teeth, lips or other oral mucosa - nerve damage due to positioning  - sore throat or hoarseness - Damage to heart, brain, nerves, lungs, other parts of body or loss of life  Patient voiced understanding.)          Anesthesia Quick Evaluation

## 2023-08-28 NOTE — Anesthesia Procedure Notes (Signed)
 Arterial Line Insertion Start/End7/19/2025 10:30 AM, 08/28/2023 10:35 AM Performed by: Leavy Ned, MD, anesthesiologist  Patient location: Pre-op. Preanesthetic checklist: patient identified, IV checked, site marked, risks and benefits discussed, surgical consent, monitors and equipment checked, pre-op evaluation, timeout performed and anesthesia consent Lidocaine  1% used for infiltration radial was placed Catheter size: 20 G Hand hygiene performed  and maximum sterile barriers used   Attempts: 1 Procedure performed using ultrasound guided technique. Following insertion, dressing applied and Biopatch. Post procedure assessment: normal and unchanged  Patient tolerated the procedure well with no immediate complications.

## 2023-08-29 LAB — CBC
HCT: 31.1 % — ABNORMAL LOW (ref 39.0–52.0)
Hemoglobin: 10.7 g/dL — ABNORMAL LOW (ref 13.0–17.0)
MCH: 30.2 pg (ref 26.0–34.0)
MCHC: 34.4 g/dL (ref 30.0–36.0)
MCV: 87.9 fL (ref 80.0–100.0)
Platelets: 215 K/uL (ref 150–400)
RBC: 3.54 MIL/uL — ABNORMAL LOW (ref 4.22–5.81)
RDW: 13.3 % (ref 11.5–15.5)
WBC: 11.7 K/uL — ABNORMAL HIGH (ref 4.0–10.5)
nRBC: 0 % (ref 0.0–0.2)

## 2023-08-29 LAB — BASIC METABOLIC PANEL WITH GFR
Anion gap: 5 (ref 5–15)
BUN: 13 mg/dL (ref 8–23)
CO2: 28 mmol/L (ref 22–32)
Calcium: 8.5 mg/dL — ABNORMAL LOW (ref 8.9–10.3)
Chloride: 102 mmol/L (ref 98–111)
Creatinine, Ser: 0.77 mg/dL (ref 0.61–1.24)
GFR, Estimated: >60 mL/min (ref >60)
Glucose, Bld: 148 mg/dL — ABNORMAL HIGH (ref 70–99)
Potassium: 4.1 mmol/L (ref 3.5–5.1)
Sodium: 135 mmol/L (ref 135–145)

## 2023-08-29 LAB — TSH: TSH: 1.367 u[IU]/mL (ref 0.350–4.500)

## 2023-08-29 LAB — T4, FREE: Free T4: 1.05 ng/dL (ref 0.61–1.12)

## 2023-08-29 LAB — LACTIC ACID, PLASMA: Lactic Acid, Venous: 0.8 mmol/L (ref 0.5–1.9)

## 2023-08-29 LAB — MAGNESIUM: Magnesium: 1.8 mg/dL (ref 1.7–2.4)

## 2023-08-29 LAB — GLUCOSE, CAPILLARY: Glucose-Capillary: 119 mg/dL — ABNORMAL HIGH (ref 70–99)

## 2023-08-29 MED ORDER — SUGAMMADEX SODIUM 200 MG/2ML IV SOLN
INTRAVENOUS | Status: DC | PRN
  Administered 2023-08-29: 200 mg via INTRAVENOUS

## 2023-08-29 MED ORDER — LABETALOL HCL 5 MG/ML IV SOLN
INTRAVENOUS | Status: AC
  Filled 2023-08-29: qty 4

## 2023-08-29 MED ORDER — CLEVIDIPINE BUTYRATE 0.5 MG/ML IV EMUL
0.0000 mg/h | INTRAVENOUS | Status: DC
  Administered 2023-08-29: 2 mg/h via INTRAVENOUS
  Filled 2023-08-29 (×2): qty 50

## 2023-08-29 MED ORDER — ONDANSETRON HCL 4 MG/2ML IJ SOLN
4.0000 mg | Freq: Four times a day (QID) | INTRAMUSCULAR | Status: DC | PRN
Start: 2023-08-29 — End: 2023-08-30

## 2023-08-29 MED ORDER — CEFAZOLIN SODIUM-DEXTROSE 2-4 GM/100ML-% IV SOLN
2.0000 g | Freq: Three times a day (TID) | INTRAVENOUS | Status: AC
  Administered 2023-08-29 (×2): 2 g via INTRAVENOUS
  Filled 2023-08-29 (×2): qty 100

## 2023-08-29 MED ORDER — HYDROCHLOROTHIAZIDE 25 MG PO TABS
25.0000 mg | ORAL_TABLET | Freq: Every day | ORAL | Status: DC
  Administered 2023-08-29 – 2023-08-30 (×2): 25 mg via ORAL
  Filled 2023-08-29 (×2): qty 1

## 2023-08-29 MED ORDER — ASPIRIN 81 MG PO TBEC
81.0000 mg | DELAYED_RELEASE_TABLET | Freq: Every day | ORAL | Status: DC
  Administered 2023-08-30: 81 mg via ORAL
  Filled 2023-08-29: qty 1

## 2023-08-29 MED ORDER — PHENOL 1.4 % MT LIQD: 1.0000 | OROMUCOSAL | Status: DC | PRN

## 2023-08-29 MED ORDER — ACETAMINOPHEN 325 MG PO TABS: 325.0000 mg | ORAL_TABLET | ORAL | Status: DC | PRN

## 2023-08-29 MED ORDER — FENTANYL CITRATE (PF) 100 MCG/2ML IJ SOLN
25.0000 ug | INTRAMUSCULAR | Status: DC | PRN
  Administered 2023-08-29 (×2): 50 ug via INTRAVENOUS

## 2023-08-29 MED ORDER — CHLORHEXIDINE GLUCONATE CLOTH 2 % EX PADS
6.0000 | MEDICATED_PAD | Freq: Every day | CUTANEOUS | Status: DC
Start: 2023-08-29 — End: 2023-08-30
  Administered 2023-08-29: 6 via TOPICAL

## 2023-08-29 MED ORDER — DOCUSATE SODIUM 100 MG PO CAPS
100.0000 mg | ORAL_CAPSULE | Freq: Every day | ORAL | Status: DC
  Administered 2023-08-29 – 2023-08-30 (×2): 100 mg via ORAL
  Filled 2023-08-29 (×2): qty 1

## 2023-08-29 MED ORDER — ATORVASTATIN CALCIUM 20 MG PO TABS
20.0000 mg | ORAL_TABLET | Freq: Every day | ORAL | Status: DC
  Administered 2023-08-29 – 2023-08-30 (×2): 20 mg via ORAL
  Filled 2023-08-29 (×2): qty 1

## 2023-08-29 MED ORDER — NICOTINE 21 MG/24HR TD PT24
21.0000 mg | MEDICATED_PATCH | Freq: Every day | TRANSDERMAL | Status: DC
  Administered 2023-08-29 – 2023-08-30 (×2): 21 mg via TRANSDERMAL
  Filled 2023-08-29 (×2): qty 1

## 2023-08-29 MED ORDER — SODIUM CHLORIDE 0.9 % IV SOLN: 500.0000 mL | Freq: Once | INTRAVENOUS | Status: DC | PRN

## 2023-08-29 MED ORDER — FENTANYL CITRATE (PF) 100 MCG/2ML IJ SOLN
INTRAMUSCULAR | Status: AC
  Filled 2023-08-29: qty 2

## 2023-08-29 MED ORDER — DOCUSATE SODIUM 100 MG PO CAPS: 100.0000 mg | ORAL_CAPSULE | Freq: Every day | ORAL | Status: DC

## 2023-08-29 MED ORDER — LABETALOL HCL 5 MG/ML IV SOLN
10.0000 mg | INTRAVENOUS | Status: DC | PRN
  Administered 2023-08-29: 10 mg via INTRAVENOUS

## 2023-08-29 MED ORDER — HYDRALAZINE HCL 20 MG/ML IJ SOLN
5.0000 mg | INTRAMUSCULAR | Status: DC | PRN
  Filled 2023-08-29: qty 1

## 2023-08-29 MED ORDER — MAGNESIUM SULFATE 2 GM/50ML IV SOLN
2.0000 g | Freq: Once | INTRAVENOUS | Status: AC
  Administered 2023-08-29: 2 g via INTRAVENOUS
  Filled 2023-08-29: qty 50

## 2023-08-29 MED ORDER — OXYCODONE HCL 5 MG PO TABS
5.0000 mg | ORAL_TABLET | ORAL | Status: DC | PRN
  Administered 2023-08-29 (×2): 10 mg via ORAL
  Administered 2023-08-29 (×2): 5 mg via ORAL
  Administered 2023-08-29 – 2023-08-30 (×4): 10 mg via ORAL
  Filled 2023-08-29 (×4): qty 2
  Filled 2023-08-29: qty 1
  Filled 2023-08-29 (×3): qty 2

## 2023-08-29 MED ORDER — NITROGLYCERIN IN D5W 200-5 MCG/ML-% IV SOLN
5.0000 ug/min | INTRAVENOUS | Status: DC
  Administered 2023-08-29: 60 ug/min via INTRAVENOUS
  Filled 2023-08-29: qty 250

## 2023-08-29 MED ORDER — METOPROLOL TARTRATE 5 MG/5ML IV SOLN: 2.5000 mg | INTRAVENOUS | Status: DC | PRN

## 2023-08-29 MED ORDER — OXYCODONE HCL 5 MG PO TABS: 5.0000 mg | ORAL_TABLET | Freq: Once | ORAL | Status: DC | PRN

## 2023-08-29 MED ORDER — OXYCODONE HCL 5 MG/5ML PO SOLN: 5.0000 mg | Freq: Once | ORAL | Status: DC | PRN

## 2023-08-29 MED ORDER — POTASSIUM CHLORIDE CRYS ER 20 MEQ PO TBCR: 40.0000 meq | EXTENDED_RELEASE_TABLET | Freq: Every day | ORAL | Status: DC | PRN

## 2023-08-29 MED ORDER — ACETAMINOPHEN 650 MG RE SUPP: 325.0000 mg | RECTAL | Status: DC | PRN

## 2023-08-29 MED ORDER — NITROGLYCERIN IN D5W 200-5 MCG/ML-% IV SOLN
INTRAVENOUS | Status: DC | PRN
  Administered 2023-08-29: 10 ug/min via INTRAVENOUS

## 2023-08-29 MED ORDER — AMLODIPINE BESYLATE 10 MG PO TABS
10.0000 mg | ORAL_TABLET | Freq: Every day | ORAL | Status: DC
  Administered 2023-08-29 – 2023-08-30 (×2): 10 mg via ORAL
  Filled 2023-08-29 (×2): qty 1

## 2023-08-29 NOTE — Progress Notes (Signed)
 1 Day Post-Op   Subjective/Chief Complaint: Feels better this morning. Appropriate right neck pain. Denies difficulty swallowing or airway issues. Remains hypertensive now on Cleviprex  gtt. Agreed to Nicotine  Patch. States he will go home tomorrow no matter what.   Objective: Vital signs in last 24 hours: Temp:  [96.9 F (36.1 C)-98.8 F (37.1 C)] 98.5 F (36.9 C) (07/20 0825) Pulse Rate:  [42-100] 78 (07/20 0825) Resp:  [10-30] 12 (07/20 0825) BP: (102-179)/(47-99) 135/76 (07/20 0825) SpO2:  [93 %-100 %] 99 % (07/20 0825) Arterial Line BP: (123-227)/(46-92) 154/62 (07/20 0825) Weight:  [61.7 kg] 61.7 kg (07/19 1912)    Intake/Output from previous day: 07/19 0701 - 07/20 0700 In: 2000 [I.V.:2000] Out: 750 [Urine:500; Blood:250] Intake/Output this shift: Total I/O In: 411.8 [P.O.:360; I.V.:51.8] Out: -   General appearance: alert and no distress Neck: supple, symmetrical, trachea midline and RIGHT neck- soft, dressing- C/D/I Cardio: regular rate and rhythm Neurologic: Alert and oriented X 3, normal strength and tone. Normal symmetric reflexes. Normal coordination and gait  Lab Results:  Recent Labs    08/28/23 1916 08/29/23 0150  WBC 8.2 11.7*  HGB 12.9* 10.7*  HCT 38.8* 31.1*  PLT 254 215   BMET Recent Labs    08/28/23 1916 08/29/23 0150  NA 139 135  K 4.1 4.1  CL 99 102  CO2 30 28  GLUCOSE 94 148*  BUN 13 13  CREATININE 0.74 0.77  CALCIUM  9.0 8.5*   PT/INR Recent Labs    08/28/23 1916  LABPROT 13.6  INR 1.0   ABG No results for input(s): PHART, HCO3 in the last 72 hours.  Invalid input(s): PCO2, PO2  Studies/Results: CT Angio Neck W and/or Wo Contrast Result Date: 08/28/2023 EXAM: CTA Neck 08/28/2023 07:48:30 PM TECHNIQUE: CT of the neck was performed with the administration of intravenous contrast. Multiplanar 2D and/or 3D reformatted images are provided for review. Automated exposure control, iterative reconstruction, and/or weight  based adjustment of the mA/kV was utilized to reduce the radiation dose to as low as reasonably achievable. Stenosis of the internal carotid arteries measured using NASCET criteria. COMPARISON: 05/27/2023 CLINICAL HISTORY: 2 days out from right carotid endarterectomy. Local swelling, pain and bleeding. Evaluate abscess, leaking bovine graft. FINDINGS: AORTIC ARCH AND ARCH VESSELS: No dissection or arterial injury. Aberrant right subclavian artery. No significant stenosis of the brachiocephalic or subclavian arteries. CERVICAL CAROTID ARTERIES: Postsurgical changes of right carotid endarterectomy. Despite the large hematoma, there is no visualized active extravasation from the right carotid system. 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 CERVICAL VERTEBRAL ARTERIES: No dissection, arterial injury, or significant stenosis. LUNGS AND MEDIASTINUM: Mild biapical emphysema. SOFT TISSUES: Large hematoma in the right neck originating in the carotid space and measuring 4.4 x 3.0 x 7.0 cm. BONES: No acute abnormality. IMPRESSION: 1. Large hematoma in the right neck originating in the carotid space, measuring 4.4 x 3.0 x 7.0 cm, without active extravasation from the right carotid system. 2. 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 Electronically signed by: Franky Stanford MD 08/28/2023 08:53 PM EDT RP Workstation: [id number]   DG Chest Port 1 View Result Date: 08/28/2023 CLINICAL DATA:  Questionable sepsis - evaluate for abnormality EXAM: PORTABLE CHEST - 1 VIEW COMPARISON:  06/04/2023 FINDINGS: Lungs are clear. Persistent blunting of the right lateral costophrenic angle. Heart  size and mediastinal contours are within normal limits. No effusion. Visualized bones unremarkable. IMPRESSION: No acute cardiopulmonary disease. Electronically Signed   By: JONETTA Faes M.D.   On:  08/28/2023 20:01    Anti-infectives: Anti-infectives (From admission, onward)    Start     Dose/Rate Route Frequency Ordered Stop   08/29/23 0700  ceFAZolin  (ANCEF ) IVPB 2g/100 mL premix        2 g 200 mL/hr over 30 Minutes Intravenous Every 8 hours 08/29/23 0124 08/29/23 2359       Assessment/Plan: s/p Procedure(s): exacuation of hematoma and repair of arterial branch (Right) Continue Cleviprex ; begin PO regimen for HTN Continue ASA; Atorvastatin  Neuro- checks Nicotine  Patch May remove dressing tomorrow  Appreciate Intensivists input and care.  LOS: 1 day    Tisa Dakin A 08/29/2023

## 2023-08-29 NOTE — Anesthesia Postprocedure Evaluation (Signed)
 Anesthesia Post Note  Patient: Alvin Earl Farver Jr.  Procedure(s) Performed: exacuation of hematoma and repair of arterial branch (Right)  Patient location during evaluation: SICU Anesthesia Type: General Level of consciousness: awake and alert and oriented Pain management: pain level controlled Vital Signs Assessment: post-procedure vital signs reviewed and stable Respiratory status: spontaneous breathing, respiratory function stable and patient connected to nasal cannula oxygen Cardiovascular status: stable Postop Assessment: no apparent nausea or vomiting Anesthetic complications: no   No notable events documented.   Last Vitals:  Vitals:   08/29/23 0800 08/29/23 0825  BP:  135/76  Pulse: (!) 42 78  Resp: 10 12  Temp: 37.1 C 36.9 C  SpO2:  99%    Last Pain:  Vitals:   08/29/23 0900  TempSrc:   PainSc: 8                  Prentice Murphy

## 2023-08-29 NOTE — Plan of Care (Signed)

## 2023-08-29 NOTE — Op Note (Signed)
 [facility]   OPERATIVE NOTE  PROCEDURE:   1.  RIGHT neck exploration/evacuation of hematoma, suture ligation arterial branch-soft tissue s/p carotid endarterectomy with bovine pericardial patch reconstruction  PRE-OPERATIVE DIAGNOSIS: 1.  Bleeding/hematoma- right neck- s/p right carotid endarterectomy   POST-OPERATIVE DIAGNOSIS: same as above   SURGEON: Curry Beat, MD  ASSISTANT(S): none  ANESTHESIA: general  ESTIMATED BLOOD LOSS: 15ml; 225ml-hemaoma  FINDING(S): 1.  Right neck hematoma.  The right carotid endarterectomy patch was intact without evidence of bleeding.  The entire common carotid, external and internal carotid arteries were examined and there was no evidence of bleeding and appeared to be widely patent.  However there was a small arterial branch within the soft tissue medially with active bleeding.  SPECIMEN(S): None  INDICATIONS:   Alvin Gonzalez. is a 63 y.o. male who presented with right neck hematoma and drainage from the distal portion of the wound.  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  The patient left AMA postoperative day #2 while still being hypertensive and on nitroglycerin  drip in the ICU.  He presented back to the ER this evening with increase in the size of the neck hematoma, serosanguineous drainage and progressive difficulty swallowing.   I discussed with the patient the risks, benefits, and alternatives to proceeding with hematoma evacuation and exploration..  I explained the risks include but are not limited to: bleeding, infection, stroke, myocardial infarction, death, cranial nerve injuries both temporary and permanent, neck hematoma, possible airway compromise, labile blood pressure post-operatively, cerebral hyperperfusion syndrome, and possible need for additional interventions in the future. The patient is aware of the risks  and agrees to proceed forward with the procedure.  DESCRIPTION: After full informed written consent was obtained from the patient, the patient was brought back to the operating room and placed supine upon the operating table.  Prior to induction, the patient received IV antibiotics.  After obtaining adequate anesthesia, the patient was placed into a modified beach chair position with a shoulder roll in place and the patient's neck slightly hyperextended and rotated away from the surgical site.  The patient was prepped in the standard fashion for a carotid endarterectomy.  The prior surgical incision was opened using a #15 blade and Metzenbaum scissors.  Upon opening the wound there was a large hematoma that was gently evacuated.  Again with Chelle careful exploration and warm saline the remainder of the hematoma was removed.  I was able to identify the common carotid internal carotid and external carotid arteries.  The patch was intact without evidence of bleeding from any of the aforementioned.  Continued exploration medially and a area of hematoma revealed a small actively bleeding arterial branch from medial soft tissues.  I then used a 6-0 Prolene to obtain hemostasis.  I continued to explore the wound and did not identify any further evidence of bleeding.  There was excellent hemostasis within the wound.  At this point, I placed Surgicel and Evicel topical hemostatic agents.  There was no more active bleeding in the surgical wound..  The sternocleidomastoid space was closed with three interrupted 3-0 Vicryl sutures. I then reapproximated the platysma muscle with a running stitch of 3-0 Vicryl.  The skin was then closed with a running subcuticular 4-0 Monocryl.  The skin was then cleaned, dried and Dermabond was used to reinforce the skin closure.  Dry sterile dressing was then placed.  The patient awakened and was taken to the recovery room in stable condition, following commands and moving all four  extremities without any apparent deficits.    COMPLICATIONS: none  CONDITION: stable  Tareq Dwan A  08/29/2023, 12:33 AM    This note was created with Dragon Medical transcription system. Any errors in dictation are purely unintentional.

## 2023-08-29 NOTE — Consult Note (Addendum)
 NAME:  Alvin Gonzalez., MRN:  [id number], DOB:  16-Jul-1960, LOS: 1 ADMISSION DATE:  08/28/2023, CONSULTATION DATE:  08/29/23 REFERRING MD: Tisa, REASON FOR CONSULT: Right neck Hematoma    HPI  63 Y.O male with significant PMH of polysubstance abuse (tobacco, cocaine, marijuana), HTN, COPD, GI bleed, GERD, anxiety and depression, BPH, PTSD, adrenal insufficiency, chronic pain syndrome, medication noncompliance, and CVA who presented to the ED with chief complaints of  Per chart review, patient was seen in the ED on 05/18/23 with strokelike symptoms.  CT head obtained which showed lacunar infarct but he left AMA prior to stroke workup.  He returned to the ED on 05/27/2023 again with strokelike symptoms, this time he was hospitalized and stroke workup revealed acute ischemic stroke of the left basal ganglia and right internal carotid artery stenosis.  He was discharged to follow-up with vascular for further evaluation of the high-grade right carotid stenosis.  Patient was seen by vascular on 06/25/2023 who recommended endarterectomy for stroke prevention which was scheduled for 08/26/2023.  Patient had RIGHT Carotid endarterectomy on 08/26/2023. Postoperatively the patient required nitroglycerin  gtt for elevated blood pressure. On POD #1 the patient left AMA to smoke and went home. He returns to the ED with progressive swelling and drainage from the right neck incision    ED Course: Initial vital signs showed HR of 93 beats/minute, BP 165/99 mm Hg, the RR 18 breaths/minute, and the oxygen saturation 99 % on RA and a temperature of 98.43F (36.9C). Pertinent Labs unremarkable. 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  Vascular was consulted and patient taken emergently to the OR for hematoma evacuation.  Patient transferred to the ICU postop for BP control and continued monitoring.  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 7/20: Transferred to ICU postop for BP control and management.  PCCM consulted  Consults:  PCCM Vascular  Procedures:  7/19:right neck hematoma s/p RIGHT neck Hematoma evacuation and carotid exploration.   Interim History / Subjective:    -  Micro Data:  7/19: Blood culture x2> 7/20: MRSA PCR>>   Antimicrobials:  None  OBJECTIVE  Blood pressure (!) 165/99, pulse 100, temperature 98.8 F (37.1 C), temperature source Oral, resp. rate (!) 30, height 5' 5 (1.651 m), weight 61.7 kg, SpO2 93%.        Intake/Output Summary (Last 24 hours) at 08/29/2023 0001 Last data filed at 08/28/2023 2354 Gross per 24 hour  Intake 1700 ml  Output 250 ml  Net 1450 ml   Filed Weights   08/28/23 1912  Weight: 61.7 kg   Physical Examination  GENERAL:63 year-old critically ill patient lying in the bed  EYES: PEERLA. No scleral icterus. Extraocular muscles intact.  HEENT: Head atraumatic, normocephalic. Oropharynx and nasopharynx clear. RIGHT neck dressing wound dressing intact CARDIOVASCULAR:S1, S2 normal. No murmurs, rubs, or gallops.   PULMONARY: Lungs course with mild wheezes ABDOMINAL: Soft, NTND MUSCULOSKELETAL: No swelling. Normal range of motion.  NEUROLOGICAL: General: No focal deficit present. She is alert and oriented to person, place, and time baseline.  PSYCHIATRIC:  Mood  and Affect: Mood normal.  SKIN:Skin is warm and dry. No obvious rash, lesion, or ulcer. Capillary Refill:< 2sec   Labs/imaging that I havepersonally reviewed  (right click and Reselect all SmartList Selections  daily)    CT Angio Neck W and/or Wo Contrast Result Date: 08/28/2023 EXAM: CTA Neck 08/28/2023 07:48:30 PM TECHNIQUE: CT of the neck was performed with the administration of intravenous contrast. Multiplanar 2D and/or 3D reformatted images are provided for review. Automated exposure control, iterative reconstruction, and/or weight based adjustment of the mA/kV was utilized to reduce the radiation dose to as low as reasonably achievable. Stenosis of the internal carotid arteries measured using NASCET criteria. COMPARISON: 05/27/2023 CLINICAL HISTORY: 2 days out from right carotid endarterectomy. Local swelling, pain and bleeding. Evaluate abscess, leaking bovine graft. FINDINGS: AORTIC ARCH AND ARCH VESSELS: No dissection or arterial injury. Aberrant right subclavian artery. No significant stenosis of the brachiocephalic or subclavian arteries. CERVICAL CAROTID ARTERIES: Postsurgical changes of right carotid endarterectomy. Despite the large hematoma, there is no visualized active extravasation from the right carotid system. 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 CERVICAL VERTEBRAL ARTERIES: No dissection, arterial injury, or significant stenosis. LUNGS AND MEDIASTINUM: Mild biapical emphysema. SOFT TISSUES: Large hematoma in the right neck originating in the carotid space and measuring 4.4 x 3.0 x 7.0 cm. BONES: No acute abnormality. IMPRESSION: 1. Large hematoma in the right neck originating in the carotid space, measuring 4.4 x 3.0 x 7.0 cm, without active extravasation from the right carotid system. 2. 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 Electronically signed by: Franky Stanford MD 08/28/2023 08:53 PM EDT RP Workstation: [id number]   DG Chest Port 1 View Result Date: 08/28/2023 CLINICAL DATA:  Questionable sepsis - evaluate for abnormality EXAM: PORTABLE  CHEST - 1 VIEW COMPARISON:  06/04/2023 FINDINGS: Lungs are clear. Persistent blunting of the right lateral costophrenic angle. Heart size and mediastinal contours are within normal limits. No effusion. Visualized bones unremarkable. IMPRESSION: No acute cardiopulmonary disease. Electronically Signed   By: JONETTA Faes M.D.   On: 08/28/2023 20:01    Labs   CBC: Recent Labs  Lab 08/27/23 0548 08/28/23 1916  WBC 9.5 8.2  NEUTROABS  --  5.4  HGB 11.6* 12.9*  HCT 34.6* 38.8*  MCV 87.8 89.4  PLT 260 254   Basic Metabolic Panel: Recent Labs  Lab 08/27/23 0548 08/28/23 1916  NA 135 139  K 3.1* 4.1  CL 100 99  CO2 27 30  GLUCOSE 112* 94  BUN 13 13  CREATININE 0.83 0.74  CALCIUM  8.6* 9.0   GFR: Estimated Creatinine Clearance: 84.3 mL/min (by C-G formula based on SCr of 0.74 mg/dL). Recent Labs  Lab 08/27/23 0548 08/28/23 1916  WBC 9.5 8.2  LATICACIDVEN  --  1.5   Liver Function Tests: Recent Labs  Lab 08/28/23 1916  AST 20  ALT 11  ALKPHOS 72  BILITOT 0.7  PROT 7.3  ALBUMIN  3.8   No results for input(s): LIPASE, AMYLASE in the last 168 hours. No results for input(s): AMMONIA in the last 168 hours.  ABG    Component Value Date/Time   PHART 7.513 (H) 02/19/2014 0500   PCO2ART 33.6 (L) 02/19/2014 0500   PO2ART 148.0 (H) 02/19/2014 0500   HCO3 26.8 (H) 02/19/2014 0500   TCO2 27.8 02/19/2014 0500  ACIDBASEDEF 5.7 (H) 02/18/2014 1210   O2SAT 99.1 02/19/2014 0500    Coagulation Profile: Recent Labs  Lab 08/28/23 1916  INR 1.0   Cardiac Enzymes: No results for input(s): CKTOTAL, CKMB, CKMBINDEX, TROPONINI in the last 168 hours.  HbA1C: Hgb A1c MFr Bld  Date/Time Value Ref Range Status  05/27/2023 05:16 PM 5.2 4.8 - 5.6 % Final    Comment:    (NOTE) Pre diabetes:          5.7%-6.4%  Diabetes:              >6.4%  Glycemic control for   <7.0% adults with diabetes   01/23/2021 07:18 PM 5.9 (H) 4.8 - 5.6 % Final    Comment:              Prediabetes: 5.7 - 6.4          Diabetes: >6.4          Glycemic control for adults with diabetes: <7.0    CBG: Recent Labs  Lab 08/26/23 1111  GLUCAP 116*   Review of Systems:   Unable to be obtained secondary to the patient sedated post anesthesia  Past Medical History  He,  has a past medical history of Acute hypoxemic respiratory failure (HCC) (07/27/2021), Adrenal insufficiency (HCC), Anxiety, Aortic atherosclerosis (HCC), BPH (benign prostatic hyperplasia), Cancer (HCC), Cerebral microvascular disease, Cerebrovascular accident (CVA) of left basal ganglia (HCC) (05/27/2023), Cervical disc disease with myelopathy (07/29/2021), Chronic back pain, 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, with delusions and tactile/visual hallucinations (04/20/2022), COPD (chronic obstructive pulmonary disease) (HCC) (05/27/2023), Depression, Diastolic dysfunction, Emphysema of lung (HCC), Erectile dysfunction (03/26/2017), GERD (gastroesophageal reflux disease), Hemorrhoids, HLD (hyperlipidemia), Hypertension, Intermittent explosive disorder, Left sided lacunar infarction (HCC) (05/18/2023), Polysubstance abuse (HCC), Prediabetes (02/20/2021), PTSD (post-traumatic stress disorder), Pulmonary nodule (12/10/2016), Right-sided carotid artery disease (HCC), Risk for falls, Stenosis of right carotid artery (05/28/2023), and Suicide attempt (HCC).   Surgical History    Past Surgical History:  Procedure Laterality Date   CHEST TUBE INSERTION     COLONOSCOPY WITH PROPOFOL  N/A 05/14/2021   Procedure: COLONOSCOPY WITH PROPOFOL ;  Surgeon: Therisa Bi, MD;  Location: [facility] ENDOSCOPY;  Service: Gastroenterology;  Laterality: N/A;   ENDARTERECTOMY Right 08/26/2023   Procedure: ENDARTERECTOMY, CAROTID;  Surgeon: Marea Selinda RAMAN, MD;  Location: [facility] ORS;  Service: Vascular;  Laterality: Right;   EVALUATION UNDER ANESTHESIA WITH HEMORRHOIDECTOMY N/A 09/10/2021    Procedure: EXAM UNDER ANESTHESIA WITH HEMORRHOIDECTOMY of 2+ columns;  Surgeon: Desiderio Schanz, MD;  Location: [facility] ORS;  Service: General;  Laterality: N/A;  Provider requesting 1.5 hours / 90 minutes for procedure.   LYMPH NODE BIOPSY       Social History   reports that he has been smoking cigarettes. He has a 45 pack-year smoking history. He quit smokeless tobacco use about 8 years ago. He reports current alcohol use. He reports current drug use. Drugs: Marijuana and Cocaine.   Family History   His family history includes Cirrhosis in his father; Diabetes in his mother; Heart attack in his maternal grandmother; Heart disease in his father; Hypertension in his father and mother; Liver cancer in his father; Lung cancer in his sister; Other in his mother and paternal grandmother; Stomach cancer in his cousin; Suicidality in his maternal grandfather and paternal grandfather.   Allergies Allergies  Allergen Reactions   Carbamazepine Rash and Hives   Ibuprofen Nausea And Vomiting, Rash and Other (See Comments)  GI Bleeding    Risperidone  Swelling    Tongue swelling   Naproxen  Other (See Comments)    GI bleeding     Home Medications  Prior to Admission medications   Medication Sig Start Date End Date Taking? Authorizing Provider  albuterol  (VENTOLIN  HFA) 108 (90 Base) MCG/ACT inhaler Inhale 2 inhalations into the lungs every 6 (six) hours as needed 11/11/21     amLODipine  (NORVASC ) 10 MG tablet Take 1 tablet (10 mg total) by mouth daily. 05/29/23   Laurita Pillion, MD  aspirin  EC 81 MG tablet Take 1 tablet (81 mg total) by mouth daily. Swallow whole. 05/29/23 05/28/24  Laurita Pillion, MD  atorvastatin  (LIPITOR) 80 MG tablet Take 1 tablet (80 mg total) by mouth daily. Patient not taking: Reported on 08/23/2023 05/30/23   Laurita Pillion, MD  hydrochlorothiazide  (HYDRODIURIL ) 25 MG tablet Take 1 tablet by mouth daily. 06/01/23 05/31/24  [provider]  DULoxetine  (CYMBALTA ) 30 MG capsule Take 1  capsule (30 mg total) by mouth daily for 7 days, THEN 2 capsules (60 mg total) daily 10/23/21 01/12/22    Scheduled Meds: Continuous Infusions: PRN Meds:.  Active Hospital Problem list   See systems below  Assessment & Plan:  #Right Carotid Stenosis s/p Endarterectomy on 08/26/2023. POD # 1 Left AMA now with right neck hematoma s/p RIGHT neck Hematoma evacuation and carotid exploration.  -Bp control with Cleviprex  gtt goal SBP<140 -Frequent neurochecks -Monitor s&s bleeding or infection at site -Follow cbc -Vascular following  #HTN #HLD #Hx of recent CVA -Continue Aspirin  81 mg -Continue Atorvastatin  20 mg -Continue amlodipine  and hydrochlorothiazide  once able to take.  #Emphysema/COPD without exacerbation  -Supplemental O2 for dyspnea and/or hypoxia  -Maintain O2 sats 88% to 92% -Scheduled and prn bronchodilator therapy  -Intermittent CXR  -Smoking cessation counseling  #Chronic Pain Syndrome #Anxiety and depression #PTSD #Polysubstance Abuse (Cocaine, tobacco and Marijuana) UDS+ cocaine -Provide nicotine  patch -prn Oxy  -Polysubstance abuse counseling stable  #Adrenal Insufficiency -Hold steroids iso hypertension -check TSH, Free T4, Cortisol    Best practice:  Diet:  NPO Pain/Anxiety/Delirium protocol (if indicated): No VAP protocol (if indicated): Not indicated DVT prophylaxis: Contraindicated GI prophylaxis: PPI Glucose control:  SSI No Central venous access:  N/A Arterial line:  Yes, and it is still needed Foley:  Yes, and it is still needed Mobility:  bed rest  PT consulted: N/A Last date of multidisciplinary goals of care discussion []  Code Status:  full code Disposition: ICU   = Goals of Care = Code Status Order: FULL   Wishes to pursue full aggressive treatment and intervention options, including CPR and intubation, but goals of care will be addressed on going with family if that should become necessary.  Critical care time: 45 minutes         Almarie Nose DNP, CCRN, FNP-C, AGACNP-BC Acute Care & Family Nurse Practitioner [facility] PCCM on call pager [phone]

## 2023-08-29 NOTE — Progress Notes (Signed)
 eLink Physician-Brief Progress Note Patient Name: Alvin Gonzalez. DOB: 02-09-1961 MRN: [id number]   Date of Service  08/29/2023  HPI/Events of Note  39 M hx of PTSD< chronic pain, substance abuse, adrenal insufficiency, CVA with R ICA stenosis s/p elective CEA 7/17. Post op complicated by hypertension requiring NTG drip but he left AMA as we wasn't not allowed to smoke in the hospital. . He returned 7/19 with swelling on surgical site with worsening of odynophagia   eICU Interventions  He is now s/p right neck exploration with evacuation of hematoma Monitor for signs of bleed BP control Apply nicotine  patch, patient agreed Discussed with BSRN     Intervention Category Evaluation Type: New Patient Evaluation  Alvin Gonzalez Alvin Gonzalez 08/29/2023, 1:14 AM

## 2023-08-29 NOTE — Consult Note (Incomplete)
 NAME:  Alvin Lederman., MRN:  [id number], DOB:  1960/10/28, LOS: 1 ADMISSION DATE:  08/28/2023, CONSULTATION DATE:  *** REFERRING MD:  ***, CHIEF COMPLAINT:  ***    HPI  63 Y.O male with significant PMH of who presented to the ED with chief complaints of    ED Course: Initial vital signs showed HR of beats/minute, BP mm Hg, the RR 30 breaths/minute, and the oxygen saturation % on and a temperature of 98.54F (36.9C). Pertinent Labs/Diagnostics Findings: Na+/ K+:  Glucose: BUN/Cr.:Calcium :  AST/ALT: WBC: K/L without bands or neutrophil predominance   Hgb/Hct: Plts:  PCT: negative <0.10  Lactic acid:  COVID PCR: Negative,  troponin:   BNP:   ABG: pO2 ***; pCO2 ***; pH ***;  HCO3 ***, %O2 Sat ***.  CXR> CTH> CTA Chest> CT Abd/pelvis> Medication administered in the ED: Disposition:  Past Medical History  ***  Significant Hospital Events   ***  Consults:  ***  Procedures:  ***  Interim History / Subjective:      Micro Data:  : SARS-CoV-2 PCR> negative : Influenza PCR> negative : Blood culture x2> : Urine Culture> : MRSA PCR>>  : Strep pneumo urinary antigen> : Legionella urinary antigen>  Antimicrobials:  Vancomycin Cefepime Azithromycin Ceftriaxone  Metronidazole   OBJECTIVE  Blood pressure (!) 165/99, pulse 100, temperature 98.8 F (37.1 C), temperature source Oral, resp. rate (!) 30, height 5' 5 (1.651 m), weight 61.7 kg, SpO2 93%.        Intake/Output Summary (Last 24 hours) at 08/29/2023 0001 Last data filed at 08/28/2023 2354 Gross per 24 hour  Intake 1700 ml  Output 250 ml  Net 1450 ml   Filed Weights   08/28/23 1912  Weight: 61.7 kg    Physical Examination  GENERAL: 63 year-old critically ill patient lying in the bed  EYES: PEERLA. No scleral icterus. Extraocular muscles intact.  HEENT: Head atraumatic, normocephalic. Oropharynx and nasopharynx clear.  CARDIOVASCULAR:S1, S2 normal. No murmurs, rubs, or gallops.  Intermittently  tachycardic PULMONARY: Lungs course with wheezes ABDOMINAL: Soft, NTND MUSCULOSKELETAL: No swelling. Normal range of motion. 4/5 strength in all extremities.   NEUROLOGICAL: General: No focal deficit present. She is alert and oriented to person, place, and time baseline.  PSYCHIATRIC:  Mood and Affect: Mood normal.  SKIN:Skin is warm and dry. No obvious rash, lesion, or ulcer. Capillary Refill:< 2sec  Labs/imaging that I {ACTIONS; HAVE/HAVE NOT:19434}personally reviewed  (right click and Reselect all SmartList Selections daily)  CT Angio Neck W and/or Wo Contrast Result Date: 08/28/2023 EXAM: CTA Neck 08/28/2023 07:48:30 PM TECHNIQUE: CT of the neck was performed with the administration of intravenous contrast. Multiplanar 2D and/or 3D reformatted images are provided for review. Automated exposure control, iterative reconstruction, and/or weight based adjustment of the mA/kV was utilized to reduce the radiation dose to as low as reasonably achievable. Stenosis of the internal carotid arteries measured using NASCET criteria. COMPARISON: 05/27/2023 CLINICAL HISTORY: 2 days out from right carotid endarterectomy. Local swelling, pain and bleeding. Evaluate abscess, leaking bovine graft. FINDINGS: AORTIC ARCH AND ARCH VESSELS: No dissection or arterial injury. Aberrant right subclavian artery. No significant stenosis of the brachiocephalic or subclavian arteries. CERVICAL CAROTID ARTERIES: Postsurgical changes of right carotid endarterectomy. Despite the large hematoma, there is no visualized active extravasation from the right carotid system. 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 CERVICAL VERTEBRAL ARTERIES: No dissection, arterial injury, or significant  stenosis. LUNGS AND MEDIASTINUM: Mild biapical emphysema. SOFT TISSUES: Large hematoma in the right neck originating in the carotid space and measuring 4.4 x 3.0 x 7.0 cm. BONES: No  acute abnormality. IMPRESSION: 1. Large hematoma in the right neck originating in the carotid space, measuring 4.4 x 3.0 x 7.0 cm, without active extravasation from the right carotid system. 2. 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 Electronically signed by: Franky Stanford MD 08/28/2023 08:53 PM EDT RP Workstation: [id number]   DG Chest Port 1 View Result Date: 08/28/2023 CLINICAL DATA:  Questionable sepsis - evaluate for abnormality EXAM: PORTABLE CHEST - 1 VIEW COMPARISON:  06/04/2023 FINDINGS: Lungs are clear. Persistent blunting of the right lateral costophrenic angle. Heart size and mediastinal contours are within normal limits. No effusion. Visualized bones unremarkable. IMPRESSION: No acute cardiopulmonary disease. Electronically Signed   By: JONETTA Faes M.D.   On: 08/28/2023 20:01     Labs   CBC: Recent Labs  Lab 08/27/23 0548 08/28/23 1916  WBC 9.5 8.2  NEUTROABS  --  5.4  HGB 11.6* 12.9*  HCT 34.6* 38.8*  MCV 87.8 89.4  PLT 260 254    Basic Metabolic Panel: Recent Labs  Lab 08/27/23 0548 08/28/23 1916  NA 135 139  K 3.1* 4.1  CL 100 99  CO2 27 30  GLUCOSE 112* 94  BUN 13 13  CREATININE 0.83 0.74  CALCIUM  8.6* 9.0   GFR: Estimated Creatinine Clearance: 84.3 mL/min (by C-G formula based on SCr of 0.74 mg/dL). Recent Labs  Lab 08/27/23 0548 08/28/23 1916  WBC 9.5 8.2  LATICACIDVEN  --  1.5    Liver Function Tests: Recent Labs  Lab 08/28/23 1916  AST 20  ALT 11  ALKPHOS 72  BILITOT 0.7  PROT 7.3  ALBUMIN  3.8   No results for input(s): LIPASE, AMYLASE in the last 168 hours. No results for input(s): AMMONIA in the last 168 hours.  ABG    Component Value Date/Time   PHART 7.513 (H) 02/19/2014 0500   PCO2ART 33.6 (L) 02/19/2014 0500   PO2ART 148.0 (H) 02/19/2014 0500   HCO3 26.8 (H) 02/19/2014 0500   TCO2 27.8 02/19/2014 0500   ACIDBASEDEF 5.7 (H) 02/18/2014 1210    O2SAT 99.1 02/19/2014 0500     Coagulation Profile: Recent Labs  Lab 08/28/23 1916  INR 1.0    Cardiac Enzymes: No results for input(s): CKTOTAL, CKMB, CKMBINDEX, TROPONINI in the last 168 hours.  HbA1C: Hgb A1c MFr Bld  Date/Time Value Ref Range Status  05/27/2023 05:16 PM 5.2 4.8 - 5.6 % Final    Comment:    (NOTE) Pre diabetes:          5.7%-6.4%  Diabetes:              >6.4%  Glycemic control for   <7.0% adults with diabetes   01/23/2021 07:18 PM 5.9 (H) 4.8 - 5.6 % Final    Comment:             Prediabetes: 5.7 - 6.4          Diabetes: >6.4          Glycemic control for adults with diabetes: <7.0     CBG: Recent Labs  Lab 08/26/23 1111  GLUCAP 116*    Review of Systems:   ***  Past Medical History  He,  has a past medical history of Acute  hypoxemic respiratory failure (HCC) (07/27/2021), Adrenal insufficiency (HCC), Anxiety, Aortic atherosclerosis (HCC), BPH (benign prostatic hyperplasia), Cancer (HCC), Cerebral microvascular disease, Cerebrovascular accident (CVA) of left basal ganglia (HCC) (05/27/2023), Cervical disc disease with myelopathy (07/29/2021), Chronic back pain, 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, with delusions and tactile/visual hallucinations (04/20/2022), COPD (chronic obstructive pulmonary disease) (HCC) (05/27/2023), Depression, Diastolic dysfunction, Emphysema of lung (HCC), Erectile dysfunction (03/26/2017), GERD (gastroesophageal reflux disease), Hemorrhoids, HLD (hyperlipidemia), Hypertension, Intermittent explosive disorder, Left sided lacunar infarction (HCC) (05/18/2023), Polysubstance abuse (HCC), Prediabetes (02/20/2021), PTSD (post-traumatic stress disorder), Pulmonary nodule (12/10/2016), Right-sided carotid artery disease (HCC), Risk for falls, Stenosis of right carotid artery (05/28/2023), and Suicide attempt (HCC).   Surgical History    Past Surgical  History:  Procedure Laterality Date  . CHEST TUBE INSERTION    . COLONOSCOPY WITH PROPOFOL  N/A 05/14/2021   Procedure: COLONOSCOPY WITH PROPOFOL ;  Surgeon: Therisa Bi, MD;  Location: [facility] ENDOSCOPY;  Service: Gastroenterology;  Laterality: N/A;  . ENDARTERECTOMY Right 08/26/2023   Procedure: ENDARTERECTOMY, CAROTID;  Surgeon: Marea Selinda RAMAN, MD;  Location: [facility] ORS;  Service: Vascular;  Laterality: Right;  . EVALUATION UNDER ANESTHESIA WITH HEMORRHOIDECTOMY N/A 09/10/2021   Procedure: EXAM UNDER ANESTHESIA WITH HEMORRHOIDECTOMY of 2+ columns;  Surgeon: Desiderio Schanz, MD;  Location: [facility] ORS;  Service: General;  Laterality: N/A;  Provider requesting 1.5 hours / 90 minutes for procedure.  SABRA LYMPH NODE BIOPSY       Social History   reports that he has been smoking cigarettes. He has a 45 pack-year smoking history. He quit smokeless tobacco use about 8 years ago. He reports current alcohol use. He reports current drug use. Drugs: Marijuana and Cocaine.   Family History   His family history includes Cirrhosis in his father; Diabetes in his mother; Heart attack in his maternal grandmother; Heart disease in his father; Hypertension in his father and mother; Liver cancer in his father; Lung cancer in his sister; Other in his mother and paternal grandmother; Stomach cancer in his cousin; Suicidality in his maternal grandfather and paternal grandfather.   Allergies Allergies  Allergen Reactions  . Carbamazepine Rash and Hives  . Ibuprofen Nausea And Vomiting, Rash and Other (See Comments)    GI Bleeding   . Risperidone  Swelling    Tongue swelling  . Naproxen  Other (See Comments)    GI bleeding     Home Medications  Prior to Admission medications   Medication Sig Start Date End Date Taking? Authorizing Provider  albuterol  (VENTOLIN  HFA) 108 (90 Base) MCG/ACT inhaler Inhale 2 inhalations into the lungs every 6 (six) hours as needed 11/11/21     amLODipine  (NORVASC ) 10 MG tablet Take 1 tablet (10 mg  total) by mouth daily. 05/29/23   Laurita Pillion, MD  aspirin  EC 81 MG tablet Take 1 tablet (81 mg total) by mouth daily. Swallow whole. 05/29/23 05/28/24  Laurita Pillion, MD  atorvastatin  (LIPITOR) 80 MG tablet Take 1 tablet (80 mg total) by mouth daily. Patient not taking: Reported on 08/23/2023 05/30/23   Laurita Pillion, MD  hydrochlorothiazide  (HYDRODIURIL ) 25 MG tablet Take 1 tablet by mouth daily. 06/01/23 05/31/24  [provider]  DULoxetine  (CYMBALTA ) 30 MG capsule Take 1 capsule (30 mg total) by mouth daily for 7 days, THEN 2 capsules (60 mg total) daily 10/23/21 01/12/22       Active Hospital Problem list   See systems below  Assessment & Plan:      Best practice:  Diet:  {Ipzu:73067} Pain/Anxiety/Delirium protocol (if indicated): {Pain/Anxiety/Delirium:26941} VAP protocol (if indicated): {VAP:29640} DVT prophylaxis: {DVT Prophylaxis:26933} GI prophylaxis: {GI:26934} Glucose control:  {Glucose Control:26935} Central venous access:  {Central Venous Access:26936} Arterial line:  {Central Venous Access:26936} Foley:  {Central Venous Access:26936} Mobility:  {Mobility:26937}  PT consulted: {PT Consult:26938} Last date of multidisciplinary goals of care discussion []  Code Status:  {Code Status:26939} Disposition: ***   = Goals of Care = Code Status Order: FULL   Wishes to pursue full aggressive treatment and intervention options, including CPR and intubation, but goals of care will be addressed on going with family if that should become necessary.  Critical care time: 45 minutes        Almarie Nose DNP, CCRN, FNP-C, AGACNP-BC Acute Care & Family Nurse Practitioner [facility] PCCM on call pager [phone]

## 2023-08-29 NOTE — Transfer of Care (Signed)
 Immediate Anesthesia Transfer of Care Note  Patient: Alvin Gonzalez.  Procedure(s) Performed: exacuation of hematoma and repair of arterial branch (Right)  Patient Location: PACU  Anesthesia Type:General  Level of Consciousness: awake, alert , and oriented  Airway & Oxygen Therapy: Patient Spontanous Breathing and Patient connected to nasal cannula oxygen  Post-op Assessment: Report given to RN and Post -op Vital signs reviewed and stable  Post vital signs: Reviewed and stable  Last Vitals:  Vitals Value Taken Time  BP 147/84 08/29/23 00:38  Temp 36.1 C 08/29/23 00:16  Pulse 89 08/29/23 00:42  Resp 8 08/29/23 00:42  SpO2 92 % 08/29/23 00:42  Vitals shown include unfiled device data.  Last Pain:  Vitals:   08/29/23 0016  TempSrc:   PainSc: Asleep         Complications: No notable events documented.

## 2023-08-30 ENCOUNTER — Encounter: Payer: Self-pay | Admitting: Vascular Surgery

## 2023-08-30 ENCOUNTER — Other Ambulatory Visit: Payer: Self-pay

## 2023-08-30 DIAGNOSIS — T819XXA Unspecified complication of procedure, initial encounter: Secondary | ICD-10-CM

## 2023-08-30 DIAGNOSIS — I97638 Postprocedural hematoma of a circulatory system organ or structure following other circulatory system procedure: Secondary | ICD-10-CM

## 2023-08-30 LAB — BASIC METABOLIC PANEL WITH GFR
Anion gap: 11 (ref 5–15)
BUN: 16 mg/dL (ref 8–23)
CO2: 29 mmol/L (ref 22–32)
Calcium: 9.1 mg/dL (ref 8.9–10.3)
Chloride: 97 mmol/L — ABNORMAL LOW (ref 98–111)
Creatinine, Ser: 0.74 mg/dL (ref 0.61–1.24)
GFR, Estimated: >60 mL/min (ref >60)
Glucose, Bld: 104 mg/dL — ABNORMAL HIGH (ref 70–99)
Potassium: 3.5 mmol/L (ref 3.5–5.1)
Sodium: 137 mmol/L (ref 135–145)

## 2023-08-30 LAB — CBC
HCT: 33.9 % — ABNORMAL LOW (ref 39.0–52.0)
Hemoglobin: 11.4 g/dL — ABNORMAL LOW (ref 13.0–17.0)
MCH: 29.9 pg (ref 26.0–34.0)
MCHC: 33.6 g/dL (ref 30.0–36.0)
MCV: 89 fL (ref 80.0–100.0)
Platelets: 271 K/uL (ref 150–400)
RBC: 3.81 MIL/uL — ABNORMAL LOW (ref 4.22–5.81)
RDW: 13.4 % (ref 11.5–15.5)
WBC: 8.7 K/uL (ref 4.0–10.5)
nRBC: 0 % (ref 0.0–0.2)

## 2023-08-30 LAB — MAGNESIUM: Magnesium: 2.2 mg/dL (ref 1.7–2.4)

## 2023-08-30 MED ORDER — POLYETHYLENE GLYCOL 3350 17 G PO PACK
17.0000 g | PACK | Freq: Every day | ORAL | Status: DC
  Administered 2023-08-30: 17 g via ORAL
  Filled 2023-08-30: qty 1

## 2023-08-30 MED ORDER — OXYCODONE HCL 5 MG PO TABS
5.0000 mg | ORAL_TABLET | ORAL | 0 refills | Status: AC | PRN
  Filled 2023-08-30: qty 20, 2d supply, fill #0
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
## 2023-08-30 NOTE — Plan of Care (Signed)
  Problem: Clinical Measurements: Goal: Ability to maintain clinical measurements within normal limits will improve Outcome: Progressing Goal: Will remain free from infection Outcome: Progressing Goal: Respiratory complications will improve Outcome: Progressing Goal: Cardiovascular complication will be avoided Outcome: Progressing   Problem: Activity: Goal: Risk for activity intolerance will decrease Outcome: Progressing   Problem: Nutrition: Goal: Adequate nutrition will be maintained Outcome: Progressing   Problem: Elimination: Goal: Will not experience complications related to bowel motility Outcome: Progressing Goal: Will not experience complications related to urinary retention Outcome: Progressing   Problem: Pain Managment: Goal: General experience of comfort will improve and/or be controlled Outcome: Progressing

## 2023-08-30 NOTE — Discharge Instructions (Signed)
 Do not lift anything heavy do not lift anything more than a gallon of milk for the next 2 weeks.  You may shower when you get home.  Do not stand with the water directly hitting your neck.  It is okay to let soap and water run over your incision to your right neck but do not scrub it.  Pat it completely dry after showering.  Do not drive while taking any pain medication.  We recommend you do not drive for the next 2 weeks.  You are being discharged home on aspirin  81 mg daily and Lipitor 20 mg daily.  We asked that you do not miss or skip any of these medications as will affect the outcome of your surgery.  Follow-up with vein and vascular as scheduled.

## 2023-08-30 NOTE — Progress Notes (Signed)
 Meds to bed brought from pharmacy. Patient's mother coming to pick him up.

## 2023-08-30 NOTE — Progress Notes (Signed)
 Discharge instructions given to and reviewed with patient. Patient verbalized understanding of all discharge instructions including follow up care and new prescription.

## 2023-08-30 NOTE — Discharge Summary (Signed)
 [facility]    Discharge Summary    Patient ID:  Alvin Gonzalez. MRN: [id number] DOB/AGE: 06/06/1960 63 y.o.  Admit date: 08/28/2023 Discharge date: 08/30/2023 Date of Surgery: 08/28/2023 - 08/29/2023 Surgeon: Clotilde): Esco, Curry LABOR, MD  Admission Diagnosis: Post-operative pain [G89.18] Hematoma of neck [S10.93XA] Postoperative hematoma involving circulatory system following other circulatory system procedure [I97.638]  Discharge Diagnoses:  Post-operative pain [G89.18] Hematoma of neck [S10.93XA] Postoperative hematoma involving circulatory system following other circulatory system procedure [I97.638]  Secondary Diagnoses: Past Medical History:  Diagnosis Date   Acute hypoxemic respiratory failure (HCC) 07/27/2021   Adrenal insufficiency (HCC)    Anxiety    Aortic atherosclerosis (HCC)    BPH (benign prostatic hyperplasia)    Cancer (HCC)    Cerebral microvascular disease    Cerebrovascular accident (CVA) of left basal ganglia (HCC) 05/27/2023   a.) Brain MRI 05/27/2023: acute perforator infarct in the LEFT basal ganglia   Cervical disc disease with myelopathy 07/29/2021   Chronic back pain    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, with delusions and tactile/visual hallucinations 04/20/2022   COPD (chronic obstructive pulmonary disease) (HCC) 05/27/2023   Depression    Diastolic dysfunction    Emphysema of lung (HCC)    Erectile dysfunction 03/26/2017   GERD (gastroesophageal reflux disease)    Hemorrhoids    HLD (hyperlipidemia)    Hypertension    Intermittent explosive disorder    Left sided lacunar infarction (HCC) 05/18/2023   a.) CT head 05/18/2023: acute LEFT thalamocapsular lacunar infarction   Polysubstance abuse (HCC)    a.) cocaine + THC + opioids + tobacco   Prediabetes 02/20/2021   PTSD (post-traumatic stress disorder)    Pulmonary nodule 12/10/2016    Right-sided carotid artery disease (HCC)    a.) CTA head/neck 05/27/2023: 80% origin RICA   Risk for falls    Stenosis of right carotid artery 05/28/2023   Suicide attempt (HCC)    prior to 02/18/14 admission     Procedure(s): exacuation of hematoma and repair of arterial branch  Discharged Condition: good  HPI:  Alvin Gonzalez is a 63 year old male who presented today to vascular clinic with right high-grade ICA stenosis needing a carotid endarterectomy.  He has had a previous left hemispheric stroke X2.  His past history includes chronic cerebral micro vascular disease, hyperlipidemia, prediabetes, COPD, hypoxic respiratory failure, pulmonary nodules, GERD, BPH, chronic back pain, anxiety, depression, PTSD, suicide attempt, intermittent explosive disorder, polysubstance abuse with cocaine plus THC plus opioids plus tobacco.  He is noted to have cocaine induced psychotic disorder with delusions.  Patient is a chronic crack user which contributes to his chronic hypertension.  Hospital Course:  Alvin Gonzalez. is a 63 y.o. male is S/P Right Bleeding/hematoma- right neck- s/p right carotid endarterectomy.  Patient recovering as expected.  Feels better this morning.  Appropriate right neck pain.  Denies any difficulty swallowing or denies any difficulty breathing.  Remains hypertensive but on oral medications.  Patient was given Norvasc  10 mg p.o. this morning.  Patient continues to be on nicotine  patch.  Patient states he is being discharged this morning or he will leave AMA just as he left AMA on Friday 08/27/2023 at noon time.  Patient was discharged on aspirin  81 mg daily and Lipitor 80 mg daily.  Patient was counseled not to skip or miss taking any of these medications as will affect the outcome of his  surgery.  Patient verbalizes understanding.  I spent greater than 60 minutes preparing implementing teaching this discharge.  Extubated: POD # 0 Physical Exam:  Alert notes x3, no acute  distress Face: Symmetrical.  Tongue is midline. Neck: Trachea is midline. Positive swelling and bruising. Cardiovascular: Regular rate and rhythm Pulmonary: Clear to auscultation bilaterally Abdomen: Soft, nontender, nondistended Left lower extremity: Thigh soft.  Calf soft.  Extremities warm distally toes.  Hard to palpate pedal pulses however the foot is warm is her good capillary refill. Right lower extremity: Thigh soft.  Calf soft.  Extremities warm distally toes.  Hard to palpate pedal pulses however the foot is warm is her good capillary refill. Neurological: No deficits noted   Post-op wounds:  clean, dry, intact or healing well  Pt. Ambulating, voiding and taking PO diet without difficulty. Pt pain controlled with PO pain meds.  Labs:  As below  Complications: none  Consults:    Significant Diagnostic Studies: CBC Lab Results  Component Value Date   WBC 8.7 08/30/2023   HGB 11.4 (L) 08/30/2023   HCT 33.9 (L) 08/30/2023   MCV 89.0 08/30/2023   PLT 271 08/30/2023    BMET    Component Value Date/Time   NA 137 08/30/2023 0546   NA 144 01/23/2021 1918   NA 144 02/18/2014 0914   K 3.5 08/30/2023 0546   K 3.0 (L) 02/18/2014 0914   CL 97 (L) 08/30/2023 0546   CL 106 02/18/2014 0914   CO2 29 08/30/2023 0546   CO2 28 02/18/2014 0914   GLUCOSE 104 (H) 08/30/2023 0546   GLUCOSE 137 (H) 02/18/2014 0914   BUN 16 08/30/2023 0546   BUN 13 01/23/2021 1918   BUN 15 02/18/2014 0914   CREATININE 0.74 08/30/2023 0546   CREATININE 2.18 (H) 02/18/2014 0914   CALCIUM  9.1 08/30/2023 0546   CALCIUM  8.9 02/18/2014 0914   GFRNONAA >60 08/30/2023 0546   GFRNONAA 34 (L) 02/18/2014 0914   GFRNONAA >60 07/16/2011 2028   GFRAA >60 11/01/2016 1421   GFRAA 41 (L) 02/18/2014 0914   GFRAA >60 07/16/2011 2028   COAG Lab Results  Component Value Date   INR 1.0 08/28/2023   INR 1.0 05/28/2023   INR 0.9 05/18/2023     Disposition:  Discharge to :Home  Allergies as of  08/30/2023       Reactions   Carbamazepine Rash, Hives   Ibuprofen Nausea And Vomiting, Rash, Other (See Comments)   GI Bleeding   Risperidone  Swelling   Tongue swelling   Naproxen  Other (See Comments)   GI bleeding        Medication List     TAKE these medications    amLODipine  10 MG tablet Commonly known as: NORVASC  Take 1 tablet (10 mg total) by mouth daily.   aspirin  EC 81 MG tablet Take 1 tablet (81 mg total) by mouth daily. Swallow whole.   atorvastatin  80 MG tablet Commonly known as: LIPITOR Take 1 tablet (80 mg total) by mouth daily.   hydrochlorothiazide  25 MG tablet Commonly known as: HYDRODIURIL  Take 1 tablet by mouth daily.   oxyCODONE  5 MG immediate release tablet Commonly known as: Oxy IR/ROXICODONE  Take 1-2 tablets (5-10 mg total) by mouth every 4 (four) hours as needed for moderate pain (pain score 4-6).   Ventolin  HFA 108 (90 Base) MCG/ACT inhaler Generic drug: albuterol  Inhale 2 inhalations into the lungs every 6 (six) hours as needed       Verbal and written Discharge  instructions given to the patient. Wound care per Discharge AVS  Follow-up Information     Delores Orvin BRAVO, NP Follow up in 3 month(s).   Specialty: Vascular Surgery Why: Bilateral carotid ultrasounds Contact information: [address] [place] [address] [phone]                 Signed: Gwendlyn JONELLE Shank, NP  08/30/2023, 9:36 AM

## 2023-08-30 NOTE — Plan of Care (Signed)
Vital signs reviewed, ICU needs resolved  Will sign off at this time. No further recommendations at this time.     Alvin Gonzalez Alvin Gonzalez, M.D.  [facility]  Medical Director ICU-[facility] [place] Medical Director [facility] Cardio-Pulmonary Department   

## 2023-09-02 ENCOUNTER — Other Ambulatory Visit: Payer: Self-pay

## 2023-09-02 LAB — CULTURE, BLOOD (ROUTINE X 2)
Culture: NO GROWTH
Culture: NO GROWTH

## 2023-09-02 MED ORDER — OXYCODONE-ACETAMINOPHEN 5-325 MG PO TABS
1.0000 | ORAL_TABLET | Freq: Four times a day (QID) | ORAL | 0 refills | Status: DC | PRN
Start: 2023-09-02 — End: 2023-09-06
  Filled 2023-09-02: qty 20, 5d supply, fill #0

## 2023-09-02 MED ORDER — ALBUTEROL SULFATE HFA 108 (90 BASE) MCG/ACT IN AERS
2.0000 | INHALATION_SPRAY | Freq: Four times a day (QID) | RESPIRATORY_TRACT | 2 refills | Status: AC | PRN
  Filled 2023-09-02: qty 18, 25d supply, fill #0

## 2023-09-02 MED ORDER — PREDNISONE 10 MG PO TABS
ORAL_TABLET | ORAL | 0 refills | Status: AC
  Filled 2023-09-02: qty 18, 9d supply, fill #0

## 2023-09-02 NOTE — Progress Notes (Signed)
 Chief Complaint  Patient presents with  . hosptial follow up    HPI  Alvin Gonzalez. is a 63 y.o. here for an acute issue.  He has a PMH of CVA, HLD, hyperglycemia, COPD, BPH, anxiety/depression, PTSD, suicide attempt drug use who recently underwent endarterectomy with postop complications of hematoma requiring surgical intervention.  Surgery on 08/26/2023.  Returned on 08/28/2023 with discharge on 08/29/1993.  Today chief complaint: Follow-up days of upper back pain.  He reports having it since his surgery.  Continues to have swelling to the right neck.  No lightheadedness.  Unable to find relief from his upper back pain.  He does feel short of breath.  Normal chest x-ray while in the hospital.  No fevers or chills.  Blood pressure stable in the office.    ROS  Pertinent items are noted in HPI.  Outpatient Encounter Medications as of 09/02/2023  Medication Sig Dispense Refill  . acetaminophen  (TYLENOL ) 500 mg capsule Take by mouth    . amLODIPine  (NORVASC ) 10 MG tablet Take 1 tablet (10 mg total) by mouth once daily 90 tablet 1  . aspirin  81 MG EC tablet Take 81 mg by mouth once daily    . hydroCHLOROthiazide  (HYDRODIURIL ) 25 MG tablet Take 1 tablet (25 mg total) by mouth once daily 90 tablet 3  . [DISCONTINUED] oxyCODONE  (ROXICODONE ) 5 MG immediate release tablet Take 5-10 mg by mouth    . albuterol  MDI, PROVENTIL , VENTOLIN , PROAIR , HFA 90 mcg/actuation inhaler Inhale 2 inhalations into the lungs every 6 (six) hours as needed for Wheezing 1 each 2  . amitriptyline  (ELAVIL ) 25 MG tablet Take 25 mg by mouth at bedtime (Patient not taking: Reported on 09/02/2023)    . atorvastatin  (LIPITOR) 80 MG tablet Take 80 mg by mouth once daily (Patient not taking: Reported on 09/02/2023)    . lisinopriL  (ZESTRIL ) 40 MG tablet Take 40 mg by mouth once daily (Patient not taking: Reported on 09/02/2023)    . metaxalone  (SKELAXIN ) 800 mg tablet Take 800 mg by mouth 3 (three) times daily as needed for Pain  (Patient not taking: Reported on 09/02/2023)    . oxyCODONE -acetaminophen  (PERCOCET) 5-325 mg tablet Take 1 tablet by mouth every 6 (six) hours as needed for up to 5 days 20 tablet 0  . pantoprazole  (PROTONIX ) 40 MG DR tablet Take 40 mg by mouth once daily (Patient not taking: Reported on 09/02/2023)    . predniSONE  (DELTASONE ) 10 MG tablet 3 tabs daily for 3 days, 2 tab daily for 3 days, 1 tab for 3 day 18 tablet 0  . tamsulosin  (FLOMAX ) 0.4 mg capsule Take 0.4 mg by mouth once daily Take 30 minutes after same meal each day. (Patient not taking: Reported on 09/02/2023)    . [DISCONTINUED] GENERIC EXTERNAL MEDICATION      No facility-administered encounter medications on file as of 09/02/2023.    Allergies as of 09/02/2023 - Reviewed 06/29/2023  Allergen Reaction Noted  . Risperidone  Swelling 06/19/2016  . Ibuprofen Nausea, Rash, Other (See Comments), and Vomiting 10/17/2014  . Naproxen  Other (See Comments) 08/24/2016  . Tegretol [carbamazepine] Rash 10/17/2014    Past Medical History:  Diagnosis Date  . Adrenal insufficiency (CMS/HHS-HCC)   . AKI (acute kidney injury) ()   . Anxiety   . BPH (benign prostatic hyperplasia)   . Cancer (CMS/HHS-HCC)   . Chronic prescription opiate use   . Cocaine use   . COPD (chronic obstructive pulmonary disease) (CMS/HHS-HCC)   . Depression   .  GERD (gastroesophageal reflux disease)   . Hemorrhoids   . Hypertension   . Hypokalemia   . Intermittent explosive disorder   . Marijuana use   . Polysubstance abuse (CMS/HHS-HCC)   . PTSD (post-traumatic stress disorder)   . PTSD (post-traumatic stress disorder)   . Pulmonary nodule   . Suicide attempt (CMS/HHS-HCC)   . Tobacco abuse     Past Surgical History:  Procedure Laterality Date  . COLONOSCOPY WITH PROPOFOL   05/14/2021   Surgeon: Therisa Bi, MD; Location: [facility] ENDOSCOPY; Service: Gastroenterology; Laterality: N/A  . EXAM UNDER ANESTHESIA WITH HEMORRHOIDECTOMY  09/10/2021   urgeon: Desiderio Schanz, MD; Location: [facility] ORS; Service: General; Laterality: N/A; Provider requesting 1.5 hours / 90 minutes for procedure.  . CHEST TUBE INSERTION     . HEMORRHOIDECTOMY EXTERNAL    . Lymphnode removed from neck      Vitals:   09/02/23 1343  BP: (!) 138/92  Pulse: 98    Physical Exam  General. Well appearing; NAD; VS reviewed     HEENT: Sclera and conjunctiva clear; EOMI,  Neck.  Decreased range of motion with right anterior neck edema, prominence.  No bruits noted. Lungs. Respirations unlabored; clear to auscultation bilaterally. Cardiovascular. Heart regular rate and rhythm without murmurs, gallops, or rubs. Msk: Multiple point tender areas on the mid upper spine and paraspinal muscles. Extremities:  No edema. Skin. Normal color and turgor Neurologic. Alert and oriented x3   Assessment and Plan 1. Hospital discharge follow-up 63 year old with above PMH who recently underwent endarterectomy followed by complications of hematoma requiring surgical evacuation.  Today for follow-up complains of severe upper back pain.  Lungs are clear.  No bruits on neck exam.  Significant tenderness on exam to the upper back.  Most likely musculoskeletal etiology.  Check labs, chest x-ray.  Follow-up depending on results.  2. Upper back pain  -     CBC w/auto Differential (5 Part) -     Comprehensive Metabolic Panel (CMP) -     X-ray chest PA and lateral -     oxyCODONE -acetaminophen  (PERCOCET) 5-325 mg tablet; Take 1 tablet by mouth every 6 (six) hours as needed for up to 5 days -     predniSONE  (DELTASONE ) 10 MG tablet; 3 tabs daily for 3 days, 2 tab daily for 3 days, 1 tab for 3 day  3. H/O carotid endarterectomy  -     CBC w/auto Differential (5 Part) -     Comprehensive Metabolic Panel (CMP) -     X-ray chest PA and lateral -     oxyCODONE -acetaminophen  (PERCOCET) 5-325 mg tablet; Take 1 tablet by mouth every 6 (six) hours as needed for up to 5 days -     predniSONE  (DELTASONE ) 10 MG  tablet; 3 tabs daily for 3 days, 2 tab daily for 3 days, 1 tab for 3 day  4. Lacunar stroke (CMS/HHS-HCC)   5. HTN (hypertension), benign   6.  COPD/SOB  Chest x-ray today.  -     albuterol  MDI, PROVENTIL , VENTOLIN , PROAIR , HFA 90 mcg/actuation inhaler; Inhale 2 inhalations into the lungs every 6 (six) hours as needed for Wheezing    I have personally performed this service.  [address] [place], [place]

## 2023-09-04 ENCOUNTER — Emergency Department

## 2023-09-04 ENCOUNTER — Other Ambulatory Visit: Payer: Self-pay

## 2023-09-04 ENCOUNTER — Emergency Department: Admission: EM | Admit: 2023-09-04 | Discharge: 2023-09-04 | Disposition: A | Discharge status: Home / Self Care

## 2023-09-04 ENCOUNTER — Encounter: Payer: Self-pay | Admitting: *Deleted

## 2023-09-04 DIAGNOSIS — L03312 Cellulitis of back [any part except buttock]: Secondary | ICD-10-CM | POA: Diagnosis not present

## 2023-09-04 DIAGNOSIS — J449 Chronic obstructive pulmonary disease, unspecified: Secondary | ICD-10-CM | POA: Insufficient documentation

## 2023-09-04 DIAGNOSIS — M546 Pain in thoracic spine: Secondary | ICD-10-CM | POA: Diagnosis present

## 2023-09-04 DIAGNOSIS — I110 Hypertensive heart disease with heart failure: Secondary | ICD-10-CM | POA: Insufficient documentation

## 2023-09-04 DIAGNOSIS — I5030 Unspecified diastolic (congestive) heart failure: Secondary | ICD-10-CM | POA: Diagnosis not present

## 2023-09-04 DIAGNOSIS — Z8673 Personal history of transient ischemic attack (TIA), and cerebral infarction without residual deficits: Secondary | ICD-10-CM | POA: Insufficient documentation

## 2023-09-04 LAB — CBC WITH DIFFERENTIAL/PLATELET
Abs Immature Granulocytes: 0.01 K/uL (ref 0.00–0.07)
Basophils Absolute: 0.1 K/uL (ref 0.0–0.1)
Basophils Relative: 1 %
Eosinophils Absolute: 0.2 K/uL (ref 0.0–0.5)
Eosinophils Relative: 3 %
HCT: 37.4 % — ABNORMAL LOW (ref 39.0–52.0)
Hemoglobin: 12.6 g/dL — ABNORMAL LOW (ref 13.0–17.0)
Immature Granulocytes: 0 %
Lymphocytes Relative: 36 %
Lymphs Abs: 2.6 K/uL (ref 0.7–4.0)
MCH: 29.9 pg (ref 26.0–34.0)
MCHC: 33.7 g/dL (ref 30.0–36.0)
MCV: 88.6 fL (ref 80.0–100.0)
Monocytes Absolute: 0.7 K/uL (ref 0.1–1.0)
Monocytes Relative: 10 %
Neutro Abs: 3.5 K/uL (ref 1.7–7.7)
Neutrophils Relative %: 50 %
Platelets: 404 K/uL — ABNORMAL HIGH (ref 150–400)
RBC: 4.22 MIL/uL (ref 4.22–5.81)
RDW: 13.7 % (ref 11.5–15.5)
WBC: 7.1 K/uL (ref 4.0–10.5)
nRBC: 0 % (ref 0.0–0.2)

## 2023-09-04 LAB — COMPREHENSIVE METABOLIC PANEL WITH GFR
ALT: 11 U/L (ref 0–44)
AST: 19 U/L (ref 15–41)
Albumin: 3.8 g/dL (ref 3.5–5.0)
Alkaline Phosphatase: 72 U/L (ref 38–126)
Anion gap: 12 (ref 5–15)
BUN: 14 mg/dL (ref 8–23)
CO2: 25 mmol/L (ref 22–32)
Calcium: 9 mg/dL (ref 8.9–10.3)
Chloride: 103 mmol/L (ref 98–111)
Creatinine, Ser: 0.49 mg/dL — ABNORMAL LOW (ref 0.61–1.24)
GFR, Estimated: >60 mL/min (ref >60)
Glucose, Bld: 123 mg/dL — ABNORMAL HIGH (ref 70–99)
Potassium: 3.1 mmol/L — ABNORMAL LOW (ref 3.5–5.1)
Sodium: 140 mmol/L (ref 135–145)
Total Bilirubin: 0.6 mg/dL (ref 0.0–1.2)
Total Protein: 6.7 g/dL (ref 6.5–8.1)

## 2023-09-04 LAB — PROTIME-INR
INR: 0.9 (ref 0.8–1.2)
Prothrombin Time: 12.7 s (ref 11.4–15.2)

## 2023-09-04 LAB — LIPASE, BLOOD: Lipase: 32 U/L (ref 11–51)

## 2023-09-04 MED ORDER — CYCLOBENZAPRINE HCL 10 MG PO TABS
10.0000 mg | ORAL_TABLET | Freq: Three times a day (TID) | ORAL | 0 refills | Status: AC | PRN
  Filled 2023-09-04: qty 30, 10d supply, fill #0

## 2023-09-04 MED ORDER — POTASSIUM CHLORIDE 20 MEQ PO PACK
40.0000 meq | PACK | Freq: Once | ORAL | Status: AC
  Administered 2023-09-04: 40 meq via ORAL
  Filled 2023-09-04: qty 2

## 2023-09-04 MED ORDER — MORPHINE SULFATE (PF) 4 MG/ML IV SOLN
4.0000 mg | Freq: Once | INTRAVENOUS | Status: AC
  Administered 2023-09-04: 4 mg via INTRAVENOUS
  Filled 2023-09-04: qty 1

## 2023-09-04 MED ORDER — CEPHALEXIN 500 MG PO CAPS
500.0000 mg | ORAL_CAPSULE | Freq: Two times a day (BID) | ORAL | 0 refills | Status: AC
  Filled 2023-09-04: qty 14, 7d supply, fill #0

## 2023-09-04 MED ORDER — LIDOCAINE 5 % EX PTCH
1.0000 | MEDICATED_PATCH | CUTANEOUS | 0 refills | Status: AC
  Filled 2023-09-04: qty 10, 10d supply, fill #0

## 2023-09-04 MED ORDER — ACETAMINOPHEN 500 MG PO TABS
1000.0000 mg | ORAL_TABLET | Freq: Once | ORAL | Status: AC
  Administered 2023-09-04: 1000 mg via ORAL
  Filled 2023-09-04: qty 2

## 2023-09-04 NOTE — ED Provider Notes (Signed)
 [facility] Provider Note    Event Date/Time   First MD Initiated Contact with Patient 09/04/23 1301     (approximate)   History   Back Pain  Per EMT report, patient came from home c/o upper back pain. Patient had carotid surgery on 7/17 and was seen on 7/19 for concerns about oozing from the wound.  Patient c/o upper back pain that is tender to the touch.    HPI Alvin Gonzalez. is a 63 y.o. male PMH COPD, prior CVA, diastolic CHF, hypertension, hyperlipidemia anxiety/depression, PTSD, polysubstance use, recent endarterectomy (08/26/2023) c/b postoperative bleeding/hematoma of right neck (admit 7/19-7/21/2025, hematoma evacuated) presents for evaluation of reported back pain - Patient states he has been having mid/upper back pain since his second neck surgery - Per chart review, was seen in clinic 2 days ago for this.  Thought to likely be musculoskeletal etiology.  Discharged with Percocet, prednisone .  CBC and CMP unremarkable at that time.  X-ray results below. - Took Percocet today with no relief.  Denies fevers.  No significant shortness of breath.  No chest pain or abdominal pain.  Denies any preceding trauma. - Patient states he is not having any difficulty with his neck, has not noticed any recent enlargement of the hematoma at his surgical site, no difficulty breathing or speaking   XR 09/02/23: FINDINGS:  Age-indeterminate anterior height loss of 2 lower thoracic vertebral  bodies.  Pleural spaces are normal (lateral view confirms no pleural effusion)  Peaked and asymmetrically elevated right hemidiaphragm obscuring the  costophrenic sulcus on frontal view.  Normal appearance of the heart and mediastinum.        Physical Exam   Triage Vital Signs: BP (!) 140/102   Pulse 75   Temp (!) 97.4 F (36.3 C) (Oral)   Resp 19   Ht 5' 5 (1.651 m)   Wt 55.8 kg   SpO2 100%   BMI 20.47 kg/m    Most recent vital signs: Vitals:    09/04/23 1330 09/04/23 1400  BP: 125/75 (!) 140/102  Pulse: 73 75  Resp:  19  Temp:    SpO2: 99% 100%     General: Awake, no distress.  Neck:  Hematoma at surgical site, no drainage, no overlying erythema.  No stridor. CV:  Good peripheral perfusion. RRR, RP 2+ Resp:  Normal effort. CTAB Abd:  No distention. Nontender to deep palpation throughout Back:   Diffuse tenderness to palpation throughout mid upper back, appears to be worse along spine though is diffuse throughout back.  Does have some mild overlying erythema in this area as well.  No fluctuance.   ED Results / Procedures / Treatments   Labs (all labs ordered are listed, but only abnormal results are displayed) Labs Reviewed  CBC WITH DIFFERENTIAL/PLATELET - Abnormal; Notable for the following components:      Result Value   Hemoglobin 12.6 (*)    HCT 37.4 (*)    Platelets 404 (*)    All other components within normal limits  COMPREHENSIVE METABOLIC PANEL WITH GFR - Abnormal; Notable for the following components:   Potassium 3.1 (*)    Glucose, Bld 123 (*)    Creatinine, Ser 0.49 (*)    All other components within normal limits  LIPASE, BLOOD  PROTIME-INR     EKG  N/a   RADIOLOGY Radiology interpreted by myself and radiology report reviewed.  No obvious acute pathology identified.  Possible mucous plugging in the lungs.  Small residual right neck hematoma.    PROCEDURES:  Critical Care performed: No  Procedures   MEDICATIONS ORDERED IN ED: Medications  morphine  (PF) 4 MG/ML injection 4 mg (4 mg Intravenous Given 09/04/23 1416)  acetaminophen  (TYLENOL ) tablet 1,000 mg (1,000 mg Oral Given 09/04/23 1415)  potassium chloride  (KLOR-CON ) packet 40 mEq (40 mEq Oral Given 09/04/23 1519)     IMPRESSION / MDM / ASSESSMENT AND PLAN / ED COURSE  I reviewed the triage vital signs and the nursing notes.                              DDX/MDM/AP: Differential diagnosis includes, but is not limited to,  possible previously occult thoracic fractures given abnormal x-ray 2 days ago in outpatient setting, also suspect possible mild overlying cellulitis of back given erythema, warmth, tenderness to touch.  Consider MSK strain from recent surgery.  Doubt pneumothorax.  Do not clinically suspect PE at this time given clear point tenderness.  Plan: - Labs - CT T-spine - Chest x-ray - Pain control - Reassess  Patient's presentation is most consistent with acute presentation with potential threat to life or bodily function.  The patient is on the cardiac monitor to evaluate for evidence of arrhythmia and/or significant heart rate changes.  ED course below.  Feeling much better after single round of pain medications.  CT T-spine with no fractures, nonspecific lung findings and possible mucous plugging or aspiration (patient denies any associated symptoms and no history of aspiration), and small residual surgical site hematoma.  Discussed with vascular surgery who states no further workup or intervention necessary for ongoing small hematoma, suspect secondary to soft tissue as opposed to large vascular bleed.  Patient feels well and request discharge home.  No evidence of acute pathology at this time, will proceed with discharge and plan for outpatient follow-up.  ED return precautions in place.  Patient agrees with plan.  Continue with Percocet which he is already prescribed.  Will also add Flexeril , Lidoderm  patches.  Suspect patient may have mild overlying cellulitis of upper back as well, Rx Keflex .  Clinical Course as of 09/04/23 1707  Sat Sep 04, 2023  1355 CBC was stable anemia, no leukocytosis  CMP with mild hypokalemia, otherwise unremarkable [MM]  1525 Chest x-ray reviewed, no acute pathology my interpretation, formal radiology report below  IMPRESSION: No active disease.   [MM]  1539 CT Tspine: IMPRESSION: 1. No acute thoracic spine fracture is identified. 2. Spurring causes foraminal  impingement at the C5-6, C6-7, T7-8, T8-9, and T1-2 levels. 3. Hematoma along the carotid space of the right neck, better shown on the CT neck from 08/28/2023. This is only partially and incompletely included on today's thoracic spine CT. 4. Unusual 3 cm long filling defect in the posterior basal segment airway of the left lower lobe. While this could be mucous plugging, it does not completely fill the upper airway and seems to have a somewhat cylindrical shape. Is the patient at risk for aspiration of food stuff material? There is also a focal filling defect measuring 0.5 cm in diameter anteriorly in the left lower lobe tracheobronchial tree. Probable mucous plugging dependently in the right mainstem bronchus proximally. 5. Aortic Atherosclerosis (ICD10-I70.0) and Emphysema (ICD10-J43.9).   [MM]  1650 Patient reevaluated, notes back pain is significantly improved.  Discussed CT findings of possible small hematoma, patient strongly prefers discharge home if possible.  Will discuss with vascular surgery.  With regard to nonspecific Noncon filling defects with possible mucous plugging or aspiration, patient denies any recent changes chronic cough and no shortness of breath.  No aspiration events or loss of consciousness. [MM]  1659 D/w Dr. Laurence of vascular surgery, no clinical concern about small residual hematoma, this would be due to soft tissue bleeding as opposed to large vascular bleeding at this timeframe.  No indication for further workup or management at this time. [MM]    Clinical Course User Index [MM] Clarine Ozell LABOR, MD     FINAL CLINICAL IMPRESSION(S) / ED DIAGNOSES   Final diagnoses:  Thoracic back pain, unspecified back pain laterality, unspecified chronicity  Cellulitis of back     Rx / DC Orders   ED Discharge Orders          Ordered    cyclobenzaprine  (FLEXERIL ) 10 MG tablet  3 times daily PRN        09/04/23 1705    cephALEXin  (KEFLEX ) 500 MG capsule  2 times  daily        09/04/23 1705    lidocaine  (LIDODERM ) 5 %  Every 24 hours        09/04/23 1705             Note:  This document was prepared using Dragon voice recognition software and may include unintentional dictation errors.   Clarine Ozell LABOR, MD 09/04/23 [phone]

## 2023-09-04 NOTE — ED Triage Notes (Signed)
 Per EMT report, patient came from home c/o upper back pain. Patient had carotid surgery on 7/17 and was seen on 7/19 for concerns about oozing from the wound.  Patient c/o upper back pain that is tender to the touch.

## 2023-09-04 NOTE — ED Notes (Signed)
 RN attempting to provide d/c instructions. Pt got out of bed and started taking monitoring equipment off. Pt refusing for RN to provide d/c education. Pt encouraged to stay while RN provided instructions about follow up and medication. Pt states it don't matter I am going somewhere else anyway. Pt walked out with AVS with steady gait.

## 2023-09-04 NOTE — Discharge Instructions (Addendum)
 Your evaluation in the emergency department was overall reassuring.  A CT scan of your back showed no fractures, we suspect you have a muscle strain of your back.  Continue with the Percocet as already prescribed, and have also prescribed you Flexeril  and Lidoderm  patches to use in addition.  I do suspect you may have some mild skin infection on your back as well, and I prescribed you a course of antibiotics to treat this.  Please follow-up with your primary care provider for reevaluation, and return to the emergency department with any new or worsening symptoms.  As discussed, CT scan of your chest did show possible mucus plugging.  I recommend you discuss this further with your primary care provider.

## 2023-09-05 ENCOUNTER — Other Ambulatory Visit: Payer: Self-pay

## 2023-09-06 ENCOUNTER — Other Ambulatory Visit: Payer: Self-pay

## 2023-09-06 MED ORDER — OXYCODONE-ACETAMINOPHEN 5-325 MG PO TABS
1.0000 | ORAL_TABLET | Freq: Four times a day (QID) | ORAL | 0 refills | Status: AC | PRN
  Filled 2023-09-06: qty 20, 5d supply, fill #0

## 2023-09-07 ENCOUNTER — Other Ambulatory Visit: Payer: Self-pay | Admitting: Family Medicine

## 2023-09-07 DIAGNOSIS — M5442 Lumbago with sciatica, left side: Secondary | ICD-10-CM

## 2023-09-09 ENCOUNTER — Ambulatory Visit
Admission: RE | Admit: 2023-09-09 | Discharge: 2023-09-09 | Disposition: A | Discharge status: Home / Self Care | Source: Ambulatory Visit | Attending: Family Medicine | Admitting: Family Medicine

## 2023-09-09 DIAGNOSIS — M5442 Lumbago with sciatica, left side: Secondary | ICD-10-CM | POA: Insufficient documentation

## 2023-09-09 NOTE — Progress Notes (Addendum)
 Alvin Earl Grieshop Jr. is a 63 y.o. male here for follow-up evaluation.  Chief Complaint Chief Complaint  Patient presents with  . Follow-up    HPI Since I saw him in April he has had several ER visits and admissions.  He underwent endarterectomy and had postop complications of hematoma requiring surgical intervention.  He was also having issues with back pain.  He was seen most recently in the ED July 29 with complaints of abdominal pain CT scan showed mild colitis.  CT of the neck showed a gas collection at the previous surgical site with an adjacent fluid collection.  He had normal comp panel lipase.  CBC showed a mild elevation white count 11.6 platelets 457.  He was started on keflex .  Prior he had admission to [facility] February 17 when he presented with complaints of numbness tingling in his right arm and foot as well as nausea vomiting diarrhea and abdominal pain ongoing for 10 days.  He had a CT of the head that showed a left thalamic lacunar infarct.  MRI confirmed this.  CT angio showed right sided ICA stenosis of 80%.  He was seen by neurology and vascular surgery.  He was started on Plavix  and aspirin  as well as Lipitor.  Echo was done and showed an EF of 55 to 60%.  No PFO.  It was recommended he continue Plavix  for 21 days and aspirin  long-term.  He was also started on high-dose Lipitor.   Tox  screen was positive for cocaine as well as opiates marijuana and tricyclic's. For his BP he is on norvasc  and hctz.  His CT chest showed some mucus plugging. He does have chronic cough. Smokes 1 ppd.  Today co some abd pain when he gets too hot. On and off for years.  SAys has not done cocaine in a couple of days - trying to straighten himself out  Francene is currently unemployed and was on disability (but lost it) after an injury in 2016 when falling from a tree.  Lives with his mother. No children. He does not drink alcohol.  He is a smoker 1/2 ppd for > 40 years. He is on parole  and says has to go to court as his his sister issued a warrant on him and he is stressed about that..  Problem List Patient Active Problem List  Diagnosis  . HTN (hypertension), benign  . Left-sided low back pain with left-sided sciatica  . Left hip pain  . Polysubstance abuse (CMS/HHS-HCC)  . Tobacco abuse  . Carotid stenosis, right    Past Medical History:  Diagnosis Date  . Adrenal insufficiency (CMS/HHS-HCC)   . AKI (acute kidney injury) ()   . Anxiety   . BPH (benign prostatic hyperplasia)   . Cancer (CMS/HHS-HCC)   . Chronic prescription opiate use   . Cocaine use   . COPD (chronic obstructive pulmonary disease) (CMS/HHS-HCC)   . Depression   . GERD (gastroesophageal reflux disease)   . Hemorrhoids   . Hypertension   . Hypokalemia   . Intermittent explosive disorder   . Marijuana use   . Polysubstance abuse (CMS/HHS-HCC)   . PTSD (post-traumatic stress disorder)   . PTSD (post-traumatic stress disorder)   . Pulmonary nodule   . Suicide attempt (CMS/HHS-HCC)   . Tobacco abuse     Past Surgical History:  Procedure Laterality Date  . COLONOSCOPY WITH PROPOFOL   05/14/2021   Surgeon: Therisa Bi, MD; Location: [facility] ENDOSCOPY; Service: Gastroenterology; Laterality: N/A  .  EXAM UNDER ANESTHESIA WITH HEMORRHOIDECTOMY  09/10/2021   urgeon: Desiderio Schanz, MD; Location: [facility] ORS; Service: General; Laterality: N/A; Provider requesting 1.5 hours / 90 minutes for procedure.  . CHEST TUBE INSERTION     . HEMORRHOIDECTOMY EXTERNAL    . Lymphnode removed from neck      Social History Social History   Socioeconomic History  . Marital status: Legally Separated  Tobacco Use  . Smoking status: Every Day    Current packs/day: 0.50    Types: Cigarettes  . Smokeless tobacco: Never  Vaping Use  . Vaping status: Never Used  Substance and Sexual Activity  . Alcohol use: No  . Drug use: No  . Sexual activity: Defer   Social Drivers of Health   Financial Resource Strain:  Low Risk  (09/09/2023)   Overall Financial Resource Strain (CARDIA)   . Difficulty of Paying Living Expenses: Not hard at all  Food Insecurity: No Food Insecurity (09/09/2023)   Hunger Vital Sign   . Worried About Programme researcher, broadcasting/film/video in the Last Year: Never true   . Ran Out of Food in the Last Year: Never true  Transportation Needs: No Transportation Needs (09/09/2023)   PRAPARE - Transportation   . Lack of Transportation (Medical): No   . Lack of Transportation (Non-Medical): No  Housing Stability: Low Risk  (09/09/2023)   Housing Stability Vital Sign   . Unable to Pay for Housing in the Last Year: No   . Number of Times Moved in the Last Year: 0   . Homeless in the Last Year: No    Family History Family History  Problem Relation Name Age of Onset  . High blood pressure (Hypertension) Mother    . Diabetes Mother    . High blood pressure (Hypertension) Father    . Myocardial Infarction (Heart attack) Father    . Heart disease Father    . Cirrhosis Father    . Liver cancer Father    . Lung cancer Sister    . Myocardial Infarction (Heart attack) Maternal Grandmother    . Suicidality Maternal Grandfather    . Suicidality Paternal Grandfather      Medication   Medication List       * Accurate as of September 09, 2023  8:58 AM. If you have any questions, ask your nurse or doctor.          CONTINUE taking these medications    acetaminophen  500 mg capsule Commonly known as: TYLENOL    albuterol  MDI (PROVENTIL , VENTOLIN , PROAIR ) HFA 90 mcg/actuation inhaler Inhale 2 inhalations into the lungs every 6 (six) hours as needed for Wheezing   amitriptyline  25 MG tablet Commonly known as: ELAVIL    amLODIPine  10 MG tablet Commonly known as: NORVASC  Take 1 tablet (10 mg total) by mouth once daily   aspirin  81 MG EC tablet   atorvastatin  80 MG tablet Commonly known as: LIPITOR   cyclobenzaprine  10 MG tablet Commonly known as: FLEXERIL    hydroCHLOROthiazide  25 MG  tablet Commonly known as: HYDRODIURIL  Take 1 tablet (25 mg total) by mouth once daily   lisinopriL  40 MG tablet Commonly known as: ZESTRIL    metaxalone  800 mg tablet Commonly known as: SKELAXIN    oxyCODONE -acetaminophen  5-325 mg tablet Commonly known as: PERCOCET Take 1 tablet by mouth every 6 (six) hours as needed for up to 5 days   pantoprazole  40 MG DR tablet Commonly known as: PROTONIX    predniSONE  10 MG tablet Commonly known as: DELTASONE  3 tabs  daily for 3 days, 2 tab daily for 3 days, 1 tab for 3 day   tamsulosin  0.4 mg capsule Commonly known as: FLOMAX         Allergy Allergies  Allergen Reactions  . Risperidone  Swelling    Tongue swelling  Tongue swelling  Tongue swelling? Pt self report  Tongue swelling  Tongue swelling  Tongue swelling  Tongue swelling? Pt self report  . Ibuprofen Nausea, Rash, Other (See Comments) and Vomiting    GI bleeding  . Naproxen  Other (See Comments)    stomach bleeding  stomach bleeding  stomach bleeding  stomach bleeding  stomach bleeding  . Tegretol [Carbamazepine] Rash    Review of System A comprehensive 11 system ROS was negative except as per HPI  Physical exam BP (!) 142/86   Pulse 76   Ht 165.1 cm (5' 5)   Wt 57.2 kg (126 lb)   SpO2 95%   BMI 20.97 kg/m   GENERAL:  The patient is alert, oriented and in no acute distress.  HEENT:  PERRLA.  Oropharynx moist without lesions.  Head is normocephalic/atraumatic.  Pupils equal, round and reactive to light and accommodation.  Extraocular movements are intact. Nasal mucosa is normal.  External ear canals normal.  Tympanic membranes are clear.   NECK: Right side of his neck also well coapted incision from his recent carotid endarterectomy with mild swelling.  No erythema, purulence or tenderness. LUNGS:  Clear to auscultation and percussion.   CARDIAC:  Regular rate and rhythm, normal S1 and S2 without murmurs, rubs or gallops.   VASCULAR: Carotid and radial pulses  2+.  Distal pulses are 2+.   ABDOMEN:  Soft, with normal bowel sounds.  No organomegaly or tenderness was found.   EXTREMITIES:  L hip with pain with ROM  GENITAL/RECTAL deferred as no complaints NEUROLOGIC:  The patient is alert and oriented.  Cranial nerves II-XII intact.  Motor and sensory examination within normal limits.   Gait normal.  Reflexes symmetric and brisk.   DERMATOLOGIC:  No skin lesions, scaling or bruising noted.    LYMPH:  No cervical or supraclavicular nodes.     Labs Office Visit on 09/02/2023  Component Date Value Ref Range Status  . WBC (White Blood Cell Count) 09/02/2023 6.3  4.1 - 10.2 10^3/uL Final  . RBC (Red Blood Cell Count) 09/02/2023 4.23 (L)  4.69 - 6.13 10^6/uL Final  . Hemoglobin 09/02/2023 12.7 (L)  14.1 - 18.1 gm/dL Final  . Hematocrit 92/75/7974 37.7 (L)  40.0 - 52.0 % Final  . MCV (Mean Corpuscular Volume) 09/02/2023 89.1  80.0 - 100.0 fl Final  . MCH (Mean Corpuscular Hemoglobin) 09/02/2023 30.0  27.0 - 31.2 pg Final  . MCHC (Mean Corpuscular Hemoglobin * 09/02/2023 33.7  32.0 - 36.0 gm/dL Final  . Platelet Count 09/02/2023 376  150 - 450 10^3/uL Final  . RDW-CV (Red Cell Distribution Widt* 09/02/2023 13.1  11.6 - 14.8 % Final  . MPV (Mean Platelet Volume) 09/02/2023 9.5  9.4 - 12.4 fl Final  . Neutrophils 09/02/2023 3.68  1.50 - 7.80 10^3/uL Final  . Lymphocytes 09/02/2023 1.48  1.00 - 3.60 10^3/uL Final  . Monocytes 09/02/2023 0.72  0.00 - 1.50 10^3/uL Final  . Eosinophils 09/02/2023 0.38  0.00 - 0.55 10^3/uL Final  . Basophils 09/02/2023 0.05  0.00 - 0.09 10^3/uL Final  . Neutrophil % 09/02/2023 58.2  32.0 - 70.0 % Final  . Lymphocyte % 09/02/2023 23.4  10.0 - 50.0 % Final  .  Monocyte % 09/02/2023 11.4  4.0 - 13.0 % Final  . Eosinophil % 09/02/2023 6.0 (H)  1.0 - 5.0 % Final  . Basophil% 09/02/2023 0.8  0.0 - 2.0 % Final  . Immature Granulocyte % 09/02/2023 0.2  <=0.7 % Final  . Immature Granulocyte Count 09/02/2023 0.01  <=0.06 10^3/L Final   . Glucose 09/02/2023 113 (H)  70 - 110 mg/dL Final  . Sodium 92/75/7974 141  136 - 145 mmol/L Final  . Potassium 09/02/2023 3.4 (L)  3.6 - 5.1 mmol/L Final  . Chloride 09/02/2023 103  97 - 109 mmol/L Final  . Carbon Dioxide (CO2) 09/02/2023 32.0  22.0 - 32.0 mmol/L Final  . Urea  Nitrogen (BUN) 09/02/2023 16  7 - 25 mg/dL Final  . Creatinine 92/75/7974 1.1  0.7 - 1.3 mg/dL Final  . Glomerular Filtration Rate (eGFR) 09/02/2023 76  >60 mL/min/1.73sq m Final  . Calcium  09/02/2023 9.4  8.7 - 10.3 mg/dL Final  . AST  92/75/7974 16  8 - 39 U/L Final  . ALT  09/02/2023 9  6 - 57 U/L Final  . Alk Phos (alkaline Phosphatase) 09/02/2023 93  34 - 104 U/L Final  . Albumin  09/02/2023 4.1  3.5 - 4.8 g/dL Final  . Bilirubin, Total 09/02/2023 0.5  0.3 - 1.2 mg/dL Final  . Protein, Total 09/02/2023 6.8  6.1 - 7.9 g/dL Final  . A/G Ratio 92/75/7974 1.5  1.0 - 5.0 gm/dL Final    PHQ 2/9 last 3 flowsheet values     11/11/2021    3:17 PM 01/20/2023    1:36 PM  PHQ-2/9 Depression Screening   Little interest or pleasure in doing things  0  Feeling down, depressed, or hopeless  0  Patient Health Questionnaire-2 Score  0 *  (OBSOLETE) Little interest or pleasure in doing things 0   (OBSOLETE) Feeling down, depressed, or hopeless (or irritable for Teens only)? 0   (OBSOLETE) Total Prescreening Score 0   (OBSOLETE) Total Score = 0     * Data saved with a previous flowsheet row definition     Depression Severity and Treatment Recommendations:  0-4= None  5-9= Mild / Treatment: Support, educate to call if worse; return in one month  10-14= Moderate / Treatment: Support, watchful waiting; Antidepressant or Psychotherapy  15-19= Moderately severe / Treatment: Antidepressant OR Psychotherapy  >= 20 = Major depression, severe / Antidepressant AND Psychotherapy   Goals     . Follow my doctor's care plan       CT neck 6/23 PRESSION:  1. No acute brain injury.  2. No acute cervical spine injury.   3. Moderate spondylosis of the cervical spine with multilevel disc  disease and multilevel neural foraminal narrowing as described.  Minimal canal stenosis at the C4-5 level.   Assessment  Alvin Gonzalez. is a 62 y.o. male here for followup.  He has a complicated medical history including hypertension, BPH (previously admitted with obstruction in 2023 and acute kidney injury), PTSD, depression, anxiety, chronic back and hip pain after an injury in 2016 when he fell from a tree, polysubstance abuse, tobacco use, prediabetes and COPD.  He has social issues including being on parole  Chief Complaint  Patient presents with  . Follow-up    Recommendations Neck fluid collection following carotid endarterectomy surgery-extensive evaluation reviewed.  He was seen at [facility]  but says he does not want to go back there.  He is also seen he does not  want to go back to see Dr. Tedra.  He was strongly advised that he to follow-up with vascular surgery.  I placed another referral to Dr. Marea and advised him to get in there ASAP.   He will finish the Keflex .  If he develops any worsening symptoms he will call to be seen or go to the ER.  Lacunar infarct-2025 He has residual mild right-sided hand and foot numbness and tingling.  He has many risk factors including most notably tobacco and cocaine use. S/p  aspirin  and Plavix  for 21 days and now  just aspirin  indefinitely.  He has been referred back to neurology for follow-up as he was not scheduled at discharge.  We discussed signs of recurrent stroke.  We discussed need for improved blood pressure and lipid control.  HTN-his meds were changed while in the hospital.  He is off lisinopril  and on amlodipine  and hydrochlorothiazide  25 mg.  Main issue however is his continued cocaine use.   Lab Results  Component Value Date   CREATININE 1.1 09/02/2023   BUN 16 09/02/2023   NA 141 09/02/2023   K 3.4 (L) 09/02/2023   CL 103 09/02/2023   CO2  32.0 09/02/2023   Tobacco abuse- 1/2 ppd. Discussed cessation but he declines medication.  He says he will never quit smoking.  Lung cancer screening - referred in past but not done yet  BPH- follows with urology and on flomax . Previously had an admission for obstruction and acute kidney injury.  Seems relatively stable currently.  Hx PTSD, Depression, anxiety - asks regarding psychiatry referral. He tells me he has seen psych before and was suggested to start meds. He has been seen at [facility] but says they are too disorganized. Info given to patient regarding local providers.  If he has worsening he will go to see [facility].  Prediabetes- A1c was 5.9 in past but glucose nml No results found for: HGBA1C, HBA1C  Chronic back and hip pain-referral made to physiatry.   MRI of his T-spine  GERD-continue PPI.  Polysubstance abuse- offered referral to substance abuse counseling but he declines- says has done it once himself and wants to do it himself.  Does continue to use cocaine and marijuana.   *Some images could not be shown.

## 2023-09-17 ENCOUNTER — Other Ambulatory Visit: Payer: Self-pay

## 2023-09-17 MED ORDER — CEPHALEXIN 500 MG PO CAPS
500.0000 mg | ORAL_CAPSULE | Freq: Two times a day (BID) | ORAL | 0 refills | Status: AC
  Filled 2023-09-17: qty 28, 14d supply, fill #0

## 2023-09-24 ENCOUNTER — Telehealth (INDEPENDENT_AMBULATORY_CARE_PROVIDER_SITE_OTHER): Payer: Self-pay | Admitting: Nurse Practitioner

## 2023-09-24 NOTE — Telephone Encounter (Signed)
 Due to the ongoing referral with [facility], I called patient to see if he was needing to come into the office. According to [facility], patient MAY have an infection. We have been trying to obtain reason for referral since September 13, 2023. I spoke with Jori today who stated that the nurse would be going and talking to Dr. Epifanio. I never heard back so I gave a call and left a message with patient's mother to have patient call us  back.

## 2023-09-27 ENCOUNTER — Other Ambulatory Visit: Payer: Self-pay

## 2023-09-27 MED ORDER — AMLODIPINE BESYLATE 10 MG PO TABS
10.0000 mg | ORAL_TABLET | Freq: Every day | ORAL | 1 refills | Status: AC
  Filled 2023-09-27: qty 90, 90d supply, fill #0

## 2023-09-27 MED ORDER — PANTOPRAZOLE SODIUM 40 MG PO TBEC
40.0000 mg | DELAYED_RELEASE_TABLET | Freq: Every day | ORAL | 0 refills | Status: AC
  Filled 2023-09-27: qty 90, 90d supply, fill #0

## 2023-09-28 ENCOUNTER — Ambulatory Visit (INDEPENDENT_AMBULATORY_CARE_PROVIDER_SITE_OTHER): Admitting: Vascular Surgery

## 2023-09-28 ENCOUNTER — Encounter (INDEPENDENT_AMBULATORY_CARE_PROVIDER_SITE_OTHER): Payer: Self-pay | Admitting: Vascular Surgery

## 2023-09-28 VITALS — BP 138/83 | HR 72 | Resp 18 | Ht 65.5 in | Wt 127.2 lb

## 2023-09-28 DIAGNOSIS — F14222 Cocaine dependence with intoxication with perceptual disturbance: Secondary | ICD-10-CM

## 2023-09-28 DIAGNOSIS — I6521 Occlusion and stenosis of right carotid artery: Secondary | ICD-10-CM

## 2023-09-28 DIAGNOSIS — F1490 Cocaine use, unspecified, uncomplicated: Secondary | ICD-10-CM

## 2023-09-28 DIAGNOSIS — I10 Essential (primary) hypertension: Secondary | ICD-10-CM

## 2023-09-28 NOTE — Progress Notes (Unsigned)
 Patient ID: Alvin Gonzalez., male   DOB: 11-10-1960, 63 y.o.   MRN: [id number]  No chief complaint on file.   HPI Alvin Gonzalez. is a 63 y.o. male.  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, with delusions and tactile/visual hallucinations 04/20/2022   COPD (chronic obstructive pulmonary disease) (HCC) 05/27/2023   Depression    Diastolic dysfunction    Emphysema of lung (HCC)    Erectile dysfunction 03/26/2017   GERD (gastroesophageal reflux disease)    Hemorrhoids    HLD (hyperlipidemia)    Hypertension    Intermittent explosive disorder    Left sided lacunar infarction (HCC) 05/18/2023   a.) CT head 05/18/2023: acute LEFT thalamocapsular lacunar infarction   Polysubstance abuse (HCC)    a.) cocaine + THC + opioids + tobacco   Prediabetes 02/20/2021   PTSD (post-traumatic stress disorder)    Pulmonary nodule 12/10/2016   Right-sided carotid artery disease (HCC)    a.) CTA head/neck 05/27/2023: 80% origin RICA   Risk for falls    Stenosis of right carotid artery 05/28/2023   Suicide attempt (HCC)    prior to 02/18/14 admission     Past Surgical History:  Procedure Laterality Date   CHEST TUBE INSERTION     COLONOSCOPY WITH PROPOFOL  N/A 05/14/2021   Procedure: COLONOSCOPY WITH PROPOFOL ;  Surgeon: Therisa Bi, MD;  Location: [facility] ENDOSCOPY;  Service: Gastroenterology;  Laterality: N/A;   ENDARTERECTOMY Right  08/26/2023   Procedure: ENDARTERECTOMY, CAROTID;  Surgeon: Marea Selinda RAMAN, MD;  Location: [facility] ORS;  Service: Vascular;  Laterality: Right;   ENDARTERECTOMY Right 08/28/2023   Procedure: exacuation of hematoma and repair of arterial branch;  Surgeon: Tisa Curry LABOR, MD;  Location: [facility] ORS;  Service: Vascular;  Laterality: Right;   EVALUATION UNDER ANESTHESIA WITH HEMORRHOIDECTOMY N/A 09/10/2021   Procedure: EXAM UNDER ANESTHESIA WITH HEMORRHOIDECTOMY of 2+ columns;  Surgeon: Desiderio Schanz, MD;  Location: [facility] ORS;  Service: General;  Laterality: N/A;  Provider requesting 1.5 hours / 90 minutes for procedure.   LYMPH NODE BIOPSY        Allergies  Allergen Reactions   Carbamazepine Rash and Hives   Ibuprofen Nausea And Vomiting, Rash and Other (See Comments)    GI Bleeding    Risperidone  Swelling    Tongue swelling   Naproxen  Other (See Comments)    GI bleeding    Current Outpatient Medications  Medication Sig Dispense Refill   albuterol  (VENTOLIN  HFA) 108 (90 Base) MCG/ACT inhaler Inhale 2 inhalations into the lungs every 6 (six) hours as needed 6.7 g 11   albuterol  (VENTOLIN  HFA) 108 (90 Base) MCG/ACT inhaler Inhale 2 puffs into the lungs every 6 (six) hours as needed for wheezing. 18 g 2   amLODipine  (NORVASC ) 10 MG tablet Take 1 tablet (10 mg total) by mouth daily. 90 tablet 0   amLODipine  (NORVASC ) 10 MG tablet Take 1 tablet (  10 mg total) by mouth daily. 90 tablet 1   aspirin  EC 81 MG tablet Take 1 tablet (81 mg total) by mouth daily. Swallow whole. 150 tablet 2   atorvastatin  (LIPITOR) 80 MG tablet Take 1 tablet (80 mg total) by mouth daily. (Patient not taking: Reported on 08/23/2023) 30 tablet 0   cephALEXin  (KEFLEX ) 500 MG capsule Take 1 capsule (500 mg total) by mouth 2 (two) times daily for 14 days 28 capsule 0   cyclobenzaprine  (FLEXERIL ) 10 MG tablet Take 1 tablet (10 mg total) by mouth 3 (three) times daily as needed for muscle spasms. 30 tablet 0   hydrochlorothiazide   (HYDRODIURIL ) 25 MG tablet Take 1 tablet by mouth daily.     oxyCODONE  (OXY IR/ROXICODONE ) 5 MG immediate release tablet Take 1-2 tablets (5-10 mg total) by mouth every 4 (four) hours as needed for moderate pain (pain score 4-6). 20 tablet 0   oxyCODONE -acetaminophen  (PERCOCET/ROXICET) 5-325 MG tablet Take 1 tablet by mouth every 6 (six) hours as needed. 20 tablet 0   pantoprazole  (PROTONIX ) 40 MG tablet Take 1 tablet (40 mg total) by mouth daily. 90 tablet 0   No current facility-administered medications for this visit.        Physical Exam There were no vitals taken for this visit. Gen:  WD/WN, NAD Skin: incision C/D/I     Assessment/Plan:  No problem-specific Assessment & Plan notes found for this encounter.      [name] 09/28/2023, 10:36 AM   This note was created with Dragon medical transcription system.  Any errors from dictation are unintentional.

## 2023-09-29 ENCOUNTER — Ambulatory Visit: Admission: RE | Admit: 2023-09-29 | Source: Ambulatory Visit

## 2023-09-29 ENCOUNTER — Other Ambulatory Visit: Payer: Self-pay | Admitting: Family Medicine

## 2023-09-29 DIAGNOSIS — G959 Disease of spinal cord, unspecified: Secondary | ICD-10-CM

## 2023-09-29 NOTE — Assessment & Plan Note (Signed)
 blood pressure control important in reducing the progression of atherosclerotic disease. On appropriate oral medications.  Marked hypertension was one of the primary reasons he developed a neck hematoma postoperatively.

## 2023-09-29 NOTE — Assessment & Plan Note (Signed)
 Status post right carotid endarterectomy.  Had hematoma postoperatively after he left AMA with marked hypertension.  Had evacuation but still has resolving hematoma in the right neck.  With his difficulty swallowing, going to refer him to ENT.  He apparently saw ENT at [facility] but said he could not tolerate any visualization with the scope through his nose at that time.  I would like ENT here to evaluate him to determine if anything further needs to be done although I think this is improving as his neck hematoma resolves.  Will plan to see him back in 2 to 3 months with carotid duplex.

## 2023-09-29 NOTE — Assessment & Plan Note (Signed)
 Certainly worsens hypertension and suspicion would be present that this may have been part of the cause of his postoperative neck hematoma.

## 2023-09-29 NOTE — Assessment & Plan Note (Deleted)
 Certainly worsens hypertension and suspicion would be present that this may have been part of the cause of his postoperative neck hematoma.

## 2023-10-01 ENCOUNTER — Ambulatory Visit
Admission: RE | Admit: 2023-10-01 | Discharge: 2023-10-01 | Disposition: A | Discharge status: Home / Self Care | Source: Ambulatory Visit | Attending: Family Medicine | Admitting: Family Medicine

## 2023-10-01 DIAGNOSIS — G959 Disease of spinal cord, unspecified: Secondary | ICD-10-CM

## 2023-10-04 ENCOUNTER — Other Ambulatory Visit: Payer: Self-pay | Admitting: Family Medicine

## 2023-10-04 ENCOUNTER — Inpatient Hospital Stay
Admission: RE | Admit: 2023-10-04 | Discharge: 2023-10-04 | Disposition: A | Discharge status: Home / Self Care | Payer: Self-pay | Source: Ambulatory Visit | Attending: Orthopedic Surgery | Admitting: Orthopedic Surgery

## 2023-10-04 ENCOUNTER — Other Ambulatory Visit: Payer: Self-pay

## 2023-10-04 DIAGNOSIS — Z049 Encounter for examination and observation for unspecified reason: Secondary | ICD-10-CM

## 2023-10-04 MED ORDER — HYDROCHLOROTHIAZIDE 25 MG PO TABS
25.0000 mg | ORAL_TABLET | Freq: Every day | ORAL | 3 refills | Status: AC
  Filled 2023-10-04: qty 90, 90d supply, fill #0

## 2023-10-07 ENCOUNTER — Other Ambulatory Visit: Payer: Self-pay

## 2023-10-08 ENCOUNTER — Other Ambulatory Visit: Payer: Self-pay | Admitting: Infectious Diseases

## 2023-10-08 DIAGNOSIS — L0211 Cutaneous abscess of neck: Secondary | ICD-10-CM

## 2023-10-08 DIAGNOSIS — Z9889 Other specified postprocedural states: Secondary | ICD-10-CM

## 2023-10-08 NOTE — Progress Notes (Deleted)
 Referring Physician:  Harvey Gaetana CROME, NP [address] [place],  [place] [address]  Primary Physician:  Alvin Alm SQUIBB, MD  History of Present Illness: 10/08/2023*** [name] has a history of HTN, stroke, COPD, cocaine use/polysubstance abuse, GERD, prediabetes, chronic pain, PTSD, BPH,   He saw PMR (Alvin Gonzalez) on 09/29/23- she was concerned for cervical myelopathy. STAT MRI of cervical spine ordered and has not been done.   He is s/p carotid endarterectomy 08/26/23  and developed large hematoma that needed washed out on 08/29/23. Saw Dr. Marea on 09/28/23 and he was referred to ENT.     LBP that goes into the left leg?  Duration: *** Location: *** Quality: *** Severity: ***  Precipitating: aggravated by *** Modifying factors: made better by *** Weakness: none Timing: ***  Tobacco use: smokes 1 PPD x *** years.   Bowel/Bladder Dysfunction: none  Conservative measures:  Physical therapy: *** has not participated in? Multimodal medical therapy including regular antiinflammatories: *** flexeril , meloxicam , oxycodone ,  Injections: *** no epidural steroid injections??  Past Surgery: ***no spinal surgeries   Alvin Gonzalez. has ***no symptoms of cervical myelopathy.  The symptoms are causing a significant impact on the patient's life.   Review of Systems:  A 10 point review of systems is negative, except for the pertinent positives and negatives detailed in the HPI.  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, with delusions and tactile/visual hallucinations 04/20/2022   COPD (chronic obstructive pulmonary disease) (HCC) 05/27/2023   Depression    Diastolic dysfunction    Emphysema of lung (HCC)    Erectile dysfunction 03/26/2017   GERD (gastroesophageal reflux disease)    Hemorrhoids    HLD (hyperlipidemia)    Hypertension    Intermittent explosive disorder    Left sided lacunar infarction (HCC) 05/18/2023   a.) CT head 05/18/2023: acute LEFT thalamocapsular lacunar infarction   Polysubstance abuse (HCC)    a.) cocaine + THC + opioids + tobacco   Prediabetes 02/20/2021   PTSD (post-traumatic stress disorder)    Pulmonary nodule 12/10/2016   Right-sided carotid artery disease (HCC)    a.) CTA head/neck 05/27/2023: 80% origin RICA   Risk for falls    Stenosis of right carotid artery 05/28/2023   Suicide attempt (HCC)    prior to 02/18/14 admission     Past Surgical History: Past Surgical History:  Procedure Laterality Date   CHEST TUBE INSERTION     COLONOSCOPY WITH PROPOFOL  N/A 05/14/2021   Procedure: COLONOSCOPY WITH PROPOFOL ;  Surgeon: Alvin Bi, MD;  Location: [facility] ENDOSCOPY;  Service: Gastroenterology;  Laterality: N/A;   ENDARTERECTOMY Right 08/26/2023   Procedure: ENDARTERECTOMY, CAROTID;  Surgeon: Alvin Selinda RAMAN, MD;  Location: [facility] ORS;  Service: Vascular;  Laterality: Right;   ENDARTERECTOMY Right 08/28/2023   Procedure: exacuation of hematoma and repair of arterial branch;  Surgeon: Alvin Gonzalez LABOR, MD;  Location: [facility] ORS;  Service: Vascular;  Laterality: Right;   EVALUATION UNDER ANESTHESIA WITH HEMORRHOIDECTOMY N/A 09/10/2021   Procedure: EXAM UNDER ANESTHESIA WITH HEMORRHOIDECTOMY of 2+ columns;  Surgeon: Alvin Schanz, MD;  Location: [facility] ORS;  Service: General;  Laterality: N/A;  Provider requesting 1.5 hours / 90 minutes for procedure.   LYMPH NODE BIOPSY       Allergies: Allergies as of 10/14/2023 - Review Complete 09/28/2023  Allergen Reaction Noted   Carbamazepine Rash and Hives 10/27/2013   Ibuprofen Nausea And Vomiting, Rash, and Other (See Comments) 01/18/2013   Risperidone  Swelling 06/19/2016   Naproxen  Other (See Comments) 08/24/2016    Medications: Outpatient Encounter Medications as of 10/14/2023  Medication Sig   albuterol  (VENTOLIN  HFA) 108 (90 Base) MCG/ACT inhaler Inhale 2 inhalations into the lungs every 6 (six) hours as needed   albuterol  (VENTOLIN  HFA) 108 (90 Base) MCG/ACT inhaler Inhale 2 puffs into the lungs every 6 (six) hours as needed for wheezing.   amLODipine  (NORVASC ) 10 MG tablet Take 1 tablet (10 mg total) by mouth daily.   amLODipine  (NORVASC ) 10 MG tablet Take 1 tablet (10 mg total) by mouth daily.   aspirin  EC 81 MG tablet Take 1 tablet (81 mg total) by mouth daily. Swallow whole.   atorvastatin  (LIPITOR) 80 MG tablet Take 1 tablet (80 mg total) by mouth daily. (Patient not taking: Reported on 09/28/2023)   cyclobenzaprine  (FLEXERIL ) 10 MG tablet Take 1 tablet (10 mg total) by mouth 3 (three) times daily as needed for muscle spasms.   hydrochlorothiazide  (HYDRODIURIL ) 25 MG tablet Take 1 tablet by mouth daily.   hydrochlorothiazide  (HYDRODIURIL ) 25 MG tablet Take 1 tablet (25 mg total) by mouth once daily   oxyCODONE  (OXY IR/ROXICODONE ) 5 MG immediate release tablet Take 1-2 tablets (5-10 mg total) by mouth every 4 (four) hours as needed for moderate pain (pain score 4-6).   oxyCODONE -acetaminophen  (PERCOCET/ROXICET) 5-325 MG tablet Take 1 tablet by mouth every 6 (six) hours as needed.   pantoprazole  (PROTONIX ) 40 MG tablet Take 1 tablet (40 mg total) by mouth daily.   [DISCONTINUED] DULoxetine  (CYMBALTA ) 30 MG capsule Take 1 capsule (30 mg total) by mouth daily for 7 days, THEN 2 capsules (60 mg total) daily   No facility-administered encounter medications on file as of 10/14/2023.    Social History: Social  History   Tobacco Use   Smoking status: Every Day    Current packs/day: 1.00    Average packs/day: 1 pack/day for 45.0 years (45.0 ttl pk-yrs)    Types: Cigarettes   Smokeless tobacco: Former    Quit date: 01/30/2015  Vaping Use   Vaping status: Former   Quit date: 01/23/2017  Substance Use Topics   Alcohol use: Not Currently    Comment: occ   Drug use: Yes    Types: Marijuana, Cocaine    Comment: special occasions , last use 05/19/21. uses cocaine, last use 04/17/22    Family Medical History: Family History  Problem Relation Age of Onset   Diabetes Mother    Hypertension Mother    Other Mother        possible ovarian cancer   Heart disease Father    Hypertension Father    Cirrhosis Father    Liver cancer Father    Lung cancer Sister    Heart attack Maternal Grandmother    Suicidality Maternal Grandfather    Other Paternal Grandmother        unknown medical history   Suicidality Paternal Grandfather  Stomach cancer Cousin     Physical Examination: There were no vitals filed for this visit.  General: Patient is well developed, well nourished, calm, collected, and in no apparent distress. Attention to examination is appropriate.  Respiratory: Patient is breathing without any difficulty.   NEUROLOGICAL:     Awake, alert, oriented to person, place, and time.  Speech is clear and fluent. Fund of knowledge is appropriate.   Cranial Nerves: Pupils equal round and reactive to light.  Facial tone is symmetric.    *** ROM of cervical spine *** pain *** posterior cervical tenderness. *** tenderness in bilateral trapezial region.   *** ROM of lumbar spine *** pain *** posterior lumbar tenderness.   No abnormal lesions on exposed skin.   Strength: Side Biceps Triceps Deltoid Interossei Grip Wrist Ext. Wrist Flex.  R 5 5 5 5 5 5 5   L 5 5 5 5 5 5 5    Side Iliopsoas Quads Hamstring PF DF EHL  R 5 5 5 5 5 5   L 5 5 5 5 5 5    Reflexes are ***2+ and symmetric at the  biceps, brachioradialis, patella and achilles.   Hoffman's is absent.  Clonus is not present.   Bilateral upper and lower extremity sensation is intact to light touch.     Gait is normal.   ***No difficulty with tandem gait.    Medical Decision Making  Imaging: Lumbar xrays dated 05/16/23:  FINDINGS: Five non-rib-bearing lumbar vertebra. Chronic anterior wedging of T12 and L1. No evidence of acute fracture. Trace retrolisthesis of L1 on L2 and L2 on L3, unchanged from prior. Space narrowing and anterior spurring T12-L1, L1-L2, and L2-L3. Mild multilevel facet hypertrophy. No visible pars defects or focal bone abnormality. Sacroiliac joints are congruent.   IMPRESSION: 1. No acute findings. 2. Mild multilevel degenerative disc disease and facet hypertrophy. 3. Chronic anterior wedging of T12 and L1.     Electronically Signed   By: Andrea Gasman M.D.   On: 05/16/2023 18:11    I have personally reviewed the images and agree with the above interpretation.  Assessment and Plan: Mr. Hoos is a pleasant 63 y.o. male has ***  Treatment options discussed with patient and following plan made:   - Order for physical therapy for *** spine ***. Patient to call to schedule appointment. *** - Continue current medications including ***. Reviewed dosing and side effects.  - Prescription for ***. Reviewed dosing and side effects. Take with food.  - Prescription for *** to take prn muscle spasms. Reviewed dosing and side effects. Discussed this can cause drowsiness.  - MRI of *** to further evaluate *** radiculopathy. No improvement time or medications (***).  - Referral to PMR at [facility] to discuss possible *** injections.  - Will schedule phone visit to review MRI results once I get them back.   I spent a total of *** minutes in face-to-face and non-face-to-face activities related to this patient's care today including review of outside records, review of imaging, review of symptoms, physical  exam, discussion of differential diagnosis, discussion of treatment options, and documentation.   Thank you for involving me in the care of this patient.   Glade Boys PA-C Dept. of Neurosurgery

## 2023-10-14 ENCOUNTER — Ambulatory Visit: Admitting: Orthopedic Surgery

## 2023-10-22 ENCOUNTER — Ambulatory Visit
Admission: RE | Admit: 2023-10-22 | Discharge: 2023-10-22 | Disposition: A | Discharge status: Home / Self Care | Source: Ambulatory Visit | Attending: Infectious Diseases | Admitting: Infectious Diseases

## 2023-10-22 DIAGNOSIS — Z9889 Other specified postprocedural states: Secondary | ICD-10-CM | POA: Insufficient documentation

## 2023-10-22 DIAGNOSIS — L0211 Cutaneous abscess of neck: Secondary | ICD-10-CM | POA: Insufficient documentation

## 2023-11-30 ENCOUNTER — Encounter (INDEPENDENT_AMBULATORY_CARE_PROVIDER_SITE_OTHER)

## 2023-11-30 ENCOUNTER — Ambulatory Visit (INDEPENDENT_AMBULATORY_CARE_PROVIDER_SITE_OTHER): Admitting: Nurse Practitioner
